# Patient Record
Sex: Female | Born: 1935 | Race: White | Hispanic: No | Smoking: Never smoker
Health system: Southern US, Community
[De-identification: ages and names within clinical notes are randomized; demographics above are authoritative.]

## PROBLEM LIST (undated history)

## (undated) DIAGNOSIS — E669 Obesity, unspecified: Secondary | ICD-10-CM

## (undated) DIAGNOSIS — N189 Chronic kidney disease, unspecified: Secondary | ICD-10-CM

## (undated) DIAGNOSIS — I2510 Atherosclerotic heart disease of native coronary artery without angina pectoris: Secondary | ICD-10-CM

## (undated) DIAGNOSIS — I509 Heart failure, unspecified: Secondary | ICD-10-CM

## (undated) DIAGNOSIS — K922 Gastrointestinal hemorrhage, unspecified: Secondary | ICD-10-CM

## (undated) DIAGNOSIS — I480 Paroxysmal atrial fibrillation: Secondary | ICD-10-CM

## (undated) DIAGNOSIS — G8929 Other chronic pain: Secondary | ICD-10-CM

## (undated) DIAGNOSIS — Z6841 Body Mass Index (BMI) 40.0 and over, adult: Secondary | ICD-10-CM

## (undated) DIAGNOSIS — I739 Peripheral vascular disease, unspecified: Secondary | ICD-10-CM

## (undated) DIAGNOSIS — F329 Major depressive disorder, single episode, unspecified: Secondary | ICD-10-CM

## (undated) DIAGNOSIS — G4733 Obstructive sleep apnea (adult) (pediatric): Secondary | ICD-10-CM

## (undated) DIAGNOSIS — M1990 Unspecified osteoarthritis, unspecified site: Secondary | ICD-10-CM

## (undated) DIAGNOSIS — I350 Nonrheumatic aortic (valve) stenosis: Secondary | ICD-10-CM

## (undated) DIAGNOSIS — I4892 Unspecified atrial flutter: Secondary | ICD-10-CM

## (undated) DIAGNOSIS — I447 Left bundle-branch block, unspecified: Secondary | ICD-10-CM

## (undated) DIAGNOSIS — G309 Alzheimer's disease, unspecified: Secondary | ICD-10-CM

## (undated) DIAGNOSIS — E1149 Type 2 diabetes mellitus with other diabetic neurological complication: Secondary | ICD-10-CM

## (undated) DIAGNOSIS — I10 Essential (primary) hypertension: Secondary | ICD-10-CM

## (undated) DIAGNOSIS — F0280 Dementia in other diseases classified elsewhere without behavioral disturbance: Secondary | ICD-10-CM

## (undated) DIAGNOSIS — L659 Nonscarring hair loss, unspecified: Secondary | ICD-10-CM

## (undated) DIAGNOSIS — I495 Sick sinus syndrome: Secondary | ICD-10-CM

## (undated) DIAGNOSIS — I5032 Chronic diastolic (congestive) heart failure: Secondary | ICD-10-CM

## (undated) DIAGNOSIS — H9201 Otalgia, right ear: Secondary | ICD-10-CM

## (undated) DIAGNOSIS — E7800 Pure hypercholesterolemia, unspecified: Secondary | ICD-10-CM

## (undated) DIAGNOSIS — D631 Anemia in chronic kidney disease: Secondary | ICD-10-CM

## (undated) DIAGNOSIS — I129 Hypertensive chronic kidney disease with stage 1 through stage 4 chronic kidney disease, or unspecified chronic kidney disease: Secondary | ICD-10-CM

## (undated) DIAGNOSIS — D649 Anemia, unspecified: Secondary | ICD-10-CM

## (undated) HISTORY — DX: Hypertensive chronic kidney disease with stage 1 through stage 4 chronic kidney disease, or unspecified chronic kidney disease: I12.9

## (undated) HISTORY — DX: Anemia in chronic kidney disease: N18.9

## (undated) HISTORY — DX: Left bundle-branch block, unspecified: I44.7

## (undated) HISTORY — DX: Alzheimer's disease, unspecified: G30.9

## (undated) HISTORY — PX: APPENDECTOMY: SHX54

## (undated) HISTORY — DX: Body Mass Index (BMI) 40.0 and over, adult: Z684

## (undated) HISTORY — DX: Obstructive sleep apnea (adult) (pediatric): G47.33

## (undated) HISTORY — DX: Chronic kidney disease, unspecified: D63.1

## (undated) HISTORY — DX: Pure hypercholesterolemia, unspecified: E78.00

## (undated) HISTORY — DX: Morbid (severe) obesity due to excess calories: E66.01

## (undated) HISTORY — DX: Essential (primary) hypertension: I10

## (undated) HISTORY — PX: REPLACEMENT TOTAL KNEE BILATERAL: SUR1225

## (undated) HISTORY — DX: Sick sinus syndrome: I49.5

## (undated) HISTORY — DX: Unspecified atrial flutter: I48.92

## (undated) HISTORY — DX: Atherosclerotic heart disease of native coronary artery without angina pectoris: I25.10

## (undated) HISTORY — DX: Heart failure, unspecified: I50.9

## (undated) HISTORY — DX: Chronic diastolic (congestive) heart failure: I50.32

## (undated) HISTORY — DX: Anemia, unspecified: D64.9

## (undated) HISTORY — DX: Obesity, unspecified: E66.9

## (undated) HISTORY — DX: Nonrheumatic aortic (valve) stenosis: I35.0

## (undated) HISTORY — DX: Paroxysmal atrial fibrillation: I48.0

## (undated) HISTORY — DX: Major depressive disorder, single episode, unspecified: F32.9

## (undated) HISTORY — DX: Gastrointestinal hemorrhage, unspecified: K92.2

## (undated) HISTORY — DX: Nonscarring hair loss, unspecified: L65.9

## (undated) HISTORY — DX: Dementia in other diseases classified elsewhere, unspecified severity, without behavioral disturbance, psychotic disturbance, mood disturbance, and anxiety: F02.80

## (undated) HISTORY — DX: Chronic kidney disease, unspecified: N18.9

## (undated) HISTORY — DX: Type 2 diabetes mellitus with other diabetic neurological complication: E11.49

---

## 1997-09-27 HISTORY — PX: CHOLECYSTECTOMY: SHX55

## 1998-02-12 ENCOUNTER — Ambulatory Visit (HOSPITAL_COMMUNITY): Admission: RE | Admit: 1998-02-12 | Discharge: 1998-02-12 | Payer: Self-pay | Admitting: Family Medicine

## 1998-03-10 ENCOUNTER — Other Ambulatory Visit
Admission: RE | Admit: 1998-03-10 | Discharge: 1998-03-10 | Payer: Self-pay | Admitting: Physical Medicine and Rehabilitation

## 1998-03-12 ENCOUNTER — Inpatient Hospital Stay (HOSPITAL_COMMUNITY): Admission: RE | Admit: 1998-03-12 | Discharge: 1998-03-15 | Payer: Self-pay | Admitting: Orthopedic Surgery

## 1998-03-15 ENCOUNTER — Inpatient Hospital Stay (HOSPITAL_COMMUNITY)
Admission: AD | Admit: 1998-03-15 | Discharge: 1998-03-21 | Payer: Self-pay | Admitting: Physical Medicine and Rehabilitation

## 1999-02-24 ENCOUNTER — Other Ambulatory Visit: Admission: RE | Admit: 1999-02-24 | Discharge: 1999-02-24 | Payer: Self-pay | Admitting: Family Medicine

## 1999-10-29 ENCOUNTER — Encounter: Payer: Self-pay | Admitting: Family Medicine

## 1999-10-29 ENCOUNTER — Encounter: Admission: RE | Admit: 1999-10-29 | Discharge: 1999-10-29 | Payer: Self-pay | Admitting: Family Medicine

## 2000-09-27 HISTORY — PX: CATARACT EXTRACTION: SUR2

## 2000-11-08 ENCOUNTER — Encounter: Admission: RE | Admit: 2000-11-08 | Discharge: 2000-11-08 | Payer: Self-pay | Admitting: Family Medicine

## 2000-11-08 ENCOUNTER — Encounter: Payer: Self-pay | Admitting: Family Medicine

## 2001-11-08 ENCOUNTER — Encounter: Admission: RE | Admit: 2001-11-08 | Discharge: 2001-11-08 | Payer: Self-pay | Admitting: Family Medicine

## 2001-11-08 ENCOUNTER — Encounter: Payer: Self-pay | Admitting: Family Medicine

## 2001-12-04 ENCOUNTER — Other Ambulatory Visit: Admission: RE | Admit: 2001-12-04 | Discharge: 2001-12-04 | Payer: Self-pay | Admitting: Family Medicine

## 2002-03-28 ENCOUNTER — Encounter: Payer: Self-pay | Admitting: Family Medicine

## 2002-03-28 ENCOUNTER — Encounter: Admission: RE | Admit: 2002-03-28 | Discharge: 2002-03-28 | Payer: Self-pay | Admitting: Family Medicine

## 2002-11-20 ENCOUNTER — Encounter: Payer: Self-pay | Admitting: Family Medicine

## 2002-11-20 ENCOUNTER — Encounter: Admission: RE | Admit: 2002-11-20 | Discharge: 2002-11-20 | Payer: Self-pay | Admitting: Family Medicine

## 2002-11-28 ENCOUNTER — Encounter: Payer: Self-pay | Admitting: Family Medicine

## 2002-11-28 ENCOUNTER — Encounter: Admission: RE | Admit: 2002-11-28 | Discharge: 2002-11-28 | Payer: Self-pay | Admitting: Family Medicine

## 2003-05-15 ENCOUNTER — Encounter: Payer: Self-pay | Admitting: Family Medicine

## 2003-05-15 ENCOUNTER — Encounter: Admission: RE | Admit: 2003-05-15 | Discharge: 2003-05-15 | Payer: Self-pay | Admitting: Family Medicine

## 2003-07-10 ENCOUNTER — Encounter: Payer: Self-pay | Admitting: Otolaryngology

## 2003-07-10 ENCOUNTER — Encounter: Admission: RE | Admit: 2003-07-10 | Discharge: 2003-07-10 | Payer: Self-pay | Admitting: Otolaryngology

## 2003-08-06 ENCOUNTER — Encounter: Admission: RE | Admit: 2003-08-06 | Discharge: 2003-08-06 | Payer: Self-pay | Admitting: Otolaryngology

## 2003-12-03 ENCOUNTER — Encounter: Admission: RE | Admit: 2003-12-03 | Discharge: 2003-12-03 | Payer: Self-pay | Admitting: Family Medicine

## 2004-01-30 ENCOUNTER — Ambulatory Visit (HOSPITAL_COMMUNITY): Admission: RE | Admit: 2004-01-30 | Discharge: 2004-01-30 | Payer: Self-pay | Admitting: Orthopedic Surgery

## 2004-02-03 ENCOUNTER — Inpatient Hospital Stay (HOSPITAL_COMMUNITY): Admission: RE | Admit: 2004-02-03 | Discharge: 2004-02-08 | Payer: Self-pay | Admitting: Orthopedic Surgery

## 2004-03-06 ENCOUNTER — Ambulatory Visit (HOSPITAL_COMMUNITY): Admission: RE | Admit: 2004-03-06 | Discharge: 2004-03-06 | Payer: Self-pay | Admitting: Orthopedic Surgery

## 2004-03-06 ENCOUNTER — Emergency Department (HOSPITAL_COMMUNITY): Admission: EM | Admit: 2004-03-06 | Discharge: 2004-03-07 | Payer: Self-pay | Admitting: Emergency Medicine

## 2004-03-06 ENCOUNTER — Ambulatory Visit (HOSPITAL_BASED_OUTPATIENT_CLINIC_OR_DEPARTMENT_OTHER): Admission: RE | Admit: 2004-03-06 | Discharge: 2004-03-06 | Payer: Self-pay | Admitting: Orthopedic Surgery

## 2004-12-08 ENCOUNTER — Encounter: Admission: RE | Admit: 2004-12-08 | Discharge: 2004-12-08 | Payer: Self-pay | Admitting: Family Medicine

## 2005-06-09 ENCOUNTER — Ambulatory Visit (HOSPITAL_COMMUNITY): Admission: RE | Admit: 2005-06-09 | Discharge: 2005-06-09 | Payer: Self-pay | Admitting: Gastroenterology

## 2005-06-09 ENCOUNTER — Encounter (INDEPENDENT_AMBULATORY_CARE_PROVIDER_SITE_OTHER): Payer: Self-pay | Admitting: *Deleted

## 2005-08-04 ENCOUNTER — Ambulatory Visit (HOSPITAL_COMMUNITY): Admission: RE | Admit: 2005-08-04 | Discharge: 2005-08-04 | Payer: Self-pay | Admitting: Gastroenterology

## 2005-09-27 HISTORY — PX: CLOSED REDUCTION FEMORAL SHAFT FRACTURE: SUR217

## 2005-11-08 ENCOUNTER — Ambulatory Visit: Payer: Self-pay | Admitting: Hematology & Oncology

## 2005-12-03 ENCOUNTER — Encounter: Admission: RE | Admit: 2005-12-03 | Discharge: 2005-12-03 | Payer: Self-pay | Admitting: Orthopedic Surgery

## 2005-12-05 ENCOUNTER — Encounter: Admission: RE | Admit: 2005-12-05 | Discharge: 2005-12-05 | Payer: Self-pay | Admitting: Orthopedic Surgery

## 2005-12-13 ENCOUNTER — Encounter: Admission: RE | Admit: 2005-12-13 | Discharge: 2005-12-13 | Payer: Self-pay | Admitting: Family Medicine

## 2005-12-24 ENCOUNTER — Ambulatory Visit: Payer: Self-pay | Admitting: Hematology & Oncology

## 2006-01-20 LAB — CBC & DIFF AND RETIC
BASO%: 0.8 % (ref 0.0–2.0)
EOS%: 5.3 % (ref 0.0–7.0)
HCT: 33.9 % — ABNORMAL LOW (ref 34.8–46.6)
LYMPH%: 29.1 % (ref 14.0–48.0)
MCH: 29.5 pg (ref 26.0–34.0)
MCHC: 33.8 g/dL (ref 32.0–36.0)
NEUT%: 52.6 % (ref 39.6–76.8)
Platelets: 182 10*3/uL (ref 145–400)
lymph#: 1.6 10*3/uL (ref 0.9–3.3)

## 2006-01-20 LAB — FERRITIN: Ferritin: 65 ng/mL (ref 10–291)

## 2006-01-26 ENCOUNTER — Encounter: Admission: RE | Admit: 2006-01-26 | Discharge: 2006-01-26 | Payer: Self-pay | Admitting: Family Medicine

## 2006-02-05 ENCOUNTER — Emergency Department (HOSPITAL_COMMUNITY): Admission: EM | Admit: 2006-02-05 | Discharge: 2006-02-05 | Payer: Self-pay | Admitting: Family Medicine

## 2006-02-15 ENCOUNTER — Ambulatory Visit: Payer: Self-pay | Admitting: Hematology & Oncology

## 2006-02-17 LAB — CBC & DIFF AND RETIC
Eosinophils Absolute: 0.2 10*3/uL (ref 0.0–0.5)
MCV: 87.7 fL (ref 81.0–101.0)
MONO%: 11.9 % (ref 0.0–13.0)
NEUT#: 2.5 10*3/uL (ref 1.5–6.5)
RBC: 3.86 10*6/uL (ref 3.70–5.32)
RDW: 15.5 % — ABNORMAL HIGH (ref 11.3–14.5)
RETIC #: 70.3 10*3/uL (ref 19.7–115.1)
Retic %: 1.8 % (ref 0.4–2.3)
WBC: 4.8 10*3/uL (ref 3.9–10.0)
lymph#: 1.5 10*3/uL (ref 0.9–3.3)

## 2006-03-17 LAB — CBC WITH DIFFERENTIAL/PLATELET
BASO%: 0.6 % (ref 0.0–2.0)
Basophils Absolute: 0 10*3/uL (ref 0.0–0.1)
EOS%: 5.1 % (ref 0.0–7.0)
MCH: 30.1 pg (ref 26.0–34.0)
MCHC: 34 g/dL (ref 32.0–36.0)
MCV: 88.4 fL (ref 81.0–101.0)
MONO%: 11.4 % (ref 0.0–13.0)
RBC: 3.95 10*6/uL (ref 3.70–5.32)
RDW: 16.3 % — ABNORMAL HIGH (ref 11.3–14.5)

## 2006-03-27 ENCOUNTER — Inpatient Hospital Stay (HOSPITAL_COMMUNITY): Admission: EM | Admit: 2006-03-27 | Discharge: 2006-04-14 | Payer: Self-pay | Admitting: Emergency Medicine

## 2006-03-28 ENCOUNTER — Encounter (INDEPENDENT_AMBULATORY_CARE_PROVIDER_SITE_OTHER): Payer: Self-pay | Admitting: Cardiology

## 2006-04-01 ENCOUNTER — Encounter (HOSPITAL_BASED_OUTPATIENT_CLINIC_OR_DEPARTMENT_OTHER): Payer: Self-pay | Admitting: General Surgery

## 2006-04-04 ENCOUNTER — Ambulatory Visit: Payer: Self-pay | Admitting: Physical Medicine & Rehabilitation

## 2006-04-11 ENCOUNTER — Ambulatory Visit: Payer: Self-pay | Admitting: Hematology & Oncology

## 2006-06-02 ENCOUNTER — Ambulatory Visit: Payer: Self-pay | Admitting: Hematology & Oncology

## 2006-06-06 LAB — CBC WITH DIFFERENTIAL/PLATELET
EOS%: 3.4 % (ref 0.0–7.0)
Eosinophils Absolute: 0.1 10*3/uL (ref 0.0–0.5)
MCV: 89.7 fL (ref 81.0–101.0)
MONO%: 13.4 % — ABNORMAL HIGH (ref 0.0–13.0)
NEUT#: 2.2 10*3/uL (ref 1.5–6.5)
RBC: 2.89 10*6/uL — ABNORMAL LOW (ref 3.70–5.32)
RDW: 17.8 % — ABNORMAL HIGH (ref 11.3–14.5)
lymph#: 1.4 10*3/uL (ref 0.9–3.3)

## 2006-06-08 LAB — TRANSFERRIN RECEPTOR, SOLUABLE: Transferrin Receptor, Soluble: 7.9 mg/L — ABNORMAL HIGH (ref 1.9–4.4)

## 2006-06-08 LAB — FERRITIN: Ferritin: 66 ng/mL (ref 10–291)

## 2006-06-13 LAB — CBC WITH DIFFERENTIAL/PLATELET
BASO%: 1.6 % (ref 0.0–2.0)
EOS%: 4.4 % (ref 0.0–7.0)
MCH: 29.5 pg (ref 26.0–34.0)
MCHC: 32.2 g/dL (ref 32.0–36.0)
RDW: 16.4 % — ABNORMAL HIGH (ref 11.3–14.5)
lymph#: 1.5 10*3/uL (ref 0.9–3.3)

## 2006-06-20 LAB — CBC WITH DIFFERENTIAL/PLATELET
BASO%: 0.7 % (ref 0.0–2.0)
Basophils Absolute: 0 10*3/uL (ref 0.0–0.1)
EOS%: 2.1 % (ref 0.0–7.0)
HCT: 29 % — ABNORMAL LOW (ref 34.8–46.6)
HGB: 9.7 g/dL — ABNORMAL LOW (ref 11.6–15.9)
LYMPH%: 27.6 % (ref 14.0–48.0)
MCH: 30.8 pg (ref 26.0–34.0)
MCHC: 33.5 g/dL (ref 32.0–36.0)
MCV: 91.9 fL (ref 81.0–101.0)
MONO%: 14.8 % — ABNORMAL HIGH (ref 0.0–13.0)
NEUT%: 54.8 % (ref 39.6–76.8)

## 2006-08-01 ENCOUNTER — Ambulatory Visit: Payer: Self-pay | Admitting: Hematology & Oncology

## 2006-08-03 LAB — CBC WITH DIFFERENTIAL/PLATELET
BASO%: 1.6 % (ref 0.0–2.0)
Basophils Absolute: 0.1 10*3/uL (ref 0.0–0.1)
EOS%: 3.4 % (ref 0.0–7.0)
HGB: 11.2 g/dL — ABNORMAL LOW (ref 11.6–15.9)
MCH: 29 pg (ref 26.0–34.0)
MCHC: 33.4 g/dL (ref 32.0–36.0)
MCV: 87 fL (ref 81.0–101.0)
MONO%: 11.9 % (ref 0.0–13.0)
RBC: 3.88 10*6/uL (ref 3.70–5.32)
RDW: 14.1 % (ref 11.3–14.5)

## 2006-08-31 LAB — CBC WITH DIFFERENTIAL/PLATELET
BASO%: 0.6 % (ref 0.0–2.0)
Basophils Absolute: 0 10*3/uL (ref 0.0–0.1)
Eosinophils Absolute: 0.1 10*3/uL (ref 0.0–0.5)
HCT: 33.1 % — ABNORMAL LOW (ref 34.8–46.6)
HGB: 11.2 g/dL — ABNORMAL LOW (ref 11.6–15.9)
LYMPH%: 29.6 % (ref 14.0–48.0)
MONO#: 0.5 10*3/uL (ref 0.1–0.9)
NEUT#: 2.8 10*3/uL (ref 1.5–6.5)
NEUT%: 57.5 % (ref 39.6–76.8)
Platelets: 189 10*3/uL (ref 145–400)
WBC: 4.9 10*3/uL (ref 3.9–10.0)
lymph#: 1.4 10*3/uL (ref 0.9–3.3)

## 2006-09-22 ENCOUNTER — Ambulatory Visit: Payer: Self-pay | Admitting: Hematology & Oncology

## 2006-09-28 LAB — CBC WITH DIFFERENTIAL/PLATELET
Basophils Absolute: 0.1 10*3/uL (ref 0.0–0.1)
Eosinophils Absolute: 0.2 10*3/uL (ref 0.0–0.5)
HGB: 10.9 g/dL — ABNORMAL LOW (ref 11.6–15.9)
MONO#: 0.5 10*3/uL (ref 0.1–0.9)
NEUT#: 2.1 10*3/uL (ref 1.5–6.5)
RDW: 16.6 % — ABNORMAL HIGH (ref 11.3–14.5)
lymph#: 1.6 10*3/uL (ref 0.9–3.3)

## 2006-09-30 LAB — TRANSFERRIN RECEPTOR, SOLUABLE: Transferrin Receptor, Soluble: 33.8 nmol/L

## 2006-10-26 LAB — CBC WITH DIFFERENTIAL/PLATELET
BASO%: 0.8 % (ref 0.0–2.0)
HCT: 34.9 % (ref 34.8–46.6)
HGB: 11.7 g/dL (ref 11.6–15.9)
MCHC: 33.6 g/dL (ref 32.0–36.0)
MONO#: 0.7 10*3/uL (ref 0.1–0.9)
NEUT#: 2.8 10*3/uL (ref 1.5–6.5)
NEUT%: 52.2 % (ref 39.6–76.8)
WBC: 5.4 10*3/uL (ref 3.9–10.0)
lymph#: 1.8 10*3/uL (ref 0.9–3.3)

## 2006-11-07 ENCOUNTER — Ambulatory Visit: Payer: Self-pay | Admitting: Hematology & Oncology

## 2006-11-09 LAB — CBC WITH DIFFERENTIAL/PLATELET
Basophils Absolute: 0.1 10*3/uL (ref 0.0–0.1)
EOS%: 2.9 % (ref 0.0–7.0)
HCT: 36.5 % (ref 34.8–46.6)
HGB: 12.2 g/dL (ref 11.6–15.9)
MCH: 30.5 pg (ref 26.0–34.0)
MONO#: 0.5 10*3/uL (ref 0.1–0.9)
NEUT%: 60.9 % (ref 39.6–76.8)
lymph#: 1.3 10*3/uL (ref 0.9–3.3)

## 2006-11-23 LAB — CBC WITH DIFFERENTIAL/PLATELET
BASO%: 0.7 % (ref 0.0–2.0)
EOS%: 2.5 % (ref 0.0–7.0)
Eosinophils Absolute: 0.2 10*3/uL (ref 0.0–0.5)
MCV: 88.2 fL (ref 81.0–101.0)
MONO%: 13.3 % — ABNORMAL HIGH (ref 0.0–13.0)
NEUT#: 3.5 10*3/uL (ref 1.5–6.5)
RBC: 3.67 10*6/uL — ABNORMAL LOW (ref 3.70–5.32)
RDW: 15.2 % — ABNORMAL HIGH (ref 11.3–14.5)

## 2006-12-19 ENCOUNTER — Ambulatory Visit: Payer: Self-pay | Admitting: Hematology & Oncology

## 2006-12-22 ENCOUNTER — Emergency Department (HOSPITAL_COMMUNITY): Admission: EM | Admit: 2006-12-22 | Discharge: 2006-12-22 | Payer: Self-pay | Admitting: Emergency Medicine

## 2006-12-28 LAB — CBC WITH DIFFERENTIAL/PLATELET
Eosinophils Absolute: 0.1 10*3/uL (ref 0.0–0.5)
MONO#: 0.6 10*3/uL (ref 0.1–0.9)
NEUT#: 2 10*3/uL (ref 1.5–6.5)
Platelets: 169 10*3/uL (ref 145–400)
RBC: 3.42 10*6/uL — ABNORMAL LOW (ref 3.70–5.32)
RDW: 15.3 % — ABNORMAL HIGH (ref 11.3–14.5)
WBC: 4.4 10*3/uL (ref 3.9–10.0)
lymph#: 1.6 10*3/uL (ref 0.9–3.3)

## 2006-12-29 ENCOUNTER — Encounter: Admission: RE | Admit: 2006-12-29 | Discharge: 2006-12-29 | Payer: Self-pay | Admitting: Family Medicine

## 2007-01-18 LAB — CBC WITH DIFFERENTIAL/PLATELET
Eosinophils Absolute: 0.2 10*3/uL (ref 0.0–0.5)
HCT: 30.8 % — ABNORMAL LOW (ref 34.8–46.6)
HGB: 10.6 g/dL — ABNORMAL LOW (ref 11.6–15.9)
LYMPH%: 26.8 % (ref 14.0–48.0)
MONO#: 0.4 10*3/uL (ref 0.1–0.9)
NEUT#: 2.2 10*3/uL (ref 1.5–6.5)
NEUT%: 56.6 % (ref 39.6–76.8)
Platelets: 153 10*3/uL (ref 145–400)
WBC: 3.9 10*3/uL (ref 3.9–10.0)

## 2007-02-13 ENCOUNTER — Ambulatory Visit: Payer: Self-pay | Admitting: Hematology & Oncology

## 2007-02-15 LAB — CBC & DIFF AND RETIC
BASO%: 0.8 % (ref 0.0–2.0)
Basophils Absolute: 0 10*3/uL (ref 0.0–0.1)
HCT: 30.1 % — ABNORMAL LOW (ref 34.8–46.6)
HGB: 10.5 g/dL — ABNORMAL LOW (ref 11.6–15.9)
LYMPH%: 27.9 % (ref 14.0–48.0)
MCH: 30.7 pg (ref 26.0–34.0)
MCHC: 34.9 g/dL (ref 32.0–36.0)
MONO#: 0.4 10*3/uL (ref 0.1–0.9)
NEUT%: 57.5 % (ref 39.6–76.8)
Platelets: 142 10*3/uL — ABNORMAL LOW (ref 145–400)

## 2007-02-15 LAB — FERRITIN: Ferritin: 340 ng/mL — ABNORMAL HIGH (ref 10–291)

## 2007-03-15 LAB — CBC WITH DIFFERENTIAL/PLATELET
Basophils Absolute: 0 10*3/uL (ref 0.0–0.1)
Eosinophils Absolute: 0.1 10*3/uL (ref 0.0–0.5)
HGB: 9.5 g/dL — ABNORMAL LOW (ref 11.6–15.9)
MCV: 88.3 fL (ref 81.0–101.0)
MONO#: 0.5 10*3/uL (ref 0.1–0.9)
MONO%: 12.9 % (ref 0.0–13.0)
NEUT#: 2.1 10*3/uL (ref 1.5–6.5)
RBC: 3.12 10*6/uL — ABNORMAL LOW (ref 3.70–5.32)
RDW: 15.6 % — ABNORMAL HIGH (ref 11.3–14.5)
WBC: 3.7 10*3/uL — ABNORMAL LOW (ref 3.9–10.0)
lymph#: 1 10*3/uL (ref 0.9–3.3)

## 2007-04-08 ENCOUNTER — Encounter: Admission: RE | Admit: 2007-04-08 | Discharge: 2007-04-08 | Payer: Self-pay | Admitting: Family Medicine

## 2007-04-10 ENCOUNTER — Ambulatory Visit: Payer: Self-pay | Admitting: Hematology & Oncology

## 2007-04-12 LAB — CBC & DIFF AND RETIC
Basophils Absolute: 0 10*3/uL (ref 0.0–0.1)
Eosinophils Absolute: 0.1 10*3/uL (ref 0.0–0.5)
HCT: 26.9 % — ABNORMAL LOW (ref 34.8–46.6)
HGB: 9.3 g/dL — ABNORMAL LOW (ref 11.6–15.9)
LYMPH%: 23.4 % (ref 14.0–48.0)
MCV: 87.5 fL (ref 81.0–101.0)
MONO#: 0.4 10*3/uL (ref 0.1–0.9)
MONO%: 9.6 % (ref 0.0–13.0)
NEUT#: 2.4 10*3/uL (ref 1.5–6.5)
Platelets: 138 10*3/uL — ABNORMAL LOW (ref 145–400)
RBC: 3.08 10*6/uL — ABNORMAL LOW (ref 3.70–5.32)
Retic %: 2.5 % — ABNORMAL HIGH (ref 0.4–2.3)
WBC: 3.8 10*3/uL — ABNORMAL LOW (ref 3.9–10.0)

## 2007-04-12 LAB — CHCC SMEAR

## 2007-04-14 LAB — TRANSFERRIN RECEPTOR, SOLUABLE: Transferrin Receptor, Soluble: 46.8 nmol/L

## 2007-04-14 LAB — FERRITIN: Ferritin: 289 ng/mL (ref 10–291)

## 2007-05-10 LAB — CBC WITH DIFFERENTIAL/PLATELET
Basophils Absolute: 0 10*3/uL (ref 0.0–0.1)
Eosinophils Absolute: 0.1 10*3/uL (ref 0.0–0.5)
HGB: 10.9 g/dL — ABNORMAL LOW (ref 11.6–15.9)
LYMPH%: 23.2 % (ref 14.0–48.0)
MCV: 86.9 fL (ref 81.0–101.0)
MONO#: 0.3 10*3/uL (ref 0.1–0.9)
MONO%: 10.2 % (ref 0.0–13.0)
NEUT#: 1.9 10*3/uL (ref 1.5–6.5)
Platelets: 135 10*3/uL — ABNORMAL LOW (ref 145–400)
RBC: 3.7 10*6/uL (ref 3.70–5.32)
WBC: 3 10*3/uL — ABNORMAL LOW (ref 3.9–10.0)

## 2007-06-05 ENCOUNTER — Ambulatory Visit: Payer: Self-pay | Admitting: Hematology & Oncology

## 2007-06-06 LAB — CBC WITH DIFFERENTIAL/PLATELET
Eosinophils Absolute: 0.1 10*3/uL (ref 0.0–0.5)
HCT: 36.4 % (ref 34.8–46.6)
LYMPH%: 25.3 % (ref 14.0–48.0)
MCHC: 34.7 g/dL (ref 32.0–36.0)
MCV: 85.5 fL (ref 81.0–101.0)
MONO#: 0.5 10*3/uL (ref 0.1–0.9)
MONO%: 10.9 % (ref 0.0–13.0)
NEUT#: 2.5 10*3/uL (ref 1.5–6.5)
NEUT%: 60.8 % (ref 39.6–76.8)
Platelets: 136 10*3/uL — ABNORMAL LOW (ref 145–400)
RBC: 4.26 10*6/uL (ref 3.70–5.32)
WBC: 4.2 10*3/uL (ref 3.9–10.0)

## 2007-07-10 LAB — CBC WITH DIFFERENTIAL/PLATELET
BASO%: 0.8 % (ref 0.0–2.0)
Basophils Absolute: 0 10*3/uL (ref 0.0–0.1)
EOS%: 2.2 % (ref 0.0–7.0)
HCT: 33 % — ABNORMAL LOW (ref 34.8–46.6)
HGB: 11.6 g/dL (ref 11.6–15.9)
LYMPH%: 24.9 % (ref 14.0–48.0)
MCH: 30 pg (ref 26.0–34.0)
MCHC: 35.1 g/dL (ref 32.0–36.0)
MCV: 85.5 fL (ref 81.0–101.0)
MONO%: 11.5 % (ref 0.0–13.0)
NEUT%: 60.6 % (ref 39.6–76.8)

## 2007-07-27 ENCOUNTER — Ambulatory Visit: Payer: Self-pay | Admitting: Hematology & Oncology

## 2007-07-31 LAB — CBC WITH DIFFERENTIAL/PLATELET
BASO%: 1.1 % (ref 0.0–2.0)
Basophils Absolute: 0 10*3/uL (ref 0.0–0.1)
EOS%: 2 % (ref 0.0–7.0)
MCH: 30.2 pg (ref 26.0–34.0)
MCHC: 35 g/dL (ref 32.0–36.0)
MCV: 86.3 fL (ref 81.0–101.0)
MONO%: 11.6 % (ref 0.0–13.0)
RBC: 3.95 10*6/uL (ref 3.70–5.32)
RDW: 15.5 % — ABNORMAL HIGH (ref 11.3–14.5)

## 2007-07-31 LAB — FERRITIN: Ferritin: 653 ng/mL — ABNORMAL HIGH (ref 10–291)

## 2007-08-30 LAB — CBC WITH DIFFERENTIAL/PLATELET
BASO%: 0.5 % (ref 0.0–2.0)
LYMPH%: 24.9 % (ref 14.0–48.0)
MCHC: 35.5 g/dL (ref 32.0–36.0)
MONO#: 0.5 10*3/uL (ref 0.1–0.9)
Platelets: 137 10*3/uL — ABNORMAL LOW (ref 145–400)
RBC: 3.69 10*6/uL — ABNORMAL LOW (ref 3.70–5.32)
RDW: 14.8 % — ABNORMAL HIGH (ref 11.3–14.5)
WBC: 4.6 10*3/uL (ref 3.9–10.0)

## 2007-09-25 ENCOUNTER — Ambulatory Visit: Payer: Self-pay | Admitting: Hematology & Oncology

## 2007-09-27 LAB — CBC WITH DIFFERENTIAL/PLATELET
BASO%: 1.5 % (ref 0.0–2.0)
HCT: 34.7 % — ABNORMAL LOW (ref 34.8–46.6)
MCHC: 34.6 g/dL (ref 32.0–36.0)
MONO#: 0.3 10*3/uL (ref 0.1–0.9)
NEUT%: 65.3 % (ref 39.6–76.8)
RBC: 3.95 10*6/uL (ref 3.70–5.32)
WBC: 4.2 10*3/uL (ref 3.9–10.0)
lymph#: 1 10*3/uL (ref 0.9–3.3)

## 2007-10-18 LAB — CBC WITH DIFFERENTIAL/PLATELET
Basophils Absolute: 0 10*3/uL (ref 0.0–0.1)
Eosinophils Absolute: 0.1 10*3/uL (ref 0.0–0.5)
HCT: 34.2 % — ABNORMAL LOW (ref 34.8–46.6)
HGB: 11.8 g/dL (ref 11.6–15.9)
MONO#: 0.4 10*3/uL (ref 0.1–0.9)
NEUT%: 66.5 % (ref 39.6–76.8)
WBC: 4.7 10*3/uL (ref 3.9–10.0)
lymph#: 1.1 10*3/uL (ref 0.9–3.3)

## 2007-11-20 ENCOUNTER — Ambulatory Visit: Payer: Self-pay | Admitting: Hematology & Oncology

## 2007-11-22 LAB — CBC WITH DIFFERENTIAL/PLATELET
Basophils Absolute: 0 10*3/uL (ref 0.0–0.1)
Eosinophils Absolute: 0.1 10*3/uL (ref 0.0–0.5)
HGB: 11.4 g/dL — ABNORMAL LOW (ref 11.6–15.9)
MCV: 87.6 fL (ref 81.0–101.0)
MONO#: 0.4 10*3/uL (ref 0.1–0.9)
MONO%: 8.1 % (ref 0.0–13.0)
NEUT#: 2.9 10*3/uL (ref 1.5–6.5)
RBC: 3.72 10*6/uL (ref 3.70–5.32)
RDW: 14.4 % (ref 11.3–14.5)
WBC: 4.4 10*3/uL (ref 3.9–10.0)
lymph#: 1 10*3/uL (ref 0.9–3.3)

## 2007-11-22 LAB — FERRITIN: Ferritin: 586 ng/mL — ABNORMAL HIGH (ref 10–291)

## 2007-12-20 LAB — CBC WITH DIFFERENTIAL/PLATELET
Basophils Absolute: 0.1 10*3/uL (ref 0.0–0.1)
EOS%: 2.7 % (ref 0.0–7.0)
HGB: 11 g/dL — ABNORMAL LOW (ref 11.6–15.9)
MCH: 30.8 pg (ref 26.0–34.0)
NEUT#: 2.6 10*3/uL (ref 1.5–6.5)
RBC: 3.58 10*6/uL — ABNORMAL LOW (ref 3.70–5.32)
RDW: 14.5 % (ref 11.3–14.5)
lymph#: 1.1 10*3/uL (ref 0.9–3.3)

## 2008-01-15 ENCOUNTER — Ambulatory Visit: Payer: Self-pay | Admitting: Hematology & Oncology

## 2008-01-17 LAB — CBC WITH DIFFERENTIAL/PLATELET
BASO%: 0 % (ref 0.0–2.0)
EOS%: 1.2 % (ref 0.0–7.0)
MCH: 30.8 pg (ref 26.0–34.0)
MCHC: 35.6 g/dL (ref 32.0–36.0)
MCV: 86.5 fL (ref 81.0–101.0)
MONO%: 10.2 % (ref 0.0–13.0)
NEUT%: 63.4 % (ref 39.6–76.8)
RDW: 14 % (ref 11.3–14.5)
lymph#: 1.3 10*3/uL (ref 0.9–3.3)

## 2008-02-21 ENCOUNTER — Encounter: Admission: RE | Admit: 2008-02-21 | Discharge: 2008-02-21 | Payer: Self-pay | Admitting: Family Medicine

## 2008-03-11 ENCOUNTER — Ambulatory Visit: Payer: Self-pay | Admitting: Hematology & Oncology

## 2008-04-10 LAB — CBC WITH DIFFERENTIAL/PLATELET
BASO%: 0.8 % (ref 0.0–2.0)
EOS%: 2.1 % (ref 0.0–7.0)
HCT: 34.1 % — ABNORMAL LOW (ref 34.8–46.6)
LYMPH%: 27.6 % (ref 14.0–48.0)
MCH: 30.3 pg (ref 26.0–34.0)
MCHC: 35.2 g/dL (ref 32.0–36.0)
MCV: 86.1 fL (ref 81.0–101.0)
MONO%: 10.9 % (ref 0.0–13.0)
NEUT%: 58.6 % (ref 39.6–76.8)
Platelets: 153 10*3/uL (ref 145–400)

## 2008-05-06 ENCOUNTER — Ambulatory Visit: Payer: Self-pay | Admitting: Hematology & Oncology

## 2008-07-09 ENCOUNTER — Ambulatory Visit: Payer: Self-pay | Admitting: Hematology & Oncology

## 2008-07-10 LAB — CBC WITH DIFFERENTIAL (CANCER CENTER ONLY)
BASO#: 0 10*3/uL (ref 0.0–0.2)
Eosinophils Absolute: 0.1 10*3/uL (ref 0.0–0.5)
HGB: 11.3 g/dL — ABNORMAL LOW (ref 11.6–15.9)
MCH: 29.5 pg (ref 26.0–34.0)
MONO%: 8.9 % (ref 0.0–13.0)
NEUT#: 2.6 10*3/uL (ref 1.5–6.5)
RBC: 3.82 10*6/uL (ref 3.70–5.32)

## 2008-07-10 LAB — FERRITIN: Ferritin: 583 ng/mL — ABNORMAL HIGH (ref 10–291)

## 2008-07-31 ENCOUNTER — Ambulatory Visit: Payer: Self-pay | Admitting: Hematology & Oncology

## 2008-07-31 LAB — CBC WITH DIFFERENTIAL/PLATELET
Basophils Absolute: 0 10*3/uL (ref 0.0–0.1)
Eosinophils Absolute: 0.1 10*3/uL (ref 0.0–0.5)
HGB: 11.3 g/dL — ABNORMAL LOW (ref 11.6–15.9)
LYMPH%: 26.7 % (ref 14.0–48.0)
MCV: 90.1 fL (ref 81.0–101.0)
MONO%: 11.1 % (ref 0.0–13.0)
NEUT#: 2.6 10*3/uL (ref 1.5–6.5)
Platelets: 126 10*3/uL — ABNORMAL LOW (ref 145–400)

## 2008-08-13 ENCOUNTER — Encounter: Admission: RE | Admit: 2008-08-13 | Discharge: 2008-08-13 | Payer: Self-pay | Admitting: Neurology

## 2008-08-26 ENCOUNTER — Encounter: Admission: RE | Admit: 2008-08-26 | Discharge: 2008-08-26 | Payer: Self-pay | Admitting: Cardiology

## 2008-08-26 ENCOUNTER — Inpatient Hospital Stay (HOSPITAL_COMMUNITY): Admission: AD | Admit: 2008-08-26 | Discharge: 2008-09-12 | Payer: Self-pay | Admitting: Cardiology

## 2008-08-27 ENCOUNTER — Encounter (INDEPENDENT_AMBULATORY_CARE_PROVIDER_SITE_OTHER): Payer: Self-pay | Admitting: Diagnostic Radiology

## 2008-08-27 HISTORY — PX: AORTIC VALVE REPLACEMENT: SHX41

## 2008-08-28 ENCOUNTER — Encounter: Payer: Self-pay | Admitting: Cardiothoracic Surgery

## 2008-08-28 ENCOUNTER — Ambulatory Visit: Payer: Self-pay | Admitting: Cardiothoracic Surgery

## 2008-08-29 ENCOUNTER — Encounter: Payer: Self-pay | Admitting: Cardiothoracic Surgery

## 2008-09-04 ENCOUNTER — Encounter: Payer: Self-pay | Admitting: Cardiothoracic Surgery

## 2008-09-04 DIAGNOSIS — I2510 Atherosclerotic heart disease of native coronary artery without angina pectoris: Secondary | ICD-10-CM

## 2008-09-04 DIAGNOSIS — I350 Nonrheumatic aortic (valve) stenosis: Secondary | ICD-10-CM

## 2008-09-04 HISTORY — PX: CORONARY ARTERY BYPASS GRAFT: SHX141

## 2008-09-04 HISTORY — DX: Atherosclerotic heart disease of native coronary artery without angina pectoris: I25.10

## 2008-09-04 HISTORY — DX: Nonrheumatic aortic (valve) stenosis: I35.0

## 2008-09-15 ENCOUNTER — Ambulatory Visit: Payer: Self-pay | Admitting: Cardiology

## 2008-09-16 ENCOUNTER — Inpatient Hospital Stay (HOSPITAL_COMMUNITY): Admission: EM | Admit: 2008-09-16 | Discharge: 2008-09-24 | Payer: Self-pay | Admitting: Emergency Medicine

## 2008-09-17 ENCOUNTER — Encounter (INDEPENDENT_AMBULATORY_CARE_PROVIDER_SITE_OTHER): Payer: Self-pay | Admitting: Neurology

## 2008-09-22 ENCOUNTER — Ambulatory Visit: Payer: Self-pay | Admitting: Infectious Diseases

## 2008-09-23 ENCOUNTER — Ambulatory Visit: Payer: Self-pay | Admitting: Hematology & Oncology

## 2008-09-24 ENCOUNTER — Encounter: Payer: Self-pay | Admitting: Family Medicine

## 2008-09-24 ENCOUNTER — Ambulatory Visit: Payer: Self-pay | Admitting: Family Medicine

## 2008-09-24 DIAGNOSIS — M6281 Muscle weakness (generalized): Secondary | ICD-10-CM | POA: Insufficient documentation

## 2008-09-24 DIAGNOSIS — Z952 Presence of prosthetic heart valve: Secondary | ICD-10-CM | POA: Insufficient documentation

## 2008-09-24 DIAGNOSIS — E1149 Type 2 diabetes mellitus with other diabetic neurological complication: Secondary | ICD-10-CM | POA: Insufficient documentation

## 2008-09-24 DIAGNOSIS — I4891 Unspecified atrial fibrillation: Secondary | ICD-10-CM | POA: Insufficient documentation

## 2008-09-24 DIAGNOSIS — N039 Chronic nephritic syndrome with unspecified morphologic changes: Secondary | ICD-10-CM

## 2008-09-24 DIAGNOSIS — I10 Essential (primary) hypertension: Secondary | ICD-10-CM | POA: Insufficient documentation

## 2008-09-24 DIAGNOSIS — G4733 Obstructive sleep apnea (adult) (pediatric): Secondary | ICD-10-CM | POA: Insufficient documentation

## 2008-09-24 DIAGNOSIS — F330 Major depressive disorder, recurrent, mild: Secondary | ICD-10-CM | POA: Insufficient documentation

## 2008-09-24 DIAGNOSIS — Z951 Presence of aortocoronary bypass graft: Secondary | ICD-10-CM | POA: Insufficient documentation

## 2008-09-24 DIAGNOSIS — E785 Hyperlipidemia, unspecified: Secondary | ICD-10-CM | POA: Insufficient documentation

## 2008-09-24 DIAGNOSIS — F0280 Dementia in other diseases classified elsewhere without behavioral disturbance: Secondary | ICD-10-CM | POA: Insufficient documentation

## 2008-09-24 DIAGNOSIS — E119 Type 2 diabetes mellitus without complications: Secondary | ICD-10-CM | POA: Insufficient documentation

## 2008-09-24 DIAGNOSIS — D631 Anemia in chronic kidney disease: Secondary | ICD-10-CM | POA: Insufficient documentation

## 2008-09-24 DIAGNOSIS — G309 Alzheimer's disease, unspecified: Secondary | ICD-10-CM

## 2008-10-02 ENCOUNTER — Encounter: Payer: Self-pay | Admitting: Family Medicine

## 2008-10-07 ENCOUNTER — Telehealth: Payer: Self-pay | Admitting: Family Medicine

## 2008-10-10 ENCOUNTER — Ambulatory Visit: Payer: Self-pay | Admitting: Cardiothoracic Surgery

## 2008-10-10 ENCOUNTER — Encounter: Admission: RE | Admit: 2008-10-10 | Discharge: 2008-10-10 | Payer: Self-pay | Admitting: Cardiothoracic Surgery

## 2008-10-22 ENCOUNTER — Ambulatory Visit: Payer: Self-pay | Admitting: Hematology & Oncology

## 2008-10-23 LAB — CHCC SATELLITE - SMEAR

## 2008-10-23 LAB — MANUAL DIFFERENTIAL (CHCC SATELLITE)
ANC (CHCC HP manual diff): 3.3 10*3/uL (ref 1.5–6.7)
LYMPH: 27 % (ref 14–48)

## 2008-10-23 LAB — CBC WITH DIFFERENTIAL (CANCER CENTER ONLY)
HCT: 33.1 % — ABNORMAL LOW (ref 34.8–46.6)
HGB: 11 g/dL — ABNORMAL LOW (ref 11.6–15.9)
RDW: 14.7 % — ABNORMAL HIGH (ref 10.5–14.6)
WBC: 8.9 10*3/uL (ref 3.9–10.0)

## 2008-10-25 LAB — FERRITIN: Ferritin: 764 ng/mL — ABNORMAL HIGH (ref 10–291)

## 2008-11-11 ENCOUNTER — Ambulatory Visit: Payer: Self-pay | Admitting: Hematology & Oncology

## 2008-11-13 LAB — CBC WITH DIFFERENTIAL/PLATELET
BASO%: 0.6 % (ref 0.0–2.0)
EOS%: 13.6 % — ABNORMAL HIGH (ref 0.0–7.0)
Eosinophils Absolute: 0.9 10*3/uL — ABNORMAL HIGH (ref 0.0–0.5)
LYMPH%: 33.3 % (ref 14.0–48.0)
MCH: 28.3 pg (ref 26.0–34.0)
MCHC: 33.1 g/dL (ref 32.0–36.0)
MCV: 85.4 fL (ref 81.0–101.0)
MONO%: 10.3 % (ref 0.0–13.0)
NEUT#: 2.7 10*3/uL (ref 1.5–6.5)
Platelets: 144 10*3/uL — ABNORMAL LOW (ref 145–400)
RBC: 4.53 10*6/uL (ref 3.70–5.32)
RDW: 17 % — ABNORMAL HIGH (ref 11.3–14.5)

## 2008-11-26 ENCOUNTER — Ambulatory Visit: Payer: Self-pay | Admitting: Vascular Surgery

## 2008-11-26 ENCOUNTER — Ambulatory Visit (HOSPITAL_COMMUNITY): Admission: RE | Admit: 2008-11-26 | Discharge: 2008-11-26 | Payer: Self-pay | Admitting: Orthopedic Surgery

## 2008-11-26 ENCOUNTER — Encounter (INDEPENDENT_AMBULATORY_CARE_PROVIDER_SITE_OTHER): Payer: Self-pay | Admitting: Orthopedic Surgery

## 2008-12-04 ENCOUNTER — Ambulatory Visit: Payer: Self-pay | Admitting: Hematology & Oncology

## 2008-12-04 LAB — CBC WITH DIFFERENTIAL (CANCER CENTER ONLY)
BASO%: 0.5 % (ref 0.0–2.0)
LYMPH#: 2.5 10*3/uL (ref 0.9–3.3)
MONO#: 0.4 10*3/uL (ref 0.1–0.9)
NEUT#: 2.9 10*3/uL (ref 1.5–6.5)
Platelets: 140 10*3/uL — ABNORMAL LOW (ref 145–400)
RDW: 14.3 % (ref 10.5–14.6)
WBC: 6.7 10*3/uL (ref 3.9–10.0)

## 2008-12-04 LAB — CHCC SATELLITE - SMEAR

## 2008-12-06 LAB — FERRITIN: Ferritin: 750 ng/mL — ABNORMAL HIGH (ref 10–291)

## 2008-12-31 ENCOUNTER — Ambulatory Visit: Payer: Self-pay | Admitting: Hematology

## 2008-12-31 LAB — CBC WITH DIFFERENTIAL/PLATELET
Basophils Absolute: 0.1 10*3/uL (ref 0.0–0.1)
EOS%: 9 % — ABNORMAL HIGH (ref 0.0–7.0)
Eosinophils Absolute: 0.6 10*3/uL — ABNORMAL HIGH (ref 0.0–0.5)
HGB: 11.9 g/dL (ref 11.6–15.9)
LYMPH%: 30.1 % (ref 14.0–49.7)
MCH: 29 pg (ref 25.1–34.0)
MCV: 86.2 fL (ref 79.5–101.0)
MONO%: 10 % (ref 0.0–14.0)
NEUT#: 3.4 10*3/uL (ref 1.5–6.5)
NEUT%: 49.8 % (ref 38.4–76.8)
Platelets: 140 10*3/uL — ABNORMAL LOW (ref 145–400)
RDW: 17.4 % — ABNORMAL HIGH (ref 11.2–14.5)

## 2009-01-21 LAB — CBC WITH DIFFERENTIAL/PLATELET
EOS%: 8.5 % — ABNORMAL HIGH (ref 0.0–7.0)
Eosinophils Absolute: 0.6 10*3/uL — ABNORMAL HIGH (ref 0.0–0.5)
LYMPH%: 32.4 % (ref 14.0–49.7)
MCH: 29.9 pg (ref 25.1–34.0)
MCHC: 34.4 g/dL (ref 31.5–36.0)
MCV: 87 fL (ref 79.5–101.0)
MONO%: 11.8 % (ref 0.0–14.0)
NEUT#: 3.1 10*3/uL (ref 1.5–6.5)
Platelets: 134 10*3/uL — ABNORMAL LOW (ref 145–400)
RBC: 4 10*6/uL (ref 3.70–5.45)
RDW: 16.7 % — ABNORMAL HIGH (ref 11.2–14.5)

## 2009-02-07 ENCOUNTER — Ambulatory Visit: Payer: Self-pay | Admitting: Hematology

## 2009-02-11 LAB — CBC WITH DIFFERENTIAL/PLATELET
Basophils Absolute: 0 10*3/uL (ref 0.0–0.1)
EOS%: 6.4 % (ref 0.0–7.0)
Eosinophils Absolute: 0.4 10*3/uL (ref 0.0–0.5)
HCT: 35.1 % (ref 34.8–46.6)
HGB: 12.1 g/dL (ref 11.6–15.9)
MCH: 30.3 pg (ref 25.1–34.0)
MCV: 88.2 fL (ref 79.5–101.0)
NEUT#: 3.3 10*3/uL (ref 1.5–6.5)
NEUT%: 51.6 % (ref 38.4–76.8)
lymph#: 1.9 10*3/uL (ref 0.9–3.3)

## 2009-03-04 ENCOUNTER — Ambulatory Visit: Payer: Self-pay | Admitting: Hematology & Oncology

## 2009-03-05 LAB — CHCC SATELLITE - SMEAR

## 2009-03-05 LAB — CBC WITH DIFFERENTIAL (CANCER CENTER ONLY)
BASO%: 0.5 % (ref 0.0–2.0)
LYMPH%: 33 % (ref 14.0–48.0)
MCH: 30.6 pg (ref 26.0–34.0)
MCV: 91 fL (ref 81–101)
MONO%: 9.1 % (ref 0.0–13.0)
Platelets: 143 10*3/uL — ABNORMAL LOW (ref 145–400)
RDW: 12.4 % (ref 10.5–14.6)
WBC: 6.8 10*3/uL (ref 3.9–10.0)

## 2009-03-05 LAB — FERRITIN: Ferritin: 692 ng/mL — ABNORMAL HIGH (ref 10–291)

## 2009-06-09 ENCOUNTER — Ambulatory Visit: Payer: Self-pay | Admitting: Hematology & Oncology

## 2009-06-10 LAB — CBC WITH DIFFERENTIAL (CANCER CENTER ONLY)
BASO#: 0 10*3/uL (ref 0.0–0.2)
EOS%: 10.4 % — ABNORMAL HIGH (ref 0.0–7.0)
Eosinophils Absolute: 0.6 10*3/uL — ABNORMAL HIGH (ref 0.0–0.5)
LYMPH%: 30.3 % (ref 14.0–48.0)
MCH: 31.1 pg (ref 26.0–34.0)
MCHC: 34.3 g/dL (ref 32.0–36.0)
MCV: 91 fL (ref 81–101)
MONO%: 7.9 % (ref 0.0–13.0)
Platelets: 127 10*3/uL — ABNORMAL LOW (ref 145–400)
RBC: 3.83 10*6/uL (ref 3.70–5.32)

## 2009-06-10 LAB — CHCC SATELLITE - SMEAR

## 2009-09-02 ENCOUNTER — Ambulatory Visit: Payer: Self-pay | Admitting: Hematology & Oncology

## 2009-10-03 ENCOUNTER — Ambulatory Visit: Payer: Self-pay | Admitting: Hematology & Oncology

## 2009-10-14 LAB — CBC WITH DIFFERENTIAL (CANCER CENTER ONLY)
BASO#: 0 10*3/uL (ref 0.0–0.2)
BASO%: 0.4 % (ref 0.0–2.0)
EOS%: 3.8 % (ref 0.0–7.0)
HGB: 12.5 g/dL (ref 11.6–15.9)
LYMPH#: 1.5 10*3/uL (ref 0.9–3.3)
MCH: 30 pg (ref 26.0–34.0)
MCHC: 33.3 g/dL (ref 32.0–36.0)
MONO%: 6.3 % (ref 0.0–13.0)
NEUT#: 3.9 10*3/uL (ref 1.5–6.5)
Platelets: 213 10*3/uL (ref 145–400)
RDW: 11.3 % (ref 10.5–14.6)

## 2009-10-14 LAB — RETICULOCYTES (CHCC): RBC.: 4.16 MIL/uL (ref 3.87–5.11)

## 2010-02-17 ENCOUNTER — Ambulatory Visit: Payer: Self-pay | Admitting: Hematology & Oncology

## 2010-02-19 LAB — CBC WITH DIFFERENTIAL (CANCER CENTER ONLY)
BASO%: 0.3 % (ref 0.0–2.0)
EOS%: 3.4 % (ref 0.0–7.0)
MCH: 29.8 pg (ref 26.0–34.0)
MCHC: 33.1 g/dL (ref 32.0–36.0)
MONO%: 8.7 % (ref 0.0–13.0)
NEUT#: 3.6 10*3/uL (ref 1.5–6.5)
Platelets: 176 10*3/uL (ref 145–400)
RDW: 12.8 % (ref 10.5–14.6)

## 2010-04-01 ENCOUNTER — Ambulatory Visit: Payer: Self-pay | Admitting: Hematology & Oncology

## 2010-04-30 LAB — CBC WITH DIFFERENTIAL (CANCER CENTER ONLY)
BASO#: 0 10*3/uL (ref 0.0–0.2)
EOS%: 1.9 % (ref 0.0–7.0)
Eosinophils Absolute: 0.2 10*3/uL (ref 0.0–0.5)
LYMPH%: 20.5 % (ref 14.0–48.0)
MCH: 29.9 pg (ref 26.0–34.0)
MCHC: 33.6 g/dL (ref 32.0–36.0)
MCV: 89 fL (ref 81–101)
MONO%: 8 % (ref 0.0–13.0)
Platelets: 211 10*3/uL (ref 145–400)
RBC: 4.49 10*6/uL (ref 3.70–5.32)

## 2010-05-01 LAB — FERRITIN: Ferritin: 727 ng/mL — ABNORMAL HIGH (ref 10–291)

## 2010-05-01 LAB — IRON AND TIBC
%SAT: 24 % (ref 20–55)
Iron: 88 ug/dL (ref 42–145)
TIBC: 365 ug/dL (ref 250–470)

## 2010-07-30 ENCOUNTER — Ambulatory Visit: Payer: Self-pay | Admitting: Hematology & Oncology

## 2010-10-08 IMAGING — CR DG CHEST 2V
2 series · 2 of 2 positions shown · non-contrast
Comparison: [HOSPITAL] chest x-ray 12/22/2006.

CLINICAL DATA: Cough, shortness of breath, nonsmoker.

CHEST - 2 VIEW

[w chest pa]
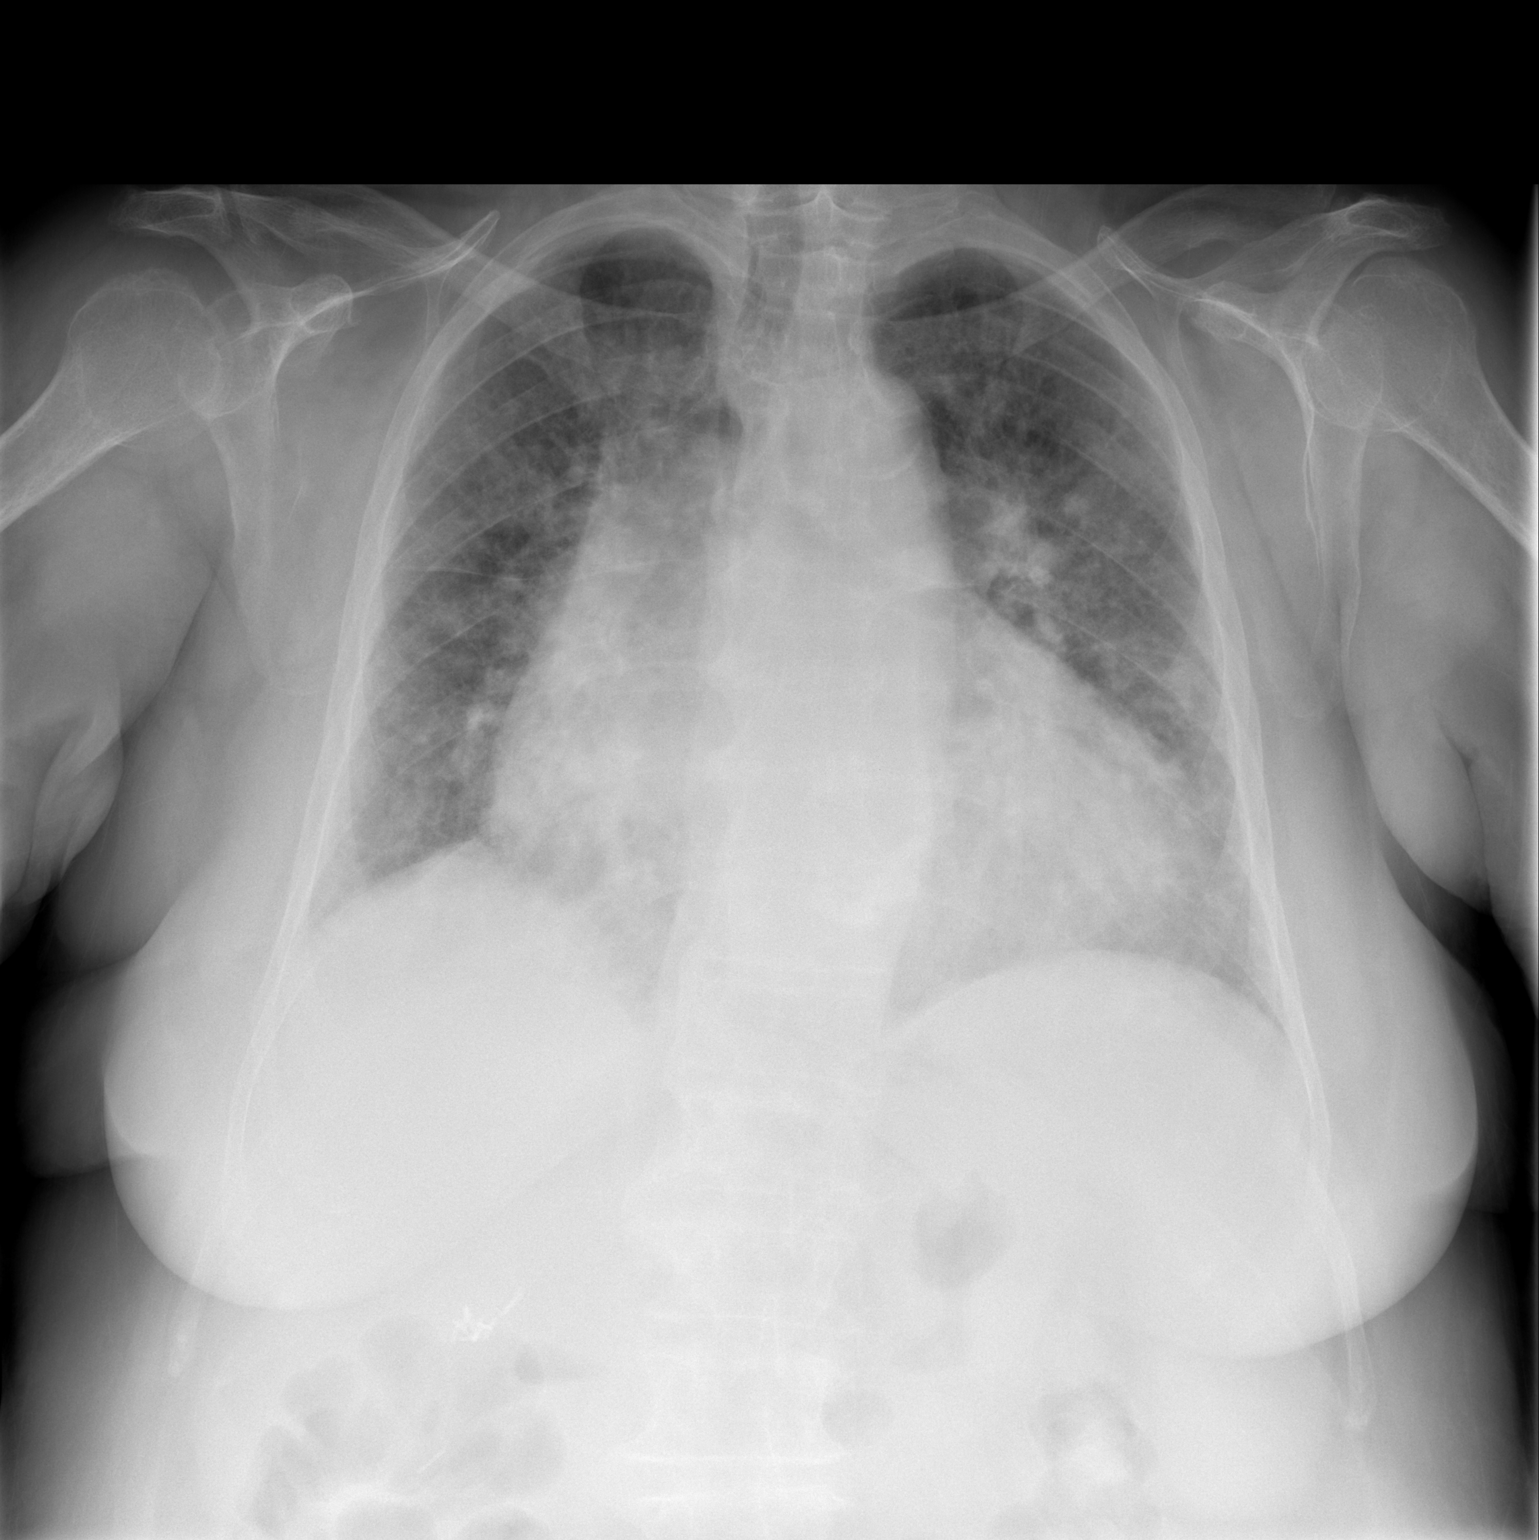

[w chest lat]
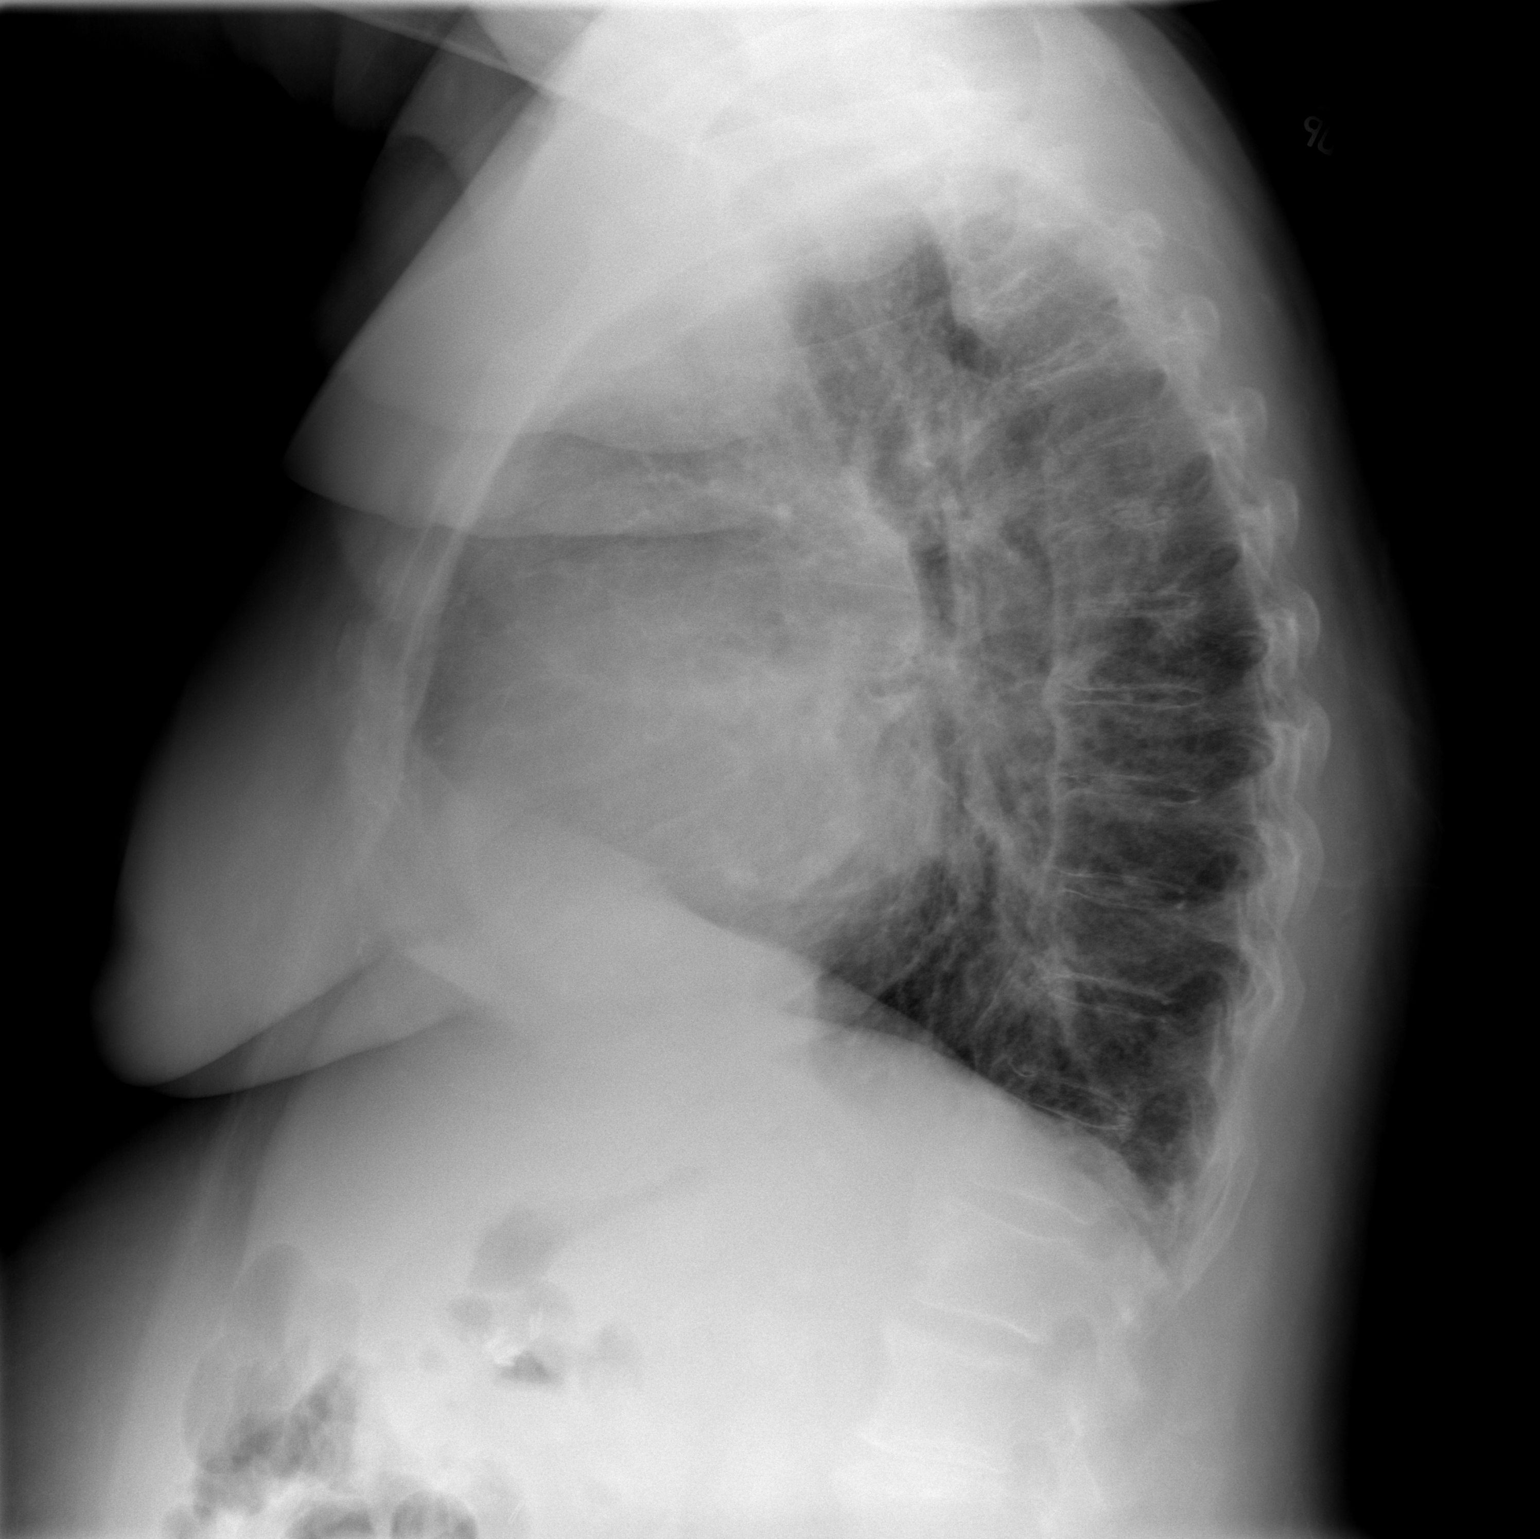

[2 of 2 positions shown; findings below may reference images not displayed]

FINDINGS: Stable cardiomegaly is seen with lesser inspiration.
Progressive diffuse airspace opacities consistent with slight
pulmonary edema noted.  No pleural effusions identified.  Stable
dorsal osteopenia and degenerative changes seen.
IMPRESSION: 1.  Stable cardiomegaly with current slight diffuse pulmonary
edema.
2.  No other new significant abnormality.

## 2010-10-13 ENCOUNTER — Ambulatory Visit: Payer: Self-pay | Admitting: Hematology & Oncology

## 2010-10-14 LAB — CBC WITH DIFFERENTIAL (CANCER CENTER ONLY)
BASO#: 0 10*3/uL (ref 0.0–0.2)
BASO%: 0.4 % (ref 0.0–2.0)
EOS%: 5.2 % (ref 0.0–7.0)
Eosinophils Absolute: 0.3 10*3/uL (ref 0.0–0.5)
HCT: 36.8 % (ref 34.8–46.6)
HGB: 12.5 g/dL (ref 11.6–15.9)
LYMPH#: 1.6 10*3/uL (ref 0.9–3.3)
LYMPH%: 25.6 % (ref 14.0–48.0)
MCH: 31.3 pg (ref 26.0–34.0)
MCHC: 34.1 g/dL (ref 32.0–36.0)
MCV: 92 fL (ref 81–101)
MONO#: 0.6 10*3/uL (ref 0.1–0.9)
MONO%: 10 % (ref 0.0–13.0)
NEUT#: 3.7 10*3/uL (ref 1.5–6.5)
NEUT%: 58.8 % (ref 39.6–80.0)
Platelets: 180 10*3/uL (ref 145–400)
RBC: 4.01 10*6/uL (ref 3.70–5.32)
RDW: 12.5 % (ref 10.5–14.6)
WBC: 6.3 10*3/uL (ref 3.9–10.0)

## 2010-11-11 IMAGING — CT CT HEAD W/O CM
1 series · 16 of 30 positions shown, 20 images · non-contrast
Comparison: MRI 08/29/2008

CLINICAL DATA: Involve self observed as well shortness of breath
atrial fibrillation.  Confusion and weakness.

CT HEAD WITHOUT CONTRAST
TECHNIQUE: Contiguous axial images were obtained from the base of
the skull through the vertex without contrast.

[Series 2: head routine 4.8 h37s · axial · 0.46mm/px · z∈[-143,-8]mm · 16 of 30 slices shown, 20 images]
[im 2/30  brain]
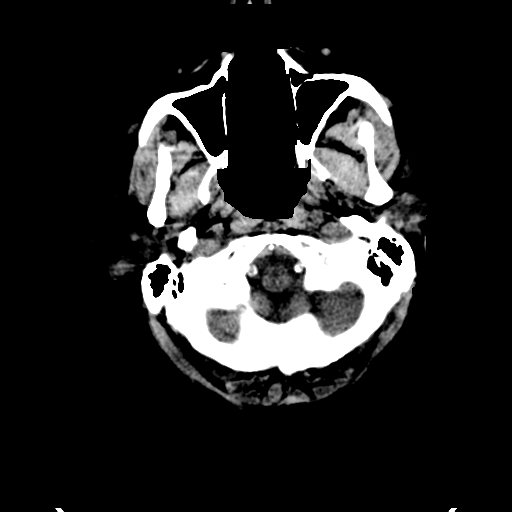
[im 2/30  bone]
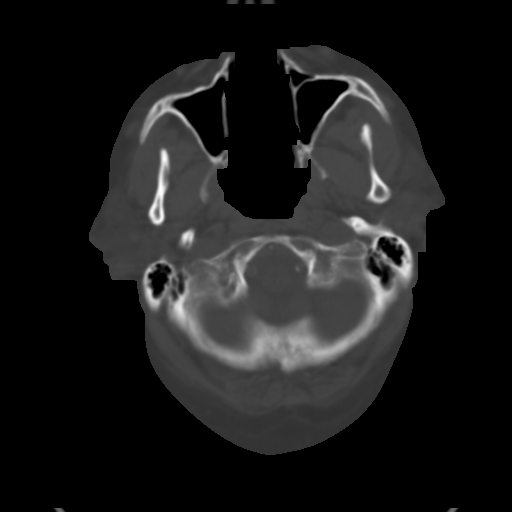
[im 4/30  brain]
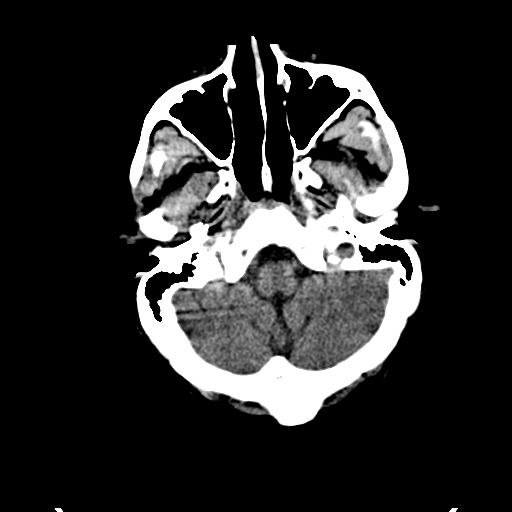
[im 6/30  brain]
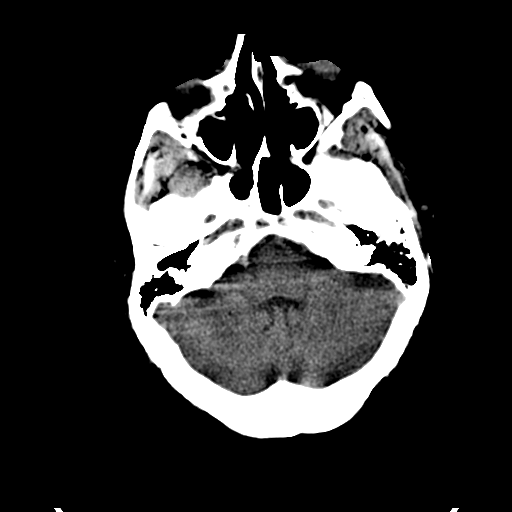
[im 8/30  brain]
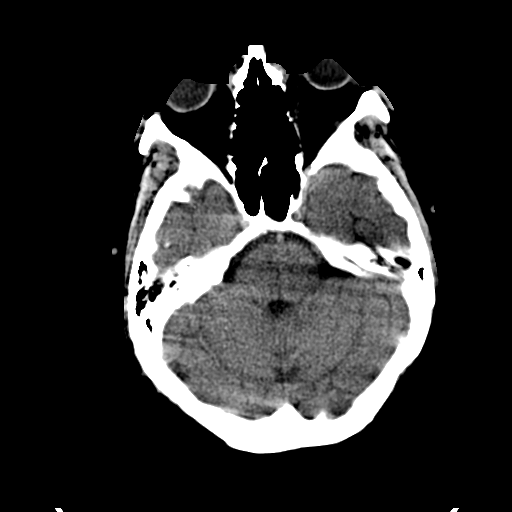
[im 9/30  brain]
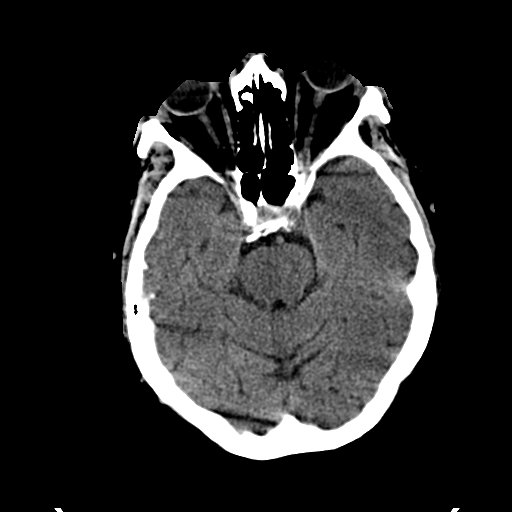
[im 9/30  bone]
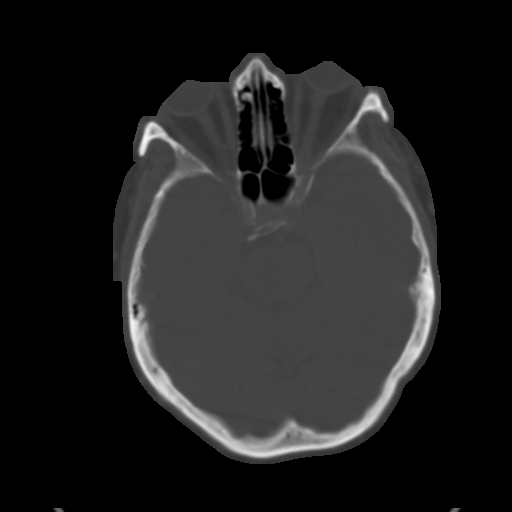
[im 11/30  brain]
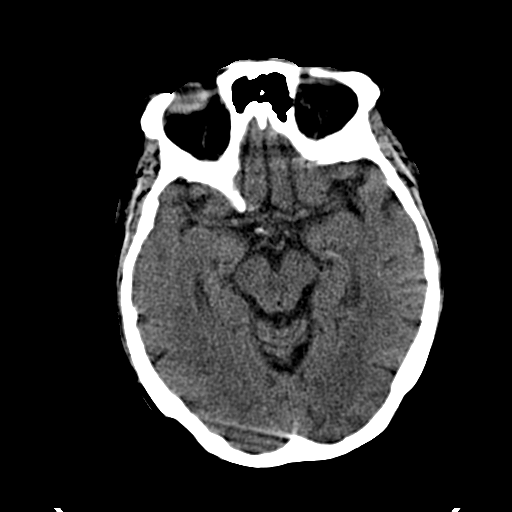
[im 13/30  brain]
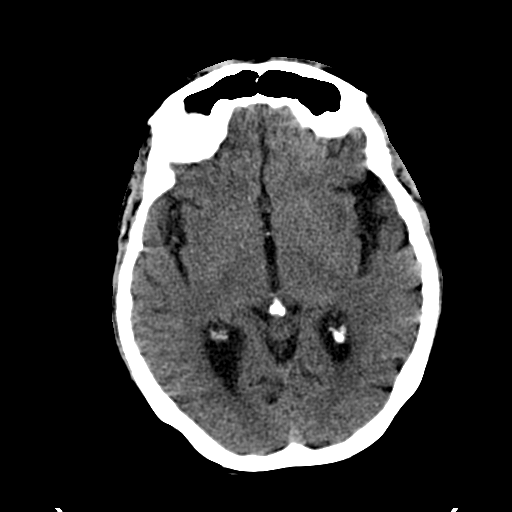
[im 15/30  brain]
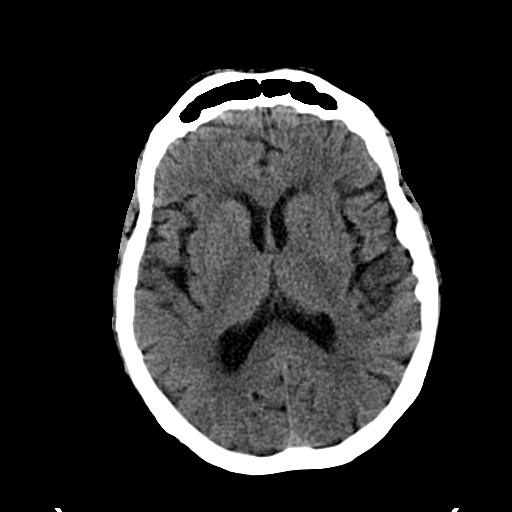
[im 16/30  brain]
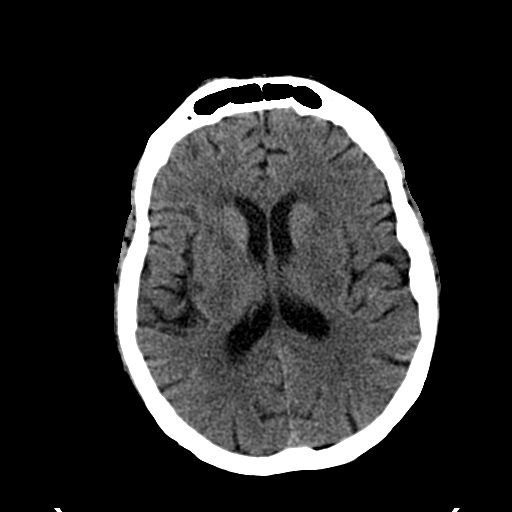
[im 16/30  bone]
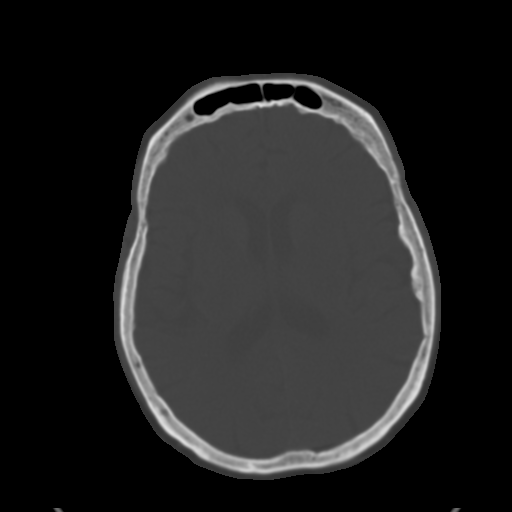
[im 18/30  brain]
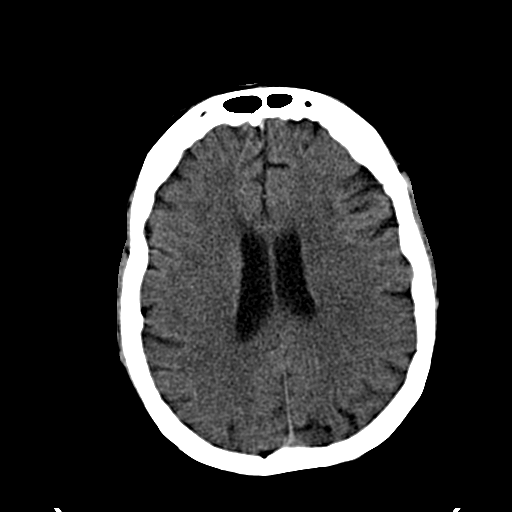
[im 20/30  brain]
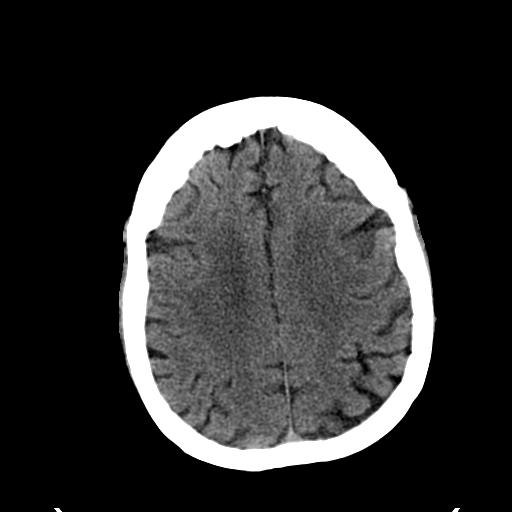
[im 22/30  brain]
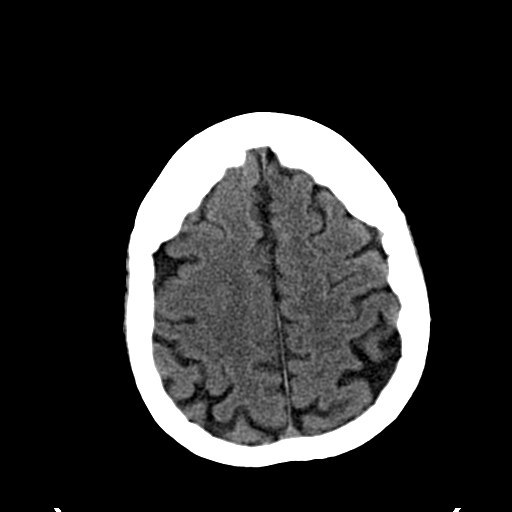
[im 23/30  brain]
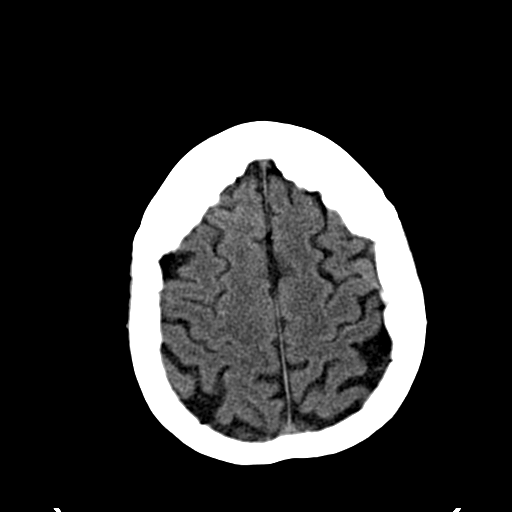
[im 23/30  bone]
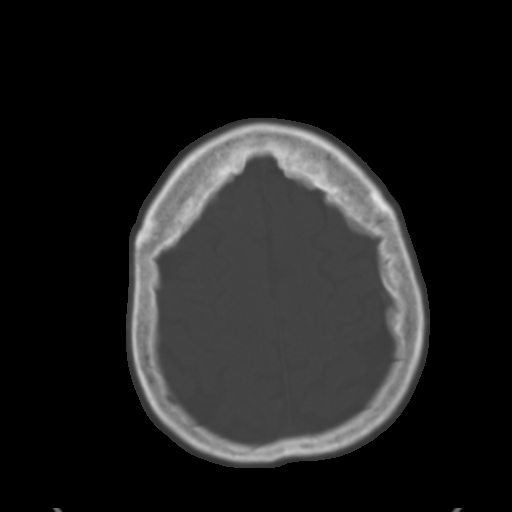
[im 25/30  brain]
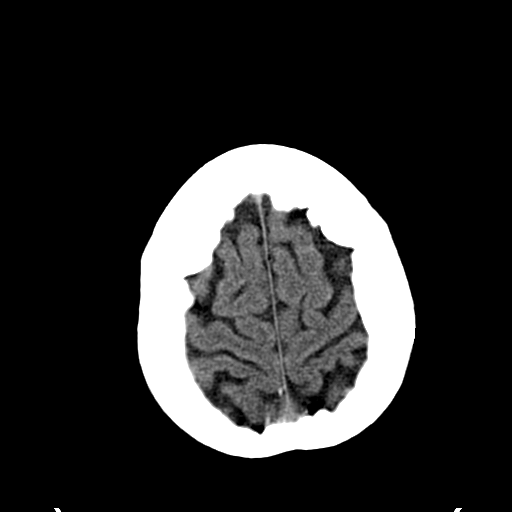
[im 27/30  brain]
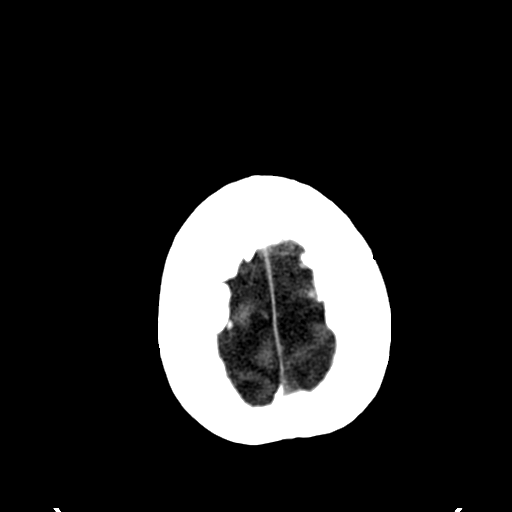
[im 29/30  brain]
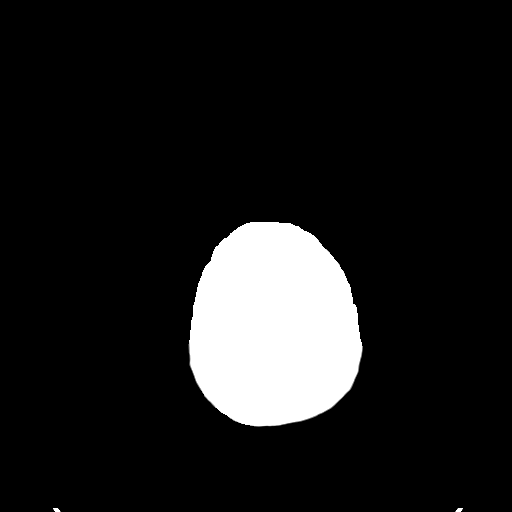

[16 of 30 positions shown; findings below may reference images not displayed]

FINDINGS: No acute intracranial abnormalities present.
Specifically, there is no evidence for acute infarct, hemorrhage,
mass, hydrocephalus, or extra-axial fluid collection.  Patchy white
matter disease is stable compared the prior MRI scan.  The
paranasal sinuses and mastoid air cells are clear.  The osseous
skull is intact.
IMPRESSION: 1.  No acute intracranial abnormality.
2.  Stable atrophy and white matter disease.

## 2010-12-23 ENCOUNTER — Encounter: Payer: Self-pay | Admitting: Internal Medicine

## 2011-01-13 ENCOUNTER — Encounter: Payer: Self-pay | Admitting: Internal Medicine

## 2011-01-21 ENCOUNTER — Encounter: Payer: Self-pay | Admitting: Internal Medicine

## 2011-01-22 ENCOUNTER — Encounter: Payer: Self-pay | Admitting: Internal Medicine

## 2011-01-25 ENCOUNTER — Ambulatory Visit (INDEPENDENT_AMBULATORY_CARE_PROVIDER_SITE_OTHER): Payer: Medicare Other | Admitting: Internal Medicine

## 2011-01-25 ENCOUNTER — Encounter: Payer: Self-pay | Admitting: Internal Medicine

## 2011-01-25 DIAGNOSIS — I495 Sick sinus syndrome: Secondary | ICD-10-CM

## 2011-01-25 DIAGNOSIS — G4733 Obstructive sleep apnea (adult) (pediatric): Secondary | ICD-10-CM

## 2011-01-25 DIAGNOSIS — I10 Essential (primary) hypertension: Secondary | ICD-10-CM

## 2011-01-25 DIAGNOSIS — N189 Chronic kidney disease, unspecified: Secondary | ICD-10-CM

## 2011-01-25 DIAGNOSIS — N039 Chronic nephritic syndrome with unspecified morphologic changes: Secondary | ICD-10-CM

## 2011-01-25 DIAGNOSIS — D631 Anemia in chronic kidney disease: Secondary | ICD-10-CM

## 2011-01-25 DIAGNOSIS — R001 Bradycardia, unspecified: Secondary | ICD-10-CM

## 2011-01-25 NOTE — Assessment & Plan Note (Signed)
The patient has documented sinus bradycardia, though I am not clear as to what degree she is symptomatic.  She also has a LBBB but no clear evidence of advanced AV block or symptoms of presyncope/ syncope etc.  Her EF is preserved.  Much of her bradycardia is at night and likely explained by OSA for which she is noncompliant with CPAP. She has symptoms of fatigue and decreased exercise tolerance which are multifactoral.  She has multiple comorbidities including renal failure, age, sleep apnea, obesity, and diastolic dysfunction which are also at play here. Today I had a long discussion with the patient and her daughter regarding options.  We discussed risks, benefits, and alternatives to pacemaker implantation.  She is very clear in her decision to avoid PPM at this time.  If she develops symptoms of dizziness, presyncope, or syncope, then she will reconsider. I think that it would be reasonable to decrease amiodarone to 100mg  daily to try to improve her bradycardia but will defer this decision to Dr Anne Fu.  She will continue to follow closely with Dr Anne Fu and I will see her as needed should worsening bradycardia arise.

## 2011-01-25 NOTE — Assessment & Plan Note (Signed)
Likely contributing significantly to symptoms. Weight loss advised.

## 2011-01-25 NOTE — Patient Instructions (Signed)
Your physician recommends that you schedule a follow-up appointment as needed  

## 2011-01-25 NOTE — Assessment & Plan Note (Signed)
Not compliant with CPAP Much of her nocturnal bradycardia may be exacerbated by sleep apnea.  I have recommended complaince with CPAP but she is clear in her intent to decline.

## 2011-01-25 NOTE — Assessment & Plan Note (Signed)
stable °

## 2011-01-25 NOTE — Progress Notes (Signed)
Ms Bensen is a pleasant 77 yo WF with a h/o prior AVR 09/04/08 by Dr Tyrone Sage with paroxysmal atrial fibrillation and diastolic dysfunction who presents today for EP consultation regarding bradycardia.  She has recently been observed to have bradycardia.  She states that she is able to do her own house work but finds that she gets "tired" if she pushes herself too hard.  She reports fatigue and knee pain which limit her ability to walk more than 50-100 feet or do grocery shopping. She denies dizziness, lightheadedness, presyncope, or syncope. Today, she denies symptoms of palpitations, chest pain, shortness of breath, orthopnea, PND, lower extremity edema,or neurologic sequela. The patient is tolerating medications without difficulties and is otherwise without complaint today.   Past Medical History  Diagnosis Date  . Coronary artery disease 09/04/08    single vessel CABG at time of AVR   . Aortic stenosis 09/04/08    s/p AVR by Dr Tyrone Sage (tissue valve)  . Paroxysmal atrial fibrillation   . LBBB (left bundle branch block)   . CHF (congestive heart failure)     diastolic  . Sleep apnea   . Alzheimer's dementia   . Diabetes mellitus     type 2  . Hypertension   . Chronic anemia   . Osteoporosis   . Chronic renal failure    Past Surgical History  Procedure Date  . Coronary artery bypass graft 09/04/08    single vessel at time of AVR  . Aortic valve replacement 12/09    tissue valve by Dr Tyrone Sage  . Cholecystectomy 1995  . Cataract extraction   . Replacement total knee bilateral     L 2000, R 1997    Current Outpatient Prescriptions  Medication Sig Dispense Refill  . acetaminophen (TYLENOL) 325 MG tablet Take 650 mg by mouth every 6 (six) hours as needed.        Marland Kitchen aspirin 325 MG tablet Take 325 mg by mouth daily.        . clonazePAM (KLONOPIN) 0.5 MG tablet Take 0.5 mg by mouth 2 (two) times daily as needed.        . docusate sodium (COLACE) 100 MG capsule Take 100 mg by mouth  daily.        Marland Kitchen donepezil (ARICEPT) 10 MG tablet Take 10 mg by mouth at bedtime as needed.        . ferrous sulfate (GNP IRON) 325 (65 FE) MG tablet Take 325 mg by mouth daily with breakfast.        . fish oil-omega-3 fatty acids 1000 MG capsule Take 2 g by mouth daily.        . furosemide (LASIX) 40 MG tablet Take 80 mg by mouth 2 (two) times daily.       Marland Kitchen gabapentin (NEURONTIN) 600 MG tablet Take 600 mg by mouth. Take 1/2 tablet AM and 1 tablet at bedtime       . glipiZIDE (GLUCOTROL) 10 MG 24 hr tablet Take 10 mg by mouth daily.        Marland Kitchen HYDROcodone-acetaminophen (NORCO) 10-325 MG per tablet Take 1 tablet by mouth as needed.        . methocarbamol (ROBAXIN) 500 MG tablet Take 500 mg by mouth 2 (two) times daily.        . Multiple Vitamin (MULTIVITAMIN) tablet Take 1 tablet by mouth daily.        Marland Kitchen PARoxetine (PAXIL) 40 MG tablet Take 40 mg by mouth every morning.        Marland Kitchen  potassium chloride SA (K-DUR,KLOR-CON) 20 MEQ tablet Take 20 mEq by mouth 2 (two) times daily.       . simvastatin (ZOCOR) 10 MG tablet Take 10 mg by mouth at bedtime.        Marland Kitchen warfarin (COUMADIN) 5 MG tablet Take 5 mg by mouth daily.        Marland Kitchen zolpidem (AMBIEN) 5 MG tablet Take 5 mg by mouth at bedtime as needed.        Marland Kitchen DISCONTD: aspirin (ANACIN) 81 MG EC tablet Take 81 mg by mouth daily.        Marland Kitchen DISCONTD: metFORMIN (GLUCOPHAGE) 500 MG tablet Take 500 mg by mouth daily.        Marland Kitchen DISCONTD: metoprolol tartrate (LOPRESSOR) 25 MG tablet Take by mouth. Take 1/2 tablet twice a day       . DISCONTD: rosiglitazone (AVANDIA) 2 MG tablet Take 2 mg by mouth 2 (two) times daily.        Marland Kitchen DISCONTD: simvastatin (ZOCOR) 40 MG tablet Take 40 mg by mouth at bedtime.        Marland Kitchen DISCONTD: traMADol (ULTRAM) 50 MG tablet Take 50 mg by mouth every 6 (six) hours as needed.        Marland Kitchen DISCONTD: warfarin (COUMADIN) 2.5 MG tablet Take 2.5 mg by mouth. Take as directed per Coumadin clinic         Allergies  Allergen Reactions  . Celecoxib      History   Social History  . Marital Status: Widowed    Spouse Name: N/A    Number of Children: N/A  . Years of Education: N/A   Occupational History  . retired     lives with her daughter   Social History Main Topics  . Smoking status: Never Smoker   . Smokeless tobacco: Not on file  . Alcohol Use: No  . Drug Use: No  . Sexually Active: Not on file   Other Topics Concern  . Not on file   Social History Narrative   Lives in [place] with daughter.  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    Family History  Problem Relation Age of Onset  . Other Father 54    deceased. multiple MI   . Lung cancer Mother 37    died    ROS- All systems are reviewed and negative except as per the HPI above  Physical Exam: Filed Vitals:   01/25/11 1509  BP: 138/72  Pulse: 69  Height: 5\' 4"  (1.626 m)  Weight: 255 lb (115.667 kg)    GEN- The patient is chronically ill appearing and overweight, alert and oriented x 3 today.   Head- normocephalic, atraumatic Eyes-  Sclera clear, conjunctiva pink Ears- hearing intact Oropharynx- clear Neck- supple, no JVP Lymph- no cervical lymphadenopathy Lungs- Clear to ausculation bilaterally, normal work of breathing Heart- Regular rate and rhythm, no murmurs, rubs or gallops, PMI not laterally displaced GI- soft, NT, ND, + BS Extremities- no clubbing, cyanosis, trace edema MS- diffuse muscle atrophy Skin- no rash or lesion Psych- euthymic mood, full affect Neuro- strength and sensation are intact  EKG-  Sinus rhythm with PACs, LBBB, V rate 69 bpm  24 hour holter 01/13/11- sinus rhythm with PACs, Average HR 56 bpm, HR range 35-82 bpm, significant but not all of bradycardia was nocturnal.  Hr as low as 35 bpm during wakeful periods.  No AV block.  Assessment and Plan:

## 2011-01-25 NOTE — Assessment & Plan Note (Signed)
Stable

## 2011-02-09 NOTE — Op Note (Signed)
[name], [name]                ACCOUNT NO.:  [id number]   MEDICAL RECORD NO.:  [id number]          PATIENT TYPE:  INP   LOCATION:  2017                         FACILITY:  [facility]   PHYSICIAN:  Sheliah Plane, MD    DATE OF BIRTH:  08/09/1933   DATE OF PROCEDURE:  09/04/2008  DATE OF DISCHARGE:                               OPERATIVE REPORT   PREOPERATIVE DIAGNOSES:  Critical aortic stenosis with congestive heart  failure and coronary occlusive disease.   POSTOPERATIVE DIAGNOSES:  Critical aortic stenosis with congestive heart  failure and coronary occlusive disease.   SURGICAL PROCEDURE:  Aortic valve replacement with [facility],  pericardial tissue valve model 3300 TFX, 23 mm, serial number [id number]  and coronary artery bypass grafting x1 with left internal mammary to the  circumflex coronary artery.   SURGEON:  Sheliah Plane, MD   FIRST ASSISTANT:  Dr. Dorris Fetch.   BRIEF HISTORY:  The patient is a 77 year old female with known  longstanding critical aortic stenosis who presents with acute  exacerbation of congestive heart failure with onset of pulmonary edema.  Echocardiogram showed progression of aortic stenosis from an estimated  valve area of 0.7 in the summer of 2009 to 0.4 now and decreased  ejection fraction from normal to 30%.  The patient's medical condition  was stabilized.  She presented with a significant bronchitis and sputum  production and distant in addition to pulmonary edema.  She underwent  cardiac catheterization which demonstrated an ostial circumflex lesion,  but otherwise clear coronaries.  She was known to be a high surgical  risk because of her significant obesity with a base body surface area of  2.2, 5 feet and 4 inches tall, and 271 pounds with significant  degenerative disease in her hips and knees.  However, with her severe  symptoms and rapidly progressing and increasing aortic stenosis,  coronary artery bypass grafting was  recommended to the patient after  discussing the risks and options with her and her family.   DESCRIPTION OF PROCEDURE:  With Swan-Ganz and arterial line monitors in  place, the patient underwent general endotracheal anesthesia.  Transesophageal echo probe was placed which confirmed the diagnosis of a  tricuspid aortic valve with severe aortic stenosis and decreased  ejection fraction 30%.  Because of the patient's obesity and especially  significant adipose tissue in both lower extremities, was elected to use  the left internal mammary artery to the circumflex coronary artery.  The  median stick after prepping and draping the patient in usual sterile  manner, median sternotomy was performed and left internal mammary artery  was dissected down as pedicle graft.  The distal artery was divided and  had good free flow.  Pericardium was opened, the patient had evidence of  biventricular hypertrophy.  She was systemically heparinized, ascending  aorta was cannulated, right atrium was cannulated, and aortic root bent  cardioplegia needle was introduced into the ascending aorta.  The  patient was placed on cardiopulmonary bypass 2.4 liters per minute per  meter squared.  A right superior pulmonary vein was placed, heart was  elevated,  and site anastomosis and the distal circumflex was identified.  The patient's body temperature was cooled to 30 degrees.  Aortic  crossclamp was applied, 500 mL of cold blood potassium cardioplegia was  administered antegrade into the aortic root.  Attention was turned first  to the coronary artery disease, the heart was elevated, the distal  circumflex vessel was opened and admitted 1-1/2-mm probe.  Using running  8-0 Prolene, the left internal mammary artery was anastomosed to the  circumflex coronary artery as a pedicle graft.  With release of the  bulldog on the mammary artery, the anastomosis was free of bleeding.  The bulldog was placed back on the mammary  artery and the fascia was  tacked to the epicardium.  Attention was then turned to the aortic  valve.  A transverse aortotomy was performed.  This gave a good view of  a tricuspid highly calcified aortic valve.  The valve was excised and  the annulus was debrided with loose calcific debris, a magnet ease  pericardial tissue valve, [employer], model 3300 TFX 23 mm,  serial number [id number] was selected and #2 Ticron-pledgeted sutures with  a pledgets on the ventricular surface were placed circumferentially  around the annulus and used to seat the valve which seated well with the  valve seated.  There was no obstruction of the left or right coronary  ostium.  The aortotomy was closed with a horizontal mattress 3-0 Prolene  suture over Felt strips.  Prior to complete closure, heart was allowed  to passively fill and deaired.  The aortotomy was completed and a second  layer of over-and-over 3-0 Prolene suture was performed.  Intermittently  during the procedure, cold blood cardioplegia administered directly down  the coronary ostium.  Aortic crossclamp was removed with the total  crossclamp time of 107 minutes.  The root vent was used to further deair  the heart.  The patient spontaneously converted to a sinus rhythm.  The  site of anastomosis was inspected and free of bleeding with the  patient's body temperature rewarmed to 37 degrees and on low-dose  milrinone she was then ventilated and weaned cardiopulmonary bypass  without difficulty.  She remained hemodynamically stable.  She  decannulated in usual fashion.  Atrial and ventricular pacing wires were  applied.  She was decannulated in usual fashion with the patient  successfully separated from cardiopulmonary bypass.  She was  decannulated in usual fashion.  Protamine sulfate was administered with  the operative field hemostatic.  The pericardium over the ascending  aorta was loosely reapproximated.  The left chest tube and  Blake  mediastinal drain were left in place.  Sternum was closed with #6  stainless steel wire.  Fascia closed with interrupted 0 Vicryl, running  3-0 Vicryl subcutaneous tissue, 4-0 subcuticular stitch in the skin  edges.  Dry dressings were applied.  Sponge and needle count was  reported as correct at completion of procedure.  The patient tolerated  the procedure without obvious complication and was transferred to  Surgical Intensive Care Unit for further postoperative care.  Total pump  time was 142 minutes.      Sheliah Plane, MD  Electronically Signed     EG/MEDQ  D:  09/08/2008  T:  09/09/2008  Job:  [id number]   cc:   Francisca December, M.D.  Michael L. Thad Ranger, M.D.

## 2011-02-09 NOTE — Cardiovascular Report (Signed)
[name], [name]                ACCOUNT NO.:  [id number]   MEDICAL RECORD NO.:  [id number]          PATIENT TYPE:  INP   LOCATION:  2036                         FACILITY:  [facility]   PHYSICIAN:  Francisca December, M.D.  DATE OF BIRTH:  11-06-1932   DATE OF PROCEDURE:  08/30/2008  DATE OF DISCHARGE:                            CARDIAC CATHETERIZATION   PROCEDURES PERFORMED:  Coronary angiography.   INDICATIONS:  This is a preop coronary assessment anticipating aortic  valve replacement surgery.  The patient has critical aortic stenosis and  has developed heart failure with decreasing LV function.   PROCEDURE NOTE:  The patient was brought to the Cardiac Catheterization  Laboratory in a fasting state.  The left groin was prepped and draped in  the usual sterile fashion.  Local anesthesia was obtained with  infiltration of 1% lidocaine.  A 6-French catheter sheath was inserted  percutaneously into the left femoral artery utilizing an anterior  approach over guiding J-wire.  Coronary angiography then proceeded in  standard fashion using 6-French #4 left and right Judkins catheters.  Cineangiography of each coronary artery was conducted in multiple LAO  and RAO projections.  At the completion of the procedure, the catheter  and catheter sheaths were removed.  Hemostasis was achieved by direct  pressure.  The patient was transported to the recovery area in stable  condition with an intact distal pulse.   HEMODYNAMICS:  Systemic arterial pressure was 135/75 with a mean of 98  mmHg.   ANGIOGRAPHY:  There was a right-dominant coronary system present.  The  left main coronary artery was normal.   The left main coronary artery was widely patent.   The left anterior descending artery and its branches are widely patent  as well.  Two moderate-sized diagonal branches arise, which are without  obstruction.  The ongoing anterior descending artery reaches and  traverses the apex.  There is a 20%  midvessel focal stenosis.   The left circumflex coronary artery and its branches are moderately  diseased; the left circumflex demonstrates a 50-60% ostial narrowing.  The ongoing vessel gives rise to a single marginal branch, which is  large.   There is a ramus intermedius as well and this demonstrates luminal  irregularities in the proximal segment, but no significant obstruction.  This is a large vessel.   The right coronary artery is widely patent and there is a tubular 25%  stenosis in the midportion.  Otherwise, no significant obstructions are  seen in the proximal or distal portion.  The distal portion bifurcates  into a small posterior descending artery and a large posterolateral  segment with 1 small left ventricular branch and 1 large left  ventricular branch.  There is also a small third left ventricular  branch.  No significant obstructions are seen in these more distal  vessels.   Collateral vessels are not seen.   FINAL IMPRESSION:  Atherosclerotic cardiovascular disease, single  vessel, ostial left circumflex.   PLAN:  Per Cardiac Surgery, suspect will need single-vessel bypass at  the time of her aortic valve replacement.  Francisca December, M.D.  Electronically Signed     JHE/MEDQ  D:  08/30/2008  T:  08/31/2008  Job:  [id number]   cc:   Sheliah Plane, MD

## 2011-02-09 NOTE — H&P (Signed)
NAME:  [name], [name]                ACCOUNT NO.:  [id number]   MEDICAL RECORD NO.:  [id number]          PATIENT TYPE:  INP   LOCATION:                               FACILITY:  [facility]   PHYSICIAN:  Francisca December, M.D.  DATE OF BIRTH:  March 28, 1933   DATE OF ADMISSION:  08/26/2008  DATE OF DISCHARGE:                              HISTORY & PHYSICAL   CHIEF COMPLAINT:  Shortness of breath, mental status change.   [name] is a 77 year old female patient of Dr. Corliss Marcus who has  had over the last few weeks some increasing shortness of breath.  Daughter has noticed that she has been less active around the house.  She has also noticed some increased somnolence as well as increased  peripheral edema.  The patient denies having any chest pain or  palpitations.  She has a history of severe aortic stenosis AVA at 0.7  cm2.  She has low normal EF at time 50% to 55%.  The patient has morbid  obesity, diabetes and obstructive sleep apnea.  She has had a 35-pound  weight gain since she was last seen in the office back in August 2009.  Due to her decrease in functional status as well as her increased  shortness of breath and increased weight, decision was made to proceed  with admission.   REVIEW OF SYSTEMS:  GENERAL:  As above.  Increased fatigue.  No recent  fever or chills.  HEENT: No problems with vision, epistaxis, nasal  congestion, sore throat.  NECK:  No stiffness.  CHEST:  Denies chest  pain or pressure with or without rest, no palpitations or  tachyarrhythmias.  RESPIRATORY:  As above.  Denies PND or orthopnea.  ABDOMEN:  Obese.  Denies nausea or vomiting, abdominal pain, problems  with stools.  GU:  Recent urinary tract infection treated a few weeks  ago, symptoms resolved.  EXTREMITIES:  Increased peripheral edema as  above.  History of femoral fracture with surgical repair.   PAST MEDICAL HISTORY:  1. Severe aortic stenosis.  2. Obesity.  3. Chronic anemia.  4. History of  paroxysmal atrial fibrillation.  5. Hypertension.  6. Obstructive sleep apnea.  7. Type 2 diabetes mellitus.   PAST SURGICAL HISTORY:  1. Cholecystectomy 1999.  2. Knee replacement 2000.  3. Cataract excision in 2002.  4. Periprosthetic femoral fracture with surgical repair July 2007.   CURRENT MEDICATIONS:  1. Lovastatin 40 mg a day.  2. Paroxetine 40 mg at bedtime daily.  3. Wellbutrin XL 300 mg daily.  4. Neurontin 300 mg the morning, 600 mg in the evening.  5. Ferrous sulfate 325 mg b.i.d.  6. Aspirin 81 mg daily.  7. Lisinopril/hydrochlorothiazide 20/12.5 mg 1 a day.  8. Clonazepam 0.5 mg at bedtime daily.  9. Aricept 10 mg daily.  10.Avandia 2 mg daily.  11.Namenda 10 mg b.i.d.  12.Aranesp injections every month.  13.Norco 5/325 mg p.r.n.   DRUG ALLERGIES:  None known.   FAMILY HISTORY:  Father died at age 47 and had 4 MIs prior to his death.  He  also had diabetes.  Mother died at age 75 with a history of lung  cancer and hypertension.  The patient has 2 living sisters, 1 with  hypertension, the other with diabetes.  One brother died at age 83 with  multiple sclerosis.   SOCIAL HISTORY:  She lives with her daughters.  Married, worked  previously in Audiological [employer].  No tobacco or alcohol use.   OBJECTIVE DATA:  VITAL SIGNS:  Weight 299 pounds, pulse 104, blood  pressure 140/94.  GENERAL:  Very pleasant, in no acute distress.  Answers questions  appropriately.  HEENT:  Unremarkable.  NECK:  No JVD.  Carotids are barely palpable.  No adenopathy.  CARDIOVASCULAR:  Reveals a normal S1 and S2.  There is a loud systolic  ejection murmur, 3/6.  LUNGS:  Decreased bilaterally, but no rales, wheezing or rhonchi  auscultated.  ABDOMEN:  Obese.  Nontender.  Positive bowel sounds.  No abdominal  bruits heard.  EXTREMITIES:  There is 3+ nonpitting edema in the feet, ankles and up to  the knees.  No clubbing or cyanosis.  NEURO:  Nonfocal.   Chest x-ray reveals cardiomegaly and  pulmonary edema.   IMPRESSION:  1. Congestive heart failure.  2. Severe aortic stenosis.  3. History of paroxysmal atrial fibrillation.  4. Hypertension.  5. Obstructive sleep apnea.   PLAN:  Admit to monitored bed.  IV Lasix 80 mg b.i.d.  Check labs BMET  in the morning.  Dr. Amil Amen to follow.      Tamera C. Lianne Cure      Francisca December, M.D.  Electronically Signed    TCL/MEDQ  D:  08/26/2008  T:  08/26/2008  Job:  [id number]

## 2011-02-09 NOTE — Discharge Summary (Signed)
NAMEJESENIA, [name]                ACCOUNT NO.:  [id number]   MEDICAL RECORD NO.:  [id number]          PATIENT TYPE:  INP   LOCATION:  2013                         FACILITY:  [facility]   PHYSICIAN:  Doree Fudge, PA DATE OF BIRTH:  12/14/1932   DATE OF ADMISSION:  08/26/2008  DATE OF DISCHARGE:  09/12/2008                               DISCHARGE SUMMARY   ADDENDUM   Admitting and discharge diagnoses are the same.  The only change  additional discharge is elevated creatinine (1.62).   BRIEF HOSPITAL COURSE STAY:  Since last dictation, the patient remained  afebrile and hemodynamically stable.  She did have decreased O2  saturations (85%) while ambulating.  Therefore, the patient was going to  be discharged home on oxygen.  In addition, she had increased heart rate  into the 150s very briefly the morning of December 17 (appeared to be  some ventricular bigeminy as well as some V tach currently).  On  postoperative day #8, she remains afebrile.  Vital signs are stable.  Preoperative weight is 123 kg, today's weight is down to 120.4 kg.  CBGs  were 116, 116, and 133 respectively.   PHYSICAL EXAMINATION:  CARDIOVASCULAR:  Regular rate and rhythm.  PULMONARY:  Clear to auscultation bilaterally.  ABDOMEN:  Benign.  Incisions are clean, dry, and continuing to heal.  EXTREMITIES:  Positive lower extremity edema.   The patient is going to be discharged home with home health and as  previously stated with oxygen.  Home health will draw her PT/INR on  September 13, 2008.  Results to be faxed to Dr. Amil [name]' office.  He will  then continue to follow her INR and adjust her Coumadin accordingly.   Remaining followup appointment, no change since prior dictation.   Changes to medications include the following:  The patient will not be  discharged home on Norco.  She is being discharged on:  1. Ultram 50 mg 1-2 tablets q.12 h p.r.n. pain.  2. Lasix 20 mg p.o. daily.  3. KCl 10 mEq p.o.  daily.  4. Coumadin dose as previous stated is 2.5 mg p.o. daily or as      directed.  5. Avandia 2 mg p.o. daily (1 in the morning and 1 at night).      According to the patient, preoperatively she took 2 mg p.o. in the      morning and 2 mg p.o. at night.  The patient was instructed as her      glucose increases, she may resume her home dose.  In addition, as      previously stated in the followup appointment, she is to contact      Dr.      Bryan [name] office within 1 week for further management of her      diabetes.  In addition, latest PT and INR today are 20.9 and 1.7.      Last BMET done today as well showed the potassium to be 4.6.  BUN      and creatinine were 35 and 1.62 respectively.      [name]  [name], [place]     DZ/MEDQ  D:  09/12/2008  T:  09/13/2008  Job:  [id number]   cc:   [name]

## 2011-02-09 NOTE — Discharge Summary (Signed)
[name], FEILD                ACCOUNT NO.:  [id number]   MEDICAL RECORD NO.:  [id number]          PATIENT TYPE:  INP   LOCATION:  2013                         FACILITY:  [facility]   PHYSICIAN:  Sheliah Plane, MD    DATE OF BIRTH:  [birth year]   DATE OF ADMISSION:  08/26/2008  DATE OF DISCHARGE:  09/11/2008                               DISCHARGE SUMMARY   ADMITTING DIAGNOSES:  1. Severe aortic stenosis.  2. History of hypertension.  3. History of diabetes mellitus.  4. History of obstructive sleep apnea.  5. History of chronic anemia.  6. History of obesity.  7. History of paroxysmal atrial fibrillation.  8. History of early Alzheimer's.  9. History of gastrointestinal bleeding (secondary to hemorrhoids      according to the patient).  10.Congestive heart failure (secondary to severe aortic stenosis).   DISCHARGE DIAGNOSES:  1. Severe aortic stenosis.  2. History of hypertension.  3. History of diabetes mellitus.  4. History of obstructive sleep apnea.  5. History of chronic anemia.  6. History of obesity.  7. History of paroxysmal atrial fibrillation.  8. History of early Alzheimer's.  9. History of gastrointestinal bleeding (secondary to hemorrhoids      according to the patient).  10.Congestive heart failure (secondary to severe aortic stenosis).  11.Postoperative acute blood loss anemia (the patient with a history      of chronic anemia, last H and H on September 10, 2008 was 9.2 and      27.4 respectively.  12.Thrombocytopenia (platelet count on September 10, 2008, 148,000).   PROCEDURES:  1. Echocardiogram done on August 27, 2008, which revealed the left      ventricular ejection fraction estimated to be 25-30%.  Findings      consistent with severe aortic stenosis (estimated aortic valve area      by VTI 0.43 cm2 and by Vmax 0.51 cm2), left atrium mildly dilated      as well as the right ventricle.  2. Cardiac catheterization performed on August 30, 2008 by  Dr.      Amil Amen revealed single-vessel coronary artery disease (50-60%      ostial narrowing of the left circumflex).  3. Aortic valve replacement (with an [employer] pericardial      tissue valve 23 mm), coronary artery bypass graft x1 (left internal      mammary artery to circumflex by Dr. Tyrone Sage on September 04, 2008).   HISTORY OF PRESENT ILLNESS:  This is a 77 year old Caucasian female with  known severe aortic stenosis as well as a multitude of medical  problems.  Please see the aforementioned admitting diagnoses.  Over the  last several weeks, she had complaints of increasing shortness of breath  with minimal exertion, dizziness, and unsteadiness on her feet.  The  patient lives with her daughter who also apparently noted she has become  less active around the house as well as increased somnolence and  increased peripheral edema.   In addition, on August 13, 2008, the daughter stated the patient had  developed unsteady gait, increasing confusion, complaint of eye  problems and had an episode of slurred speech.  She was evaluated by  Dr. Thad Ranger who was going to order laboratory tests as well as a chest  x-ray secondary to cough.  He did not rule out that she possibly had a  TIA, but the followup tests were negative.  He would have a low  threshold for looking at imaging studies.  In the interim, however, the  patient developed severe shortness of breath, peripheral edema, and  changes in mental status.  The patient was admitted for further  evaluation and treatment.   The patient's BMP at the time of admission was elevated at 924.  Because  of her mental status changes, a neurological consult was obtained.  CT  of the head done on August 28, 2008 showed no acute intracranial  abnormalities.  MRA of the head done on August 29, 2008 showed negative  intracranial MR angiography of large to medium-sized vessels.  A  Cardiothoracic consultation was obtained with Dr.  Tyrone Sage.  The patient  then underwent a cardiac catheterization on August 30, 2008 and was  found to have single-vessel coronary artery disease.  The patient was  treated for acute bronchitis with Avelox.  Sputum cultures were  negative.  She was also treated for cutaneous yeast infection.  She then  underwent the aforementioned AVR, CABG x1 with Dr. Tyrone Sage on September 04, 2008.   HOSPITAL COURSE STAY:  The patient was extubated the evening of surgery.  She was afebrile and hemodynamically stable.  She was on a 100% non-  rebreather, postop day #1.  She was also on several drips.  These were  weaned as tolerated.  She was volume overloaded and once her drips were  stopped, diuresis was initiated.  Chest tubes were removed by September 07, 2008.  Followup chest x-ray revealed no pneumothorax, improvement in  pulmonary edema, and left lower lobe atelectasis.  The patient had been  gradually weaned off the non-rebreather and was on several liters of  oxygen via nasal cannula.  The patient had postoperative  thrombocytopenia, platelets gradually increased (last platelet count  148,000).   The patient was started on low-dose Coumadin secondary to high risk of  DVT (heparin not able to be used secondary to thrombocytopenia).  Glucose was well controlled with Lantus, glargine, and sliding scale.  The patient was also found to have postoperative blood loss anemia.  H  and H was monitored closely.  She did not require any postoperative  transfusions.  The patient continued to progress with cardiac rehab and  she continued to improve such that by currently on postop day #6, she is  tolerating diet and she is ambulating.  Preoperative weight 123 kg,  today's weight down to 121.8 kg.  CBGs 101/78/63 respectively.  She is  afebrile.   PHYSICAL EXAMINATION:  VITAL SIGNS:  Stable.  She is 99% O2 sat on room  air, occasionally does use a couple of liters of oxygen via nasal  cannula.   CARDIOVASCULAR:  Regular rate and rhythm, a probable soft systolic  murmur.  PULMONARY:  Decreased bases bilaterally, but clear.  EXTREMITIES:  Mild edema of the lower extremities.  Sternal wound clean,  dry, and continuing to heal.   Provided the patient remains afebrile and hemodynamically stable, she  will be discharged on September 11, 2008.   LABORATORY STUDIES:  BMET done on September 10, 2008:  Potassium 3.8, BUN  and creatinine were 29 and 1.34 respectively.  PT/INR were 16.2 and 1.3  respectively.  CBC:  H and H 9.2 and 27.4 respectively, white count  8300, and platelet count 148,000.  Last chest x-ray done on September 10, 2008 showed stable moderate enlargement of cardiac silhouette, mild  vascular congestion with minimal atelectasis, improvement in aeration of  the left base, and slightly increased haziness of left upper lobe.   DISCHARGE INSTRUCTIONS:  Include the following:  The patient is not to  drive or lift more than 10 pounds.  She is to continue with breathing  exercise daily.  She is to walk every day and increase her frequency and  duration as tolerated.  She is to remain on a low-fat, low-salt,  carbohydrate-modified medium caloric diet.  She is instructed she may  shower.  She may cleanse her wounds with mild soap and water.  She is to  call the office if any wound problems arise.   FOLLOWUP APPOINTMENTS:  1. Obtaining a PT/ INR 48 hours after discharge.  The patient needs to      call [facility] to arrange an appointment.  2. The patient needs to follow up with Dr. Stephannie Peters office      regarding further diabetes management.  3. The patient has appointment to see Tillman Sers, nurse practitioner      for Dr. Amil Amen on October 01, 2008 at 2 p.m.  4. The patient has a followup appointment with Dr. Tyrone Sage on October 03, 2008 at 10:30 a.m.  Prior to this appointment, a chest x-ray      will be obtained.   DISCHARGE MEDICATIONS:  1. Lovastatin 40 mg  p.o. at bedtime.  2. Paroxetine 40 mg p.o. daily.  3. Wellbutrin 300 mg p.o. 2 times daily.  4. Neurontin 300 mg p.o. in the a.m. and 600 mg p.o. in the p.m.  5. Ferrous sulfate 325 mg p.o. 2 times daily.  6. EC ASA 81 mg p.o. daily.  7. Clonazepam 0.5 mg p.o. at bedtime p.r.n.  8. Aricept 10 mg p.o. daily.  9. Namenda 10 mg p.o. 2 times daily.  10.Aranesp injections monthly.  11.Norco 5/325 mg 1-2 tablets q.8 h. p.r.n. pain.  12.Lopressor 12.5 mg p.o. 2 times daily.  13.Coumadin p.o. daily.  Currently dosage is 2.5 mg p.o. daily.      Dosage will be determined at the time of discharge.  14.Also the patient will resume Avandia 2 mg p.o. daily, provided      tolerating p.o. better.      Doree Fudge, [place]      Sheliah Plane, MD  Electronically Signed    DZ/MEDQ  D:  09/10/2008  T:  09/11/2008  Job:  [id number]   cc:   Francisca December, M.D.  Michael L. Thad Ranger, M.D.

## 2011-02-09 NOTE — Discharge Summary (Signed)
[name], [name]                ACCOUNT NO.:  [id number]   MEDICAL RECORD NO.:  [id number]          PATIENT TYPE:  INP   LOCATION:  3712                         FACILITY:  [facility]   PHYSICIAN:  Francisca December, M.D.  DATE OF BIRTH:  [birth year]   DATE OF ADMISSION:  09/16/2008  DATE OF DISCHARGE:  09/24/2008                               DISCHARGE SUMMARY   ADDENDUM:  As I had mentioned earlier, the patient had confusion upon  admission.  Blood cultures were obtained, and neurologic consult was  obtained.  I discovered by further examination of the chart that  neurology had asked that the patient's Namenda, Wellbutrin and Neurontin  be stopped.  The patient was also on Aricept prior to admission.  This  has been stopped, as well.  Therefore, we will not need to continue  these medications as previously outlined in the previous discharge  summary.  In addition, the patient had been using Xanax here at the  hospital, so, because she was on clonazepam at home, I feel that it is  okay for her to take this as needed for anxiety.  She will need to  follow up with her primary care physician to see if the other  medications need to be restarted, but, again, she did come in with  confusion, the medications were stopped, and she went back to her  baseline.      Guy Franco, P.A.      Francisca December, M.D.  Electronically Signed    LB/MEDQ  D:  09/24/2008  T:  09/24/2008  Job:  [id number]

## 2011-02-09 NOTE — Discharge Summary (Signed)
NAMEJUDI, [name]                ACCOUNT NO.:  [id number]   MEDICAL RECORD NO.:  [id number]          PATIENT TYPE:  INP   LOCATION:  3712                         FACILITY:  [facility]   PHYSICIAN:  Francisca December, M.D.  DATE OF BIRTH:  09/29/1932   DATE OF ADMISSION:  09/16/2008  DATE OF DISCHARGE:  09/24/2008                               DISCHARGE SUMMARY   DISCHARGE DIAGNOSES:  1. Acute systolic heart failure, resolved.  2. Coronary artery disease, status post coronary artery bypass      grafting/aortic valve last month.  3. Debilitation.  4. Long-term anticoagulation therapy with Coumadin.  5. History of atrial fibrillation.  6. Sleep apnea, intolerance to continuous positive airway pressure.  7. Paroxysmal atrial fibrillation.  8. Early Alzheimer's dementia.  9. Diabetes mellitus.  10.Hypertension.  11.Chronic anemia.   HOSPITAL COURSE:  [name] is a 77 year old female who was discharged  on September 12, 2008, after an aortic valve replacement and bypass  surgery.  She went home and was ambulating with a walker.  She was sent  home with oxygen as well.  However, after going home, she stopped  participating as much in rehabilitation and slowly declined with  increasing weakness and confusion.  She was brought to the emergency  room..  Her predominant complaint in the emergency room was weakness and  this has been on a gradual decline.  There was no acute obvious etiology  in the emergency room.  Chest x-ray on arrival showed mild interstitial  edema and marked cardiomegaly.   Because of some of the confusion, although she has early Alzheimer's  dementia, neurology saw the patient and a CT of the head shows no acute  intracranial abnormality.  An EEG was also performed and there was no  epileptiform features noted.   The patient was felt to be volume overloaded with acute systolic heart  failure.  She was gradually diuresed with IV diuretics and then switched  over to  p.o.  She tolerated this well.   She was debilitated because of her long hospitalization prior to this  hospitalization and she became more debilitated.  She was working with  home health physical therapy and did well enough that she was not a  candidate for inpatient rehab but it was felt that a SNF was more  appropriate for her on a temporary basis and the plans were for her to  go home after some rehab at that facility.   She did have blood cultures taken during this hospitalization and there  were two sets.  The first set was drawn on September 16, 2008, in efforts  to figure out the etiology of her weakness and confusion.  One out of  two were positive for propionibacterium species.  This was felt to be a  contaminant.  Blood cultures were repeated on September 22, 2008, and one  out of two were positive for gram-positive cocci in clusters.  This was  felt to be contaminant by infectious disease, Dr. Maurice March.  The patient is  clinically stable and ready for discharge to home.  I did speak with Dr.  Maurice March, who felt that it was okay for the patient to be discharged to the  facility.   DISCHARGE LABS:  PT 29.4, INR 2.6, BNP 852 (declined).  Sodium 139,  potassium 3.0, repleted.  BUN 7, creatinine 0.78.  Hemoglobin 10.1,  hematocrit 30.9, white count 6.4, platelets 152.  EKG showed a left  bundle branch block.   DISCHARGE MEDICATIONS:  1. Lovastatin 40 mg a day.  2. Paxil 40 mg a day.  3. Iron 325 mg a day.  4. Baby aspirin 81 mg a day.  5. Lopressor 12.5 mg b.i.d.  6. Coumadin 5 mg a day.  7. Avandia 1 mg twice a day.  8. Lasix 40 mg a day.  9. Potassium 20 mEq a day.  10.Colace 100 mg a day.  11.Namenda 10 mg twice a day.  12.Wellbutrin 300 mg one a day.  13.Neurontin 300 mg in the morning, 600 mg in the evening was      recommended.   1. Ultram 50 mg 1 p.o. q.8 h., p.r.n.  2. Clonazepam 0.5 mg t.i.d. p.r.n.  3. The patient is on Aranesp, I do not know the dose.   The  patient is to remain on a low-sodium heart-healthy diabetic diet.  Increase activity slowly as per physical therapy.  She is to follow up  with Dr. Amil Amen on October 15, 2007 at 9:15 a.m.  She needs to have a  Coumadin/INR check in 1 week.  The can either be done at the skilled  nursing facility or arrangements can be made for the patient to follow  up with Dr. Amil Amen, [phone].      Guy Franco, P.A.      Francisca December, M.D.  Electronically Signed    LB/MEDQ  D:  09/24/2008  T:  09/24/2008  Job:  [id number]   cc:   Francisca December, M.D.

## 2011-02-09 NOTE — H&P (Signed)
[name], [name]                ACCOUNT NO.:  [id number]   MEDICAL RECORD NO.:  [id number]          PATIENT TYPE:  INP   LOCATION:  4731                         FACILITY:  [facility]   PHYSICIAN:  Rollene Rotunda, MD, FACCDATE OF BIRTH:  Jan 06, 1933   DATE OF ADMISSION:  09/15/2008  DATE OF DISCHARGE:                              HISTORY & PHYSICAL   REASON FOR PRESENTATION:  Evaluate patient with confusion and weakness.  She is status post AVR CABG.   HISTORY OF PRESENT ILLNESS:  The patient is 77 years old.  She was  discharged on the 17th after aortic valve replacement and bypass.  Despite her multiple comorbidities, she did relatively well with this.  Her family states she was ambulating with a walker with physical  therapy.  She was sent home on oxygen.  However, after going home, she  stopped participating as much in the rehab.  She has slowly declined  with increasing weakness and confusion.  She has had some increased  dyspnea, though nothing acute.  They have not been describing PND or  orthopnea.  They are not describing fevers, chills or cough.  They said  her appetite is decreased.  She has been sleeping mostly during the day.  She is awake at night.  She is only taking Ultram for pain.  They have  given her clonazepam for sleep.  They report that she has also had some  right-sided chest discomfort.  This has apparently been severe, bringing  the patient to the years, though she is denying pain currently.  The  family reports that she will minimize her symptoms trying to stay out of  the hospital.  She does have some discomfort with movement.  She is not  describing any shortness of breath with breathing.  She has had no  nausea, vomiting or excessive diaphoresis.  There has not been any  description of palpitations, presyncope or syncope.   In the emergency room, there has been no evidence of obvious infection.  Her chest x-ray does show mild edema with small bilateral  effusions.  She is afebrile.  Her white blood cell count is not elevated.  She does  have an elevated BNP of 719.  Her creatinine is up to  1.93 from 1.62.  Her AST and ALT are mildly elevated.  Urinalysis was negative.  A head  CT demonstrated no acute abnormalities.  Cardiac enzymes are negative.   PAST MEDICAL HISTORY:  1. Cardiomyopathy (EF 25-30% recently),  2. Single vessel coronary artery disease.  3. Aortic stenosis.  4. Sleep apnea (the patient has been intolerant of CPAP).  5. Paroxysmal atrial fibrillation.  6. Early Alzheimer's dementia.  7. Diabetes mellitus.  8. Hypertension.  9. Chronic anemia.   PAST SURGICAL HISTORY:  1. Bilateral knee replacements.  2. Femur fracture with repair in 2007.  3. Aortic valve replacement with an [employer]      bioprosthesis.  4. Single vessel CABG with a LIMA to the circumflex.  5. Cholecystectomy.  6. Cataract surgery.   ALLERGIES/INTOLERANCES:  CELEBREX.   MEDICATIONS (AT DISCHARGE):  1. Lovastatin 40 mg daily.  2. Paroxetine 40 mg daily.  3. Wellbutrin 300 mg b.i.d.  4. Neurontin 300 mg q.a.m. and 600 mg q.p.m.  5. Iron 325 mg b.i.d.  6. Aspirin 81 mg daily.  7. Clonazepam.  8. Aricept 10 mg daily.  9. Namenda 10 mg b.i.d.  10.Aranesp injections.  11.Lopressor 12.5 mg b.i.d.  12.Coumadin.  13.Avandia 1 mg b.i.d.  14.Ultram.   SOCIAL HISTORY:  The patient lives with her daughters.  She does not  smoke cigarettes or drink alcohol.  She did work in day care.   FAMILY HISTORY:  Contributory for her father dying at 30.  He had  multiple heart attacks before this.  Her mother died in her 84s of lung  cancer.   REVIEW OF SYSTEMS:  As stated in the HPI and otherwise negative for all  other systems.   PHYSICAL EXAMINATION:  GENERAL:  The patient is somnolent but opens her  eyes and answers questions and seems appropriate.  She is in no  distress.  VITAL SIGNS:  Blood pressure 128/96, heart rate 73 and  regular,  afebrile, respiratory rate 18.  HEENT:  Eyes unremarkable.  Pupils equal, round and reactive to light.  Fundi not visualized.  Oral mucosa unremarkable.  NECK:  No jugular distention at 45 degrees.  Carotid upstroke brisk and  symmetric.  No bruits, no thyromegaly.  LYMPHATICS:  No cervical, axillary or inguinal adenopathy.  LUNGS:  Clear to auscultation bilaterally.  BACK:  No costovertebral angle tenderness.  CHEST:  Well-healed sternotomy scar without erythema, exudate, sternal  mobility.  HEART:  PMI not displaced or sustained.  S1-S2 within normal limits.  No  S3, no S4, no clicks, no rubs, no murmurs.  Distant heart sounds.  ABDOMEN:  Morbidly obese, positive bowel sounds, normal in frequency and  pitch.  No bruits, no rebound, no guarding, no midline pulsatile mass,  no hepatomegaly, no splenomegaly.  SKIN:  No rashes, no nodules.  EXTREMITIES:  2+ pulses throughout.  No edema, no cyanosis, no clubbing.  NEUROLOGIC:  Oriented to person, place and time.  Cranial nerves grossly  intact.  Motor grossly intact.   ELECTROCARDIOGRAM:  Sinus rhythm, first-degree AV block, low voltage in  the limb leads, left bundle bundle-branch block.   CHEST X-RAY:  As above.   LABORATORY DATA:  WBC 10, hemoglobin 9.9, platelets 202.  Sodium 135,  potassium 4.3, BUN 34, creatinine 1.93, INR 2.2, lipase 38.   ASSESSMENT/PLAN:  1. Weaknesses.  This is the patient's predominant complaint.  It is      been a gradual decline.  At this point, there is no acute obvious      etiology.  There is no sign of infection.  She was only taking      apparently Ultram for pain, though she did have some clonazepam.      She does have some baseline altered mental status and multiple      comorbidities.  This may combine to produce her current state.  At      this point, I will admit her for telemetry observation.  Will      follow all vitals, particularly looking for fever.  Will replete      repeat  blood work in the morning including WBCs.  I will check an      ABG to look for her CO2.  I will have to repeat her liver enzymes      in the  morning.  We will encourage physical therapy, occupational      therapy to work with her.  Will encourage incentive spirometry.  We      may need to consider an outpatient rehab facility since she failed      at home.  2. Renal insufficiency.  The patient has some acute-on-chronic renal      insufficiency..  At this point, I will however diurese her gently      with double her dose of p.o. Lasix.  Will have to watch her      creatinine very carefully.  This is acute-on-chronic renal      insufficiency.  Will watch her I&O's carefully.  3. Cardiomyopathy as above.  Long-term, she would benefit from being      on an ACE inhibitor if her renal function will tolerate this.  4. Sleep apnea.  She refuses CPAP.  Will use nasal cannula.  5. Diabetes.  Will cover her with sliding scale and continue her low-      dose Avandia, provided her diet is adequate.  6. Paroxysmal atrial fibrillation.  The patient will remain on      Coumadin.  This is also for her post aortic valve replacement.  7. Alzheimer's dementia.  This is early onset apparently.  She has had      an extensive neurologic workup and had a negative CT of her head in      the ER.  This certainly may contribute to her decline.  8. Anemia.  This is stable and actually slightly better than at      discharge.      Rollene Rotunda, MD, [facility]  Electronically Signed     JH/MEDQ  D:  09/16/2008  T:  09/16/2008  Job:  [id number]

## 2011-02-09 NOTE — Procedures (Signed)
EEG NUMBER:  [id number].   REFERRING PHYSICIAN:  Levert Feinstein, MD   CLINICAL HISTORY:  A 77 year old patient being evaluated for confusion.   MEDICATION LISTED:  Zocor, Paxil, iron, Lopressor, Coumadin, Avandia,  potassium, NovoLog, and Lasix.   This is a portable EEG recorder with the patient awake and asleep using  17-channel machine and standard 10/20 electrode  placement.   Background awake rhythm consists of 9-10 Hz alpha, which is of moderate  amplitude synchronous reactive to eye opening and closure.  Intermittent  high-frequency low-amplitude beta activity seen which is a medication  effect.  Technical study component of study suboptimal with excessive  motion artifacts.  Length of the recording is 22.4 minutes.  Definite  sleep stages are not noted except for mild drowsiness, which is  uneventful.  Hyperventilation not performed.  Photic stimulation  unremarkable.  No definite epileptiform features are noted.   IMPRESSION:  This EEG performed during awake and light sleep is within  normal limits.  No definite epileptiform features were noted.           ______________________________  Sunny Schlein. Pearlean Brownie, MD     ZOX:WRUE  D:  09/17/2008 18:51:15  T:  09/18/2008 04:53:34  Job #:  [id number]   cc:   Levert Feinstein, MD

## 2011-02-09 NOTE — Assessment & Plan Note (Signed)
OFFICE VISIT   KISA, FUJII  DOB:  1932-11-11                                        October 10, 2008  CHART #:  [id number]   The patient returns to the office today in followup after her recent  aortic valve replacement with a pericardial tissue valve and coronary  artery bypass grafting x1 done on 09/04/2008.  She was ultimately  discharged home on 09/11/2008, unknown to me.  She was then 4-5 days  postop, was readmitted because of lethargy and confusion.  CT scan of  the head was performed, without evidence of stroke, and ultimately she  was discharged to [facility] for several weeks and is now back  at home.  She notes that overall her respiratory status is much improved  compared to her status prior to surgery.  Her morbid obesity is severely  limiting her ability to get around in addition to significant  osteoarthritis of her knees, but overall seems to be making reasonable  progress now postoperatively.  Her mental status is much improved.   On physical exam, her blood pressure is 140/72, pulse is 72, respiratory  rate is 18, and O2 sat is 90% on room air.  Her sternum is stable and  well healed.  Bowel sounds are crisp.  I do not appreciate any murmur of  aortic insufficiency.  She has moderate bilateral lower extremity pedal  edema.   She notes that her glucoses have been running high, usually 160 in the  mornings.  She continues on a very extensive medication list, but  particularly she remains on Coumadin.  She notes that she was told in  the [facility] that because of her valve, she would have to stay on  Coumadin the rest of her life.  Unfortunately, this is not correct.  I  am not sure if she has misunderstood, but if she remains in sinus rhythm  could have the Coumadin stopped in 6-8 weeks postop.   Her medical doctor suddenly retired last month, and so far, she has not  been back to see a regular medical doctor; however, I  have encouraged  her to do this very soon to assist her in her very long chronic  medications and management of her diabetes.  Overall, considering her  moribund  preoperative state, she is making very good progress postoperatively.  I  will be glad to see her at Dr. Amil Amen' or her new medical doctor's  request at any time.   Sheliah Plane, MD  Electronically Signed   EG/MEDQ  D:  10/10/2008  T:  10/10/2008  Job:  [id number]   cc:   Francisca December, M.D.

## 2011-02-09 NOTE — Consult Note (Signed)
NAMEREBACCA, VOTAW                ACCOUNT NO.:  [id number]   MEDICAL RECORD NO.:  [id number]          PATIENT TYPE:  INP   LOCATION:  2036                         FACILITY:  [facility]   PHYSICIAN:  Levert Feinstein, MD          DATE OF BIRTH:  [birth year]   DATE OF CONSULTATION:  DATE OF DISCHARGE:                                 CONSULTATION   REASON FOR CONSULTATION:  Preoperative observation.   HISTORY OF PRESENT ILLNESS:  A 77 year old white female known to Dr.  Thad Ranger for Alzheimer dementia.  During last visit on August 13, 2008, patient's daughter had noticed problems after the death of the  patient's husband in December 2005.  She continued to have memory  problems despite the treatment of antidepressants.  Workup included CT  and TSH which were unremarkable and B12 level which was borderline at  that time.  Patient had a sleep apnea study and was diagnosed with sleep  apnea, however, had difficulty tolerating the CPAP.  On August 13, 2008, patient scored a 23/30 points missing one on orientation question  and 5 points on calculation animal fluency test.  Patient had been  started on Aricept and Namenda, both of which were started by her  primary physician.   In addition, on August 13, 2008, patient's daughter had stated that  over the few weeks prior she had developed unsteady gait with increasing  confusion.  This, however, was around the same time that she had  complained of eye problems.  She apparently went to see her eye doctor  and was given prescription for eye drops.  These eye drops helped but  there was a concern on the patient's part that these might have made her  vision a little bit blurry.  Daughter had also noticed some difficulty  in word finding at that time.  At that time, Dr. Thad Ranger had stated  that he was going to obtain a CBC, CMET, TSH, B12, and urinalysis.  He  would also check a chest x-ray, PA and lateral, due to the cough.  He  did not rule out  that she possibly had a TIA but if all tests were  negative he stated there was a low threshold for looking for  neuroimaging studies.   Today, patient is consulted secondary to coming in to the hospital  having shortness of breath and dyspnea on exertion over the past 2 to 3  weeks.  Dr. Amil Amen had requested both past medical history notes and  for neurology to consult prior to patient having open heart surgery and  aortic valve replacement.  At time of interview, patient was alert and  oriented and very fluent with her speech, was not complaining of any  dizziness, shortness of breath, double vision, blurred vision, decreased  sensation, diplopia, paresthesia, or weakness.  Patient at this time is  still considering having open heart surgery.   PAST MEDICAL HISTORY:  1. Hypertension.  2. Diabetes.  3. Chronic anemia.  4. Obstructive sleep apnea.  5. Severe aortic stenosis.  6. Obesity.  7. Paroxysmal atrial fib.   PAST SURGICAL HISTORY:  1. She had a bilateral total knee replacement, 2000.  2. Right femoral rod placement, 2007.  3. In 1999, she had cholecystectomy.   MEDICATIONS:  At home, she is on:  1. Lovastatin 40 mg daily.  2. Paroxetine 40 mg q.h.s.  3. Wellbutrin 300 mg daily.  4. Neurontin 300 mg in the a.m., 600 mg q.h.s.  5. Ferrous sulfate 325 b.i.d.  6. Aspirin 81 mg daily.  7. Lisinopril/hydrochlorothiazide.  8. Klonopin 0.5 mg q.h.s.  9. Aricept 10 mg daily.  10.Namenda 10 mg b.i.d.  11.Avandia 2 mg b.i.d.  12.Norco p.r.n.   ALLERGIES:  NONE.   FAMILY HISTORY:  Patient's mother had history of lung cancer and  hypertension.  Father died at 55 due to an MI and has a brother who has  multiple sclerosis.   REVIEW OF SYSTEMS:  Were negative with the exception of above.   PHYSICAL EXAM:  This is a pleasant, Caucasian, 77 year old female.  Blood pressure 125/70.  Pulse 86.  Respiratory rate 20.  Temperature 98.  MENTAL STATUS:  She is alert, oriented, can  carry out 2 and 3-step  commands.  She has fluent speech, fluent thought process, and acts  appropriately at this time.  CRANIAL NERVES:  Pupils are equal, round, reactive, and accommodating to  light bilaterally, 3 mm to 2 mm.  Conjugate gaze.  Extraocular muscles  are intact.  She has positive left pseudoptosis.  Visual fields are  intact.  Face is symmetrical.  Tongue midline.  Uvula midline.  No  dysarthria.  No dysphagia.  No slurred speech.  Sensation, V1 through  V3, of the facial nerves is normal.  Shoulder shrug, head turn are  within normal limits.  Coordination, finger to nose within normal limits  and smooth pursuit.  Heel to shin is within normal limits.  Fine motor  movements are slow, however, within normal limits.  Rapid alternating  movements are normal, however, slow.  Gait, patient is slightly unsteady  on her feet.  She has a slowed, wide-based gait.  No significant  disequilibrium was noted.  Deep tendon reflexes, she has 2+ globally on  the upper extremities, 1+ at the patella, 0 at the Achilles, downgoing  toes bilaterally.  Motor is 5/5 strength globally.  She has good tone,  however, decreased bulk.  Positive for essential tremor, bilateral upper  extremities.  Negative for clonus or asterixis.  Drift is negative,  upper and lower extremities.  Sensation is full to pinprick, light  touch, and vibration throughout.  PULMONARY:  Has positive wheezing, bilateral lower lobes, clear upper  lobes.  She does have some rhonchus along the tracheal region.  CARDIOVASCULAR:  S1 and S2, regular rate and rhythm at this time.  NECK:  Negative for bruits and supple.   LABS:  PTT is 31, INR 1.3, PT is 16.4, sodium 138, potassium 2.9,  chloride 100, CO2 is 32, BUN 15, creatinine 1.19, glucose is 130, white  blood cells 6.3, hemoglobin and hematocrit 11.0 and 32.5, platelets are  100.3.   IMAGING AND TESTS:  Patient recently had a 2D echo which showed left  ventricular ejection  fraction 25% to 30% with positive dyskinesis of the  basal mid anteroseptal portion with positive aortic valve moderate to  markedly calcified and stenotic.  CT shows negative for acute bleed or  mass.  Patient's prediction of stroke on the ABCD squared score is 4%  within  the next 90 days.   ASSESSMENT:  At this time, pleasant 77 year old white female with  Alzheimer dementia, hypertension, diabetes, obesity, obstructive sleep  apnea, and severe aortic stenosis.  Patient scored a 28/30 on the Mini-  Mental exam.  Patient only has mild cognitive dysfunction.  However, if  patient is to continue and decide to go further with the open heart  surgery, would recommend MRI, MRA, and carotid Doppler.  Patient  certainly has multiple vascular risk factors.     ______________________________  Felicie Morn, PA-C      Levert Feinstein, MD  Electronically Signed    DS/MEDQ  D:  08/29/2008  T:  08/29/2008  Job:  [id number]

## 2011-02-09 NOTE — Consult Note (Signed)
NAMEEMARY, [name]                ACCOUNT NO.:  [id number]   MEDICAL RECORD NO.:  [id number]          PATIENT TYPE:  INP   LOCATION:  2036                         FACILITY:  [facility]   PHYSICIAN:  Sheliah Plane, MD    DATE OF BIRTH:  December 14, 1932   DATE OF CONSULTATION:  DATE OF DISCHARGE:                                 CONSULTATION   PRIMARY CARE PHYSICIAN:  Was Dr. Boneta Lucks, currently none.   NEUROLOGIST:  Marolyn Hammock. Thad Ranger, M.D.   REASON FOR CONSULTATION:  Aortic stenosis.   HISTORY OF PRESENT ILLNESS:  The patient is a 77 year old female who has  had a general decline in health over the past several years.  Currently  lives with her daughter and son-in-law with a very limited lifestyle  primarily of standard home.  Her daughter notes that she sleeps a lot  and has been followed with a heart murmur for the past 30 years, but at  least since July that she has been known to have severe aortic stenosis  with velocity across the aortic valve of greater than 5 m/sec.  Over the  past 2 to 3 weeks, her daughter notes that the patient has had  increasing shortness of breath with minimal exertion, dizziness,  unsteadiness on her feet.  Over several years she has had mental status  changes and has been diagnosed with early Alzheimer's and the daughter  notes she has processing problems.  Two weeks ago she had episode of  slurred speech, slumped in the chair, which she responded and was  evaluated by Dr. Thad Ranger and was told that she did not have a TIA, but  from the family's history it is unclear if any specific diagnosis was  made.   The patient was admitted earlier this week because of similar symptoms,  but also because of 30-pound weight gain and severe dyspnea on exertion,  admitted with heart failure, BNP of greater than 800.   PAST CARDIAC HISTORY:  The patient has had no prior myocardial  infarction, no prior cardiac surgery in the past.  Cardiac risk factors  include history  of hypertension, history of hyperlipidemia, 10- to 12-  year history of diabetes.  The family thinks the last hemoglobin A1c was  4.9.  She is a nonsmoker, has no family history of aortic valve disease.  It is unclear if the patient has had a previous stroke.  She did have  some mental status changes 2 weeks ago.  She denies claudication.   PAST MEDICAL HISTORY:  Significant for obesity, longstanding chronic  anemia treated with monthly Aranesp shots, unclear etiology, evaluated  by Dr. Myna Hidalgo.  History of paroxysmal atrial fibrillation though this  is not well documented to the extent.  She had been diagnosed with  obstructive sleep apnea, was started on CPAP mask, but does not use it  any more.  History of recurrent GI bleeds, presumably from hemorrhoids  according to the patient.  This has been evaluated by Dr. Anselmo Rod.   PAST SURGICAL HISTORY:  Bilateral knee replacements, right femoral  fracture repair, and  history of cholecystectomy.   SOCIAL HISTORY:  The patient is widowed, lives with her daughter and son-  in-law.  Does not drive.  Does not use alcohol.   MEDICATIONS AT THE TIME OF ADMISSION:  1. Lovastatin 40 mg a day.  2. Paroxetine 40 mg at bedtime.  3. Wellbutrin XL 300 a day.  4. Neurontin 300 in the morning, 600 in the evening.  5. Iron sulfate 325 mg b.i.d.  6. Aspirin 81 mg a day.  7. Lisinopril/hydrochlorothiazide 20/12.5 mg once a day.  8. Clonazepam 0.5 mg at bedtime.  9. Aricept 10 mg a day.  10.Avandia 2 mg a day.  11.Namenda 10 mg twice a day.  12.Aranesp injections once a month.  13.Norco 5/325 mg p.r.n.   ALLERGIES:  No known allergies.   FAMILY HISTORY:  The patient's father died at age 38, and had myocardial  infarctions prior to this, but no known valve disease, he had diabetes.  The patient's mother died at age 72 with a history of lung cancer and  hypertension.  The patient has two living sisters, one with hypertension  and another  with diabetes.  One brother died at age 44 of multiple  sclerosis.   PHYSICAL EXAMINATION:  GENERAL:  At this time, the patient's usual  weight is 260, 5 feet 4 inches tall.  At the time of admission, her  weight was recorded at 299.  The patient is very chronic appearing,  morbidly obese female who comes easily confused in discussion, but is  oriented to person, place, and time.  During the exam, she on numerous  occasions coughed up significant thick brown colored sputum.  LUNGS:  She has diffuse wheezing throughout both lung fields.  HEART:  Heart murmur consistent with aortic stenosis heard along the  right sternal border.  ABDOMEN:  Abdominal exam is really impossible to examine because of  obesity.  No abdominal tenderness or masses are noted, but this could  easily be missed.  EXTREMITIES:  Femoral pulses were not easily  palpable.  She has  significant obesity of both lower extremities.  Distal pulses are 1+.  She has chronic venous stasis changes in both lower legs.   LABORATORY FINDINGS:  Creatinine of 1.2, potassium of 3.1, on 3 liters  O2 sats of 95%.  Echocardiogram was done in the hospital.  Aortic valve  area is estimated 0.43 with a peak gradient estimated at 67 mm.  Left  ventricle is mildly dilated.  Ejection fraction estimated in the 25% to  30% range with mild-to-moderate mitral regurgitation.   IMPRESSION:  The patient is admitted with congestive heart failure with  known severe aortic stenosis with sleep apnea, morbid obesity, markedly  decrease in her overall functional status, mental status changes,  question severity of duration, question early Alzheimer's.  Now with  active wheezing with significant brown stain sputum production, all  consistent with purulent bronchitis.   SUGGESTION:  The patient obviously has sever aortic stenosis, and aortic  valve replacement was entertained and discussed with the family.  Her  status of the coronary arteries was not  known yet and would need to be  known prior to surgery.  In addition, the patient's  pulmonary/respiratory status needs to be markedly improved before we  consider an aortic valve replacement.  Neurology consult should be  obtained from Dr. Thad Ranger to update this on his thoughts on the  patient's overall neurologic diagnosis which may enter into the overall  discussion of  their appropriateness of major surgery in this debilitated  patient.      Sheliah Plane, MD  Electronically Signed     EG/MEDQ  D:  08/28/2008  T:  08/29/2008  Job:  [id number]   cc:   Casimiro Needle L. Thad Ranger, M.D.  Francisca December, M.D.

## 2011-02-09 NOTE — Consult Note (Signed)
[name], [name]                ACCOUNT NO.:  [id number]   MEDICAL RECORD NO.:  [id number]          PATIENT TYPE:  INP   LOCATION:  2904                         FACILITY:  [facility]   PHYSICIAN:  Levert Feinstein, MD          DATE OF BIRTH:  18-Nov-1932   DATE OF CONSULTATION:  09/16/2008  DATE OF DISCHARGE:                                 CONSULTATION   CHIEF COMPLAINT:  Confusion.   HISTORY OF PRESENT ILLNESS:  This is a 77-year white female known to Dr.  Thad Ranger for Alzheimer dementia recently seen by Dr. Terrace Arabia for  perioperative observation prior to open heart surgery.  Approximately 1-  1/2 weeks status post surgery, surgery being critical, aortic stenosis,  congestive heart failure, surgical procedure done was aortic valve  replacement, [employer] pericardial tissue valve, model 3000  TFX, 23 mm, serial #[id number] and coronary artery bypass graft x1 with  left internal mammary artery to the circumflex coronary artery.  Surgeon  was Dr. Sheliah Plane.  Daughter states that the patient went home  with no difficulties.  At the time of discharge on September 12, 2008,  the patient was walking 13 feet with PT, ambulating well with a walker,  able to take part in ADLs by herself.  Approximately on September 14, 2008, the patient started to show some decline in mental status having  difficulty with doing activities of daily living and having basic  questions which she knew answers to.  On September 15, 2008, daughter  noted that the patient was acting significantly different and starting  to have visual hallucinations.  Over the night of September 15, 2008 to  September 16, 2008, daughter noted that the patient was increasing in  tremor, very agitated, unable to recognize individuals that she knew,  having visual and auditory hallucinations.  At that time, daughter  brought the patient to the emergency room and she was admitted.  At time  of consultation, the patient was agitated.  She  had difficulty  maintaining current straight thought process, was easily distracted.  The patient was not able to finish a sentence, had a very saccadic  voice, was unable to follow commands, however, at times was able to  answer who her daughter was and where she was.  It should be noted that  approximately 3 hours later, the patient was again observed by Dr. Terrace Arabia  and I myself, Felicie Morn, and the patient was significantly more  agitated, unable to follow multiple commands, cool, clammy, and  diaphoretic.   PAST MEDICAL HISTORY:  1. Hypertension.  2. Diabetes.  3. Anemia.  4. Cardiomyopathy.  5. Aortic stenosis.  6. Obstructive sleep apnea.  7. Paroxysmal atrial fibrillation.  8. Alzheimer disease.   MEDICATIONS:  1. Aspirin 81.  2. Wellbutrin 300 mg daily.  3. Aricept 10 mg daily.  4. Lasix 40 mg daily.  5. Neurontin 600 mg at bedtime and 300 mg daily.  6. Insulin 1-11 units subcu q.4 h.  7. Namenda 10 mg b.i.d.  8. Lopressor 12.5 mg b.i.d.  9. Paxil  40 mg p.o. daily.  10.Avandia 1 mg b.i.d.  11.Zocor 20 mg p.o. daily.  12.Warfarin protocol while in hospital.   ALLERGIES:  CELEBREX.   SOCIAL HISTORY:  The patient does not smoke, does not drink, does not do  illicit drugs.  She lives with her daughter.   REVIEW OF SYSTEMS:  All 12 review of systems were negative with the  exception of above.   PHYSICAL EXAMINATION:  VITAL SIGNS:  Blood pressure was 132/73, pulse  73, respirations 20, temperature 97.3.  MENTAL STATUS:  On initial exam, she was alert, she was oriented to  place and person, although it was very difficult for the patient to  remain concentrated on tasks and she was easily distracted.  On second  visit, the patient was significantly more agitated, unable to follow  commands, did not acknowledge individuals in the room, and unable to  hold the conversation.  Cranial nerves:  Pupils were equal, round,  reactive to light.  Left fundus was sharp.  She did  have a conjugate  gaze.  Extraocular muscles were saccadic in movement.  At one point, her  left eye became significantly exophthalmic.  The patient did not blink  to visual threat.  She was able to state that two or three fingers were  being held in the air.  Face has a slight right facial droop.  Tongue  was midline.  On first exam, the patient's V1 through V3 was within  normal limits, however, second exam was unable to detect it secondary to  patient being unable to answer.  Coordination:  Finger-to-nose, heel-to-  shin were unable to be appreciated secondary to patient being apraxic.  Motor movements:  The patient does have a significant resting tremor in  upper and lower extremities with 4/5 strength.  When asking the patient  to hold arms out in front of her body on first exam, the patient had no  difficulty.  On second exam, there was a slight left arm drift noted.  The patient had decreased bulk, but she had good tone throughout.  She  did move all extremities purposefully and spontaneously.  The patient's  deep tendon reflexes were 2+ throughout the upper extremity, zero at the  Achilles and patella bilaterally, and downgoing toes.  Sensation:  The  patient withhold all extremities from pain.  She stated she felt the  pinprick and light touch, however, she did have significant poor  extinction and she was unable to distinguish left from right.  PULMONARY:  Clear to auscultation bilaterally.  CARDIOVASCULAR:  S1 and S2 were audible.  No bruits were noted at the  carotid level.  NECK:  Supple.   LABORATORY DATA:  The patient had sodium 136, potassium 4.1, chloride  100, CO2 was 23, BUN 34, creatinine was 1.89, and glucose 127.  White  blood cell 9.9, platelets 182, hemoglobin/hematocrit 9.4 and 29.5.  AST  was 86, ALT was 67.   IMAGING AND TESTS:  CT was negative.   ASSESSMENT:  This is a 77 year old female approximately 1-1/2 weeks  status post open heart surgery now with  sudden onset decompensation in  memory and altered mental status.  We were unable to obtain an MRI  secondary to sternotomy and closure with a wire at this time. Likely  causes can be cerebrovascular accident, toxic metabolic encephalopathy  due to polypharmacy, which increases delirium with baseline dementia.   At this time, we will order a stat CT, stat blood cultures x3,  TSH, B12,  EEG, folate, and 2-D echo.  We will discontinue Wellbutrin, Neurontin,  Aricept, and Namenda.  The patient is being transferred to Acute Care  for closer monitoring and this was discussed with Dr. Donnie Aho.  We will  continue to follow this patient closely while in hospital.     ______________________________  Felicie Morn, PA-C      Levert Feinstein, MD  Electronically Signed    DS/MEDQ  D:  09/16/2008  T:  09/17/2008  Job:  [id number]   cc:   Francisca December, M.D.

## 2011-02-12 NOTE — Discharge Summary (Signed)
NAME:  [name], GRAVER                          ACCOUNT NO.:  [id number]   MEDICAL RECORD NO.:  [id number]                   PATIENT TYPE:  INP   LOCATION:  0471                                 [facility]ACILITY:  [facility]   PHYSICIAN:  John L. Rendall, M.D.               DATE OF BIRTH:  Sep 19, 1933   DATE OF ADMISSION:  02/03/2004  DATE OF DISCHARGE:  02/08/2004                                 DISCHARGE SUMMARY   ADMISSION DIAGNOSES:  1. End-stage osteoarthritis right knee.  2. Recent bronchitis/sinusitis on Avelox.  3. Diabetes mellitus type 2.  4. Hypertension.  5. Aortic stenosis with left bundle branch block, normal stress test,     ejection fraction at 62%.  6. Anxiety.  7. Obesity.   DISCHARGE DIAGNOSES:  1. Status post right total knee arthroplasty.  2. Acute blood loss anemia requiring 2 units of packed red blood cells.  3. Acute respiratory failure and hypotension most likely due to narcotics,     resolved.  4. Postop hyponatremia resolved.  5. Acute renal failure improved after holding ACE and hydrochlorothiazide     and undergoing packed red blood cell transfusion.  6. Recent bronchitis/sinusitis on Avelox.  7. Diabetes mellitus type 2.  8. Hypertension.  9. Aortic stenosis with left bundle branch block, normal stress test,     ejection fraction 62%.  10.      Anxiety.  11.      Obesity.   HISTORY OF PRESENT ILLNESS:  [name] is a 77 year old white female with a  several year history of progressively worsening right knee pain.  The pain  is described by the patient as a hurting, aching sensation diffusely about  the knee.  The pain is worse with any weightbearing activities and bending.  The patient denies any night pain.  She does have occasional popping and  grinding; however, the knee does not give-way.  The pain through the knee  radiates up into the thigh.  She denies any previous injuries or surgeries  to the right knee.  X-rays of the right knee reveal bone-on-bone  end-stage  osteoarthritis of the medial compartment with significant subluxing  medially.   ALLERGIES:  No known drug allergies.   CURRENT MEDICATIONS:  1. Lisinopril HCTZ 20/25 mg p.o. daily.  2. Glipizide 10 mg p.o. daily.  3. Atenolol 12.5 mg p.o. daily.  4. Metformin 1000 mg p.o. b.i.d.  5. Tylenol p.r.n.  6. Paxil 20 mg p.o. daily.  7. Mucinex 600 mg p.o. b.i.d.  8. Norvasc 10 mg p.o. daily.  9. Avelox 400 mg p.o. daily started May 4th for a total of 7 days.   SURGICAL PROCEDURE:  The patient was taken to the operating room by Dr. Erasmo Leventhal assisted by Rexene Edison, P.A.-C and under spinal anesthesia a right  LCS total knee replacement was performed.  The following exponents were  used:  a size  3 tibia, 10 mm bearing, standard femur and a 3 patella.  The  patient tolerated the procedure well and returned to the recovery room in  good stable condition.   CONSULTATIONS:  The following consults were obtained while the patient was  in the hospital:  PT/OT, [facility] hospitalists and Case  Management.   HOSPITAL COURSE:  The patient developed a respiratory failure secondary to  narcotics, also became hypotensive.  The patient was given Narcan and her  level of consciousness returned back to baseline. The patient also has acute  renal failure.  Baseline blood pressure medicines held and renal failure  improved.  The patient's renal failure also improved after receiving 2 units  of packed red blood cells.  The patient was given 2 units of packed red  blood cells due to postop anemia.   The patient also developed hyponatremia.  This was treated and resolved.  The patient's diabetes mellitus was also treated by [facility]  during hospital stay.   LABORATORY DATA:  Routine labs on admission:  CBC:  White blood cells 5.2,  hemoglobin 11.6, hematocrit 34.3.  His discharge hemoglobin was 10,  hematocrit at 29.5.  Coags on admission were within normal limits.   Routine  chemistries on admission all values were within normal limits except for  glucose which was 207.  Routine chemistries at the time of discharge:  Potassium was at 3.4, glucose 172, otherwise all values were within normal  limits.  Hepatic enzymes on admission all within normal limits.  Urinalysis  on admission was negative.  Urinalysis performed on Feb 05, 2004:  Bilirubin  small, ketones trace, protein 30, leukocyte esterase trace, epithelial cells  many and bacteria few.  Blood gas performed on Feb 05, 2004 at 2:01:  O2 3,  pH 7.196, PCO2 53, PO2 137, bicarb 19.8, total CO2 was 19.4, base deficit  7.7.  The second blood gas O2 was at 3, pH 7.269, PCO2 38.4, PO2 of 83.7,  bicarb 17, total CO2 was 16.4, base deficit at 8.7, oxygen saturation 96.5  and the patient's temp was at 98.6.   EKG on admission performed on Feb 05, 2004 showed normal sinus rhythm with  left bundle branch block.  Two-view chest x-ray performed on Jan 29, 2004  showed ill-defined focal opacity in the right mid lung superimposed on  chronic interstitial lung disease. It was felt that this finding may just be  related to scarring but comparison to previous chest x-ray if available was  recommended.  Otherwise, a noncontrast CT scan of the chest was not  indicated.  Chest two-view performed on Feb 05, 2004 showed resolution of  patchy right upper lobe infiltrates.  No active disease presently.  Doppler  performed on Feb 05, 2004 due to pain and swelling status post right total  knee replacement was read as technically difficult due to the patient's body  habitus but no obvious DVT, SVT, or Baker cyst bilaterally.   DISCHARGE INSTRUCTIONS:  1. Medications:  The patient is to resume home meds and add the following:     a. Percocet 5 mg one to two tabs every 4-6 hours as needed for pain.     b. Lovenox one injection daily for 10 days. 2. Activity:  Out of bed, weightbearing as tolerated right leg with walker.      Home health PT with [facility], CPM 0-90 degrees 6 to 8 hours a day.  3. Wound care:  The patient is  to keep the wound clean and dry.  May shower     after 2 days with no drainage.  Notify Dr. Priscille Kluver of any temperature     greater than 101.5, chills, pain not relieved by meds or foul and smelly     drainage from the wound.  4. Diet:  No restrictions.   DISCHARGE:  The patient was discharged  home in good and stable condition.  The day of discharge the patient was afebrile and vital signs were stable.   FOLLOW UP:  1. The patient needs a follow up with Dr. Priscille Kluver in his office     approximately 10 days from discharge.  The patient is to call the office     at [phone] for an appointment.  2. The patient is to contact medical physician to evaluate the patient's     diabetes medicines.  CBGs during hospital stay 170s to 360s.      Richardean Canal, P.A.                       John L. Priscille Kluver, M.D.    Leanora [name]  D:  03/23/2004  T:  03/23/2004  Job:  [id number]   cc:   Talmadge Coventry, M.D.  [address]  [place]  [place] [address]  Fax: [phone]

## 2011-02-12 NOTE — Op Note (Signed)
NAME:  [name], [name]                          ACCOUNT NO.:  [id number]   MEDICAL RECORD NO.:  [id number]                   PATIENT TYPE:  INP   LOCATION:  X006                                 FACILITY:  [facility]   PHYSICIAN:  John L. Rendall III, M.D.           DATE OF BIRTH:  11-Sep-1933   DATE OF PROCEDURE:  02/03/2004  DATE OF DISCHARGE:                                 OPERATIVE REPORT   PREOPERATIVE DIAGNOSIS:  Osteoarthritis, right knee.   POSTOPERATIVE DIAGNOSIS:  Osteoarthritis, right knee.   OPERATION/PROCEDURE:  Right LCS total knee replacement.   SURGEON:  John L. Rendall, M.D.   ASSISTANT:  Richardean Canal, P.A.-C.   ANESTHESIA:  Spinal.   PATHOLOGY:  The patient has bone against bone medially, right knee, with  femur subluxing over the tibia 1.5 cm.  This has been chronic.  Pain has  been unremitting despite all conservative measures.  In addition, she is  quite heavy with a two-inch panniculus up and down the entire length of the  leg.   DESCRIPTION OF PROCEDURE:  Under spinal anesthesia, the right leg was  prepared with DuraPrep and draped as a sterile field.  Sterile tourniquet  was placed on the proximal thigh.  Leg is wrapped out with the Esmarch and  the tourniquet is used at 400 mmHg.  Midline incision is made.  Dissection  is carried through two inches of panniculus to reach the patella.  Medial  parapatellar incision is made.  The patella is everted.  The femur is sized.  It is standard.  Using the first tibial guide, a proximal tibial resection  is carried out.  The posterior cruciate is not saved.  Using the first  femoral guide, an intercondylar drill hole is placed.  Using the second  guide, after balancing the flexion gap at 10 mm, interior posterior flare of  the distal femur are resected.  The intramedullary guide is then used and a  distal femoral cut is made and remade twice, finally getting rid of her  flexion contracture and 10 mm extension gap is  obtained.  Recessing guide is  then used.  The lamina spreader is then inserted.  Remnants of the menisci  and cruciates are resected.  Spurs were taken off the back of the femoral  condyles and quite extensive ones medially.  Once this is completed, the  tibia is sized to a #3.  A center peg hole is placed with fins.  Trial  reduction of #3 tibia, 10 bearing, standard femur reveals excellent fit and  alignment.  Three peg patella is then placed after osteotomizing the  patella.  With this in place, excellent fit, alignment and stability are  encountered.  Permanent components are obtained while the bony surfaces are  prepared with pulse irrigation.  There is some eburnated bone medially which  is drilled first the accept cement prior to application of cement.  All  components are cemented in place.  Once this hardened, the tourniquet is let  down at one hour.  Then medium Hemovac drain is inserted.  Blood loss is  less than 100 mL.  The knee is then closed in layers with #1 Ethibond, 0 and  2-0 Vicryl and skin clips.  The patient was returned to recovery in good  condition.                                               John L. Dorothyann Gibbs, M.D.   Renato Gails  D:  02/03/2004  T:  02/03/2004  Job:  [id number]

## 2011-02-12 NOTE — Op Note (Signed)
NAME:  [name], [name]                ACCOUNT NO.:  [id number]   MEDICAL RECORD NO.:  [id number]          PATIENT TYPE:  AMB   LOCATION:  ENDO                         FACILITY:  [facility]   PHYSICIAN:  Anselmo Rod, M.D.  DATE OF BIRTH:  03-Apr-1933   DATE OF PROCEDURE:  06/09/2005  DATE OF DISCHARGE:                                 OPERATIVE REPORT   PROCEDURE PERFORMED:  Esophagogastroduodenoscopy with multiple biopsies.   ENDOSCOPIST:  Anselmo Rod, M.D.   INSTRUMENT USED:  Olympus video panendoscope.   INDICATIONS FOR PROCEDURE:  A 77 year old white female with a history of  occasional bright red blood per rectum and anemia, rule out peptic ulcer  disease, esophagitis, gastritis, etc.   PREPROCEDURE PREPARATION:  Informed consent was procured from the patient.  The patient fasted for eight hours prior to the procedure. She received a  gram of Ancef for endocarditis prophylaxis prior to the procedure.   PREPROCEDURE PHYSICAL EXAMINATION:  VITAL SIGNS:  Stable.  NECK:  Supple.  CHEST:  Clear to auscultation.  CARDIOVASCULAR:  S1 and S2 regular.  ABDOMEN:  Soft with normal bowel sounds.   DESCRIPTION OF PROCEDURE:  The patient was placed in left lateral decubitus  position, sedated with 70 mg of Demerol and 7.5  mg of Versed in slow  incremental doses.  Once the patient was adequately sedated and maintained  on low flow oxygen and continuous cardiac monitoring, the Olympus video  panendoscope was advanced through the mouthpiece over the tongue and into  the esophagus under direct vision.  A small papilloma was biopsied from 20  cm in the proximal esophagus.  The rest of the esophagus appeared normal and  so does the Z line.  The scope was then advanced into the stomach.  No  abnormalities were noted.  On retroflexion in the high cardia, a few antral  erosions were noted and biopsies were done to rule out H. pylori by  pathology.  Small-bowel appeared normal up to 60 cm.   Small-bowel biopsies  were done to rule out sprue.  The patient tolerated the procedure well  without immediate complications.   IMPRESSION:  1.  Small papilloma biopsied from 20 cm in the esophagus.  2.  Antral erosions, biopsies done for Helicobacter pylori.  3.  Small-bowel biopsy done to rule out sprue.   RECOMMENDATIONS:  1.  Await pathology results.  2.  Avoid nonsteroidals including aspirin for now.  3.  Proceed with colonoscopy at this time.  Further recommendations will be      made thereafter.      Anselmo Rod, M.D.  Electronically Signed     JNM/MEDQ  D:  06/09/2005  T:  06/09/2005  Job:  [id number]   cc:   Talmadge Coventry, M.D.  [address]  [place]  [place] [address]  Fax: [phone]

## 2011-02-12 NOTE — Consult Note (Signed)
[name], [name]                ACCOUNT NO.:  [id number]   MEDICAL RECORD NO.:  [id number]          PATIENT TYPE:  INP   LOCATION:  0154                         FACILITY:  [facility]   PHYSICIAN:  Myrtie Neither, MD      DATE OF BIRTH:  22-Aug-1933   DATE OF CONSULTATION:  03/27/2006  DATE OF DISCHARGE:                                   CONSULTATION   ORTHOPEDIC CONSULTATION NOTE   REFERRING PHYSICIAN:  [facility] team [facility], Dr. Brien Few.   REASON FOR CONSULTATION:  Right distal femoral fracture.   PERTINENT HISTORY:  This is a 77 year old female who had a fall today.  The  patient has a history of diabetes, high blood pressure and morbid obesity.  The patient states her right foot got caught, and tripped her and she landed  onto her face, and right lower extremity.  The patient complained of right  knee and right hip pain.  The patient was admitted to the [facility]  Emergency Room and admitted to the hospital for hip, and leg pain.  The  patient had x-ray done of the right knee and was found to have a  supracondylar fracture with total knee implant in place.   PAST MEDICAL HISTORY:  1.  Bilateral knee replacements.  2.  Diabetes mellitus.  3.  High blood pressure.  4.  Colonoscopy.  5.  EGD.  6.  Cholecystectomy.  7.  Cataract surgery, bilateral.  8.  The patient also has a cardiac murmur.  9.  Anxiety.  10. Dementia.   ALLERGIES:  CELEBREX.   MEDICATIONS:  Avandia, Darvocet-N, Lantus, lisinopril, lovastatin, Neurontin  and Paxil.   REVIEW OF SYSTEMS:  Left shoulder problem with rotator cuff.  No urinary or  bowel symptoms.   PHYSICAL EXAMINATION:  VITAL SIGNS:  Temperature 97.6, pulse 96,  respirations 22 and blood pressure 138/56.  Weight 319.  HEAD:  Normocephalic.  Abrasion over the nasal bridge.  Minimal tenderness.  GENERAL:  An extremely obese patient, lying in supine position.  EXTREMITIES:  Right knee:  Tender, swollen and old anterior surgical scar.  Calf is soft and  nontender.  BACK:  Tender in the lower lumbar and right SI joint.   X-ray revealed right total knee in place with supracondylar fracture.  That  is of the right femur.   IMPRESSION:  1.  Fractured right distal femur with total knee implant in place.  2.  Morbid obesity.  3.  High blood pressure.  4.  Diabetes.  5.  Cardiac murmur.  6.  Osteoporosis.  7.  History of aortic stenosis.   RECOMMENDATIONS:  ORIF for right distal femoral fracture after cardiac and  medical clearance.      Myrtie Neither, MD  Electronically Signed     AC/MEDQ  D:  03/27/2006  T:  03/28/2006  Job:  [id number]

## 2011-02-12 NOTE — Op Note (Signed)
[name], [name]                ACCOUNT NO.:  [id number]   MEDICAL RECORD NO.:  [id number]          PATIENT TYPE:  INP   LOCATION:  2307                         FACILITY:  [facility]   PHYSICIAN:  John L. Rendall, M.D.  DATE OF BIRTH:  08/31/1933   DATE OF PROCEDURE:  04/01/2006  DATE OF DISCHARGE:                                 OPERATIVE REPORT   PREOPERATIVE DIAGNOSIS:  Displaced supracondylar fracture right femur.   SURGICAL PROCEDURE:  Retrograde nail fixation supracondylar femur fracture  through total knee replacement.   POSTOPERATIVE DIAGNOSIS:  Displaced supracondylar fracture right femur.   SURGEON:  Dr. Priscille Kluver.   ASSISTANT:  Rexene Edison, [facility]   ANESTHESIA:  General.   PATHOLOGY:  The patient is a 300 pound female in poor health with the aortic  stenosis that required several days of tune up prior to surgical fixation.  The fracture is significantly displaced and yet the bone quality is  relatively good.   PROCEDURE:  Under general anesthesia, the right leg is prepared with  DuraPrep and draped in a sterile field.  Attempt was made for a nonsterile  tourniquet but it would not go high enough to allow proximal screw fixation  in the intramedullary nail.  Consequently no tourniquet was used.  The  previous total knee incision two-thirds of it was reopened.  Dissection was  carried medial parapatellar.  Significant bleeding was encountered from many  of the vessels in the fat.  The patella was just subluxed laterally and the  intercondylar notch was found, a guidewire placed, drilling done.  A  measuring rod was passed through the total knee towards the proximal femur.  C-arm pictures were used and reduction of the fracture occurred over the  aluminum triangle and once it was reduced, the determination was made that a  12 mm x 20 cm DePuy Ace rod would fit nicely.  This was then passed and with  the fracture reduced nicely 2 distal screws were placed from the punctures  in  the lateral cortex and 1 proximal in the sliding hole was placed.  This  had excellent purchase and it was not felt appropriate to use a second screw  that was not dynamic proximally.  At this point, with pictures showing  excellent position and alignment on AP and lateral views, the knee was  tested for stability and was quite stable.  The fascia was then sutured with  #1 Tycron, subcu with 2-0 Vicryl and skin with clips.  Operative time  approximately 1 hour total.  The patient tolerated the procedure well and  the set up time with anesthesia probably took just as long as the surgical  time due to the need for a Swan catheter and so forth.  The patient returned  to recovery in satisfactory condition.      John L. Rendall, M.D.  Electronically Signed    JLR/MEDQ  D:  04/01/2006  T:  04/01/2006  Job:  [id number]

## 2011-02-12 NOTE — Consult Note (Signed)
[name], [name]                ACCOUNT NO.:  [id number]   MEDICAL RECORD NO.:  [id number]          PATIENT TYPE:  INP   LOCATION:  0154                         [facility]ACILITY[facility]   PHYSICIAN:  Francisca December, M.D.  DATE OF BIRTH:  03/07/1933   DATE OF CONSULTATION:  03/28/2006  DATE OF DISCHARGE:                                   CONSULTATION   CARDIAC CONSULTATION:   REASON FOR CONSULTATION:  Preoperative clearance setting of known aortic  valvular disease.   HISTORY OF PRESENT ILLNESS:  [name] is a 77 year old woman with  multiple medical problems including known severe aortic stenosis who fell  on March 26, 2006 by tripping on a rugged area at [employer].  She sustained  a distal femur fracture and needs surgery pending medical clearance.  Recently, she was felt not to be a surgical candidate for left shoulder  (elective) surgery given her age, weight, diabetes and aortic valvular  disease.  Notably she has not had cardiac catheterization to confirm the  aortic valve findings primarily as she would not be at this point a  candidate for aortic valvular replacement.   She currently denies any worsening of her chronic dyspnea and has not had  anginal chest discomfort.  No syncope.   PAST MEDICAL HISTORY:  1.  Insulin-dependent diabetes mellitus.  2.  Osteoarthritis, S/P bilateral TKR.  3.  Aortic stenosis felt to be severe by echocardiogram May 2007 in my      office, EF 60%, moderate LVH.  4.  S/P cholecystectomy.  5.  Chronic anemia.  6.  Hypertension.  7.  Morbid obesity.  8.  Glaucoma.  9.  Hyperlipidemia.  10. Chronic left bundle branch block.  11. History of lacunar infarcts.  12. Dementia.  13. Bilateral cataract surgery.   SOCIAL HISTORY:  She lives with her daughters.  She is married, worked  previously in day care.  No tobacco or ethanol use.   FAMILY HISTORY:  Father died of coronary disease but later in life.  Mother  died of lung disease.   ALLERGIES:  CELEBREX UNKNOWN REACTION.   CURRENT MEDICATIONS:  1.  Aspirin 81 mg p.o. daily.  2.  Wellbutrin 300 mg p.o. daily.  3.  Lovenox 70 mg subcu daily.  4.  Iron 325 t.i.d.  5.  Neurontin 300 mg q.a.m. and 600 mg q.p.m.  6.  Hydrochlorothiazide 12.5 mg p.o. q.d.  7.  NovoLog/Lantus insulin.  8.  Prinivil 20 mg p.o. b.i.d.  9.  Protonix 40 mg p.o. q.d.  10. Paxil 40 mg p.o. daily.  11. Avandia 4 mg p.o. b.i.d.  12. Zocor 40 mg p.o. daily.   REVIEW OF SYSTEMS:  Otherwise negative.   PHYSICAL EXAMINATION:  VITAL SIGNS:  Blood pressure 110/40, pulse 80,  respiratory rate 18, temperature afebrile.  O2 saturation 100% on 3 liters.  Currently input/output is +1075 mL of intravenous fluids at 100 mL/hour.  GENERAL:  This is an obese, pleasant, oriented 77 year old woman in no  distress.  HEENT:  Unremarkable.  NECK:  The neck is short and thick.  Difficult to palpate the carotid  upstroke.  There is no bruit.  CHEST:  Her chest is clear anterior and posteriorly with adequate air  movement bilaterally.  HEART:  The heart has regular rate and rhythm.  There is a high-pitched  ejection systolic murmur mid-peaking along the left sternal border.  No  diastolic murmur.  ABDOMEN:  The abdomen is soft, obese and nontender, positive bowel sounds  throughout.  EXTERNAL GENITALIA:  Foley catheter in place.  EXTREMITIES:  The lower extremities have no edema.  Distal pulses are  diminished.  The right leg is splinted.  NEUROLOGIC:  Exam is nonfocal.  SKIN:  Warm and dry.   ACCESSORY CLINICAL DATA:  Her BUN is 24, creatinine is 1.9, hemoglobin 9.4,  hematocrit 27.9, white blood cell count of 7200.  TSH 2.3.  Serum  electrolytes normal.  Chest x-ray:  Cardiomegaly, pulmonary vascular  congestion.  EKG sinus rhythm left bundle branch block.   Echocardiogram today performed showed an aortic valve area by the Doppler  velocity of 1.26 sq cm, the transaortic velocity was 3.7 meters per  second.  The LVOT diameter was 2 cm.  There was moderate thickening of the leaflets  but some leaflet movement was seen.   IMPRESSION:  1.  Moderate aortic stenosis by most recent echocardiogram study.  This      seemed to be a high quality study and would coincide with the physical      findings.  2.  Distal femur fracture.  3.  Status post bilateral total knee replacement.  4.  Morbid obesity.  5.  Hypertension.  6.  Diabetes mellitus, insulin-requiring.  7.  Hyperlipidemia.  8.  Left bundle branch block.  9.  Renal insufficiency on high dose ACE inhibitor.  10. Chronic iron deficiency anemia.  11. Glaucoma.   PLAN/RECOMMENDATION:  At this point I think the patient is an adequate  candidate for general anesthesia and operative repair of the distal femur  fracture.  Certainly this is not an elective procedure.  Her risk is low to  moderate in nature with careful anesthetic technique.  I would consider  placement of a pulmonary catheter prior to surgery and  to maintain this for 24 hours following surgery.  Also would require 24  hours of ICU monitoring following surgery as well.  Finally, would consider  decreasing her ACE inhibitor to 10-20 mg daily given the elevated creatinine  which is relatively new.      Francisca December, M.D.  Electronically Signed     JHE/MEDQ  D:  03/28/2006  T:  03/28/2006  Job:  [id number]   cc:   Talmadge Coventry, M.D.  Fax: [phone]   John L. Rendall, M.D.  Fax: [phone]

## 2011-02-12 NOTE — Discharge Summary (Signed)
[name], [name]                ACCOUNT NO.:  [id number]   MEDICAL RECORD NO.:  [id number]          PATIENT TYPE:  INP   LOCATION:  0154                         FACILITY:  [facility]   PHYSICIAN:  Lonia Blood, M.D.      DATE OF BIRTH:  1932/10/03   DATE OF ADMISSION:  03/26/2006  DATE OF DISCHARGE:  03/30/2006                                 DISCHARGE SUMMARY   ADDENDUM TO PREVIOUS DISCHARGE SUMMARY:  Please refer to discharge summary  by Dr. Lonia Blood, job # [id number].   [name] hospital course over the past week has been that of  predominantly physical therapy, occupational therapy, rehabilitation.  She  was denied acceptance into a rehabilitation unit and these subsequent days  have been predominantly that of her daughter's appeal of this denial.  The  patient and daughter declined a SNF.  In any even, they have approached the  end of their appeals process and now currently are planning on discharge to  home with Home Health.  They have requested a mechanical hospital bed and a  large wheelchair and she is currently medically stable for discharge at this  time.  Follow up with Dr. Sonia Baller within 1 week of discharge in addition to  followup with Rendall in 2-3 weeks following discharge.   DISCHARGE INSTRUCTIONS:  She was given medication instructions on discharge  as well as follows:  She was instructed to avoid non-steroidal anti-inflammatory drugs while  taking lisinopril.  She was given instructions to continue aspirin 81 mg  daily, iron sulfate 325 mg 3 times daily, Neurontin 300 mg twice daily,  Lovastatin 40 mg daily, Lantus as she was on prior to admission, dose in the  hospital was 50 units subcu at bedtime.  She is instructed to adjust this  based on her morning CBG and to increase it each night until her morning  blood glucose is less than 100, Avandamet which she is to resume as she was  on prior to admission at 12/998 twice a day, Lasix 40 mg daily.  She is  instructed to follow daily weighs and routine electrolytes.  Monitoring  through her primary care physician and Wellbutrin XL daily as before, 300  mg, Paxil 40 mg daily as before.   Most recent laboratory data prior to discharge as follows:  She had a sodium  of 139, potassium 35.  This was replaced.  BUN 9, creatinine 0.9, glucose  119, hemoglobin was 9.8.      [name], D.O.   Electronically Signed     ______________________________  Lonia Blood, M.D.   ESS/MEDQ  D:  04/14/2006  T:  04/14/2006  Job:  [id number]   cc:   Lonia Blood, M.D.  Verline Lema  John L. Rendall, M.D.

## 2011-02-12 NOTE — Discharge Summary (Signed)
[name], [name]                ACCOUNT NO.:  [id number]   MEDICAL RECORD NO.:  [id number]          PATIENT TYPE:  INP   LOCATION:  2032                         FACILITY:  [facility]   PHYSICIAN:  Lonia Blood, M.D.      DATE OF BIRTH:  10-19-1932   DATE OF ADMISSION:  03/30/2006  DATE OF DISCHARGE:                                 DISCHARGE SUMMARY   PRIMARY CARE PHYSICIAN:  Dr. Talmadge Coventry.   CURRENT DIAGNOSES:  1.  Right periprosthetic femoral fracture status post surgical repair.  2.  Severe aortic stenosis.  3.  Acute renal failure.  4.  Morbid obesity.  5.  Obstructive sleep apnea.  6.  Type 2 diabetes.  7.  Chronic anemia exacerbated by acute blood loss.  8.  Urinary tract infection.  9.  Transient thrombocytopenia.  10. Transient paroxysmal atrial fibrillation.   DISCHARGE MEDICATIONS:  To be dictated at the time of discharge.   DISPOSITION:  Patient is currently very stable.  She participating with  physical therapy and occupational therapy.  There is a recommendation for  transfer to rehab and she will be evaluated by rehab and possibly go to  rehab before being discharge home.   PROCEDURE PERFORMED THIS ADMISSION:  1.  X-rays of the right knee performed on March 27, 2006 showed distal femoral      fracture just about the prosthesis was 1 shaft with posterior      displacement and minimal medial displacement of the distal fragment.  2.  Chest x-ray on March 28, 2006 showed cardiomegaly and interstitial edema.  3.  A 2D echocardiogram on March 28, 2006 showed an EF of 55-65%.  No left      ventricular regional wall motion abnormalities.  Left ventricular wall      thickness was mildly increased.  Aortic valve thickness moderate to      markedly increased.  Moderate to markedly increased mitral annular      calcification.  4.  Chest x-ray on April 01, 2006 showed new left vascular sheath with the tip      in the left innominate subclavian junction, stable edema  pattern.  5.  X-ray of the right femur on April 01, 2006 showed intramedullary rod      fixation at the previous demonstrated distal right femoral fracture.      Plus fluoroscopy was utilized for the procedure.  6.  Surgical repair of her femoral fracture performed on April 01, 2006 by Dr.      Marciano Sequin, orthopedic surgery, with retrograde nail in the right distal      femoral.   CONSULTATIONS:  1.  Dr. Marciano Sequin, orthopedic surgery.  2.  Dr. Amil Amen, cardiology.   BRIEF HISTORY AND PHYSICAL:  Please refer to dictated history and physical  on admission by Dr. Hillery Aldo.  It showed however, patient was  originally admitted on March 26, 2006 at [facility] secondary to  fall with right hip pain.  She has history of osteoarthritis, morbid obesity  and has previous prosthesis in her right knee.  On arrival  in the ED, she  was having severe pain in the right hip.  Initial evaluation then did not  show any fracture so patient was admitted for pain control.  Subsequently,  she was discovered to have the right distal femoral fracture.   HOSPITAL COURSE:  Problem 1.  Right distal femoral fracture.  Patient's  fracture was fairly prosthetic.  Plan was therefore made for patient to have  surgical reduction.  Due to the patient's loud murmur and later findings of  moderate to severe aortic stenosis, patient was transferred to [facility] with the intent of having a Swan-Ganz catheter inserted to monitor  her hemodynamically through surgery.  Cardiology was consulted.  Patient  however did not recover ultimately and she had the surgical reduction  performed.  She is now undergoing physical therapy, occupational therapy and  has been recommended for rehab.   Problem 2.  Aortic stenosis.  This was 1 of the major comorbidities that the  patient had.  She however for the most part stable except for 1 episode of  paroxysmal atrial fibrillation.  She was monitored with a CVP only  without  Swan-Ganz inserted secondary to having left bundle branch block on her EKG  and the fear that any attempt to put a Swan-Ganz catheter would lead to  right bundle branch block and therefore complete her block.  Patient did  well through surgery and currently she is stable hemodynamically.   Problem 3.  Postop anemia.  Patient's hemoglobin dropped after surgery and  has continued to drop.  It is currently 8.3.  She has been typed and cross  match for blood.  If her hemoglobin drop to 8 or lower, we will go ahead and  transfuse her.   Problem 4.  Acute renal failure.  Her creatinine rose as high as almost 2.  This has however been progressively declining, normalized to 0.9, they  arouse to 1.5, but currently coming down and it is at 1.2.   Problem 5.  Hypertension.  Her blood pressure has been well controlled  without any major problems this admission.   Problem 6.  Obstructive sleep apnea.  Patient has been receiving CPAP in the  hospital.  She apparently takes it sparingly at home and does not want to  continue to take it in here.   Problem 7.  Diabetes type 2.  Patient's sugar are for the most part  controlled, although she has had some ups and down.  Currently, her CBGs are  well controlled and we will continue to monitor closely.   Other medical problems include morbid obesity, etc, assume to be stable and  an addendum will be dictated at the time of discharge.      Lonia Blood, M.D.  Electronically Signed     LG/MEDQ  D:  04/05/2006  T:  04/06/2006  Job:  [id number]

## 2011-02-12 NOTE — H&P (Signed)
Stacy, [name]                ACCOUNT NO.:  [id number]   MEDICAL RECORD NO.:  [id number]          PATIENT TYPE:  INP   LOCATION:  0154                         FACILITY:  [facility]   PHYSICIAN:  Hillery Aldo, M.D.   DATE OF BIRTH:  [birth year]   DATE OF ADMISSION:  03/26/2006  DATE OF DISCHARGE:                                HISTORY & PHYSICAL   PRIMARY CARE PHYSICIAN:  Dr. Talmadge Coventry.   CHIEF COMPLAINT:  Fall with right hip pain.   HISTORY OF PRESENT ILLNESS:  The patient is a 77 year old female, with past  medical history of osteoarthritis and morbid obesity, who tripped and fell  this evening landing on her right hip.  There was no loss of consciousness,  dizziness, or presyncope.  The patient had a fall back in May 2007 and  admits to a fairly unstable gait.  She has a walker for use in the home but  does not routinely use it according to family.  The patient now has  considerable right hip pain and is unable to bear weight or ambulate.  She  reports pain is 8/10.  Initial plain films and CT scans are negative for  fracture; however, she is being admitted for pain control and further  evaluation given her inability to bear weight.   PAST MEDICAL HISTORY:  1.  Hypertension.  2.  Dementia.  3.  Type 2 diabetes.  4.  Osteoporosis.  5.  Aortic stenosis.  6.  Left bundle branch block.  7.  Morbid obesity.  8.  History of anxiety.  9.  Glaucoma.  10. Status post cholecystectomy 1997.  11. Status post left total knee replacement 2000.  12. Status post right total knee replacement 2005.  13. Osteoarthritis.  14. Cerebrovascular disease with lacunar infarcts by CT scan in May 2007.  15. Peripheral neuropathy.  16. History of iron deficiency anemia, status post colonoscopy and upper      endoscopy in September 2006.  Colonoscopy showed internal hemorrhoids      and sigmoid diverticula.  Upper endoscopy showed chronic gastritis with      antral erosions and biopsies  were positive for H.  pylori.  17. Bilateral cataract surgery.   FAMILY HISTORY:  The patient's mother is deceased from lung cancer.  Father  died of heart disease.  She has one brother who died of complications of MS.  She has a sister who has hypertension and diabetes.   SOCIAL HISTORY:  The patient is married.  Currently lives with her daughter.  No tobacco, alcohol or drugs.  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   ALLERGIES:  CELEBREX.   CURRENT MEDICATIONS:  1.  Wellbutrin 300 mg daily.  2.  Temazepam 30 mg q.h.s. p.r.n.  3.  Aranesp every month by Dr. Myna Hidalgo, her hematologist.  4.  Avandamet 12/998 1 tablet b.i.d.  5.  Darvocet-N 100 p.r.n.  6.  Lantus 45 units daily at h.s.  7.  Iron sulfate 325 mg t.i.d.  8.  Cerefolin 1 tablet daily.  9.  Lisinopril/HCTZ 20/12.5  1 tablets b.i.d.  10. Lovastatin 40 mg b.i.d.  11. Neurontin 300 mg q.a.m. and 600 mg q.p.m.  12. Paxil 40 mg daily.   REVIEW OF SYSTEMS:  The patient denies any fever, chills, changes in  appetite, weight changes, shortness of breath, cough, chest pain,  arrhythmia.  There have been no changes in bowel habits.  No hematochezia.  No dysuria, polyuria, polydipsia, headache or focal neurologic deficits.  She does have right hip pain as per HPI and recently has been suffering with  low back pain.   PHYSICAL EXAM:  VITAL SIGNS:  Temperature 98.5, pulse 114, respirations 20,  blood pressure 127/54.  GENERAL:  Morbidly obese female who is in no acute distress.  HEENT:  Normocephalic, atraumatic.  PERRL.  EOMI.  Oropharynx is clear.  Tongue is midline.  Palate rises symmetrically.  NECK:  Supple.  No thyromegaly, no lymphadenopathy, no jugular venous  distension or carotid bruits.  CHEST:  Lungs clear to auscultation bilaterally with good air movement.  HEART:  Tachycardiac rate with a grade 3/6 systolic ejection murmur  consistent with aortic stenosis.  ABDOMEN:  Soft, nontender, nondistended with  normoactive bowel sounds.  GENITOURINARY:  The patient has a Foley catheter in place draining yellow  urine.  EXTREMITIES:  No clubbing, edema, cyanosis.  Pedal pulses 2+ bilaterally.  SKIN:  Warm and dry.  No rashes.  NEUROLOGIC:  The patient is alert and oriented x3.  Cranial nerves II-XII  grossly intact.  She has weakness in the right lower extremity to all muscle  groups tested likely reflecting injury.  Neuro exam is otherwise nonfocal.   DATA REVIEW:  Chest x-ray shows mildly progressive cardiomegaly with  interval mild pulmonary vascular congestion.  There is mildly progressive  interstitial lung disease.  Progressive bronchitic changes.  No fracture or  pneumothorax noted.   Plain films of the right hip show no fracture or dislocation.   CT scan of the hip shows osteopenia but is negative for fracture.   LABORATORY DATA:  CBC shows a white blood cell count 5.7, hemoglobin 11.5,  hematocrit 33.9, platelet count 159.  MCV is 88.9.  Sodium is 139, potassium  4.0, chloride 102, bicarb 25, BUN 18, creatinine 1.1, glucose 133, calcium  9.3.  Urinalysis is negative for nitrites and leukocyte esterase.   ASSESSMENT/PLAN:  1.  Right hip injury rule out occult fracture:  The patient is morbidly      obese and this likely alters the sensitivity of the CT scan and plain      films with findings of occult fracture.  Given her inability to bear      weight, we will admit the patient and obtain a PT and OT evaluation.      Will obtain an MRI of the right hip to further assess the possibility of      hip fracture.  We will use pain medications p.r.n. for pain control.  2.  Tachycardia:  The patient's tachycardia is likely reflective of      underlying pain.  Nevertheless, check a TSH to rule out hyperthyroidism.  3.  Chronic anemia:  The patient has known chronic anemia and has had a full      gastrointestinal workup back in September 2006.  Will continue her iron      supplementation. 4.   Diabetes:  Check the patient's hemoglobin A1c and continue her Lantus      along with sliding scale insulin and her routine oral hypoglycemic  medications.  5.  Prophylaxis:  Will initiate gastrointestinal and deep vein thrombosis      prophylaxis with Protonix and Lovenox.      Hillery Aldo, M.D.  Electronically Signed     CR/MEDQ  D:  03/27/2006  T:  03/28/2006  Job:  [id number]   cc:   Talmadge Coventry, M.D.  Fax: [phone]

## 2011-02-12 NOTE — Op Note (Signed)
NAME:  [name], [name]                          ACCOUNT NO.:  [id number]   MEDICAL RECORD NO.:  [id number]                   PATIENT TYPE:  AMB   LOCATION:  DSC                                  FACILITY:  [facility]   PHYSICIAN:  John L. Rendall III, M.D.           DATE OF BIRTH:  Apr 13, 1933   DATE OF PROCEDURE:  03/06/2004  DATE OF DISCHARGE:                                 OPERATIVE REPORT   PREOPERATIVE DIAGNOSIS:  Two stitch abscesses proximal right thigh over  total knee.   POSTOPERATIVE DIAGNOSIS:  Two stitch abscesses proximal right thigh over  total knee.   OPERATIVE PROCEDURE:  Excision and debridement stitch abscess with closure  over a drain.   SURGEON:  John L. Rendall, M.D.   ANESTHESIA:  General.   PROCEDURE:  Under brief general anesthesia, two stitch abscesses were  elliptically excised opening up a 3 inch portion of the upper 30% of the  incision.  There was a 2 inch subcutaneous layer and buried stitches were  involved with these abscesses down to a level of 1 inch in the fatty tissue.  This tissue was elliptically excised and the wound was debrided, irrigated  with several liters of saline, closed over a 7 mm drain with #1 Vicryl and  skin clips.  The procedure took approximately 30 minutes.                                               John L. Dorothyann Gibbs, M.D.    Renato Gails  D:  03/06/2004  T:  03/06/2004  Job:  [id number]

## 2011-02-12 NOTE — Op Note (Signed)
NAME:  [name], [name]                ACCOUNT NO.:  [id number]   MEDICAL RECORD NO.:  [id number]          PATIENT TYPE:  AMB   LOCATION:  ENDO                         FACILITY:  [facility]   PHYSICIAN:  Anselmo Rod, M.D.  DATE OF BIRTH:  December 13, 1932   DATE OF PROCEDURE:  06/09/2005  DATE OF DISCHARGE:                                 OPERATIVE REPORT   PROCEDURE:  Screening colonoscopy.   ENDOSCOPIST:  Charna Elizabeth, M.D.   INSTRUMENT USED:  Olympus video colonoscope.   INDICATIONS FOR PROCEDURE:  A 77 year old white female with history of iron-  deficiency anemia and occasional rectal bleeding, undergoing a screening  colonoscopy to rule out colonic polyps, masses, etc.   PREPROCEDURE PREPARATION:  Informed consent was procured from the patient.  The patient was fasted for eight hours prior to the procedure and prepped  with a bottle of magnesium citrate and a gallon of GoLYTELY the night prior  to the procedure.  Risks and benefits of the procedure including a 10%  misread of cancer and polyps were discussed with the patient as well.   PREPROCEDURE PHYSICAL:  VITAL SIGNS:  The patient had stable vital signs.  NECK:  Supple.  CHEST:  Clear to auscultation.  CARDIAC:  S1 and S2 regular.  ABDOMEN:  Soft with normal bowel sounds.   DESCRIPTION OF PROCEDURE:  The patient was placed in the left lateral  decubitus position and sedated with Demerol and Versed for the EGD.  No  additional sedation was used for the colonoscopy.  Once the patient was  adequately sedated, maintained on low-flow oxygen and continuous cardiac  monitoring, the Olympus video colonoscope was advanced from the rectum to  the cecum and the appendiceal orifice and ileocecal valve were clearly  visualized.  Poor prep. The patient had some residual stool in the colon.  Multiple washings were done.  Visualization was adequate.  A few early  sigmoid diverticula were appreciated.  Prominent internal hemorrhoids were  seen  on retroflexion.  The rest of the exam was unremarkable.   IMPRESSION:  1.  Prominent bleeding internal hemorrhoids.  2.  Few early sigmoid diverticula.  3.  Normal-appearing transverse colon.  4.  Right colon and cecum.   RECOMMENDATIONS:  1.  Continue a high fiber diet with liberal fluid intake.  2.  Repeat colonoscopy in the next 10 years unless the patient develops any      abnormal symptoms in the interim.  3.  Outpatient followup in the next two weeks for further recommendations.      Anselmo Rod, M.D.  Electronically Signed     JNM/MEDQ  D:  06/09/2005  T:  06/09/2005  Job:  [id number]   cc:   Talmadge Coventry, M.D.  [address]  [place]  [place] [address]  Fax: [phone]

## 2011-02-12 NOTE — H&P (Signed)
NAME:  [name], SHROFF                          ACCOUNT NO.:  [id number]   MEDICAL RECORD NO.:  [id number]                   PATIENT TYPE:  OUT   LOCATION:  XRAY                                 FACILITY:  [facility]   PHYSICIAN:  John L. Rendall, M.D.               DATE OF BIRTH:  Mar 23, 1933   DATE OF ADMISSION:  01/30/2004  DATE OF DISCHARGE:                                HISTORY & PHYSICAL   CHIEF COMPLAINT:  Right knee hurts.   HISTORY OF PRESENT ILLNESS:  The patient is a 77 year old white female with  several years history of progressively worsening right knee pain.  The  patient describes it as a hurting, aching sensation diffusely about the  knee, worsens with any weightbearing activity and bending.  She does not  have night pain.  She occasionally has popping and grinding.  It does not  give out.  The pain does radiate up into the thigh.  She denies any previous  injury or surgeries.  X-rays reveal bone-on-bone end-stage osteoarthritis  medial compartment with significant subluxing medially.   ALLERGIES:  No known drug allergies   CURRENT MEDICATIONS:  1. Lisinopril, HCTZ 20/25 mg p.o. daily.  2. Glipizide 10 mg p.o. daily.  3. Atenolol 12.5 mg p.o. daily.  4. Metformin 1000 mg p.o. b.i.d.  5. Tylenol p.r.n.  6. Paxil 20 mg p.o. daily.  7. Mucinex 600 mg p.o. b.i.d.  8. Norvasc 10 mg p.o. daily.  9. Avelox 400 mg p.o. daily started May 4th for a total of 7 days.   PAST MEDICAL HISTORY:  1. Diabetes mellitus, type 2.  2. Hypertension.  3. Cardiac murmur with aortic stenosis, mild, ejection fraction 62%.  4. Obesity.  5. Anxiety.  6. Acute bronchitis/sinusitis.  7. Recent laser surgery for glaucoma.   PAST SURGICAL HISTORY:  1. Cholecystectomy in 1997.  2. Left total knee replacement in 2000.  3. The patient denies any complications of the above-mentioned surgical     procedures.   SOCIAL HISTORY:  The patient is a 77 year old, white, obese female.  Denies  any  history of smoking or alcohol use.  She is married.  She has three grown  children.  She lives in a [place] house, 3 steps to the main entrance.  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   FAMILY PHYSICIAN:  Dr. Talmadge Coventry.   CARDIOLOGY:  [facility], Dr. Amil Amen.   FAMILY MEDICAL HISTORY:  Mother is deceased from lung cancer.  Father is  deceased from heart disease.  One brother deceased from complications of  multiple sclerosis.  Two sisters alive, one with hypertension and diabetes.   REVIEW OF SYSTEMS:  Positive for upper respiratory and sinus infection,  dealing with this all winter long.  She does have a recent cold and earache,  been evaluated by Dr. Smith Mince, placed on Avelox and Mucinex with  improvement over the last  2 days.  She recently had surgery for glaucoma, no  complications.  She does have a cardiac murmur evaluated by [facility].  No cardiac-related complaints of angina.   PHYSICAL EXAMINATION:  VITAL SIGNS:  Height 5 feet 4 inches.  Weight is 282  pounds.  Blood pressure 180/70 rechecked 5 minutes later, 160/80, heart rate  100 and regular, respirations 14 nonlabored.  The patient is afebrile.  GENERAL:  This is an older than stated age-appearing, heavy-set, obese white  female, walks with a significant limp.  She does have difficulty getting in  and out of the chair and on and off the exam table.  She does appear to be  slightly short of breath with transition from the sitting to the exam table  but denies any SOB.  HEENT:  Head is normocephalic.  Pupils equal, round, and reactive  accommodating to light.  External ear without deformities.  Gross hearing is  intact.  Right canal is patent.  She does have a nonerythemic, pearly-gray  TM with a fluid line, nonbulging.  Left canal is slightly cerumen-impacted  without any sign of erythema.  TM is pearly gray, unable to visualize a  fluid level.  Nasal septum is midline.  Oral buccal mucosa is  pink and moist  without lesions.  No significant posterior oropharynx erythema or pustules  noted.  NECK:  Supple, no palpable lymphadenopathy.  Thyroid region is nontender.  She has good range of motion of her cervical spine.  CHEST:  Lung sounds are symmetrical, right to left.  She has some diffuse  lower basilar end-expiratory wheezes bilaterally.  No rhonchi or rales,  nonproductive cough.  HEART:  A 3/6 systolic harsh murmur is heard in the left precordium.  Otherwise, it is regular rate.  ABDOMEN:  Round, obese.  She is nontender with palpation throughout.  Normoactive bowel sounds.  CVA region is nontender.  EXTREMITIES:  Upper extremities are symmetric size and shape.  She has full  range of motion of her shoulders, elbows, and wrists.  Motor strength is  5/5.  LOWER EXTREMITIES:  Extremely obese.  Dr. Priscille Kluver measured a 28.5 inch mid  thigh.  Range of motion of bilateral hips is full extension, flexion to 180  with about 15 degrees internal-external rotation without any discomfort.  Right knee is obese, unable to palpate any direct bony structures due to the  soft tissue.  She has full extension, flexion to 90 degrees limited by soft  tissue.  No significant medial or lateral laxity, but she is tender with  palpation throughout the joint.  Left knee has a well-healed midline  surgical incision.  She is very tender with palpation around the joint.  She  has full extension, flexion to 80 degrees, no instability.  Calves are  nontender.  Ankles are symmetrical with good dorsi and plantar flexion.  PERIPHERAL VASCULAR:  Carotid pulses are 2+, no bruits.  Radial pulses 2+.  Dorsalis pedis pulses are 1+.  She has dry skin, very rough, but no  pigmentation changes.  With her legs dependent for a few minutes, they  become cool and cyanotic-appearing.  It resolves once she elevates them. NEUROLOGIC:  The patient is conscious, alert, and appropriate, able to  easily converse with  examiner.  Cranial nerves are 2-12 grossly intact.  No  gross neurologic defects noted.  BREASTS/RECTAL/GU:  Deferred at this time.   IMPRESSION:  1. End-stage osteoarthritis right knee.  2. Recent bronchitis/sinusitis on Avelox.  3. Diabetes mellitus, type 2.  4. Hypertension.  5. Aortic stenosis with left bundle-branch block, normal stress test,     ejection fraction at 62%.  6. Anxiety.  7. Obesity.   PLAN:  The patient, after being evaluated and finding out the extent of  current medical issues, discussed the patient with Dr. Priscille Kluver.  Both  patient and Dr. Priscille Kluver would like to go ahead with the current surgery  upcoming on May 9 at [facility] if the patient's condition  continues to improve.  She was advised that if her respiratory issues did  not continue to improve or worsen prior to the surgical date, she was to  advise Dr. Smith Mince and Dr. Priscille Kluver.  The patient has been evaluated from  the cardiac standpoint and appears to be a stable risk.  The patient will  undergo all of the routine labs and tests prior to having her right total  knee arthroplasty by Dr. Priscille Kluver.  The patient's preadmission chest x-ray  did find an opacity in her right mid lung.  This was reevaluated by a CT  which the results are not back yet but will be reviewed prior to surgery.      Jamelle Rushing, P.A.                      John L. Priscille Kluver, M.D.    RWK/MEDQ  D:  01/30/2004  T:  01/30/2004  Job:  [id number]

## 2011-04-01 ENCOUNTER — Encounter (HOSPITAL_BASED_OUTPATIENT_CLINIC_OR_DEPARTMENT_OTHER): Payer: Medicare Other | Admitting: Hematology & Oncology

## 2011-04-01 ENCOUNTER — Other Ambulatory Visit: Payer: Self-pay | Admitting: Hematology & Oncology

## 2011-04-01 DIAGNOSIS — N289 Disorder of kidney and ureter, unspecified: Secondary | ICD-10-CM

## 2011-04-01 DIAGNOSIS — D649 Anemia, unspecified: Secondary | ICD-10-CM

## 2011-04-01 LAB — FERRITIN: Ferritin: 392 ng/mL — ABNORMAL HIGH (ref 10–291)

## 2011-04-01 LAB — CBC WITH DIFFERENTIAL (CANCER CENTER ONLY)
BASO#: 0.1 10*3/uL (ref 0.0–0.2)
EOS%: 2.4 % (ref 0.0–7.0)
HCT: 33.6 % — ABNORMAL LOW (ref 34.8–46.6)
HGB: 11.1 g/dL — ABNORMAL LOW (ref 11.6–15.9)
LYMPH#: 1.2 10*3/uL (ref 0.9–3.3)
LYMPH%: 21.6 % (ref 14.0–48.0)
MCHC: 33 g/dL (ref 32.0–36.0)
MCV: 89 fL (ref 81–101)
MONO#: 0.7 10*3/uL (ref 0.1–0.9)
NEUT%: 63.3 % (ref 39.6–80.0)

## 2011-04-01 LAB — CHCC SATELLITE - SMEAR

## 2011-04-01 LAB — IRON AND TIBC: TIBC: 320 ug/dL (ref 250–470)

## 2011-04-01 LAB — RETICULOCYTES (CHCC): ABS Retic: 88.3 10*3/uL (ref 19.0–186.0)

## 2011-04-05 ENCOUNTER — Emergency Department (HOSPITAL_COMMUNITY)
Admission: EM | Admit: 2011-04-05 | Discharge: 2011-04-06 | Disposition: A | Discharge status: Home / Self Care | Payer: Medicare Other | Attending: Orthopaedic Surgery | Admitting: Orthopaedic Surgery

## 2011-04-05 DIAGNOSIS — Z79899 Other long term (current) drug therapy: Secondary | ICD-10-CM | POA: Insufficient documentation

## 2011-04-05 DIAGNOSIS — Z7901 Long term (current) use of anticoagulants: Secondary | ICD-10-CM | POA: Insufficient documentation

## 2011-04-05 DIAGNOSIS — M25539 Pain in unspecified wrist: Secondary | ICD-10-CM | POA: Insufficient documentation

## 2011-04-05 DIAGNOSIS — S6390XA Sprain of unspecified part of unspecified wrist and hand, initial encounter: Secondary | ICD-10-CM | POA: Insufficient documentation

## 2011-04-05 DIAGNOSIS — R220 Localized swelling, mass and lump, head: Secondary | ICD-10-CM | POA: Insufficient documentation

## 2011-04-05 DIAGNOSIS — S63509A Unspecified sprain of unspecified wrist, initial encounter: Secondary | ICD-10-CM | POA: Insufficient documentation

## 2011-04-05 DIAGNOSIS — E119 Type 2 diabetes mellitus without complications: Secondary | ICD-10-CM | POA: Insufficient documentation

## 2011-04-05 DIAGNOSIS — I10 Essential (primary) hypertension: Secondary | ICD-10-CM | POA: Insufficient documentation

## 2011-04-05 DIAGNOSIS — M810 Age-related osteoporosis without current pathological fracture: Secondary | ICD-10-CM | POA: Insufficient documentation

## 2011-04-05 DIAGNOSIS — S0003XA Contusion of scalp, initial encounter: Secondary | ICD-10-CM | POA: Insufficient documentation

## 2011-04-05 DIAGNOSIS — M79609 Pain in unspecified limb: Secondary | ICD-10-CM | POA: Insufficient documentation

## 2011-04-05 DIAGNOSIS — W010XXA Fall on same level from slipping, tripping and stumbling without subsequent striking against object, initial encounter: Secondary | ICD-10-CM | POA: Insufficient documentation

## 2011-04-05 DIAGNOSIS — F068 Other specified mental disorders due to known physiological condition: Secondary | ICD-10-CM | POA: Insufficient documentation

## 2011-04-05 DIAGNOSIS — Y92009 Unspecified place in unspecified non-institutional (private) residence as the place of occurrence of the external cause: Secondary | ICD-10-CM | POA: Insufficient documentation

## 2011-04-05 DIAGNOSIS — S1093XA Contusion of unspecified part of neck, initial encounter: Secondary | ICD-10-CM | POA: Insufficient documentation

## 2011-04-05 DIAGNOSIS — Z7982 Long term (current) use of aspirin: Secondary | ICD-10-CM | POA: Insufficient documentation

## 2011-04-06 ENCOUNTER — Emergency Department (HOSPITAL_COMMUNITY): Payer: Medicare Other

## 2011-04-06 LAB — PROTIME-INR: Prothrombin Time: 24.2 seconds — ABNORMAL HIGH (ref 11.6–15.2)

## 2011-04-06 LAB — CBC
Hemoglobin: 11.3 g/dL — ABNORMAL LOW (ref 12.0–15.0)
MCH: 28.7 pg (ref 26.0–34.0)
MCHC: 32.6 g/dL (ref 30.0–36.0)

## 2011-04-06 LAB — BASIC METABOLIC PANEL
Chloride: 101 mEq/L (ref 96–112)
Creatinine, Ser: 1.29 mg/dL — ABNORMAL HIGH (ref 0.50–1.10)
GFR calc Af Amer: 49 mL/min — ABNORMAL LOW (ref >60)
Potassium: 3.5 mEq/L (ref 3.5–5.1)

## 2011-04-20 ENCOUNTER — Encounter (HOSPITAL_BASED_OUTPATIENT_CLINIC_OR_DEPARTMENT_OTHER): Payer: Medicare Other | Admitting: Oncology

## 2011-04-20 DIAGNOSIS — D649 Anemia, unspecified: Secondary | ICD-10-CM

## 2011-04-20 DIAGNOSIS — N289 Disorder of kidney and ureter, unspecified: Secondary | ICD-10-CM

## 2011-04-25 ENCOUNTER — Ambulatory Visit (INDEPENDENT_AMBULATORY_CARE_PROVIDER_SITE_OTHER): Payer: Medicare Other

## 2011-04-25 ENCOUNTER — Inpatient Hospital Stay (INDEPENDENT_AMBULATORY_CARE_PROVIDER_SITE_OTHER)
Admission: RE | Admit: 2011-04-25 | Discharge: 2011-04-25 | Disposition: A | Discharge status: Home / Self Care | Payer: Medicare Other | Source: Ambulatory Visit | Attending: Family Medicine | Admitting: Family Medicine

## 2011-04-25 DIAGNOSIS — L03211 Cellulitis of face: Secondary | ICD-10-CM

## 2011-04-25 DIAGNOSIS — S8010XA Contusion of unspecified lower leg, initial encounter: Secondary | ICD-10-CM

## 2011-05-02 ENCOUNTER — Emergency Department (HOSPITAL_COMMUNITY)
Admission: EM | Admit: 2011-05-02 | Discharge: 2011-05-02 | Disposition: A | Discharge status: Home / Self Care | Payer: Medicare Other | Attending: Emergency Medicine | Admitting: Emergency Medicine

## 2011-05-02 ENCOUNTER — Emergency Department (HOSPITAL_COMMUNITY): Payer: Medicare Other

## 2011-05-02 DIAGNOSIS — R209 Unspecified disturbances of skin sensation: Secondary | ICD-10-CM | POA: Insufficient documentation

## 2011-05-02 DIAGNOSIS — D649 Anemia, unspecified: Secondary | ICD-10-CM | POA: Insufficient documentation

## 2011-05-02 DIAGNOSIS — Z951 Presence of aortocoronary bypass graft: Secondary | ICD-10-CM | POA: Insufficient documentation

## 2011-05-02 DIAGNOSIS — I10 Essential (primary) hypertension: Secondary | ICD-10-CM | POA: Insufficient documentation

## 2011-05-02 DIAGNOSIS — W19XXXA Unspecified fall, initial encounter: Secondary | ICD-10-CM | POA: Insufficient documentation

## 2011-05-02 DIAGNOSIS — Z79899 Other long term (current) drug therapy: Secondary | ICD-10-CM | POA: Insufficient documentation

## 2011-05-02 DIAGNOSIS — F0390 Unspecified dementia without behavioral disturbance: Secondary | ICD-10-CM | POA: Insufficient documentation

## 2011-05-02 DIAGNOSIS — M79609 Pain in unspecified limb: Secondary | ICD-10-CM | POA: Insufficient documentation

## 2011-05-02 DIAGNOSIS — S8010XA Contusion of unspecified lower leg, initial encounter: Secondary | ICD-10-CM | POA: Insufficient documentation

## 2011-05-02 DIAGNOSIS — E119 Type 2 diabetes mellitus without complications: Secondary | ICD-10-CM | POA: Insufficient documentation

## 2011-05-02 DIAGNOSIS — M810 Age-related osteoporosis without current pathological fracture: Secondary | ICD-10-CM | POA: Insufficient documentation

## 2011-05-02 LAB — BASIC METABOLIC PANEL
BUN: 24 mg/dL — ABNORMAL HIGH (ref 6–23)
Calcium: 9.1 mg/dL (ref 8.4–10.5)
Creatinine, Ser: 1.05 mg/dL (ref 0.50–1.10)
GFR calc Af Amer: >60 mL/min (ref >60)
GFR calc non Af Amer: 51 mL/min — ABNORMAL LOW (ref >60)
Potassium: 3.4 mEq/L — ABNORMAL LOW (ref 3.5–5.1)

## 2011-05-02 LAB — TYPE AND SCREEN
ABO/RH(D): O NEG
Antibody Screen: NEGATIVE

## 2011-05-02 LAB — PROTIME-INR
INR: 2.79 — ABNORMAL HIGH (ref 0.00–1.49)
Prothrombin Time: 29.9 seconds — ABNORMAL HIGH (ref 11.6–15.2)

## 2011-05-02 LAB — DIFFERENTIAL
Eosinophils Absolute: 0.2 10*3/uL (ref 0.0–0.7)
Lymphs Abs: 1.5 10*3/uL (ref 0.7–4.0)
Neutrophils Relative %: 61 % (ref 43–77)

## 2011-05-02 LAB — CBC
MCV: 90.4 fL (ref 78.0–100.0)
Platelets: 201 10*3/uL (ref 150–400)
RBC: 3.11 MIL/uL — ABNORMAL LOW (ref 3.87–5.11)
WBC: 6.6 10*3/uL (ref 4.0–10.5)

## 2011-05-03 ENCOUNTER — Other Ambulatory Visit: Payer: Self-pay | Admitting: Hematology & Oncology

## 2011-05-03 ENCOUNTER — Encounter (HOSPITAL_BASED_OUTPATIENT_CLINIC_OR_DEPARTMENT_OTHER): Payer: Medicare Other | Admitting: Oncology

## 2011-05-03 DIAGNOSIS — D638 Anemia in other chronic diseases classified elsewhere: Secondary | ICD-10-CM

## 2011-05-03 DIAGNOSIS — N289 Disorder of kidney and ureter, unspecified: Secondary | ICD-10-CM

## 2011-05-03 DIAGNOSIS — D649 Anemia, unspecified: Secondary | ICD-10-CM

## 2011-05-03 LAB — CBC WITH DIFFERENTIAL/PLATELET
EOS%: 3.1 % (ref 0.0–7.0)
LYMPH%: 18.8 % (ref 14.0–49.7)
MCH: 29.9 pg (ref 25.1–34.0)
MCV: 89.1 fL (ref 79.5–101.0)
MONO%: 11.6 % (ref 0.0–14.0)
RBC: 3.12 10*6/uL — ABNORMAL LOW (ref 3.70–5.45)
RDW: 17.6 % — ABNORMAL HIGH (ref 11.2–14.5)

## 2011-05-06 NOTE — Consult Note (Signed)
[name], [name] NO.:  [id number]  MEDICAL RECORD NO.:  [id number]  LOCATION:  [facility]                         FACILITY:  [facility]  PHYSICIAN:  Toni Arthurs, MD        DATE OF BIRTH:  [birth year]  DATE OF CONSULTATION:  05/02/2011 DATE OF DISCHARGE:  05/02/2011                                CONSULTATION   REASON FOR CONSULTATION:  Right leg swelling, fear of compartment syndrome.  HISTORY OF PRESENT ILLNESS:  The patient is a 77 year old woman with past medical history significant for congestive heart failure, diabetes, and chronic Coumadin use who fell 3 different times in the last couple of weeks.  She woke up this morning and was complaining of some numbness subjectively in the right leg.  Her daughter brought her to the emergency room for fear of blood clot or some other complication.  The patient complains of some aching pain in the leg on the right.  She says that it felt "numb" on the lateral aspect of the right leg.  She denies any recent numbness, tingling, or weakness.  Otherwise she denies fever, chills, nausea, vomiting, or changes in her appetite.  Her mild pain is better when she elevates her foot, worse when she tries to walk  PAST MEDICAL HISTORY:  Congestive heart failure, type 2 diabetes complicated by peripheral neuropathy, hypertension.  PAST SURGICAL HISTORY:  Bilateral total knee replacements, heart valve replacement, IM nailing of right femur fracture.  FAMILY HISTORY:  Positive for diabetes and hypertension in her siblings, cancer in her mother, and diabetes in her father.  SOCIAL HISTORY:  The patient does not use any tobacco products.  She lives alone.  She does not drink any alcohol.  PHYSICAL EXAMINATION:  The patient is an obese woman in no apparent stress.  She is alert and oriented x4.  Mood and affect normal. Extraocular motions are intact.  Respirations are unlabored.  Her right leg has considerable swelling and  ecchymosis distally.  This is worse at the anterolateral aspect of the leg and she has superficial thinning of the skin in this area.  Her quad and hamstring has 5/5 strength. Tibialis anterior and gastrocsoleus also have 5/5 strength.  She has 5/5 strength in plantarflexion and dorsiflexion of her toes.  She has palpable 2+ dorsalis pedis and posterior tibial pulses.  She feels light touch normally at the superficial and deep peroneal nerves as well as the sural and saphenous nerve and medial and lateral plantar nerves. She has no lymphadenopathy.  The skin has well-healed surgical scars about the knees.  X-RAYS:  Plain films AP and lateral of the knee and the tibia show no obvious fracture.  She does have a knee replacement in place as well as a retrograde femoral nail.  ASSESSMENT:  Right leg hematoma after fall.  PLAN:  Based on my exam, the patient does not have any signs of compartment syndrome.  I think her signs and symptoms are consistent with a large hematoma in the leg due to an elevated INR and a recent fall.  Thankfully she has no sign of fracture.  I think at this point a compression wrap  and soft tissue care are the appropriate course of action.  She can follow up with her primary care provider or myself in 2- 3 weeks.     Toni Arthurs, MD     JH/MEDQ  D:  05/02/2011  T:  05/03/2011  Job:  [id number]  Electronically Signed by Toni Arthurs  on 05/06/2011 12:41:38 PM

## 2011-06-16 ENCOUNTER — Inpatient Hospital Stay (INDEPENDENT_AMBULATORY_CARE_PROVIDER_SITE_OTHER)
Admission: RE | Admit: 2011-06-16 | Discharge: 2011-06-16 | Disposition: A | Discharge status: Home / Self Care | Payer: Medicare Other | Source: Ambulatory Visit | Attending: Family Medicine | Admitting: Family Medicine

## 2011-06-16 DIAGNOSIS — J019 Acute sinusitis, unspecified: Secondary | ICD-10-CM

## 2011-06-29 LAB — CARDIAC PANEL(CRET KIN+CKTOT+MB+TROPI)
Total CK: 144 U/L (ref 7–177)
Total CK: 148 U/L (ref 7–177)
Troponin I: 0.03 ng/mL (ref 0.00–0.06)
Troponin I: 0.03 ng/mL (ref 0.00–0.06)

## 2011-06-29 LAB — PROTIME-INR
INR: 1.3 (ref 0.00–1.49)
Prothrombin Time: 16.4 seconds — ABNORMAL HIGH (ref 11.6–15.2)

## 2011-06-29 LAB — COMPREHENSIVE METABOLIC PANEL
AST: 33 U/L (ref 0–37)
BUN: 16 mg/dL (ref 6–23)
CO2: 24 mEq/L (ref 19–32)
Calcium: 9.1 mg/dL (ref 8.4–10.5)
Chloride: 108 mEq/L (ref 96–112)
Creatinine, Ser: 1.02 mg/dL (ref 0.4–1.2)
GFR calc Af Amer: >60 mL/min (ref >60)
GFR calc non Af Amer: 53 mL/min — ABNORMAL LOW (ref >60)
Total Bilirubin: 1.1 mg/dL (ref 0.3–1.2)

## 2011-06-29 LAB — B-NATRIURETIC PEPTIDE (CONVERTED LAB): Pro B Natriuretic peptide (BNP): 924 pg/mL — ABNORMAL HIGH (ref 0.0–100.0)

## 2011-06-29 LAB — APTT: aPTT: 31 seconds (ref 24–37)

## 2011-06-29 LAB — CBC
Hemoglobin: 11.4 g/dL — ABNORMAL LOW (ref 12.0–15.0)
MCHC: 33.2 g/dL (ref 30.0–36.0)
Platelets: 109 10*3/uL — ABNORMAL LOW (ref 150–400)
RDW: 15.9 % — ABNORMAL HIGH (ref 11.5–15.5)

## 2011-06-29 LAB — DIFFERENTIAL
Basophils Absolute: 0 10*3/uL (ref 0.0–0.1)
Basophils Relative: 1 % (ref 0–1)
Lymphocytes Relative: 22 % (ref 12–46)
Neutro Abs: 3.2 10*3/uL (ref 1.7–7.7)
Neutrophils Relative %: 63 % (ref 43–77)

## 2011-06-29 LAB — GLUCOSE, CAPILLARY
Glucose-Capillary: 115 mg/dL — ABNORMAL HIGH (ref 70–99)
Glucose-Capillary: 134 mg/dL — ABNORMAL HIGH (ref 70–99)

## 2011-07-02 LAB — CBC
HCT: 25.5 % — ABNORMAL LOW (ref 36.0–46.0)
HCT: 26.2 % — ABNORMAL LOW (ref 36.0–46.0)
HCT: 26.7 % — ABNORMAL LOW (ref 36.0–46.0)
HCT: 27.1 % — ABNORMAL LOW (ref 36.0–46.0)
HCT: 27.4 % — ABNORMAL LOW (ref 36.0–46.0)
HCT: 27.8 % — ABNORMAL LOW (ref 36.0–46.0)
HCT: 28.5 % — ABNORMAL LOW (ref 36.0–46.0)
HCT: 28.6 % — ABNORMAL LOW (ref 36.0–46.0)
HCT: 30.9 % — ABNORMAL LOW (ref 36.0–46.0)
HCT: 32.2 % — ABNORMAL LOW (ref 36.0–46.0)
HCT: 32.5 % — ABNORMAL LOW (ref 36.0–46.0)
Hemoglobin: 10.1 g/dL — ABNORMAL LOW (ref 12.0–15.0)
Hemoglobin: 10.6 g/dL — ABNORMAL LOW (ref 12.0–15.0)
Hemoglobin: 8.5 g/dL — ABNORMAL LOW (ref 12.0–15.0)
Hemoglobin: 8.7 g/dL — ABNORMAL LOW (ref 12.0–15.0)
Hemoglobin: 9.1 g/dL — ABNORMAL LOW (ref 12.0–15.0)
Hemoglobin: 9.1 g/dL — ABNORMAL LOW (ref 12.0–15.0)
Hemoglobin: 9.2 g/dL — ABNORMAL LOW (ref 12.0–15.0)
Hemoglobin: 9.3 g/dL — ABNORMAL LOW (ref 12.0–15.0)
Hemoglobin: 9.4 g/dL — ABNORMAL LOW (ref 12.0–15.0)
Hemoglobin: 9.4 g/dL — ABNORMAL LOW (ref 12.0–15.0)
Hemoglobin: 9.4 g/dL — ABNORMAL LOW (ref 12.0–15.0)
Hemoglobin: 9.9 g/dL — ABNORMAL LOW (ref 12.0–15.0)
MCHC: 31.9 g/dL (ref 30.0–36.0)
MCHC: 32.4 g/dL (ref 30.0–36.0)
MCHC: 32.8 g/dL (ref 30.0–36.0)
MCHC: 33 g/dL (ref 30.0–36.0)
MCHC: 33.2 g/dL (ref 30.0–36.0)
MCHC: 33.2 g/dL (ref 30.0–36.0)
MCHC: 33.4 g/dL (ref 30.0–36.0)
MCHC: 33.6 g/dL (ref 30.0–36.0)
MCHC: 33.6 g/dL (ref 30.0–36.0)
MCHC: 34.2 g/dL (ref 30.0–36.0)
MCV: 89.2 fL (ref 78.0–100.0)
MCV: 89.5 fL (ref 78.0–100.0)
MCV: 89.5 fL (ref 78.0–100.0)
MCV: 90 fL (ref 78.0–100.0)
MCV: 90.1 fL (ref 78.0–100.0)
MCV: 90.2 fL (ref 78.0–100.0)
MCV: 90.2 fL (ref 78.0–100.0)
MCV: 90.9 fL (ref 78.0–100.0)
MCV: 90.9 fL (ref 78.0–100.0)
MCV: 91.5 fL (ref 78.0–100.0)
Platelets: 103 10*3/uL — ABNORMAL LOW (ref 150–400)
Platelets: 105 10*3/uL — ABNORMAL LOW (ref 150–400)
Platelets: 109 10*3/uL — ABNORMAL LOW (ref 150–400)
Platelets: 125 10*3/uL — ABNORMAL LOW (ref 150–400)
Platelets: 133 10*3/uL — ABNORMAL LOW (ref 150–400)
Platelets: 136 10*3/uL — ABNORMAL LOW (ref 150–400)
Platelets: 148 10*3/uL — ABNORMAL LOW (ref 150–400)
Platelets: 151 10*3/uL (ref 150–400)
Platelets: 202 10*3/uL (ref 150–400)
Platelets: 91 10*3/uL — ABNORMAL LOW (ref 150–400)
Platelets: 92 10*3/uL — ABNORMAL LOW (ref 150–400)
RBC: 2.81 MIL/uL — ABNORMAL LOW (ref 3.87–5.11)
RBC: 2.91 MIL/uL — ABNORMAL LOW (ref 3.87–5.11)
RBC: 2.96 MIL/uL — ABNORMAL LOW (ref 3.87–5.11)
RBC: 3.01 MIL/uL — ABNORMAL LOW (ref 3.87–5.11)
RBC: 3.05 MIL/uL — ABNORMAL LOW (ref 3.87–5.11)
RBC: 3.06 MIL/uL — ABNORMAL LOW (ref 3.87–5.11)
RBC: 3.17 MIL/uL — ABNORMAL LOW (ref 3.87–5.11)
RBC: 3.19 MIL/uL — ABNORMAL LOW (ref 3.87–5.11)
RBC: 3.19 MIL/uL — ABNORMAL LOW (ref 3.87–5.11)
RBC: 3.37 MIL/uL — ABNORMAL LOW (ref 3.87–5.11)
RBC: 3.61 MIL/uL — ABNORMAL LOW (ref 3.87–5.11)
RDW: 15.3 % (ref 11.5–15.5)
RDW: 15.3 % (ref 11.5–15.5)
RDW: 15.6 % — ABNORMAL HIGH (ref 11.5–15.5)
RDW: 16 % — ABNORMAL HIGH (ref 11.5–15.5)
RDW: 16.1 % — ABNORMAL HIGH (ref 11.5–15.5)
RDW: 16.1 % — ABNORMAL HIGH (ref 11.5–15.5)
RDW: 16.2 % — ABNORMAL HIGH (ref 11.5–15.5)
RDW: 16.4 % — ABNORMAL HIGH (ref 11.5–15.5)
RDW: 22.1 % — ABNORMAL HIGH (ref 11.5–15.5)
WBC: 10.7 10*3/uL — ABNORMAL HIGH (ref 4.0–10.5)
WBC: 12 10*3/uL — ABNORMAL HIGH (ref 4.0–10.5)
WBC: 12.9 10*3/uL — ABNORMAL HIGH (ref 4.0–10.5)
WBC: 13 10*3/uL — ABNORMAL HIGH (ref 4.0–10.5)
WBC: 13.1 10*3/uL — ABNORMAL HIGH (ref 4.0–10.5)
WBC: 13.3 10*3/uL — ABNORMAL HIGH (ref 4.0–10.5)
WBC: 6.3 10*3/uL (ref 4.0–10.5)
WBC: 6.4 10*3/uL (ref 4.0–10.5)
WBC: 7 10*3/uL (ref 4.0–10.5)
WBC: 8.3 10*3/uL (ref 4.0–10.5)
WBC: 9.3 10*3/uL (ref 4.0–10.5)

## 2011-07-02 LAB — PROTIME-INR
INR: 1.3 (ref 0.00–1.49)
INR: 1.3 (ref 0.00–1.49)
INR: 1.4 (ref 0.00–1.49)
INR: 1.5 (ref 0.00–1.49)
INR: 1.7 — ABNORMAL HIGH (ref 0.00–1.49)
INR: 1.7 — ABNORMAL HIGH (ref 0.00–1.49)
INR: 1.8 — ABNORMAL HIGH (ref 0.00–1.49)
INR: 1.8 — ABNORMAL HIGH (ref 0.00–1.49)
INR: 2.2 — ABNORMAL HIGH (ref 0.00–1.49)
Prothrombin Time: 15.7 seconds — ABNORMAL HIGH (ref 11.6–15.2)
Prothrombin Time: 16.2 seconds — ABNORMAL HIGH (ref 11.6–15.2)
Prothrombin Time: 16.8 seconds — ABNORMAL HIGH (ref 11.6–15.2)
Prothrombin Time: 17.3 seconds — ABNORMAL HIGH (ref 11.6–15.2)
Prothrombin Time: 19 seconds — ABNORMAL HIGH (ref 11.6–15.2)
Prothrombin Time: 20.9 seconds — ABNORMAL HIGH (ref 11.6–15.2)
Prothrombin Time: 21 seconds — ABNORMAL HIGH (ref 11.6–15.2)
Prothrombin Time: 22.1 seconds — ABNORMAL HIGH (ref 11.6–15.2)
Prothrombin Time: 26 seconds — ABNORMAL HIGH (ref 11.6–15.2)
Prothrombin Time: 26.1 seconds — ABNORMAL HIGH (ref 11.6–15.2)
Prothrombin Time: 29.4 seconds — ABNORMAL HIGH (ref 11.6–15.2)
Prothrombin Time: 29.8 seconds — ABNORMAL HIGH (ref 11.6–15.2)

## 2011-07-02 LAB — COMPREHENSIVE METABOLIC PANEL
ALT: 23 U/L (ref 0–35)
ALT: 66 U/L — ABNORMAL HIGH (ref 0–35)
ALT: 67 U/L — ABNORMAL HIGH (ref 0–35)
AST: 28 U/L (ref 0–37)
Albumin: 3.6 g/dL (ref 3.5–5.2)
Albumin: 3.8 g/dL (ref 3.5–5.2)
Alkaline Phosphatase: 114 U/L (ref 39–117)
Alkaline Phosphatase: 77 U/L (ref 39–117)
BUN: 21 mg/dL (ref 6–23)
CO2: 23 mEq/L (ref 19–32)
CO2: 26 mEq/L (ref 19–32)
Calcium: 9 mg/dL (ref 8.4–10.5)
Calcium: 9.1 mg/dL (ref 8.4–10.5)
Calcium: 9.3 mg/dL (ref 8.4–10.5)
Chloride: 100 mEq/L (ref 96–112)
Creatinine, Ser: 1.49 mg/dL — ABNORMAL HIGH (ref 0.4–1.2)
GFR calc Af Amer: 42 mL/min — ABNORMAL LOW (ref >60)
GFR calc non Af Amer: 26 mL/min — ABNORMAL LOW (ref >60)
GFR calc non Af Amer: 34 mL/min — ABNORMAL LOW (ref >60)
Glucose, Bld: 123 mg/dL — ABNORMAL HIGH (ref 70–99)
Glucose, Bld: 126 mg/dL — ABNORMAL HIGH (ref 70–99)
Glucose, Bld: 168 mg/dL — ABNORMAL HIGH (ref 70–99)
Potassium: 4.1 mEq/L (ref 3.5–5.1)
Potassium: 5 mEq/L (ref 3.5–5.1)
Sodium: 135 mEq/L (ref 135–145)
Sodium: 136 mEq/L (ref 135–145)
Sodium: 138 mEq/L (ref 135–145)
Total Bilirubin: 1.4 mg/dL — ABNORMAL HIGH (ref 0.3–1.2)
Total Bilirubin: 1.4 mg/dL — ABNORMAL HIGH (ref 0.3–1.2)
Total Protein: 6.3 g/dL (ref 6.0–8.3)
Total Protein: 6.8 g/dL (ref 6.0–8.3)

## 2011-07-02 LAB — NASAL CULTURE (N/P): Culture: NORMAL

## 2011-07-02 LAB — BASIC METABOLIC PANEL
BUN: 15 mg/dL (ref 6–23)
BUN: 17 mg/dL (ref 6–23)
BUN: 18 mg/dL (ref 6–23)
BUN: 21 mg/dL (ref 6–23)
BUN: 21 mg/dL (ref 6–23)
BUN: 23 mg/dL (ref 6–23)
BUN: 24 mg/dL — ABNORMAL HIGH (ref 6–23)
BUN: 29 mg/dL — ABNORMAL HIGH (ref 6–23)
BUN: 29 mg/dL — ABNORMAL HIGH (ref 6–23)
BUN: 34 mg/dL — ABNORMAL HIGH (ref 6–23)
BUN: 34 mg/dL — ABNORMAL HIGH (ref 6–23)
BUN: 35 mg/dL — ABNORMAL HIGH (ref 6–23)
BUN: 5 mg/dL — ABNORMAL LOW (ref 6–23)
BUN: 8 mg/dL (ref 6–23)
CO2: 23 mEq/L (ref 19–32)
CO2: 24 mEq/L (ref 19–32)
CO2: 25 mEq/L (ref 19–32)
CO2: 25 mEq/L (ref 19–32)
CO2: 25 mEq/L (ref 19–32)
CO2: 26 mEq/L (ref 19–32)
CO2: 27 mEq/L (ref 19–32)
CO2: 31 mEq/L (ref 19–32)
Calcium: 8.1 mg/dL — ABNORMAL LOW (ref 8.4–10.5)
Calcium: 8.3 mg/dL — ABNORMAL LOW (ref 8.4–10.5)
Calcium: 8.3 mg/dL — ABNORMAL LOW (ref 8.4–10.5)
Calcium: 8.3 mg/dL — ABNORMAL LOW (ref 8.4–10.5)
Calcium: 8.4 mg/dL (ref 8.4–10.5)
Calcium: 8.4 mg/dL (ref 8.4–10.5)
Calcium: 8.7 mg/dL (ref 8.4–10.5)
Calcium: 8.7 mg/dL (ref 8.4–10.5)
Calcium: 9 mg/dL (ref 8.4–10.5)
Calcium: 9 mg/dL (ref 8.4–10.5)
Chloride: 100 mEq/L (ref 96–112)
Chloride: 100 mEq/L (ref 96–112)
Chloride: 101 mEq/L (ref 96–112)
Chloride: 101 mEq/L (ref 96–112)
Chloride: 101 mEq/L (ref 96–112)
Chloride: 102 mEq/L (ref 96–112)
Chloride: 104 mEq/L (ref 96–112)
Chloride: 104 mEq/L (ref 96–112)
Chloride: 105 mEq/L (ref 96–112)
Chloride: 106 mEq/L (ref 96–112)
Chloride: 106 mEq/L (ref 96–112)
Chloride: 107 mEq/L (ref 96–112)
Chloride: 110 mEq/L (ref 96–112)
Chloride: 97 mEq/L (ref 96–112)
Creatinine, Ser: 0.78 mg/dL (ref 0.4–1.2)
Creatinine, Ser: 1.21 mg/dL — ABNORMAL HIGH (ref 0.4–1.2)
Creatinine, Ser: 1.32 mg/dL — ABNORMAL HIGH (ref 0.4–1.2)
Creatinine, Ser: 1.34 mg/dL — ABNORMAL HIGH (ref 0.4–1.2)
Creatinine, Ser: 1.34 mg/dL — ABNORMAL HIGH (ref 0.4–1.2)
Creatinine, Ser: 1.42 mg/dL — ABNORMAL HIGH (ref 0.4–1.2)
Creatinine, Ser: 1.55 mg/dL — ABNORMAL HIGH (ref 0.4–1.2)
Creatinine, Ser: 1.57 mg/dL — ABNORMAL HIGH (ref 0.4–1.2)
Creatinine, Ser: 1.57 mg/dL — ABNORMAL HIGH (ref 0.4–1.2)
Creatinine, Ser: 1.62 mg/dL — ABNORMAL HIGH (ref 0.4–1.2)
Creatinine, Ser: 1.69 mg/dL — ABNORMAL HIGH (ref 0.4–1.2)
Creatinine, Ser: 1.73 mg/dL — ABNORMAL HIGH (ref 0.4–1.2)
GFR calc Af Amer: 35 mL/min — ABNORMAL LOW (ref >60)
GFR calc Af Amer: 38 mL/min — ABNORMAL LOW (ref >60)
GFR calc Af Amer: 39 mL/min — ABNORMAL LOW (ref >60)
GFR calc Af Amer: 39 mL/min — ABNORMAL LOW (ref >60)
GFR calc Af Amer: 40 mL/min — ABNORMAL LOW (ref >60)
GFR calc Af Amer: 44 mL/min — ABNORMAL LOW (ref >60)
GFR calc Af Amer: 45 mL/min — ABNORMAL LOW (ref >60)
GFR calc Af Amer: 47 mL/min — ABNORMAL LOW (ref >60)
GFR calc Af Amer: 48 mL/min — ABNORMAL LOW (ref >60)
GFR calc Af Amer: >60 mL/min (ref >60)
GFR calc Af Amer: >60 mL/min (ref >60)
GFR calc Af Amer: >60 mL/min (ref >60)
GFR calc non Af Amer: 29 mL/min — ABNORMAL LOW (ref >60)
GFR calc non Af Amer: 30 mL/min — ABNORMAL LOW (ref >60)
GFR calc non Af Amer: 31 mL/min — ABNORMAL LOW (ref >60)
GFR calc non Af Amer: 32 mL/min — ABNORMAL LOW (ref >60)
GFR calc non Af Amer: 32 mL/min — ABNORMAL LOW (ref >60)
GFR calc non Af Amer: 33 mL/min — ABNORMAL LOW (ref >60)
GFR calc non Af Amer: 36 mL/min — ABNORMAL LOW (ref >60)
GFR calc non Af Amer: 37 mL/min — ABNORMAL LOW (ref >60)
GFR calc non Af Amer: 39 mL/min — ABNORMAL LOW (ref >60)
GFR calc non Af Amer: 39 mL/min — ABNORMAL LOW (ref >60)
GFR calc non Af Amer: 43 mL/min — ABNORMAL LOW (ref >60)
GFR calc non Af Amer: 44 mL/min — ABNORMAL LOW (ref >60)
GFR calc non Af Amer: 45 mL/min — ABNORMAL LOW (ref >60)
GFR calc non Af Amer: 59 mL/min — ABNORMAL LOW (ref >60)
GFR calc non Af Amer: >60 mL/min (ref >60)
Glucose, Bld: 114 mg/dL — ABNORMAL HIGH (ref 70–99)
Glucose, Bld: 118 mg/dL — ABNORMAL HIGH (ref 70–99)
Glucose, Bld: 124 mg/dL — ABNORMAL HIGH (ref 70–99)
Glucose, Bld: 125 mg/dL — ABNORMAL HIGH (ref 70–99)
Glucose, Bld: 132 mg/dL — ABNORMAL HIGH (ref 70–99)
Glucose, Bld: 139 mg/dL — ABNORMAL HIGH (ref 70–99)
Glucose, Bld: 147 mg/dL — ABNORMAL HIGH (ref 70–99)
Glucose, Bld: 167 mg/dL — ABNORMAL HIGH (ref 70–99)
Glucose, Bld: 178 mg/dL — ABNORMAL HIGH (ref 70–99)
Glucose, Bld: 73 mg/dL (ref 70–99)
Glucose, Bld: 93 mg/dL (ref 70–99)
Glucose, Bld: 97 mg/dL (ref 70–99)
Potassium: 2.9 mEq/L — ABNORMAL LOW (ref 3.5–5.1)
Potassium: 3 mEq/L — ABNORMAL LOW (ref 3.5–5.1)
Potassium: 3.1 mEq/L — ABNORMAL LOW (ref 3.5–5.1)
Potassium: 3.4 mEq/L — ABNORMAL LOW (ref 3.5–5.1)
Potassium: 3.4 mEq/L — ABNORMAL LOW (ref 3.5–5.1)
Potassium: 3.7 mEq/L (ref 3.5–5.1)
Potassium: 3.8 mEq/L (ref 3.5–5.1)
Potassium: 4 mEq/L (ref 3.5–5.1)
Potassium: 4.2 mEq/L (ref 3.5–5.1)
Potassium: 4.3 mEq/L (ref 3.5–5.1)
Potassium: 4.4 mEq/L (ref 3.5–5.1)
Potassium: 4.6 mEq/L (ref 3.5–5.1)
Potassium: 4.8 mEq/L (ref 3.5–5.1)
Potassium: 4.8 mEq/L (ref 3.5–5.1)
Sodium: 135 mEq/L (ref 135–145)
Sodium: 135 mEq/L (ref 135–145)
Sodium: 136 mEq/L (ref 135–145)
Sodium: 138 mEq/L (ref 135–145)
Sodium: 138 mEq/L (ref 135–145)
Sodium: 138 mEq/L (ref 135–145)
Sodium: 139 mEq/L (ref 135–145)
Sodium: 139 mEq/L (ref 135–145)
Sodium: 139 mEq/L (ref 135–145)
Sodium: 140 mEq/L (ref 135–145)
Sodium: 141 mEq/L (ref 135–145)
Sodium: 141 mEq/L (ref 135–145)
Sodium: 142 mEq/L (ref 135–145)
Sodium: 142 mEq/L (ref 135–145)

## 2011-07-02 LAB — URINALYSIS, ROUTINE W REFLEX MICROSCOPIC
Glucose, UA: NEGATIVE mg/dL
Hgb urine dipstick: NEGATIVE
Ketones, ur: NEGATIVE mg/dL
Ketones, ur: NEGATIVE mg/dL
Leukocytes, UA: NEGATIVE
Leukocytes, UA: NEGATIVE
Nitrite: NEGATIVE
Nitrite: NEGATIVE
Protein, ur: 30 mg/dL — AB
Protein, ur: NEGATIVE mg/dL
Specific Gravity, Urine: 1.017 (ref 1.005–1.030)
Urobilinogen, UA: 1 mg/dL (ref 0.0–1.0)
Urobilinogen, UA: 1 mg/dL (ref 0.0–1.0)
Urobilinogen, UA: 4 mg/dL — ABNORMAL HIGH (ref 0.0–1.0)
pH: 6.5 (ref 5.0–8.0)

## 2011-07-02 LAB — GLUCOSE, CAPILLARY
Glucose-Capillary: 100 mg/dL — ABNORMAL HIGH (ref 70–99)
Glucose-Capillary: 100 mg/dL — ABNORMAL HIGH (ref 70–99)
Glucose-Capillary: 101 mg/dL — ABNORMAL HIGH (ref 70–99)
Glucose-Capillary: 101 mg/dL — ABNORMAL HIGH (ref 70–99)
Glucose-Capillary: 105 mg/dL — ABNORMAL HIGH (ref 70–99)
Glucose-Capillary: 106 mg/dL — ABNORMAL HIGH (ref 70–99)
Glucose-Capillary: 106 mg/dL — ABNORMAL HIGH (ref 70–99)
Glucose-Capillary: 107 mg/dL — ABNORMAL HIGH (ref 70–99)
Glucose-Capillary: 107 mg/dL — ABNORMAL HIGH (ref 70–99)
Glucose-Capillary: 107 mg/dL — ABNORMAL HIGH (ref 70–99)
Glucose-Capillary: 107 mg/dL — ABNORMAL HIGH (ref 70–99)
Glucose-Capillary: 108 mg/dL — ABNORMAL HIGH (ref 70–99)
Glucose-Capillary: 108 mg/dL — ABNORMAL HIGH (ref 70–99)
Glucose-Capillary: 110 mg/dL — ABNORMAL HIGH (ref 70–99)
Glucose-Capillary: 112 mg/dL — ABNORMAL HIGH (ref 70–99)
Glucose-Capillary: 113 mg/dL — ABNORMAL HIGH (ref 70–99)
Glucose-Capillary: 113 mg/dL — ABNORMAL HIGH (ref 70–99)
Glucose-Capillary: 114 mg/dL — ABNORMAL HIGH (ref 70–99)
Glucose-Capillary: 117 mg/dL — ABNORMAL HIGH (ref 70–99)
Glucose-Capillary: 119 mg/dL — ABNORMAL HIGH (ref 70–99)
Glucose-Capillary: 123 mg/dL — ABNORMAL HIGH (ref 70–99)
Glucose-Capillary: 125 mg/dL — ABNORMAL HIGH (ref 70–99)
Glucose-Capillary: 125 mg/dL — ABNORMAL HIGH (ref 70–99)
Glucose-Capillary: 126 mg/dL — ABNORMAL HIGH (ref 70–99)
Glucose-Capillary: 127 mg/dL — ABNORMAL HIGH (ref 70–99)
Glucose-Capillary: 127 mg/dL — ABNORMAL HIGH (ref 70–99)
Glucose-Capillary: 128 mg/dL — ABNORMAL HIGH (ref 70–99)
Glucose-Capillary: 128 mg/dL — ABNORMAL HIGH (ref 70–99)
Glucose-Capillary: 129 mg/dL — ABNORMAL HIGH (ref 70–99)
Glucose-Capillary: 129 mg/dL — ABNORMAL HIGH (ref 70–99)
Glucose-Capillary: 129 mg/dL — ABNORMAL HIGH (ref 70–99)
Glucose-Capillary: 130 mg/dL — ABNORMAL HIGH (ref 70–99)
Glucose-Capillary: 131 mg/dL — ABNORMAL HIGH (ref 70–99)
Glucose-Capillary: 135 mg/dL — ABNORMAL HIGH (ref 70–99)
Glucose-Capillary: 137 mg/dL — ABNORMAL HIGH (ref 70–99)
Glucose-Capillary: 137 mg/dL — ABNORMAL HIGH (ref 70–99)
Glucose-Capillary: 138 mg/dL — ABNORMAL HIGH (ref 70–99)
Glucose-Capillary: 143 mg/dL — ABNORMAL HIGH (ref 70–99)
Glucose-Capillary: 143 mg/dL — ABNORMAL HIGH (ref 70–99)
Glucose-Capillary: 144 mg/dL — ABNORMAL HIGH (ref 70–99)
Glucose-Capillary: 145 mg/dL — ABNORMAL HIGH (ref 70–99)
Glucose-Capillary: 155 mg/dL — ABNORMAL HIGH (ref 70–99)
Glucose-Capillary: 159 mg/dL — ABNORMAL HIGH (ref 70–99)
Glucose-Capillary: 159 mg/dL — ABNORMAL HIGH (ref 70–99)
Glucose-Capillary: 162 mg/dL — ABNORMAL HIGH (ref 70–99)
Glucose-Capillary: 166 mg/dL — ABNORMAL HIGH (ref 70–99)
Glucose-Capillary: 169 mg/dL — ABNORMAL HIGH (ref 70–99)
Glucose-Capillary: 173 mg/dL — ABNORMAL HIGH (ref 70–99)
Glucose-Capillary: 177 mg/dL — ABNORMAL HIGH (ref 70–99)
Glucose-Capillary: 200 mg/dL — ABNORMAL HIGH (ref 70–99)
Glucose-Capillary: 201 mg/dL — ABNORMAL HIGH (ref 70–99)
Glucose-Capillary: 205 mg/dL — ABNORMAL HIGH (ref 70–99)
Glucose-Capillary: 226 mg/dL — ABNORMAL HIGH (ref 70–99)
Glucose-Capillary: 63 mg/dL — ABNORMAL LOW (ref 70–99)
Glucose-Capillary: 76 mg/dL (ref 70–99)
Glucose-Capillary: 76 mg/dL (ref 70–99)
Glucose-Capillary: 77 mg/dL (ref 70–99)
Glucose-Capillary: 78 mg/dL (ref 70–99)
Glucose-Capillary: 94 mg/dL (ref 70–99)
Glucose-Capillary: 95 mg/dL (ref 70–99)
Glucose-Capillary: 98 mg/dL (ref 70–99)

## 2011-07-02 LAB — BLOOD GAS, ARTERIAL
Acid-Base Excess: 1.9 mmol/L (ref 0.0–2.0)
Bicarbonate: 25.6 mEq/L — ABNORMAL HIGH (ref 20.0–24.0)
O2 Saturation: 92.8 %
Patient temperature: 98.6
TCO2: 26.7 mmol/L (ref 0–100)
pCO2 arterial: 37.2 mmHg (ref 35.0–45.0)
pH, Arterial: 7.452 — ABNORMAL HIGH (ref 7.350–7.400)
pO2, Arterial: 62.2 mmHg — ABNORMAL LOW (ref 80.0–100.0)

## 2011-07-02 LAB — LIPID PANEL
Cholesterol: 111 mg/dL (ref 0–200)
HDL: 42 mg/dL (ref >39)
LDL Cholesterol: 54 mg/dL (ref 0–99)
Total CHOL/HDL Ratio: 2.6 RATIO

## 2011-07-02 LAB — AMMONIA: Ammonia: 31 umol/L (ref 11–35)

## 2011-07-02 LAB — POCT I-STAT 3, ART BLOOD GAS (G3+)
Acid-base deficit: 1 mmol/L (ref 0.0–2.0)
Acid-base deficit: 2 mmol/L (ref 0.0–2.0)
Bicarbonate: 23.7 mEq/L (ref 20.0–24.0)
Bicarbonate: 24.8 mEq/L — ABNORMAL HIGH (ref 20.0–24.0)
O2 Saturation: 100 %
O2 Saturation: 94 %
O2 Saturation: 99 %
TCO2: 25 mmol/L (ref 0–100)
TCO2: 25 mmol/L (ref 0–100)
pCO2 arterial: 39.6 mmHg (ref 35.0–45.0)
pCO2 arterial: 41 mmHg (ref 35.0–45.0)
pCO2 arterial: 41.3 mmHg (ref 35.0–45.0)
pCO2 arterial: 41.5 mmHg (ref 35.0–45.0)
pCO2 arterial: 47.3 mmHg — ABNORMAL HIGH (ref 35.0–45.0)
pCO2 arterial: 48 mmHg — ABNORMAL HIGH (ref 35.0–45.0)
pCO2 arterial: 52.7 mmHg — ABNORMAL HIGH (ref 35.0–45.0)
pH, Arterial: 7.277 — ABNORMAL LOW (ref 7.350–7.400)
pH, Arterial: 7.302 — ABNORMAL LOW (ref 7.350–7.400)
pH, Arterial: 7.327 — ABNORMAL LOW (ref 7.350–7.400)
pH, Arterial: 7.352 (ref 7.350–7.400)
pH, Arterial: 7.401 — ABNORMAL HIGH (ref 7.350–7.400)
pO2, Arterial: 152 mmHg — ABNORMAL HIGH (ref 80.0–100.0)
pO2, Arterial: 299 mmHg — ABNORMAL HIGH (ref 80.0–100.0)
pO2, Arterial: 55 mmHg — ABNORMAL LOW (ref 80.0–100.0)
pO2, Arterial: 69 mmHg — ABNORMAL LOW (ref 80.0–100.0)
pO2, Arterial: 79 mmHg — ABNORMAL LOW (ref 80.0–100.0)
pO2, Arterial: 84 mmHg (ref 80.0–100.0)

## 2011-07-02 LAB — APTT
aPTT: 32 seconds (ref 24–37)
aPTT: 34 seconds (ref 24–37)
aPTT: 36 seconds (ref 24–37)

## 2011-07-02 LAB — CULTURE, BLOOD (ROUTINE X 2): Culture: NO GROWTH

## 2011-07-02 LAB — URINE MICROSCOPIC-ADD ON

## 2011-07-02 LAB — B-NATRIURETIC PEPTIDE (CONVERTED LAB)
Pro B Natriuretic peptide (BNP): 719 pg/mL — ABNORMAL HIGH (ref 0.0–100.0)
Pro B Natriuretic peptide (BNP): 852 pg/mL — ABNORMAL HIGH (ref 0.0–100.0)
Pro B Natriuretic peptide (BNP): 968 pg/mL — ABNORMAL HIGH (ref 0.0–100.0)

## 2011-07-02 LAB — PREPARE FRESH FROZEN PLASMA

## 2011-07-02 LAB — POCT I-STAT 4, (NA,K, GLUC, HGB,HCT)
Glucose, Bld: 111 mg/dL — ABNORMAL HIGH (ref 70–99)
Glucose, Bld: 125 mg/dL — ABNORMAL HIGH (ref 70–99)
Glucose, Bld: 172 mg/dL — ABNORMAL HIGH (ref 70–99)
Glucose, Bld: 216 mg/dL — ABNORMAL HIGH (ref 70–99)
HCT: 21 % — ABNORMAL LOW (ref 36.0–46.0)
HCT: 22 % — ABNORMAL LOW (ref 36.0–46.0)
HCT: 27 % — ABNORMAL LOW (ref 36.0–46.0)
Hemoglobin: 10.9 g/dL — ABNORMAL LOW (ref 12.0–15.0)
Hemoglobin: 7.1 g/dL — CL (ref 12.0–15.0)
Hemoglobin: 7.5 g/dL — CL (ref 12.0–15.0)
Hemoglobin: 9.2 g/dL — ABNORMAL LOW (ref 12.0–15.0)
Hemoglobin: 9.5 g/dL — ABNORMAL LOW (ref 12.0–15.0)
Potassium: 4.2 mEq/L (ref 3.5–5.1)
Potassium: 4.6 mEq/L (ref 3.5–5.1)
Potassium: 5.3 mEq/L — ABNORMAL HIGH (ref 3.5–5.1)
Potassium: 6 mEq/L — ABNORMAL HIGH (ref 3.5–5.1)
Sodium: 135 mEq/L (ref 135–145)
Sodium: 135 mEq/L (ref 135–145)
Sodium: 136 mEq/L (ref 135–145)
Sodium: 138 mEq/L (ref 135–145)
Sodium: 141 mEq/L (ref 135–145)

## 2011-07-02 LAB — TYPE AND SCREEN
ABO/RH(D): O NEG
Antibody Screen: NEGATIVE

## 2011-07-02 LAB — CULTURE, RESPIRATORY W GRAM STAIN

## 2011-07-02 LAB — POCT I-STAT, CHEM 8
BUN: 22 mg/dL (ref 6–23)
Calcium, Ion: 1.19 mmol/L (ref 1.12–1.32)
Chloride: 106 mEq/L (ref 96–112)
Creatinine, Ser: 2.1 mg/dL — ABNORMAL HIGH (ref 0.4–1.2)
Glucose, Bld: 157 mg/dL — ABNORMAL HIGH (ref 70–99)
HCT: 28 % — ABNORMAL LOW (ref 36.0–46.0)
Hemoglobin: 11.6 g/dL — ABNORMAL LOW (ref 12.0–15.0)
Potassium: 4.4 mEq/L (ref 3.5–5.1)
Potassium: 5.2 mEq/L — ABNORMAL HIGH (ref 3.5–5.1)
TCO2: 24 mmol/L (ref 0–100)

## 2011-07-02 LAB — HEMATOCRIT: HCT: 26.4 % — ABNORMAL LOW (ref 36.0–46.0)

## 2011-07-02 LAB — URINE CULTURE
Colony Count: >=100000
Colony Count: NO GROWTH
Culture: NO GROWTH

## 2011-07-02 LAB — DIFFERENTIAL
Basophils Relative: 1 % (ref 0–1)
Eosinophils Relative: 1 % (ref 0–5)
Monocytes Relative: 13 % — ABNORMAL HIGH (ref 3–12)
Neutrophils Relative %: 71 % (ref 43–77)

## 2011-07-02 LAB — CULTURE, BLOOD (SINGLE): Culture: NO GROWTH

## 2011-07-02 LAB — CREATININE, SERUM
Creatinine, Ser: 1.25 mg/dL — ABNORMAL HIGH (ref 0.4–1.2)
GFR calc Af Amer: 51 mL/min — ABNORMAL LOW (ref >60)
GFR calc non Af Amer: 42 mL/min — ABNORMAL LOW (ref >60)

## 2011-07-02 LAB — PREPARE PLATELET PHERESIS

## 2011-07-02 LAB — CK TOTAL AND CKMB (NOT AT ARMC)
CK, MB: 1.2 ng/mL (ref 0.3–4.0)
Total CK: 101 U/L (ref 7–177)

## 2011-07-02 LAB — POCT CARDIAC MARKERS: CKMB, poc: 2.5 ng/mL (ref 1.0–8.0)

## 2011-07-02 LAB — EXPECTORATED SPUTUM ASSESSMENT W GRAM STAIN, RFLX TO RESP C

## 2011-07-02 LAB — LIPASE, BLOOD: Lipase: 38 U/L (ref 11–59)

## 2011-07-02 LAB — MAGNESIUM
Magnesium: 2.6 mg/dL — ABNORMAL HIGH (ref 1.5–2.5)
Magnesium: 2.6 mg/dL — ABNORMAL HIGH (ref 1.5–2.5)
Magnesium: 2.6 mg/dL — ABNORMAL HIGH (ref 1.5–2.5)

## 2011-07-02 LAB — PREPARE RBC (CROSSMATCH)

## 2011-10-11 ENCOUNTER — Telehealth: Payer: Self-pay | Admitting: Hematology & Oncology

## 2011-10-11 ENCOUNTER — Other Ambulatory Visit: Payer: Self-pay | Admitting: *Deleted

## 2011-10-11 NOTE — Telephone Encounter (Signed)
Pt aware of 1-15 lab at [facility]

## 2011-10-11 NOTE — Progress Notes (Signed)
Pt called stating that she thinks she needs to come in before her appt in the beginning of Feb for a possible injection. She has been drinking a lot of ice cold water and eating ice. Also feels more tired & cold. Explained that she could have her labs checked first as she could need iron (with or without an injection). She verbalized understanding and knows that she should receive a call back with the results of her labs within a few days after her appt. Should keep her MD appt as scheduled with Dr Myna Hidalgo.

## 2011-10-12 ENCOUNTER — Other Ambulatory Visit: Payer: Self-pay | Admitting: *Deleted

## 2011-10-12 ENCOUNTER — Other Ambulatory Visit: Payer: Self-pay | Admitting: Hematology & Oncology

## 2011-10-12 ENCOUNTER — Other Ambulatory Visit (HOSPITAL_BASED_OUTPATIENT_CLINIC_OR_DEPARTMENT_OTHER): Payer: Medicare Other | Admitting: Lab

## 2011-10-12 DIAGNOSIS — D509 Iron deficiency anemia, unspecified: Secondary | ICD-10-CM

## 2011-10-12 LAB — CBC WITH DIFFERENTIAL/PLATELET
Basophils Absolute: 0 10*3/uL (ref 0.0–0.1)
EOS%: 1.4 % (ref 0.0–7.0)
Eosinophils Absolute: 0.1 10*3/uL (ref 0.0–0.5)
LYMPH%: 17.5 % (ref 14.0–49.7)
MCH: 30.8 pg (ref 25.1–34.0)
MCV: 90 fL (ref 79.5–101.0)
MONO%: 12 % (ref 0.0–14.0)
NEUT#: 3.8 10*3/uL (ref 1.5–6.5)
Platelets: 122 10*3/uL — ABNORMAL LOW (ref 145–400)
RBC: 3.99 10*6/uL (ref 3.70–5.45)

## 2011-10-12 LAB — IRON AND TIBC
Iron: 60 ug/dL (ref 42–145)
UIBC: 285 ug/dL (ref 125–400)

## 2011-10-12 LAB — FERRITIN: Ferritin: 522 ng/mL — ABNORMAL HIGH (ref 10–291)

## 2011-11-02 ENCOUNTER — Telehealth: Payer: Self-pay | Admitting: Hematology & Oncology

## 2011-11-02 ENCOUNTER — Other Ambulatory Visit: Payer: Medicare Other | Admitting: Lab

## 2011-11-02 ENCOUNTER — Ambulatory Visit: Payer: Medicare Other | Admitting: Hematology & Oncology

## 2011-11-02 NOTE — Telephone Encounter (Signed)
Pt moved 2-6 to 3-1

## 2011-11-03 ENCOUNTER — Ambulatory Visit: Payer: Medicare Other | Admitting: Hematology & Oncology

## 2011-11-03 ENCOUNTER — Other Ambulatory Visit: Payer: Medicare Other | Admitting: Lab

## 2011-11-22 ENCOUNTER — Other Ambulatory Visit: Payer: Self-pay

## 2011-11-22 ENCOUNTER — Encounter (HOSPITAL_COMMUNITY): Payer: Self-pay | Admitting: Emergency Medicine

## 2011-11-22 ENCOUNTER — Inpatient Hospital Stay (HOSPITAL_COMMUNITY)
Admission: EM | Admit: 2011-11-22 | Discharge: 2011-11-25 | DRG: 880 | Disposition: A | Discharge status: Home Health | Payer: Medicare Other | Attending: Internal Medicine | Admitting: Internal Medicine

## 2011-11-22 ENCOUNTER — Emergency Department (HOSPITAL_COMMUNITY): Payer: Medicare Other

## 2011-11-22 DIAGNOSIS — F339 Major depressive disorder, recurrent, unspecified: Secondary | ICD-10-CM | POA: Diagnosis present

## 2011-11-22 DIAGNOSIS — I509 Heart failure, unspecified: Secondary | ICD-10-CM | POA: Diagnosis present

## 2011-11-22 DIAGNOSIS — Z951 Presence of aortocoronary bypass graft: Secondary | ICD-10-CM | POA: Diagnosis present

## 2011-11-22 DIAGNOSIS — I129 Hypertensive chronic kidney disease with stage 1 through stage 4 chronic kidney disease, or unspecified chronic kidney disease: Secondary | ICD-10-CM | POA: Diagnosis present

## 2011-11-22 DIAGNOSIS — D631 Anemia in chronic kidney disease: Secondary | ICD-10-CM | POA: Diagnosis present

## 2011-11-22 DIAGNOSIS — I447 Left bundle-branch block, unspecified: Secondary | ICD-10-CM | POA: Diagnosis present

## 2011-11-22 DIAGNOSIS — E1149 Type 2 diabetes mellitus with other diabetic neurological complication: Secondary | ICD-10-CM | POA: Diagnosis present

## 2011-11-22 DIAGNOSIS — E785 Hyperlipidemia, unspecified: Secondary | ICD-10-CM | POA: Insufficient documentation

## 2011-11-22 DIAGNOSIS — E860 Dehydration: Secondary | ICD-10-CM | POA: Diagnosis present

## 2011-11-22 DIAGNOSIS — Z96659 Presence of unspecified artificial knee joint: Secondary | ICD-10-CM

## 2011-11-22 DIAGNOSIS — F0280 Dementia in other diseases classified elsewhere without behavioral disturbance: Secondary | ICD-10-CM | POA: Diagnosis present

## 2011-11-22 DIAGNOSIS — F330 Major depressive disorder, recurrent, mild: Secondary | ICD-10-CM | POA: Insufficient documentation

## 2011-11-22 DIAGNOSIS — M810 Age-related osteoporosis without current pathological fracture: Secondary | ICD-10-CM | POA: Diagnosis present

## 2011-11-22 DIAGNOSIS — E1142 Type 2 diabetes mellitus with diabetic polyneuropathy: Secondary | ICD-10-CM | POA: Diagnosis present

## 2011-11-22 DIAGNOSIS — I4891 Unspecified atrial fibrillation: Secondary | ICD-10-CM | POA: Diagnosis present

## 2011-11-22 DIAGNOSIS — N189 Chronic kidney disease, unspecified: Secondary | ICD-10-CM | POA: Diagnosis present

## 2011-11-22 DIAGNOSIS — I5032 Chronic diastolic (congestive) heart failure: Secondary | ICD-10-CM | POA: Diagnosis present

## 2011-11-22 DIAGNOSIS — Z794 Long term (current) use of insulin: Secondary | ICD-10-CM

## 2011-11-22 DIAGNOSIS — I2510 Atherosclerotic heart disease of native coronary artery without angina pectoris: Secondary | ICD-10-CM | POA: Diagnosis present

## 2011-11-22 DIAGNOSIS — Z7982 Long term (current) use of aspirin: Secondary | ICD-10-CM

## 2011-11-22 DIAGNOSIS — E669 Obesity, unspecified: Secondary | ICD-10-CM | POA: Diagnosis present

## 2011-11-22 DIAGNOSIS — N039 Chronic nephritic syndrome with unspecified morphologic changes: Secondary | ICD-10-CM | POA: Diagnosis present

## 2011-11-22 DIAGNOSIS — R001 Bradycardia, unspecified: Secondary | ICD-10-CM | POA: Insufficient documentation

## 2011-11-22 DIAGNOSIS — I10 Essential (primary) hypertension: Secondary | ICD-10-CM | POA: Diagnosis present

## 2011-11-22 DIAGNOSIS — Z23 Encounter for immunization: Secondary | ICD-10-CM

## 2011-11-22 DIAGNOSIS — E876 Hypokalemia: Secondary | ICD-10-CM | POA: Diagnosis present

## 2011-11-22 DIAGNOSIS — Z9849 Cataract extraction status, unspecified eye: Secondary | ICD-10-CM

## 2011-11-22 DIAGNOSIS — Z79899 Other long term (current) drug therapy: Secondary | ICD-10-CM

## 2011-11-22 DIAGNOSIS — G309 Alzheimer's disease, unspecified: Secondary | ICD-10-CM | POA: Diagnosis present

## 2011-11-22 DIAGNOSIS — Z952 Presence of prosthetic heart valve: Secondary | ICD-10-CM | POA: Diagnosis present

## 2011-11-22 DIAGNOSIS — R410 Disorientation, unspecified: Secondary | ICD-10-CM

## 2011-11-22 DIAGNOSIS — F05 Delirium due to known physiological condition: Secondary | ICD-10-CM | POA: Diagnosis present

## 2011-11-22 DIAGNOSIS — G4733 Obstructive sleep apnea (adult) (pediatric): Secondary | ICD-10-CM | POA: Insufficient documentation

## 2011-11-22 LAB — URINALYSIS, ROUTINE W REFLEX MICROSCOPIC
Glucose, UA: NEGATIVE mg/dL
Ketones, ur: NEGATIVE mg/dL
Leukocytes, UA: NEGATIVE
Nitrite: NEGATIVE
pH: 7.5 (ref 5.0–8.0)

## 2011-11-22 LAB — COMPREHENSIVE METABOLIC PANEL
AST: 25 U/L (ref 0–37)
Albumin: 3.4 g/dL — ABNORMAL LOW (ref 3.5–5.2)
Calcium: 9.6 mg/dL (ref 8.4–10.5)
Creatinine, Ser: 1.17 mg/dL — ABNORMAL HIGH (ref 0.50–1.10)

## 2011-11-22 LAB — CBC
MCH: 30.6 pg (ref 26.0–34.0)
MCV: 94.1 fL (ref 78.0–100.0)
Platelets: 111 10*3/uL — ABNORMAL LOW (ref 150–400)
RDW: 16.1 % — ABNORMAL HIGH (ref 11.5–15.5)
WBC: 5.1 10*3/uL (ref 4.0–10.5)

## 2011-11-22 LAB — GLUCOSE, CAPILLARY: Glucose-Capillary: 226 mg/dL — ABNORMAL HIGH (ref 70–99)

## 2011-11-22 LAB — POCT I-STAT TROPONIN I: Troponin i, poc: 0.03 ng/mL (ref 0.00–0.08)

## 2011-11-22 LAB — PROTIME-INR
INR: 1.35 (ref 0.00–1.49)
Prothrombin Time: 16.9 seconds — ABNORMAL HIGH (ref 11.6–15.2)

## 2011-11-22 MED ORDER — WARFARIN SODIUM 5 MG PO TABS
5.0000 mg | ORAL_TABLET | ORAL | Status: AC
  Administered 2011-11-23: 5 mg via ORAL
  Filled 2011-11-22: qty 1

## 2011-11-22 MED ORDER — PRESERVISION AREDS 2 PO CAPS: 1.0000 | ORAL_CAPSULE | Freq: Every day | ORAL | Status: DC

## 2011-11-22 MED ORDER — OMEGA-3-ACID ETHYL ESTERS 1 G PO CAPS
1.0000 g | ORAL_CAPSULE | Freq: Every day | ORAL | Status: DC
  Administered 2011-11-23 – 2011-11-25 (×3): 1 g via ORAL
  Filled 2011-11-22 (×3): qty 1

## 2011-11-22 MED ORDER — DONEPEZIL HCL 10 MG PO TABS
10.0000 mg | ORAL_TABLET | Freq: Every evening | ORAL | Status: DC | PRN
  Filled 2011-11-22 (×2): qty 1

## 2011-11-22 MED ORDER — ASPIRIN 325 MG PO TABS
325.0000 mg | ORAL_TABLET | Freq: Every day | ORAL | Status: DC
  Filled 2011-11-22 (×2): qty 1

## 2011-11-22 MED ORDER — ONE-DAILY MULTI VITAMINS PO TABS: 1.0000 | ORAL_TABLET | Freq: Every day | ORAL | Status: DC

## 2011-11-22 MED ORDER — AMIODARONE HCL 100 MG PO TABS
100.0000 mg | ORAL_TABLET | Freq: Every day | ORAL | Status: DC
  Administered 2011-11-23 – 2011-11-25 (×3): 100 mg via ORAL
  Filled 2011-11-22 (×3): qty 1

## 2011-11-22 MED ORDER — OCUVITE-LUTEIN PO CAPS
2.0000 | ORAL_CAPSULE | Freq: Every day | ORAL | Status: DC
  Administered 2011-11-23 – 2011-11-25 (×2): 2 via ORAL
  Filled 2011-11-22 (×5): qty 2

## 2011-11-22 MED ORDER — ZOLPIDEM TARTRATE 5 MG PO TABS
5.0000 mg | ORAL_TABLET | Freq: Every evening | ORAL | Status: DC | PRN
  Administered 2011-11-23: 5 mg via ORAL
  Filled 2011-11-22: qty 1

## 2011-11-22 MED ORDER — METHOCARBAMOL 500 MG PO TABS
500.0000 mg | ORAL_TABLET | Freq: Two times a day (BID) | ORAL | Status: DC | PRN
  Filled 2011-11-22: qty 1

## 2011-11-22 MED ORDER — ADULT MULTIVITAMIN W/MINERALS CH
1.0000 | ORAL_TABLET | Freq: Every day | ORAL | Status: DC
  Administered 2011-11-23 – 2011-11-25 (×3): 1 via ORAL
  Filled 2011-11-22 (×3): qty 1

## 2011-11-22 MED ORDER — SODIUM CHLORIDE 0.9 % IV SOLN
INTRAVENOUS | Status: DC
  Administered 2011-11-22: 11:00:00 via INTRAVENOUS
  Administered 2011-11-23: 100 mL/h via INTRAVENOUS
  Administered 2011-11-23 – 2011-11-25 (×3): via INTRAVENOUS

## 2011-11-22 MED ORDER — GABAPENTIN 600 MG PO TABS
300.0000 mg | ORAL_TABLET | Freq: Two times a day (BID) | ORAL | Status: DC
  Filled 2011-11-22: qty 1

## 2011-11-22 MED ORDER — INSULIN GLARGINE 100 UNIT/ML ~~LOC~~ SOLN
14.0000 [IU] | Freq: Every day | SUBCUTANEOUS | Status: DC
  Administered 2011-11-23 – 2011-11-24 (×2): 14 [IU] via SUBCUTANEOUS
  Filled 2011-11-22 (×2): qty 3

## 2011-11-22 MED ORDER — ACETAMINOPHEN 325 MG PO TABS: 650.0000 mg | ORAL_TABLET | Freq: Four times a day (QID) | ORAL | Status: DC | PRN

## 2011-11-22 MED ORDER — SIMVASTATIN 10 MG PO TABS
10.0000 mg | ORAL_TABLET | Freq: Every day | ORAL | Status: DC
  Administered 2011-11-23 – 2011-11-24 (×2): 10 mg via ORAL
  Filled 2011-11-22 (×3): qty 1

## 2011-11-22 MED ORDER — GLIPIZIDE ER 10 MG PO TB24
10.0000 mg | ORAL_TABLET | Freq: Every day | ORAL | Status: DC
  Administered 2011-11-23 – 2011-11-25 (×3): 10 mg via ORAL
  Filled 2011-11-22 (×3): qty 1

## 2011-11-22 MED ORDER — SODIUM CHLORIDE 0.9 % IV SOLN
INTRAVENOUS | Status: DC
  Administered 2011-11-22: 22:00:00 via INTRAVENOUS

## 2011-11-22 MED ORDER — GABAPENTIN 600 MG PO TABS
600.0000 mg | ORAL_TABLET | Freq: Every day | ORAL | Status: DC
  Administered 2011-11-23 – 2011-11-24 (×2): 600 mg via ORAL
  Filled 2011-11-22 (×5): qty 1

## 2011-11-22 MED ORDER — PAROXETINE HCL 20 MG PO TABS
40.0000 mg | ORAL_TABLET | Freq: Every day | ORAL | Status: DC
  Administered 2011-11-23 – 2011-11-25 (×3): 40 mg via ORAL
  Filled 2011-11-22 (×3): qty 2

## 2011-11-22 MED ORDER — DOCUSATE SODIUM 100 MG PO CAPS
100.0000 mg | ORAL_CAPSULE | Freq: Two times a day (BID) | ORAL | Status: DC
  Administered 2011-11-23 – 2011-11-25 (×6): 100 mg via ORAL
  Filled 2011-11-22 (×7): qty 1

## 2011-11-22 MED ORDER — INSULIN ASPART 100 UNIT/ML ~~LOC~~ SOLN
0.0000 [IU] | Freq: Three times a day (TID) | SUBCUTANEOUS | Status: DC
  Administered 2011-11-23: 5 [IU] via SUBCUTANEOUS
  Administered 2011-11-23 – 2011-11-24 (×3): 3 [IU] via SUBCUTANEOUS
  Administered 2011-11-25: 2 [IU] via SUBCUTANEOUS
  Administered 2011-11-25: 3 [IU] via SUBCUTANEOUS
  Filled 2011-11-22 (×2): qty 3

## 2011-11-22 MED ORDER — HYDROCODONE-ACETAMINOPHEN 10-325 MG PO TABS: 1.0000 | ORAL_TABLET | ORAL | Status: DC | PRN

## 2011-11-22 MED ORDER — FERROUS SULFATE 325 (65 FE) MG PO TABS
325.0000 mg | ORAL_TABLET | Freq: Every day | ORAL | Status: DC
  Administered 2011-11-23 – 2011-11-25 (×3): 325 mg via ORAL
  Filled 2011-11-22 (×5): qty 1

## 2011-11-22 MED ORDER — ASPIRIN EC 81 MG PO TBEC
81.0000 mg | DELAYED_RELEASE_TABLET | Freq: Every day | ORAL | Status: DC
  Administered 2011-11-23 – 2011-11-25 (×3): 81 mg via ORAL
  Filled 2011-11-22 (×3): qty 1

## 2011-11-22 MED ORDER — CLONAZEPAM 0.5 MG PO TABS: 0.5000 mg | ORAL_TABLET | Freq: Two times a day (BID) | ORAL | Status: DC | PRN

## 2011-11-22 MED ORDER — POLYETHYLENE GLYCOL 3350 17 G PO PACK
17.0000 g | PACK | Freq: Every day | ORAL | Status: DC | PRN
  Filled 2011-11-22: qty 1

## 2011-11-22 MED ORDER — METOPROLOL TARTRATE 50 MG PO TABS
50.0000 mg | ORAL_TABLET | Freq: Two times a day (BID) | ORAL | Status: DC
  Administered 2011-11-23 – 2011-11-25 (×6): 50 mg via ORAL
  Filled 2011-11-22 (×7): qty 1

## 2011-11-22 MED ORDER — POTASSIUM CHLORIDE CRYS ER 20 MEQ PO TBCR
20.0000 meq | EXTENDED_RELEASE_TABLET | Freq: Two times a day (BID) | ORAL | Status: DC
  Administered 2011-11-23 – 2011-11-25 (×6): 20 meq via ORAL
  Filled 2011-11-22 (×7): qty 1

## 2011-11-22 MED ORDER — OMEGA-3 FATTY ACIDS 1000 MG PO CAPS: 1.0000 g | ORAL_CAPSULE | Freq: Every day | ORAL | Status: DC

## 2011-11-22 MED ORDER — GABAPENTIN 300 MG PO CAPS
300.0000 mg | ORAL_CAPSULE | Freq: Every day | ORAL | Status: DC
  Administered 2011-11-23 – 2011-11-24 (×2): 300 mg via ORAL
  Filled 2011-11-22 (×2): qty 1

## 2011-11-22 NOTE — H&P (Signed)
PCP:  [name], MD, MD Chief Complaint:  "I'm hearing voices and seeing things" of unspecified duration History obtainable from patient and patient's daughter.  HPI:  Patient is a 78 year old Caucasian female with history of diabetes mellitus, coronary artery disease status CABG and congestive heart failure presenting to the emergency room with history of hearing voices and seeing things. She denies any history of homicidal or suicidal ideations. She denies any chest pain or shortness of breath. No palpitations. She also denies any history of fever, chills or Rigors. She denies any abdominal discomfort. No diarrhea or hematochezia. No dysuria or hematuria. According to patient's daughter, patient was said not to be doing "too well" and subsequently brought to the emergency room to be evaluated.  Review of Systems:  The patient denies anorexia, fever, weight loss,, vision loss, decreased hearing, hoarseness, chest pain, syncope, dyspnea on exertion, peripheral edema, balance deficits, hemoptysis, abdominal pain, melena, hematochezia, severe indigestion/heartburn, hematuria, incontinence, genital sores, muscle weakness, suspicious skin lesions, transient blindness, difficulty walking, depression, unusual weight change, abnormal bleeding, enlarged lymph nodes, angioedema, and breast masses.  Past Medical History:  Past Medical History  Diagnosis Date  . Coronary artery disease 09/04/08    single vessel CABG at time of AVR   . Aortic stenosis 09/04/08    s/p AVR by Dr Tyrone Sage (tissue valve)  . Paroxysmal atrial fibrillation   . LBBB (left bundle branch block)   . CHF (congestive heart failure)     diastolic  . Sleep apnea   . Alzheimer's dementia   . Diabetes mellitus     type 2  . Hypertension   . Chronic anemia   . Osteoporosis   . Chronic renal failure     Past Surgical History  Procedure Date  . Coronary artery bypass graft 09/04/08    single vessel at time of AVR  .  Aortic valve replacement 12/09    tissue valve by Dr Tyrone Sage  . Cholecystectomy 1995  . Cataract extraction   . Replacement total knee bilateral     L 2000, R 1997    Medications:  Prior to Admission medications   Medication Sig Start Date End Date Taking? Authorizing Provider  acetaminophen (TYLENOL) 325 MG tablet Take 650 mg by mouth every 6 (six) hours as needed. For pain   Yes Historical Provider, MD  amiodarone (PACERONE) 200 MG tablet Take 100 mg by mouth daily.   Yes Historical Provider, MD  aspirin EC 81 MG tablet Take 81 mg by mouth daily.   Yes Historical Provider, MD  docusate sodium (COLACE) 100 MG capsule Take 100 mg by mouth 2 (two) times daily.    Yes Historical Provider, MD  donepezil (ARICEPT) 10 MG tablet Take 10 mg by mouth at bedtime as needed.     Yes Historical Provider, MD  ferrous sulfate (GNP IRON) 325 (65 FE) MG tablet Take 325 mg by mouth daily with breakfast.     Yes Historical Provider, MD  fish oil-omega-3 fatty acids 1000 MG capsule Take 1 g by mouth daily.    Yes Historical Provider, MD  furosemide (LASIX) 40 MG tablet Take 80 mg by mouth 2 (two) times daily.    Yes Historical Provider, MD  gabapentin (NEURONTIN) 600 MG tablet Take 300-600 mg by mouth 2 (two) times daily. Take 1/2 tablet AM and 1 tablet at bedtime   Yes Historical Provider, MD  glucose blood test strip 1 each by Other route as needed. One touch test strips.  Use  as instructed   Yes Historical Provider, MD  insulin glargine (LANTUS) 100 UNIT/ML injection Inject 14 Units into the skin at bedtime. Increasing by 2 units every 3 days until cbg gets to 120.  Increased to 14 units on 11/20/2011.   Yes Historical Provider, MD  Lancet Device MISC 1 Device by Does not apply route daily as needed. Lancet thin finger sticks   Yes Historical Provider, MD  methocarbamol (ROBAXIN) 500 MG tablet Take 500 mg by mouth 2 (two) times daily as needed. For muscle pain   Yes Historical Provider, MD  metolazone  (ZAROXOLYN) 2.5 MG tablet Take 2.5 mg by mouth once a week. On monday   Yes Historical Provider, MD  metoprolol tartrate (LOPRESSOR) 25 MG tablet Take 12.5 mg by mouth 2 (two) times daily.   Yes Historical Provider, MD  Multiple Vitamin (MULTIVITAMIN) tablet Take 1 tablet by mouth daily.     Yes Historical Provider, MD  Multiple Vitamins-Minerals (PRESERVISION AREDS 2) CAPS Take 1 capsule by mouth daily.   Yes Historical Provider, MD  PARoxetine (PAXIL) 40 MG tablet Take 40 mg by mouth every morning.     Yes Historical Provider, MD  polyethylene glycol (MIRALAX / GLYCOLAX) packet Take 17 g by mouth daily as needed. For constipation   Yes Historical Provider, MD  potassium chloride SA (K-DUR,KLOR-CON) 20 MEQ tablet Take 20 mEq by mouth 2 (two) times daily.    Yes Historical Provider, MD  simvastatin (ZOCOR) 10 MG tablet Take 10 mg by mouth at bedtime.     Yes Historical Provider, MD  zolpidem (AMBIEN) 5 MG tablet Take 5 mg by mouth at bedtime as needed. For sleep    Yes Historical Provider, MD    Allergies:  Allergies  Allergen Reactions  . Celecoxib     Social History:   reports that she has never smoked. She does not have any smokeless tobacco history on file. She reports that she does not drink alcohol or use illicit drugs.  Family History:  Family History  Problem Relation Age of Onset  . Other Father 28    deceased. multiple MI   . Lung cancer Mother 73    died    Physical Exam:  Filed Vitals:   11/22/11 1214 11/22/11 1412 11/22/11 1500 11/22/11 1849  BP: 126/64  129/66 115/59  Pulse: 90  84 103  Temp:  98.1 F (36.7 C)  98.8 F (37.1 C)  TempSrc:    Oral  Resp: 20  20 18   SpO2: 99%  96% 95%      General: Alert and oriented times three, well developed and nourished, not acute distress, dehydrated.  Eyes: PERRLA, pale conjunctiva, scleral anicterus  ENT: Dry oral mucosa, neck supple, no thyromegaly  Lungs: clear to ascultation, no wheeze, no crackles, no use  of accessory muscles  Cardiovascular: regular rate and rhythm, no regurgitation, no gallops, no murmurs. No carotid bruits, no JVD  Abdomen: soft, nontender, no organomegaly, bowel sounds are positive.  GU: not examined  Neuro: No lateralizing signs  Musculoskeletal: Arthritic changes in the knees and the feet, no pedal edema  Skin: Decreased turgor  Psych: appropriate affect  ?  Labs on Admission:   [facility] 11/22/11 1116  NA 138  K 3.1*  CL 97  CO2 30  GLUCOSE 171*  BUN 25*  CREATININE 1.17*  CALCIUM 9.6  MG --  PHOS --     Basename 11/22/11 1116  AST 25  ALT 12  ALKPHOS 106  BILITOT 1.3*  PROT 7.5  ALBUMIN 3.4*    No results found for this basename: LIPASE:2,AMYLASE:2 in the last 72 hours   Basename 11/22/11 1116  WBC 5.1  NEUTROABS --  HGB 11.5*  HCT 35.4*  MCV 94.1  PLT 111*    No results found for this basename: CKTOTAL:3,CKMB:3,CKMBINDEX:3,TROPONINI:3 in the last 72 hours  No results found for this basename: TSH,T4TOTAL,FREET3,T3FREE,THYROIDAB in the last 72 hours  No results found for this basename: VITAMINB12:2,FOLATE:2,FERRITIN:2,TIBC:2,IRON:2,RETICCTPCT:2 in the last 72 hours  Radiological Exams on Admission:  Dg Chest 2 View  11/22/2011  *RADIOLOGY REPORT*  Clinical Data: Shortness of breath  CHEST - 2 VIEW  Comparison: 10/10/2008  Findings: Chronic interstitial markings.  No frank interstitial edema.  No pleural effusion or pneumothorax.  Stable cardiomegaly.  Valve replacement.  Degenerative changes of the visualized thoracolumbar spine.  Cholecystectomy clips.  IMPRESSION: Stable cardiomegaly with chronic interstitial markings.  No frank interstitial edema.  Original Report Authenticated By: Charline Bills, M.D.   Ct Head Wo Contrast  11/22/2011  *RADIOLOGY REPORT*  Clinical Data: Altered mental status, auditory hallucinations  CT HEAD WITHOUT CONTRAST  Technique:  Contiguous axial images were obtained from the base of the skull  through the vertex without contrast.  Comparison: 04/06/2011  Findings: No evidence of parenchymal hemorrhage or extra-axial fluid collection. No mass lesion, mass effect, or midline shift.  No CT evidence of acute infarction.  Old left basal ganglia lacunar infarct. Subcortical white matter and periventricular small vessel ischemic changes.  Intracranial atherosclerosis.  Mild global cortical atrophy.  No ventriculomegaly.  The visualized paranasal sinuses are essentially clear. The mastoid air cells are unopacified.  No evidence of calvarial fracture.  IMPRESSION: No evidence of acute intracranial abnormality.  Atrophy with small vessel ischemic changes and intracranial atherosclerosis.  Original Report Authenticated By: Charline Bills, M.D.    Assessment/Plan  Present on Admission:   Problems: #1 auditory and visual hallucinations-no suicidal or homicidal ideations. #2 dehydration #3 hypokalemia  Impression: #1 auditory and visual hallucinations #2 dehydration #3 acute on chronic kidney disease #4 hypokalemia #5 coronary artery disease status post CABG #6 history of congestive heart failure questionable systole dysfunction #7 diabetes mellitus #8 history of dementia #9 depression #10 atrial fibrillation #11 peripheral neuropathy #12 hypertension.  Plan: #1 admit patient to general medical floor #2 gentle IV hydration with normal saline #3 potassium repletion #4 restart home meds except diuretics #5 labs; CBC, CMP and magnesium repeated in a.m. #6 consult physical therapy in a.m. #7 consult psychiatry in a.m. Patient be evaluated daily       Talmage Nap             [phone]

## 2011-11-22 NOTE — ED Provider Notes (Signed)
History     CSN: [id number]  Arrival date & time 11/22/11  1003   First MD Initiated Contact with Patient 11/22/11 1016      Chief Complaint  Patient presents with  . Shortness of Breath  . Altered Mental Status    (Consider location/radiation/quality/duration/timing/severity/associated sxs/prior treatment) Patient is a 78 y.o. female presenting with shortness of breath and altered mental status. The history is provided by the patient and a relative.  Shortness of Breath  The current episode started today. The problem has been unchanged. The problem is moderate. Associated symptoms include shortness of breath. Pertinent negatives include no chest pain, no fever, no cough and no wheezing.  Altered Mental Status Associated symptoms include shortness of breath. Pertinent negatives include no chest pain.   Patient with history of congestive heart failure also family with complaint of confusion particularly at night associated with hallucinations no true history of dementia to this point. Patient arrived in the emergency department alert awake confused and no shortness of breath satting upper 90% on 2 L of oxygen.   Level V caveat applies this I will obtain a precise history from the patient.  Past Medical History  Diagnosis Date  . Coronary artery disease 09/04/08    single vessel CABG at time of AVR   . Aortic stenosis 09/04/08    s/p AVR by Dr Tyrone Sage (tissue valve)  . Paroxysmal atrial fibrillation   . LBBB (left bundle branch block)   . CHF (congestive heart failure)     diastolic  . Sleep apnea   . Alzheimer's dementia   . Diabetes mellitus     type 2  . Hypertension   . Chronic anemia   . Osteoporosis   . Chronic renal failure     Past Surgical History  Procedure Date  . Coronary artery bypass graft 09/04/08    single vessel at time of AVR  . Aortic valve replacement 12/09    tissue valve by Dr Tyrone Sage  . Cholecystectomy 1995  . Cataract extraction   .  Replacement total knee bilateral     L 2000, R 1997    Family History  Problem Relation Age of Onset  . Other Father 37    deceased. multiple MI   . Lung cancer Mother 35    died    History  Substance Use Topics  . Smoking status: Never Smoker   . Smokeless tobacco: Not on file  . Alcohol Use: No    OB History    Grav Para Term Preterm Abortions TAB SAB Ect Mult Living                  Review of Systems  Unable to perform ROS Constitutional: Negative for fever.  Respiratory: Positive for shortness of breath. Negative for cough and wheezing.   Cardiovascular: Negative for chest pain.  Psychiatric/Behavioral: Positive for altered mental status.   level V caveat applies able to get a precise review of system history.  Allergies  Celecoxib  Home Medications   Current Outpatient Rx  Name Route Sig Dispense Refill  . ACETAMINOPHEN 325 MG PO TABS Oral Take 650 mg by mouth every 6 (six) hours as needed. For pain    . AMIODARONE HCL 200 MG PO TABS Oral Take 100 mg by mouth daily.    . ASPIRIN EC 81 MG PO TBEC Oral Take 81 mg by mouth daily.    Marland Kitchen DOCUSATE SODIUM 100 MG PO CAPS Oral Take 100 mg  by mouth 2 (two) times daily.     . DONEPEZIL HCL 10 MG PO TABS Oral Take 10 mg by mouth at bedtime as needed.      Marland Kitchen FERROUS SULFATE 325 (65 FE) MG PO TABS Oral Take 325 mg by mouth daily with breakfast.      . OMEGA-3 FATTY ACIDS 1000 MG PO CAPS Oral Take 1 g by mouth daily.     . FUROSEMIDE 40 MG PO TABS Oral Take 80 mg by mouth 2 (two) times daily.     Marland Kitchen GABAPENTIN 600 MG PO TABS Oral Take 300-600 mg by mouth 2 (two) times daily. Take 1/2 tablet AM and 1 tablet at bedtime    . GLUCOSE BLOOD VI STRP Other 1 each by Other route as needed. One touch test strips.  Use as instructed    . INSULIN GLARGINE 100 UNIT/ML [place] SOLN Subcutaneous Inject 14 Units into the skin at bedtime. Increasing by 2 units every 3 days until cbg gets to 120.  Increased to 14 units on 11/20/2011.    Marland Kitchen LANCET  DEVICE MISC Does not apply 1 Device by Does not apply route daily as needed. Lancet thin finger sticks    . METHOCARBAMOL 500 MG PO TABS Oral Take 500 mg by mouth 2 (two) times daily as needed. For muscle pain    . METOLAZONE 2.5 MG PO TABS Oral Take 2.5 mg by mouth once a week. On monday    . METOPROLOL TARTRATE 25 MG PO TABS Oral Take 12.5 mg by mouth 2 (two) times daily.    Marland Kitchen ONE-DAILY MULTI VITAMINS PO TABS Oral Take 1 tablet by mouth daily.      Marland Kitchen PRESERVISION AREDS 2 PO CAPS Oral Take 1 capsule by mouth daily.    Marland Kitchen PAROXETINE HCL 40 MG PO TABS Oral Take 40 mg by mouth every morning.      Marland Kitchen POLYETHYLENE GLYCOL 3350 PO PACK Oral Take 17 g by mouth daily as needed. For constipation    . POTASSIUM CHLORIDE CRYS ER 20 MEQ PO TBCR Oral Take 20 mEq by mouth 2 (two) times daily.     Marland Kitchen SIMVASTATIN 10 MG PO TABS Oral Take 10 mg by mouth at bedtime.      Marland Kitchen ZOLPIDEM TARTRATE 5 MG PO TABS Oral Take 5 mg by mouth at bedtime as needed. For sleep       BP 129/66  Pulse 84  Temp(Src) 98.1 F (36.7 C) (Oral)  Resp 20  SpO2 96%  Physical Exam  Nursing note and vitals reviewed. Constitutional: She appears well-developed and well-nourished.  HENT:  Head: Normocephalic and atraumatic.  Mouth/Throat: Oropharynx is clear and moist.  Eyes: Conjunctivae and EOM are normal. Pupils are equal, round, and reactive to light.  Neck: Normal range of motion. Neck supple.  Cardiovascular: Regular rhythm, normal heart sounds and intact distal pulses.   No murmur heard.      Slight tachycardia  Pulmonary/Chest: Effort normal and breath sounds normal. No respiratory distress. She has no wheezes. She has no rales.  Abdominal: Soft. Bowel sounds are normal. There is no tenderness.  Musculoskeletal: Normal range of motion.  Neurological: She is alert. No cranial nerve deficit. She exhibits normal muscle tone. Coordination normal.  Skin: Skin is warm. No rash noted.    ED Course  Procedures (including critical  care time)  Labs Reviewed  CBC - Abnormal; Notable for the following:    RBC 3.76 (*)    Hemoglobin  11.5 (*)    HCT 35.4 (*)    RDW 16.1 (*)    Platelets 111 (*) PLATELET COUNT CONFIRMED BY SMEAR   All other components within normal limits  COMPREHENSIVE METABOLIC PANEL - Abnormal; Notable for the following:    Potassium 3.1 (*)    Glucose, Bld 171 (*)    BUN 25 (*)    Creatinine, Ser 1.17 (*)    Albumin 3.4 (*)    Total Bilirubin 1.3 (*)    GFR calc non Af Amer 44 (*)    GFR calc Af Amer 51 (*)    All other components within normal limits  PRO B NATRIURETIC PEPTIDE - Abnormal; Notable for the following:    Pro B Natriuretic peptide (BNP) 7579.0 (*)    All other components within normal limits  URINALYSIS, ROUTINE W REFLEX MICROSCOPIC  POCT I-STAT TROPONIN I   Dg Chest 2 View  11/22/2011  *RADIOLOGY REPORT*  Clinical Data: Shortness of breath  CHEST - 2 VIEW  Comparison: 10/10/2008  Findings: Chronic interstitial markings.  No frank interstitial edema.  No pleural effusion or pneumothorax.  Stable cardiomegaly.  Valve replacement.  Degenerative changes of the visualized thoracolumbar spine.  Cholecystectomy clips.  IMPRESSION: Stable cardiomegaly with chronic interstitial markings.  No frank interstitial edema.  Original Report Authenticated By: Charline Bills, M.D.   Ct Head Wo Contrast  11/22/2011  *RADIOLOGY REPORT*  Clinical Data: Altered mental status, auditory hallucinations  CT HEAD WITHOUT CONTRAST  Technique:  Contiguous axial images were obtained from the base of the skull through the vertex without contrast.  Comparison: 04/06/2011  Findings: No evidence of parenchymal hemorrhage or extra-axial fluid collection. No mass lesion, mass effect, or midline shift.  No CT evidence of acute infarction.  Old left basal ganglia lacunar infarct. Subcortical white matter and periventricular small vessel ischemic changes.  Intracranial atherosclerosis.  Mild global cortical atrophy.  No  ventriculomegaly.  The visualized paranasal sinuses are essentially clear. The mastoid air cells are unopacified.  No evidence of calvarial fracture.  IMPRESSION: No evidence of acute intracranial abnormality.  Atrophy with small vessel ischemic changes and intracranial atherosclerosis.  Original Report Authenticated By: Charline Bills, M.D.   Results for orders placed during the hospital encounter of 11/22/11  CBC      Component Value Range   WBC 5.1  4.0 - 10.5 (K/uL)   RBC 3.76 (*) 3.87 - 5.11 (MIL/uL)   Hemoglobin 11.5 (*) 12.0 - 15.0 (g/dL)   HCT 96.0 (*) 45.4 - 46.0 (%)   MCV 94.1  78.0 - 100.0 (fL)   MCH 30.6  26.0 - 34.0 (pg)   MCHC 32.5  30.0 - 36.0 (g/dL)   RDW 09.8 (*) 11.9 - 15.5 (%)   Platelets 111 (*) 150 - 400 (K/uL)  COMPREHENSIVE METABOLIC PANEL      Component Value Range   Sodium 138  135 - 145 (mEq/L)   Potassium 3.1 (*) 3.5 - 5.1 (mEq/L)   Chloride 97  96 - 112 (mEq/L)   CO2 30  19 - 32 (mEq/L)   Glucose, Bld 171 (*) 70 - 99 (mg/dL)   BUN 25 (*) 6 - 23 (mg/dL)   Creatinine, Ser 1.47 (*) 0.50 - 1.10 (mg/dL)   Calcium 9.6  8.4 - 82.9 (mg/dL)   Total Protein 7.5  6.0 - 8.3 (g/dL)   Albumin 3.4 (*) 3.5 - 5.2 (g/dL)   AST 25  0 - 37 (U/L)   ALT 12  0 -  35 (U/L)   Alkaline Phosphatase 106  39 - 117 (U/L)   Total Bilirubin 1.3 (*) 0.3 - 1.2 (mg/dL)   GFR calc non Af Amer 44 (*) >90 (mL/min)   GFR calc Af Amer 51 (*) >90 (mL/min)  PRO B NATRIURETIC PEPTIDE      Component Value Range   Pro B Natriuretic peptide (BNP) 7579.0 (*) 0 - 450 (pg/mL)  URINALYSIS, ROUTINE W REFLEX MICROSCOPIC      Component Value Range   Color, Urine YELLOW  YELLOW    APPearance CLEAR  CLEAR    Specific Gravity, Urine 1.010  1.005 - 1.030    pH 7.5  5.0 - 8.0    Glucose, UA NEGATIVE  NEGATIVE (mg/dL)   Hgb urine dipstick NEGATIVE  NEGATIVE    Bilirubin Urine NEGATIVE  NEGATIVE    Ketones, ur NEGATIVE  NEGATIVE (mg/dL)   Protein, ur NEGATIVE  NEGATIVE (mg/dL)   Urobilinogen, UA 0.2   0.0 - 1.0 (mg/dL)   Nitrite NEGATIVE  NEGATIVE    Leukocytes, UA NEGATIVE  NEGATIVE   POCT I-STAT TROPONIN I      Component Value Range   Troponin i, poc 0.03  0.00 - 0.08 (ng/mL)   Comment 3             Date: 11/22/2011  Rate: 100  Rhythm: sinus tachycardia  QRS Axis: normal  Intervals: normal  ST/T Wave abnormalities: nonspecific ST/T changes  Conduction Disutrbances:none  Narrative Interpretation:   Old EKG Reviewed: unchanged Changes from 09/05/2008 EKG today shows a left bundle branch block pattern with occasional premature ventricular contractions actually very similar to the previous EKG   1. Confusion       MDM   Workup in the emergency department without significant findings family claims that patient's had significant bouts of confusion hallucinations but alert and oxygen satting at home has been low night. she has a history of sleep apnea but refuses to wear the mask that she threw away family states he can't take care of at home sometimes being overlooked that she is committed contacted [facility] since she is followed by your physicians and they will evaluate the patient for consideration for admission.  Chest x-ray negative for pneumonia, UA negative for urinary tract. Head CT without acute findings. Also chest x-ray negative for congestive heart failure.         Shelda Jakes, MD 11/22/11 [phone]

## 2011-11-22 NOTE — ED Notes (Signed)
Pt here for increased SOB and altered mental status starting yesterday; per family pt weight is up with fluid; per family pt hallucinating last night

## 2011-11-22 NOTE — Progress Notes (Addendum)
ANTICOAGULATION CONSULT NOTE - Initial Consult  Pharmacy Consult for Warfarin Indication: atrial fibrillation  Allergies  Allergen Reactions  . Celecoxib     Patient Measurements:   Vital Signs: Temp: 98.6 F (37 C) (02/25 1920) Temp src: Oral (02/25 1920) BP: 122/67 mmHg (02/25 1920) Pulse Rate: 103  (02/25 1900)  Labs:  Basename 11/22/11 2005 11/22/11 1116  HGB -- 11.5*  HCT -- 35.4*  PLT -- 111*  APTT -- --  LABPROT 16.9* --  INR 1.35 --  HEPARINUNFRC -- --  CREATININE -- 1.17*  CKTOTAL -- --  CKMB -- --  TROPONINI -- --   The CrCl is unknown because both a height and weight (above a minimum accepted value) are required for this calculation.  Medical History: Past Medical History  Diagnosis Date  . Coronary artery disease 09/04/08    single vessel CABG at time of AVR   . Aortic stenosis 09/04/08    s/p AVR by Dr Tyrone Sage (tissue valve)  . Paroxysmal atrial fibrillation   . LBBB (left bundle branch block)   . CHF (congestive heart failure)     diastolic  . Sleep apnea   . Alzheimer's dementia   . Diabetes mellitus     type 2  . Hypertension   . Chronic anemia   . Osteoporosis   . Chronic renal failure     Medications:   (Not in a hospital admission)  Assessment: 78 yo F admitted on 11/22/11 complaining of hearing voices.  Pharmacy consulted to continue warfarin for atrial fibrillation  Home warfarin dose 5 mg daily  Goal of Therapy:  INR 2-3   Plan:  1. INR now 2. Daily INR and CBC 3. Order warfarin dose based on INR.  9:20 PM Baseline INR reported as 1.35 will give 5 mg warfarin tonight.  [name] 11/22/2011,7:56 PM

## 2011-11-22 NOTE — ED Notes (Signed)
Family states that for the past couple of days the patient has been having transient confusion. She has been progressively getting weaker. Upon arrival to dept pt is alert and oriented. She does seem to be generally weak. Stroke swallow screen completed and pt has passed. Iv started and labs obtained. Protocols initiated upon arrival to dept. Pt has no complaints of pain besides her normal chronic right shoulder pain which is due to a torn rotator cuff. Pt has baseline incontinence. Family at the bedside. Nursing to continue to monitor.

## 2011-11-23 LAB — DIFFERENTIAL
Basophils Relative: 0 % (ref 0–1)
Eosinophils Absolute: 0.2 10*3/uL (ref 0.0–0.7)
Eosinophils Relative: 3 % (ref 0–5)
Lymphs Abs: 1.4 10*3/uL (ref 0.7–4.0)
Monocytes Relative: 15 % — ABNORMAL HIGH (ref 3–12)

## 2011-11-23 LAB — COMPREHENSIVE METABOLIC PANEL
AST: 22 U/L (ref 0–37)
Albumin: 3.2 g/dL — ABNORMAL LOW (ref 3.5–5.2)
Alkaline Phosphatase: 93 U/L (ref 39–117)
BUN: 23 mg/dL (ref 6–23)
CO2: 31 mEq/L (ref 19–32)
Chloride: 98 mEq/L (ref 96–112)
GFR calc non Af Amer: 46 mL/min — ABNORMAL LOW (ref >90)
Potassium: 2.8 mEq/L — ABNORMAL LOW (ref 3.5–5.1)
Total Bilirubin: 1.2 mg/dL (ref 0.3–1.2)

## 2011-11-23 LAB — CBC
HCT: 34 % — ABNORMAL LOW (ref 36.0–46.0)
Hemoglobin: 10.8 g/dL — ABNORMAL LOW (ref 12.0–15.0)
MCV: 94.7 fL (ref 78.0–100.0)
RBC: 3.59 MIL/uL — ABNORMAL LOW (ref 3.87–5.11)
RDW: 16.1 % — ABNORMAL HIGH (ref 11.5–15.5)
WBC: 5.5 10*3/uL (ref 4.0–10.5)

## 2011-11-23 LAB — GLUCOSE, CAPILLARY
Glucose-Capillary: 178 mg/dL — ABNORMAL HIGH (ref 70–99)
Glucose-Capillary: 217 mg/dL — ABNORMAL HIGH (ref 70–99)
Glucose-Capillary: 159 mg/dL — ABNORMAL HIGH (ref 70–99)

## 2011-11-23 LAB — MAGNESIUM: Magnesium: 2 mg/dL (ref 1.5–2.5)

## 2011-11-23 MED ORDER — POTASSIUM CHLORIDE CRYS ER 20 MEQ PO TBCR
40.0000 meq | EXTENDED_RELEASE_TABLET | Freq: Once | ORAL | Status: AC
  Administered 2011-11-23: 40 meq via ORAL
  Filled 2011-11-23: qty 2

## 2011-11-23 MED ORDER — PNEUMOCOCCAL VAC POLYVALENT 25 MCG/0.5ML IJ INJ
0.5000 mL | INJECTION | INTRAMUSCULAR | Status: AC
  Administered 2011-11-24: 0.5 mL via INTRAMUSCULAR
  Filled 2011-11-23: qty 0.5

## 2011-11-23 MED ORDER — WARFARIN SODIUM 7.5 MG PO TABS
7.5000 mg | ORAL_TABLET | Freq: Once | ORAL | Status: AC
  Administered 2011-11-23: 7.5 mg via ORAL
  Filled 2011-11-23: qty 1

## 2011-11-23 NOTE — Evaluation (Signed)
Physical Therapy Evaluation Patient Details Name: [name] MRN: [id number] DOB: 1933-07-09 Today's Date: 11/23/2011  Problem List:  Patient Active Problem List  Diagnoses  . AODM  . DIABETIC PERIPHERAL NEUROPATHY  . HYPERLIPIDEMIA  . OBESITY, MORBID  . ANEMIA OF RENAL FAILURE  . MAJOR DEPRESSIVE DISORDER RECURRENT EPISODE MILD  . SLEEP APNEA, OBSTRUCTIVE, MILD  . ALZHEIMERS DISEASE  . HYPERTENSION, CONTROLLED  . PAROXYSMAL ATRIAL FIBRILLATION  . ATHEROSCLEROTIC CARDIOVASCULAR DISEASE  . MUSCLE WEAKNESS (GENERALIZED)  . AORTIC VALVE REPLACEMENT, HX OF  . Bradycardia    Past Medical History:  Past Medical History  Diagnosis Date  . Coronary artery disease 09/04/08    single vessel CABG at time of AVR   . Aortic stenosis 09/04/08    s/p AVR by Dr Tyrone Sage (tissue valve)  . Paroxysmal atrial fibrillation   . LBBB (left bundle branch block)   . CHF (congestive heart failure)     diastolic  . Sleep apnea   . Alzheimer's dementia   . Diabetes mellitus     type 2  . Hypertension   . Chronic anemia   . Osteoporosis   . Chronic renal failure    Past Surgical History:  Past Surgical History  Procedure Date  . Coronary artery bypass graft 09/04/08    single vessel at time of AVR  . Aortic valve replacement 12/09    tissue valve by Dr Tyrone Sage  . Cholecystectomy 1995  . Cataract extraction   . Replacement total knee bilateral     L 2000, R 1997    PT Assessment/Plan/Recommendation PT Assessment Clinical Impression Statement: Pt adm with hallcuinations.  Pt close to baseline with mobility.  Should be able to return home with family and HHPT PT Recommendation/Assessment: Patient will need skilled PT in the acute care venue PT Problem List: Decreased strength;Decreased activity tolerance;Decreased balance;Decreased mobility;Obesity PT Therapy Diagnosis : Difficulty walking;Generalized weakness PT Plan PT Frequency: Min 3X/week PT Treatment/Interventions: DME  instruction;Gait training;Functional mobility training;Therapeutic activities;Therapeutic exercise;Balance training;Patient/family education PT Recommendation Follow Up Recommendations: Home health PT Equipment Recommended: None recommended by PT PT Goals  Acute Rehab PT Goals PT Goal Formulation: With patient Time For Goal Achievement: 7 days Pt will go Sit to Supine/Side: with modified independence PT Goal: Sit to Supine/Side - Progress: Goal set today Pt will go Sit to Stand: with modified independence PT Goal: Sit to Stand - Progress: Goal set today Pt will go Stand to Sit: with modified independence PT Goal: Stand to Sit - Progress: Goal set today Pt will Ambulate: 51 - 150 feet;with supervision;with least restrictive assistive device PT Goal: Ambulate - Progress: Goal set today  PT Evaluation Precautions/Restrictions  Precautions Precautions: Fall Restrictions Weight Bearing Restrictions: Yes Prior Functioning  Home Living Lives With: Daughter (daughter works) Actor Help From: Personal care attendant (aide 2 days/week 4 hours/day) Type of Home: House Home Layout: One level Home Access: Ramped entrance Bathroom Shower/Tub: Engineer, manufacturing systems: Standard (with riser) Home Adaptive Equipment: Shower chair without back;Walker - rolling;Straight cane (toilet riser without handles) Prior Function Level of Independence: Independent with transfers;Independent with gait;Other (comment);Needs assistance with ADLs (bath tub only when daughter there to assist) Bath: Supervision/set-up Driving: No Vocation: Retired Financial risk analyst Arousal/Alertness: Awake/alert Overall Cognitive Status: Appears within functional limits for tasks assessed Orientation Level: Oriented X4 Sensation/Coordination   Extremity Assessment RLE Strength RLE Overall Strength Comments: grossly 4/5 LLE Strength LLE Overall Strength Comments: grossly 4/5 Mobility (including Balance) Bed  Mobility Bed Mobility: Yes  Supine to Sit: 6: Modified independent (Device/Increase time);With rails;HOB elevated (Comment degrees) (HOB 20 degrees) Sitting - Scoot to Edge of Bed: 6: Modified independent (Device/Increase time) Sit to Supine: 4: Min assist Sit to Supine - Details (indicate cue type and reason): assist for legs Transfers Transfers: Yes Sit to Stand: 5: Supervision;From toilet;From bed;With upper extremity assist Stand to Sit: 5: Supervision;4: Min assist;With upper extremity assist;To toilet;To bed Stand to Sit Details: assist to control descent to toilet. Ambulation/Gait Ambulation/Gait: Yes Ambulation/Gait Assistance: 5: Supervision Ambulation Distance (Feet): 25 Feet Assistive device: Rolling walker Gait Pattern: Trunk flexed;Decreased step length - right;Decreased step length - left (wide base) Gait velocity: slow cadence  Static Standing Balance Static Standing - Balance Support: Bilateral upper extremity supported (on walker) Static Standing - Level of Assistance: 5: Stand by assistance Exercise    End of Session PT - End of Session Equipment Utilized During Treatment: Gait belt Activity Tolerance: Patient tolerated treatment well Patient left: in bed;with call bell in reach (back to bed due to IV team coming to start IV) Nurse Communication: Mobility status for ambulation;Mobility status for transfers  [facility] 11/23/2011, 11:14 AM  [facility] PT [phone]

## 2011-11-23 NOTE — Progress Notes (Signed)
ANTICOAGULATION CONSULT NOTE - Follow Up Consult  Pharmacy Consult for Warfarin Indication: atrial fibrillation  Allergies  Allergen Reactions  . Celecoxib     Patient Measurements: Height: 5\' 2"  (157.5 cm) Weight: 259 lb 14.8 oz (117.9 kg) IBW/kg (Calculated) : 50.1    Vital Signs: Temp: 98 F (36.7 C) (02/26 1400) Temp src: Oral (02/26 1400) BP: 108/50 mmHg (02/26 1400) Pulse Rate: 86  (02/26 1400)  Labs:  Basename 11/23/11 1428 11/23/11 0550 11/22/11 2005 11/22/11 1116  HGB -- 10.8* -- 11.5*  HCT -- 34.0* -- 35.4*  PLT -- 104* -- 111*  APTT -- -- -- --  LABPROT 16.1* -- 16.9* --  INR 1.26 -- 1.35 --  HEPARINUNFRC -- -- -- --  CREATININE -- 1.13* -- 1.17*  CKTOTAL -- -- -- --  CKMB -- -- -- --  TROPONINI -- -- -- --   Estimated Creatinine Clearance: 51.6 ml/min (by C-G formula based on Cr of 1.13).   Medications:  Prescriptions prior to admission  Medication Sig Dispense Refill  . acetaminophen (TYLENOL) 325 MG tablet Take 650 mg by mouth every 6 (six) hours as needed. For pain      . amiodarone (PACERONE) 200 MG tablet Take 100 mg by mouth daily.      Marland Kitchen aspirin EC 81 MG tablet Take 81 mg by mouth daily.      Marland Kitchen docusate sodium (COLACE) 100 MG capsule Take 100 mg by mouth 2 (two) times daily.       Marland Kitchen donepezil (ARICEPT) 10 MG tablet Take 10 mg by mouth at bedtime as needed.        . ferrous sulfate (GNP IRON) 325 (65 FE) MG tablet Take 325 mg by mouth daily with breakfast.        . fish oil-omega-3 fatty acids 1000 MG capsule Take 1 g by mouth daily.       . furosemide (LASIX) 40 MG tablet Take 80 mg by mouth 2 (two) times daily.       Marland Kitchen gabapentin (NEURONTIN) 600 MG tablet Take 300-600 mg by mouth 2 (two) times daily. Take 1/2 tablet AM and 1 tablet at bedtime      . glucose blood test strip 1 each by Other route as needed. One touch test strips.  Use as instructed      . insulin glargine (LANTUS) 100 UNIT/ML injection Inject 14 Units into the skin at bedtime.  Increasing by 2 units every 3 days until cbg gets to 120.  Increased to 14 units on 11/20/2011.      Marland Kitchen Lancet Device MISC 1 Device by Does not apply route daily as needed. Lancet thin finger sticks      . methocarbamol (ROBAXIN) 500 MG tablet Take 500 mg by mouth 2 (two) times daily as needed. For muscle pain      . metolazone (ZAROXOLYN) 2.5 MG tablet Take 2.5 mg by mouth once a week. On monday      . metoprolol tartrate (LOPRESSOR) 25 MG tablet Take 12.5 mg by mouth 2 (two) times daily.      . Multiple Vitamin (MULTIVITAMIN) tablet Take 1 tablet by mouth daily.        . Multiple Vitamins-Minerals (PRESERVISION AREDS 2) CAPS Take 1 capsule by mouth daily.      Marland Kitchen PARoxetine (PAXIL) 40 MG tablet Take 40 mg by mouth every morning.        . polyethylene glycol (MIRALAX / GLYCOLAX) packet Take 17 g by mouth  daily as needed. For constipation      . potassium chloride SA (K-DUR,KLOR-CON) 20 MEQ tablet Take 20 mEq by mouth 2 (two) times daily.       . simvastatin (ZOCOR) 10 MG tablet Take 10 mg by mouth at bedtime.        Marland Kitchen zolpidem (AMBIEN) 5 MG tablet Take 5 mg by mouth at bedtime as needed. For sleep        Scheduled:    . amiodarone  100 mg Oral Daily  . aspirin EC  81 mg Oral Daily  . docusate sodium  100 mg Oral BID  . ferrous sulfate  325 mg Oral Q breakfast  . gabapentin  300 mg Oral Daily  . gabapentin  600 mg Oral QHS  . glipiZIDE  10 mg Oral Daily  . insulin aspart  0-15 Units Subcutaneous TID WC  . insulin glargine  14 Units Subcutaneous QHS  . metoprolol tartrate  50 mg Oral BID  . mulitivitamin with minerals  1 tablet Oral Daily  . multivitamin-lutein  2 capsule Oral Daily  . omega-3 acid ethyl esters  1 g Oral Daily  . PARoxetine  40 mg Oral Daily  . pneumococcal 23 valent vaccine  0.5 mL Intramuscular Tomorrow-1000  . potassium chloride SA  20 mEq Oral BID  . potassium chloride  40 mEq Oral Once  . simvastatin  10 mg Oral QHS  . warfarin  5 mg Oral NOW  . DISCONTD: aspirin   325 mg Oral Daily  . DISCONTD: fish oil-omega-3 fatty acids  1 g Oral Daily  . DISCONTD: gabapentin  300-600 mg Oral BID  . DISCONTD: multivitamin  1 tablet Oral Daily  . DISCONTD: PRESERVISION AREDS 2  1 capsule Oral Daily    Assessment: 78 y.o. F on warfarin resumed from PTA for hx Afib. INR still SUBtherapeutic and trending down despite dose last night (INR 1.26 << 1.35, goal of 2-3). Hgb/Hct/Plt stable, no s/sx of bleeding noted.   Goal of Therapy:  INR 2-3   Plan:  1. Warfarin 7.5 mg x 1 dose at 1800 today 2. Consider bridging while INR <2. 3.  Will continue to monitor for any signs/symptoms of bleeding and will follow up with PT/INR in the a.m.   Georgina Pillion, PharmD, BCPS Clinical Pharmacist Pager: [phone] 11/23/2011 3:19 PM

## 2011-11-23 NOTE — Progress Notes (Signed)
   CARE MANAGEMENT NOTE 11/23/2011  Patient:  [name], [name]   Account Number:  [id number]  Date Initiated:  11/23/2011  Documentation initiated by:  Donn Pierini  Subjective/Objective Assessment:   Pt admitted with confusion     Action/Plan:   PTA pt lived at home with daughter/son-law-  PT eval ordered   Anticipated DC Date:  11/25/2011   Anticipated DC Plan:  HOME W HOME HEALTH SERVICES      DC Planning Services  CM consult      [facility] Choice  Resumption Of Svcs/PTA Provider   Choice offered to / List presented to:          [facility] arranged  HH-2 PT  HH-3 OT      [facility] agency  [facility]   Status of service:  In process, will continue to follow Medicare Important Message given?   (If response is "NO", the following Medicare IM given date fields will be blank) Date Medicare IM given:   Date Additional Medicare IM given:    Discharge Disposition:    Per UR Regulation:    Comments:  PCP- Clent Ridges  11/23/11- 1245- Donn Pierini RN, BSN [phone] Spoke with pt and son at bedside, per conversation pt states she lives at home with her daughter- Blaine Hamper. Pt has active HH-PT/OT with [facility]. Called [facility] to confirm. DME at home includes RW, Cane, Tub chair, and BSC. Pt reports that her daughter picks up her medications and assist her in management of medications. PT eval recommending continued HH therapy. Will need orders to resume HH-PT/OT at discharge. CM to follow

## 2011-11-23 NOTE — Progress Notes (Signed)
Utilization review complete 

## 2011-11-23 NOTE — Progress Notes (Signed)
Pt admitted to floor on telemetry. Pt comes from home with daughter. She is alert oriented x3. Currently ordered bedrest, Pt able to assist with turning in bed but showing signs of weakness. States she amb. With walker or cane at home. Pt Has redness to sacrum, but area is blanchable. Pt has been oriented to unit, staff, and safety. Pt placed on bed alarm.

## 2011-11-23 NOTE — Progress Notes (Addendum)
Patient ID: [name], female   DOB: Dec 14, 1932, 78 y.o.   MRN: [id number] Subjective: No events overnight. Patient denies chest pain, shortness of breath, abdominal pain.   Objective:  Vital signs in last 24 hours:  Filed Vitals:   11/23/11 0858 11/23/11 1400 11/23/11 1700 11/23/11 2136  BP: 129/77 108/50 125/76 134/86  Pulse: 81 86 84 101  Temp: 98.5 F (36.9 C) 98 F (36.7 C) 98.5 F (36.9 C) 97.7 F (36.5 C)  TempSrc: Oral Oral Oral Oral  Resp: 18 18 18 21   Height:    5\' 2"  (1.575 m)  Weight:    118.1 kg (260 lb 5.8 oz)  SpO2: 95% 96% 93% 98%    Intake/Output from previous day:   Intake/Output Summary (Last 24 hours) at 11/23/11 2214 Last data filed at 11/23/11 1822  Gross per 24 hour  Intake    840 ml  Output      0 ml  Net    840 ml    Physical Exam: General: Alert, awake, oriented x3, in no acute distress. HEENT: No bruits, no goiter. Moist mucous membranes, no scleral icterus, no conjunctival pallor. Heart: Regular rate and rhythm, S1/S2 +, no murmurs, rubs, gallops. Lungs: Clear to auscultation bilaterally. No wheezing, no rhonchi, no rales.  Abdomen: Soft, nontender, nondistended, positive bowel sounds. Extremities: No clubbing or cyanosis, no pitting edema,  positive pedal pulses. Neuro: Grossly nonfocal.  Lab Results:  Basic Metabolic Panel:    Component Value Date/Time   NA 138 11/23/2011 0550   K 2.8* 11/23/2011 0550   CL 98 11/23/2011 0550   CO2 31 11/23/2011 0550   BUN 23 11/23/2011 0550   CREATININE 1.13* 11/23/2011 0550   GLUCOSE 195* 11/23/2011 0550   CALCIUM 9.3 11/23/2011 0550   CBC:    Component Value Date/Time   WBC 5.5 11/23/2011 0550   WBC 5.6 10/12/2011 1330   WBC 5.8 04/01/2011 1422   HGB 10.8* 11/23/2011 0550   HGB 12.3 10/12/2011 1330   HGB 11.1* 04/01/2011 1422   HCT 34.0* 11/23/2011 0550   HCT 35.9 10/12/2011 1330   HCT 33.6* 04/01/2011 1422   PLT 104* 11/23/2011 0550   PLT 122* 10/12/2011 1330   PLT 142* 04/01/2011 1422   MCV 94.7  11/23/2011 0550   MCV 90.0 10/12/2011 1330   MCV 89 04/01/2011 1422   NEUTROABS 3.1 11/23/2011 0550   NEUTROABS 3.8 10/12/2011 1330   NEUTROABS 3.6 04/01/2011 1422   LYMPHSABS 1.4 11/23/2011 0550   LYMPHSABS 1.0 10/12/2011 1330   LYMPHSABS 1.2 04/01/2011 1422   MONOABS 0.8 11/23/2011 0550   MONOABS 0.7 10/12/2011 1330   EOSABS 0.2 11/23/2011 0550   EOSABS 0.1 10/12/2011 1330   EOSABS 0.1 04/01/2011 1422   BASOSABS 0.0 11/23/2011 0550   BASOSABS 0.0 10/12/2011 1330   BASOSABS 0.1 04/01/2011 1422      Lab 11/23/11 0550 11/22/11 1116  WBC 5.5 5.1  HGB 10.8* 11.5*  HCT 34.0* 35.4*  PLT 104* 111*  MCV 94.7 94.1  MCH 30.1 30.6  MCHC 31.8 32.5  RDW 16.1* 16.1*  LYMPHSABS 1.4 --  MONOABS 0.8 --  EOSABS 0.2 --  BASOSABS 0.0 --  BANDABS -- --    Lab 11/23/11 0550 11/22/11 1116  NA 138 138  K 2.8* 3.1*  CL 98 97  CO2 31 30  GLUCOSE 195* 171*  BUN 23 25*  CREATININE 1.13* 1.17*  CALCIUM 9.3 9.6  MG 2.0 --    Lab 11/23/11  1428 11/22/11 2005  INR 1.26 1.35  PROTIME -- --   Cardiac markers: No results found for this basename: CK:3,CKMB:3,TROPONINI:3,MYOGLOBIN:3 in the last 168 hours No components found with this basename: POCBNP:3 No results found for this or any previous visit (from the past 240 hour(s)).  Studies/Results: Dg Chest 2 View  11/22/2011  *RADIOLOGY REPORT*  Clinical Data: Shortness of breath  CHEST - 2 VIEW  Comparison: 10/10/2008  Findings: Chronic interstitial markings.  No frank interstitial edema.  No pleural effusion or pneumothorax.  Stable cardiomegaly.  Valve replacement.  Degenerative changes of the visualized thoracolumbar spine.  Cholecystectomy clips.  IMPRESSION: Stable cardiomegaly with chronic interstitial markings.  No frank interstitial edema.  Original Report Authenticated By: Charline Bills, M.D.   Ct Head Wo Contrast  11/22/2011  *RADIOLOGY REPORT*  Clinical Data: Altered mental status, auditory hallucinations  CT HEAD WITHOUT CONTRAST  Technique:   Contiguous axial images were obtained from the base of the skull through the vertex without contrast.  Comparison: 04/06/2011  Findings: No evidence of parenchymal hemorrhage or extra-axial fluid collection. No mass lesion, mass effect, or midline shift.  No CT evidence of acute infarction.  Old left basal ganglia lacunar infarct. Subcortical white matter and periventricular small vessel ischemic changes.  Intracranial atherosclerosis.  Mild global cortical atrophy.  No ventriculomegaly.  The visualized paranasal sinuses are essentially clear. The mastoid air cells are unopacified.  No evidence of calvarial fracture.  IMPRESSION: No evidence of acute intracranial abnormality.  Atrophy with small vessel ischemic changes and intracranial atherosclerosis.  Original Report Authenticated By: Charline Bills, M.D.    Medications: Scheduled Meds:   . amiodarone  100 mg Oral Daily  . aspirin EC  81 mg Oral Daily  . docusate sodium  100 mg Oral BID  . ferrous sulfate  325 mg Oral Q breakfast  . gabapentin  300 mg Oral Daily  . gabapentin  600 mg Oral QHS  . glipiZIDE  10 mg Oral Daily  . insulin aspart  0-15 Units Subcutaneous TID WC  . insulin glargine  14 Units Subcutaneous QHS  . metoprolol tartrate  50 mg Oral BID  . mulitivitamin with minerals  1 tablet Oral Daily  . multivitamin-lutein  2 capsule Oral Daily  . omega-3 acid ethyl esters  1 g Oral Daily  . PARoxetine  40 mg Oral Daily  . pneumococcal 23 valent vaccine  0.5 mL Intramuscular Tomorrow-1000  . potassium chloride SA  20 mEq Oral BID  . potassium chloride  40 mEq Oral Once  . simvastatin  10 mg Oral QHS  . warfarin  5 mg Oral NOW  . warfarin  7.5 mg Oral ONCE-1800  . DISCONTD: aspirin  325 mg Oral Daily  . DISCONTD: fish oil-omega-3 fatty acids  1 g Oral Daily  . DISCONTD: gabapentin  300-600 mg Oral BID  . DISCONTD: multivitamin  1 tablet Oral Daily  . DISCONTD: PRESERVISION AREDS 2  1 capsule Oral Daily     Assessment/Plan:  Active Problems:  * Confusion - unclear etiology, perhaps due to underlying progressively worsening dementia; no signs of an active infection, patient afebrile with normal WBC - as per PT evaluation patient may be able to return to home with home health PT  Atrial fibrillation - continue coumadin as per pharmacy protocol    EDUCATION - test results and diagnostic studies were discussed with patient and pt's family who was present at the bedside - patient and family have verbalized the understanding -  questions were answered at the bedside and contact information was provided for additional questions or concerns   LOS: 1 day   Barret Esquivel 11/23/2011, 10:14 PM  [facility] Pager: [phone]

## 2011-11-24 LAB — BASIC METABOLIC PANEL
CO2: 28 mEq/L (ref 19–32)
Calcium: 9.4 mg/dL (ref 8.4–10.5)
Chloride: 102 mEq/L (ref 96–112)
Creatinine, Ser: 1 mg/dL (ref 0.50–1.10)
Glucose, Bld: 180 mg/dL — ABNORMAL HIGH (ref 70–99)

## 2011-11-24 LAB — PROTIME-INR: Prothrombin Time: 16.9 seconds — ABNORMAL HIGH (ref 11.6–15.2)

## 2011-11-24 LAB — CBC
HCT: 37.5 % (ref 36.0–46.0)
Hemoglobin: 11.7 g/dL — ABNORMAL LOW (ref 12.0–15.0)
MCH: 29.8 pg (ref 26.0–34.0)
MCV: 95.4 fL (ref 78.0–100.0)
RBC: 3.93 MIL/uL (ref 3.87–5.11)
WBC: 6.9 10*3/uL (ref 4.0–10.5)

## 2011-11-24 LAB — GLUCOSE, CAPILLARY
Glucose-Capillary: 163 mg/dL — ABNORMAL HIGH (ref 70–99)
Glucose-Capillary: 103 mg/dL — ABNORMAL HIGH (ref 70–99)

## 2011-11-24 MED ORDER — DONEPEZIL HCL 10 MG PO TABS
10.0000 mg | ORAL_TABLET | Freq: Every day | ORAL | Status: DC
  Administered 2011-11-24: 10 mg via ORAL
  Filled 2011-11-24 (×2): qty 1

## 2011-11-24 MED ORDER — WARFARIN - PHARMACIST DOSING INPATIENT
Freq: Every day | Status: DC
  Filled 2011-11-24 (×2): qty 1

## 2011-11-24 MED ORDER — WARFARIN SODIUM 7.5 MG PO TABS
7.5000 mg | ORAL_TABLET | Freq: Once | ORAL | Status: AC
  Administered 2011-11-24: 7.5 mg via ORAL
  Filled 2011-11-24: qty 1

## 2011-11-24 MED ORDER — ONDANSETRON HCL 4 MG/2ML IJ SOLN: 4.0000 mg | Freq: Four times a day (QID) | INTRAMUSCULAR | Status: DC | PRN

## 2011-11-24 MED ORDER — DIPHENHYDRAMINE HCL 25 MG PO CAPS: 25.0000 mg | ORAL_CAPSULE | Freq: Once | ORAL | Status: DC

## 2011-11-24 NOTE — Progress Notes (Signed)
Dr. Gonzella Lex visited patient, no new orders placed at this time. Pt states she feels much better. Resting in chair. Will cont to monitor.

## 2011-11-24 NOTE — Clinical Documentation Improvement (Signed)
BMI DOCUMENTATION CLARIFICATION QUERY  THIS DOCUMENT IS NOT A PERMANENT PART OF THE MEDICAL RECORD  TO RESPOND TO THE THIS QUERY, FOLLOW THE INSTRUCTIONS BELOW:  1. If needed, update documentation for the patient's encounter via the notes activity.  2. Access this query again and click edit on the In [employer].  3. After updating, or not, click F2 to complete all highlighted (required) fields concerning your review. Select "additional documentation in the medical record" OR "no additional documentation provided".  4. Click Sign note button.  5. The deficiency will fall out of your In Basket *Please let us know if you are not able to complete this workflow by phone or e-mail (listed below).         11/24/11  Dear Dr. Gonzella Lex Marton Redwood  In an effort to better capture your patient's severity of illness, reflect appropriate length of stay and utilization of resources, a review of the patient medical record has revealed the following indicators.    Based on your clinical judgment, please clarify and document in a progress note and/or discharge summary the clinical condition associated with the following supporting information:  In responding to this query please exercise your independent judgment.  The fact that a query is asked, does not imply that any particular answer is desired or expected.  Possible Clinical conditions  Morbid Obesity W/ BMI=  47.7  Underweight w/BMI=  Other condition___________________  Cannot Clinically determine _____________  Risk Factors: Sign & Symptoms:(As per notes)" Sleep Apnea", "SOB" Weight: 260 LBS Height 5'2" BMI= 47.7   Reviewed:  no additional documentation provided  Thank You,  Joanette Gula Delk RN,BSN Clinical Documentation Specialist: [phone] Pager  Health Information Management 

## 2011-11-24 NOTE — Clinical Documentation Improvement (Signed)
RENAL FAILURE DOCUMENTATION CLARIFICATION QUERY  THIS DOCUMENT IS NOT A PERMANENT PART OF THE MEDICAL RECORD  TO RESPOND TO THE THIS QUERY, FOLLOW THE INSTRUCTIONS BELOW:  1. If needed, update documentation for the patient's encounter via the notes activity.  2. Access this query again and click edit on the In [employer].  3. After updating, or not, click F2 to complete all highlighted (required) fields concerning your review. Select "additional documentation in the medical record" OR "no additional documentation provided".  4. Click Sign note button.  5. The deficiency will fall out of your In Basket *Please let us know if you are not able to complete this workflow by phone or e-mail (listed below).  Please update your documentation within the medical record to reflect your response to this query.                                                                                    11/24/11  Dear Dr. Gonzella Lex Marton Redwood,  In a better effort to capture your patient's severity of illness, reflect appropriate length of stay and utilization of resources, a review of the patient medical record has revealed the following indicators.    Please clarify the below (if your clinical findings/judgment agree) as you document the patient's condition(s) in the progress note and discharge summary. Thank you!  In responding to this query please exercise your independent judgment.  The fact that a query is asked, does not imply that any particular answer is desired or expected.  Possible Clinical Conditions?   _______Acute Kidney Injury _______Acute on Chronic Renal Failure _______Acute Renal Failure _______Acute Tubular Necrosis _______Acute Renal Cortical Necrosis _______Acute Renal Medullary Necrosis _______Chronic Renal Failure _______Other Condition_____________ _______Cannot Clinically Determine    Supporting Information:  Risk Factors: (As per notes) "Chronic renal failure", "#3 acute  on chronic kidney disease"  Diagnostics: Lab: BUN =25*  CREATININE =1.17*    Treatments: (As per notes) "#2 gentle IV hydration with normal saline"                   You may use possible, probable, or suspect with inpatient documentation. possible, probable, suspected diagnoses MUST be documented at the time of discharge  Reviewed:  no additional documentation provided  Thank You,  Joanette Gula Delk RN,BSN  Clinical Documentation Specialist: [phone] Pager Health Information Management [place]

## 2011-11-24 NOTE — Progress Notes (Signed)
Assigned to patient for the night. Pt alert, Oriented, no hallucinations but does have disorganized thinking at times.   Speaking Spoke with daughter and patient and they informed me of patient not being able to sleep for a few days and usually takes Ambien at night. She was given Ambien last night. I observed patient talking in her sleep and at one time feet half out the bed. When speaking with patient at that time she still seemed to be sleeping. I also noticed patient being restless in bed at times and showing some trouble breathing, she has sleep apnea but does not want to wear CPAP. Spoke with Daughter this am to see if this was normal behavior for patient and night and she explained that the talking in sleep and restlessness does happen at times. Pt oxygen level was spot checked at night when pt was moving around in bed and she took off her oxygen via [place], sats were in low 80s, but increased when [place] placed back on and aroused. Pt  Continued to be monitored. Pt easily aroused when sleeping. Spoke with daughter in regards to not giving Ambien at night and using alternative to assist with sleep, for pt's safety.

## 2011-11-24 NOTE — Progress Notes (Signed)
Physical Therapy Treatment Patient Details Name: [name] MRN: [id number] DOB: [birth year] Today's Date: 11/24/2011  PT Assessment/Plan  PT - Assessment/Plan Comments on Treatment Session: Pt very sleepy apon arrival. Once aroused patient able to participate in therapy however if patient not actively participating in therapy she fell asleep quickly.  PT Plan: Discharge plan remains appropriate PT Frequency: Min 3X/week Follow Up Recommendations: Home health PT Equipment Recommended: None recommended by PT PT Goals  Acute Rehab PT Goals PT Goal: Sit to Stand - Progress: Progressing toward goal PT Goal: Stand to Sit - Progress: Progressing toward goal PT Goal: Ambulate - Progress: Not progressing  PT Treatment Precautions/Restrictions  Precautions Precautions: Fall Restrictions Weight Bearing Restrictions: Yes Mobility (including Balance) Bed Mobility Supine to Sit: 6: Modified independent (Device/Increase time) Sitting - Scoot to Edge of Bed: 6: Modified independent (Device/Increase time) Transfers Sit to Stand: 4: Min assist;From bed;With upper extremity assist Sit to Stand Details (indicate cue type and reason): A to ensure balance. Cues for safety Stand to Sit: 4: Min assist;With upper extremity assist;With armrests;To chair/3-in-1 Stand to Sit Details: A to control descent into chair. Cues for safety and hand placement Stand Pivot Transfers: 4: Min assist Stand Pivot Transfer Details (indicate cue type and reason): +1 assist for safety as patient very sleepy. Cues for safety and safe positioning. Pt stated while transferring, "I think I had one too many drinks last night" Ambulation/Gait Ambulation/Gait: No    Exercise    End of Session PT - End of Session Equipment Utilized During Treatment: Gait belt Activity Tolerance: Patient limited by fatigue;Other (comment) (Pt aroused when moving but one still patient sleeping. ) Patient left: in chair;with call bell in  reach Nurse Communication: Mobility status for transfers General Behavior During Session: Lethargic (if not actively moving. ) Cognition: [facility] for tasks performed  Fredrich Birks 11/24/2011, 11:48 AM 11/24/2011 Fredrich Birks PTA [phone] pager [phone] office

## 2011-11-24 NOTE — Clinical Documentation Improvement (Signed)
CHANGE MENTAL STATUS DOCUMENTATION CLARIFICATION   THIS DOCUMENT IS NOT A PERMANENT PART OF THE MEDICAL RECORD  TO RESPOND TO THE THIS QUERY, FOLLOW THE INSTRUCTIONS BELOW:  1. If needed, update documentation for the patient's encounter via the notes activity.  2. Access this query again and click edit on the In [employer].  3. After updating, or not, click F2 to complete all highlighted (required) fields concerning your review. Select "additional documentation in the medical record" OR "no additional documentation provided".  4. Click Sign note button.  5. The deficiency will fall out of your In Basket *Please let us know if you are not able to complete this workflow by phone or e-mail (listed below).         11/24/11  Dear Dr. Dhungel,/Associates,  In an effort to better capture your patient's severity of illness, reflect appropriate length of stay and utilization of resources, a review of the patient medical record has revealed the following indicators.    Please CLARIFY  the below (if your clinical findings/judgment agree) as you document the patient's condition(s) in the progress note and discharge summary. Thank you!  In responding to this query please exercise your independent judgment.  The fact that a query is asked, does not imply that any particular answer is desired or expected.  Possible Clinical Conditions?  _______Encephalopathy (describe type if known)                       Anoxic                       Septic                       Alcoholic                        Hepatic                       Hypertensive                       Metabolic                       _______ Toxic  _______Drug induced confusion/delirium _______Acute Psychosis _______Acute delirium _______Acute exacerbation of known dementia (indicate type) _______New diagnosis of Dementia, Alzheimer's, cerebral atherosclerosis _______Other Condition__________________ _______Cannot Clinically  Determine   Supporting Information:  Signs & Symptoms: (As per notes) "#1 auditory and visual hallucinations-no suicidal or homicidal ideations."  "Confusion - unclear etiology, perhaps due to underlying progressively worsening dementia; no signs of an active infection, patient afebrile with normal WBC"  Diagnostics: Lab:Lab  11/23/11 0550                             11/22/11 1116  NA 138                                          138  K=2.8*                                           3.1*  CL=98  97  CO2 =31                                       30  GLUCOSE= 195*                         171*  BUN =23                                        25*  CREATININE =1.13*                     1.17*  CALCIUM= 9.3                               9.6  MG =2.0    Radiology: 11-22-11 CT HEAD IMPRESSION: No evidence of acute intracranial abnormality. Atrophy with small vessel ischemic changes and intracranial atherosclerosis. Original Report Authenticated By: Charline Bills, M.D.  Treatment: ARICEPT 10 mg po daily, NEURONTIN 600 mg po daily, PAXIL 40 MG po daily, KLONOPIN 0.5 mg BID,  AMBIEN 5 mg hs  prn   Reviewed:  no additional documentation provided  Thank Bonita Quin  Joanette Gula Delk RN,BSN Clinical Documentation Specialist: [phone] Pager Health Information Management [place]

## 2011-11-24 NOTE — Progress Notes (Signed)
Pt found dry heaving and vomiting scant amount of yellowish emesis. HR slightly elevated. MD, Dr. Gonzella Lex, text paged for antiemetic. Awaiting call back. Dondra Spry

## 2011-11-24 NOTE — Progress Notes (Signed)
ANTICOAGULATION CONSULT NOTE - Follow Up Consult  Pharmacy Consult for Warfarin Indication: atrial fibrillation  Allergies  Allergen Reactions  . Celecoxib     Patient Measurements: Height: 5\' 2"  (157.5 cm) Weight: 260 lb 5.8 oz (118.1 kg) IBW/kg (Calculated) : 50.1    Vital Signs: Temp: 97.6 F (36.4 C) (02/27 0411) Temp src: Oral (02/27 0411) BP: 131/57 mmHg (02/27 0411) Pulse Rate: 73  (02/27 0411)  Labs:  Basename 11/24/11 0605 11/23/11 1428 11/23/11 0550 11/22/11 2005 11/22/11 1116  HGB 11.7* -- 10.8* -- --  HCT 37.5 -- 34.0* -- 35.4*  PLT 119* -- 104* -- 111*  APTT -- -- -- -- --  LABPROT 16.9* 16.1* -- 16.9* --  INR 1.35 1.26 -- 1.35 --  HEPARINUNFRC -- -- -- -- --  CREATININE 1.00 -- 1.13* -- 1.17*  CKTOTAL -- -- -- -- --  CKMB -- -- -- -- --  TROPONINI -- -- -- -- --   Estimated Creatinine Clearance: 58.4 ml/min (by C-G formula based on Cr of 1).   Medications:  Prescriptions prior to admission  Medication Sig Dispense Refill  . acetaminophen (TYLENOL) 325 MG tablet Take 650 mg by mouth every 6 (six) hours as needed. For pain      . amiodarone (PACERONE) 200 MG tablet Take 100 mg by mouth daily.      Marland Kitchen aspirin EC 81 MG tablet Take 81 mg by mouth daily.      Marland Kitchen docusate sodium (COLACE) 100 MG capsule Take 100 mg by mouth 2 (two) times daily.       Marland Kitchen donepezil (ARICEPT) 10 MG tablet Take 10 mg by mouth at bedtime as needed.        . ferrous sulfate (GNP IRON) 325 (65 FE) MG tablet Take 325 mg by mouth daily with breakfast.        . fish oil-omega-3 fatty acids 1000 MG capsule Take 1 g by mouth daily.       . furosemide (LASIX) 40 MG tablet Take 80 mg by mouth 2 (two) times daily.       Marland Kitchen gabapentin (NEURONTIN) 600 MG tablet Take 300-600 mg by mouth 2 (two) times daily. Take 1/2 tablet AM and 1 tablet at bedtime      . glucose blood test strip 1 each by Other route as needed. One touch test strips.  Use as instructed      . insulin glargine (LANTUS) 100  UNIT/ML injection Inject 14 Units into the skin at bedtime. Increasing by 2 units every 3 days until cbg gets to 120.  Increased to 14 units on 11/20/2011.      Marland Kitchen Lancet Device MISC 1 Device by Does not apply route daily as needed. Lancet thin finger sticks      . methocarbamol (ROBAXIN) 500 MG tablet Take 500 mg by mouth 2 (two) times daily as needed. For muscle pain      . metolazone (ZAROXOLYN) 2.5 MG tablet Take 2.5 mg by mouth once a week. On monday      . metoprolol tartrate (LOPRESSOR) 25 MG tablet Take 12.5 mg by mouth 2 (two) times daily.      . Multiple Vitamin (MULTIVITAMIN) tablet Take 1 tablet by mouth daily.        . Multiple Vitamins-Minerals (PRESERVISION AREDS 2) CAPS Take 1 capsule by mouth daily.      Marland Kitchen PARoxetine (PAXIL) 40 MG tablet Take 40 mg by mouth every morning.        Marland Kitchen  polyethylene glycol (MIRALAX / GLYCOLAX) packet Take 17 g by mouth daily as needed. For constipation      . potassium chloride SA (K-DUR,KLOR-CON) 20 MEQ tablet Take 20 mEq by mouth 2 (two) times daily.       . simvastatin (ZOCOR) 10 MG tablet Take 10 mg by mouth at bedtime.        Marland Kitchen zolpidem (AMBIEN) 5 MG tablet Take 5 mg by mouth at bedtime as needed. For sleep        Scheduled:     . amiodarone  100 mg Oral Daily  . aspirin EC  81 mg Oral Daily  . docusate sodium  100 mg Oral BID  . ferrous sulfate  325 mg Oral Q breakfast  . gabapentin  300 mg Oral Daily  . gabapentin  600 mg Oral QHS  . glipiZIDE  10 mg Oral Daily  . insulin aspart  0-15 Units Subcutaneous TID WC  . insulin glargine  14 Units Subcutaneous QHS  . metoprolol tartrate  50 mg Oral BID  . mulitivitamin with minerals  1 tablet Oral Daily  . multivitamin-lutein  2 capsule Oral Daily  . omega-3 acid ethyl esters  1 g Oral Daily  . PARoxetine  40 mg Oral Daily  . pneumococcal 23 valent vaccine  0.5 mL Intramuscular Tomorrow-1000  . potassium chloride SA  20 mEq Oral BID  . potassium chloride  40 mEq Oral Once  . simvastatin  10  mg Oral QHS  . warfarin  7.5 mg Oral ONCE-1800  . DISCONTD: aspirin  325 mg Oral Daily    Assessment: 78 y.o. F on warfarin resumed from PTA for hx Afib. INR still SUBtherapeutic however now trending up (INR 1.35 << 1.26, goal of 2-3). Hgb/Hct/Plt stable, no s/sx of bleeding noted.   Goal of Therapy:  INR 2-3   Plan:  1. Repeat warfarin 7.5 mg x 1 dose at 1800 today 2. Consider bridging while INR <2. 3.  Will continue to monitor for any signs/symptoms of bleeding and will follow up with PT/INR in the a.m.   Georgina Pillion, PharmD, BCPS Clinical Pharmacist Pager: [phone] 11/24/2011 9:30 AM

## 2011-11-24 NOTE — Progress Notes (Signed)
Pt is currently active with [facility] program for long term disease management services as a benefit of [employer]. Pt has an aide that comes in the home twice a week, 4 hours each day. Pt's d/c plan discussed with her daughter Blaine Hamper. It was explained that pt may need to have the hours of the aide increased to more days of the week for her safety. Per the community case manager, assisted living is not an option for the pt, as the family wants to keep her at home. RN case manager will do a post d/c transition of care call and continue home visits for assessments of safety issues, IDDM, CHF for HTN, CAD and other needs. Brooke Bonito C. Shakendra Griffeth, RN, MS, CCM [facility] [phone]

## 2011-11-24 NOTE — Progress Notes (Signed)
Just reviewed pt orders and meds with pts daugther, noted that Aricept was placed as prn order, which should be daily. Will Clarify order with MD tonight. Last dose taken was Sunday night

## 2011-11-24 NOTE — Progress Notes (Signed)
Subjective: Patient seen and examined this am. Feels nauseous with dry heaves. Appears quite alert but dose dose off during conversations.   Objective:  Vital signs in last 24 hours:  Filed Vitals:   11/24/11 1006 11/24/11 1245 11/24/11 1257 11/24/11 1745  BP: 133/72 123/84 151/91 123/75  Pulse: 95 88 113 83  Temp:  97.8 F (36.6 C) 97.5 F (36.4 C) 97.5 F (36.4 C)  TempSrc:  Oral Oral Oral  Resp:  20 15 20   Height:      Weight:      SpO2:  95% 98% 99%    Intake/Output from previous day:   Intake/Output Summary (Last 24 hours) at 11/24/11 1821 Last data filed at 11/24/11 1616  Gross per 24 hour  Intake    600 ml  Output    150 ml  Net    450 ml    Physical Exam:  General: elderly female in no acute distress. HEENT: no pallor, no icterus, moist oral mucosa, no JVD, no lymphadenopathy Heart: Normal  s1 &s2  Regular rate and rhythm, without murmurs, rubs, gallops. Lungs: Clear to auscultation bilaterally. Abdomen: Soft, nontender, nondistended, positive bowel sounds. Extremities: No clubbing cyanosis or edema with positive pedal pulses. Neuro: Alert, awake, oriented x3, doses off during conversations, , does show some flapping tremors, no focal    Lab Results:  Basic Metabolic Panel:    Component Value Date/Time   NA 140 11/24/2011 0605   K 3.3* 11/24/2011 0605   CL 102 11/24/2011 0605   CO2 28 11/24/2011 0605   BUN 20 11/24/2011 0605   CREATININE 1.00 11/24/2011 0605   GLUCOSE 180* 11/24/2011 0605   CALCIUM 9.4 11/24/2011 0605   CBC:    Component Value Date/Time   WBC 6.9 11/24/2011 0605   WBC 5.6 10/12/2011 1330   WBC 5.8 04/01/2011 1422   HGB 11.7* 11/24/2011 0605   HGB 12.3 10/12/2011 1330   HGB 11.1* 04/01/2011 1422   HCT 37.5 11/24/2011 0605   HCT 35.9 10/12/2011 1330   HCT 33.6* 04/01/2011 1422   PLT 119* 11/24/2011 0605   PLT 122* 10/12/2011 1330   PLT 142* 04/01/2011 1422   MCV 95.4 11/24/2011 0605   MCV 90.0 10/12/2011 1330   MCV 89 04/01/2011 1422   NEUTROABS  3.1 11/23/2011 0550   NEUTROABS 3.8 10/12/2011 1330   NEUTROABS 3.6 04/01/2011 1422   LYMPHSABS 1.4 11/23/2011 0550   LYMPHSABS 1.0 10/12/2011 1330   LYMPHSABS 1.2 04/01/2011 1422   MONOABS 0.8 11/23/2011 0550   MONOABS 0.7 10/12/2011 1330   EOSABS 0.2 11/23/2011 0550   EOSABS 0.1 10/12/2011 1330   EOSABS 0.1 04/01/2011 1422   BASOSABS 0.0 11/23/2011 0550   BASOSABS 0.0 10/12/2011 1330   BASOSABS 0.1 04/01/2011 1422    No results found for this or any previous visit (from the past 240 hour(s)).  Studies/Results: No results found.  Medications: Scheduled Meds:   . amiodarone  100 mg Oral Daily  . aspirin EC  81 mg Oral Daily  . docusate sodium  100 mg Oral BID  . ferrous sulfate  325 mg Oral Q breakfast  . gabapentin  300 mg Oral Daily  . gabapentin  600 mg Oral QHS  . glipiZIDE  10 mg Oral Daily  . insulin aspart  0-15 Units Subcutaneous TID WC  . insulin glargine  14 Units Subcutaneous QHS  . metoprolol tartrate  50 mg Oral BID  . mulitivitamin with minerals  1 tablet Oral Daily  .  multivitamin-lutein  2 capsule Oral Daily  . omega-3 acid ethyl esters  1 g Oral Daily  . PARoxetine  40 mg Oral Daily  . pneumococcal 23 valent vaccine  0.5 mL Intramuscular Tomorrow-1000  . potassium chloride SA  20 mEq Oral BID  . simvastatin  10 mg Oral QHS  . warfarin  7.5 mg Oral ONCE-1800  . Warfarin - Pharmacist Dosing Inpatient   Does not apply q1800   Continuous Infusions:   . sodium chloride 100 mL/hr at 11/24/11 0432  . sodium chloride 50 mL/hr at 11/22/11 2228   PRN Meds:.acetaminophen, clonazePAM, donepezil, HYDROcodone-acetaminophen, methocarbamol, polyethylene glycol, zolpidem  Assessment/    78 y/o obese female with hx of CAD s/p CABG, hx of CHF, DM , sleep apnea,dementia , CKD presented with delirium and ? Hearing voices.   Plan:  * Confusion  - unclear etiology, Possible  for delirium with underlying dementia that could be worsening Head CT unremarkable except chr ischemic  changes Is more oriented to conversation today ( as compared to documentation earlier) but does have mild flapping tremors  will check ammonia levels She doses off during conversation and possibly could be due to severe sleep apnea, does not use CPAP at home.  -PT following No signs of infection Cont aricept Will reduce dose of neurontin   Atrial fibrillation  Rate controlled - continue coumadin as per pharmacy protocol  - cont amiodarone   DM  cont lantus and SSI   CAD  stable Cont ASA, statin and BB  Hypokalemia  replenishing   OSA Refuses to cont CPAP  DVT prophylaxis     LOS: 2 days   Solan Vosler 11/24/2011, 6:21 PM

## 2011-11-25 ENCOUNTER — Telehealth: Payer: Self-pay | Admitting: Hematology & Oncology

## 2011-11-25 DIAGNOSIS — F05 Delirium due to known physiological condition: Secondary | ICD-10-CM | POA: Diagnosis present

## 2011-11-25 LAB — GLUCOSE, CAPILLARY
Glucose-Capillary: 82 mg/dL (ref 70–99)
Glucose-Capillary: 141 mg/dL — ABNORMAL HIGH (ref 70–99)
Glucose-Capillary: 169 mg/dL — ABNORMAL HIGH (ref 70–99)
Glucose-Capillary: 135 mg/dL — ABNORMAL HIGH (ref 70–99)

## 2011-11-25 LAB — CBC
HCT: 35.9 % — ABNORMAL LOW (ref 36.0–46.0)
MCHC: 31.5 g/dL (ref 30.0–36.0)
MCV: 95.5 fL (ref 78.0–100.0)
Platelets: 117 10*3/uL — ABNORMAL LOW (ref 150–400)
RDW: 16.2 % — ABNORMAL HIGH (ref 11.5–15.5)
WBC: 5.9 10*3/uL (ref 4.0–10.5)

## 2011-11-25 LAB — BASIC METABOLIC PANEL
BUN: 20 mg/dL (ref 6–23)
Creatinine, Ser: 1.12 mg/dL — ABNORMAL HIGH (ref 0.50–1.10)
GFR calc Af Amer: 54 mL/min — ABNORMAL LOW (ref >90)
GFR calc non Af Amer: 46 mL/min — ABNORMAL LOW (ref >90)

## 2011-11-25 LAB — PROTIME-INR: Prothrombin Time: 17.6 seconds — ABNORMAL HIGH (ref 11.6–15.2)

## 2011-11-25 MED ORDER — LISINOPRIL 2.5 MG PO TABS: 2.5000 mg | ORAL_TABLET | Freq: Every day | ORAL | Status: DC

## 2011-11-25 MED ORDER — GABAPENTIN 600 MG PO TABS: 300.0000 mg | ORAL_TABLET | Freq: Two times a day (BID) | ORAL | Status: DC

## 2011-11-25 NOTE — Progress Notes (Addendum)
ANTICOAGULATION CONSULT NOTE - Follow Up Consult  Pharmacy Consult for Warfarin Indication: atrial fibrillation  Allergies  Allergen Reactions  . Celecoxib     Patient Measurements: Height: 5\' 2"  (157.5 cm) Weight: 261 lb 12.8 oz (118.752 kg) IBW/kg (Calculated) : 50.1    Vital Signs: Temp: 98 F (36.7 C) (02/28 1245) Temp src: Oral (02/28 1245) BP: 141/101 mmHg (02/28 1245) Pulse Rate: 100  (02/28 1245)  Labs:  Basename 11/25/11 0645 11/24/11 0605 11/23/11 1428 11/23/11 0550  HGB 11.3* 11.7* -- --  HCT 35.9* 37.5 -- 34.0*  PLT 117* 119* -- 104*  APTT -- -- -- --  LABPROT 17.6* 16.9* 16.1* --  INR 1.42 1.35 1.26 --  HEPARINUNFRC -- -- -- --  CREATININE 1.12* 1.00 -- 1.13*  CKTOTAL -- -- -- --  CKMB -- -- -- --  TROPONINI -- -- -- --   Estimated Creatinine Clearance: 52.3 ml/min (by C-G formula based on Cr of 1.12).   Medications:  Prescriptions prior to admission  Medication Sig Dispense Refill  . acetaminophen (TYLENOL) 325 MG tablet Take 650 mg by mouth every 6 (six) hours as needed. For pain      . amiodarone (PACERONE) 200 MG tablet Take 100 mg by mouth daily.      Marland Kitchen aspirin EC 81 MG tablet Take 81 mg by mouth daily.      Marland Kitchen docusate sodium (COLACE) 100 MG capsule Take 100 mg by mouth 2 (two) times daily.       Marland Kitchen donepezil (ARICEPT) 10 MG tablet Take 10 mg by mouth at bedtime as needed.        . ferrous sulfate (GNP IRON) 325 (65 FE) MG tablet Take 325 mg by mouth daily with breakfast.        . fish oil-omega-3 fatty acids 1000 MG capsule Take 1 g by mouth daily.       . furosemide (LASIX) 40 MG tablet Take 80 mg by mouth 2 (two) times daily.       Marland Kitchen glucose blood test strip 1 each by Other route as needed. One touch test strips.  Use as instructed      . insulin glargine (LANTUS) 100 UNIT/ML injection Inject 14 Units into the skin at bedtime. Increasing by 2 units every 3 days until cbg gets to 120.  Increased to 14 units on 11/20/2011.      Marland Kitchen Lancet Device  MISC 1 Device by Does not apply route daily as needed. Lancet thin finger sticks      . metolazone (ZAROXOLYN) 2.5 MG tablet Take 2.5 mg by mouth once a week. On monday      . metoprolol tartrate (LOPRESSOR) 25 MG tablet Take 12.5 mg by mouth 2 (two) times daily.      . Multiple Vitamin (MULTIVITAMIN) tablet Take 1 tablet by mouth daily.        Marland Kitchen PARoxetine (PAXIL) 40 MG tablet Take 40 mg by mouth every morning.        . polyethylene glycol (MIRALAX / GLYCOLAX) packet Take 17 g by mouth daily as needed. For constipation      . potassium chloride SA (K-DUR,KLOR-CON) 20 MEQ tablet Take 20 mEq by mouth 2 (two) times daily.       . simvastatin (ZOCOR) 10 MG tablet Take 10 mg by mouth at bedtime.        Marland Kitchen DISCONTD: gabapentin (NEURONTIN) 600 MG tablet Take 300-600 mg by mouth 2 (two) times daily. Take 1/2 tablet  AM and 1 tablet at bedtime      . DISCONTD: methocarbamol (ROBAXIN) 500 MG tablet Take 500 mg by mouth 2 (two) times daily as needed. For muscle pain      . DISCONTD: Multiple Vitamins-Minerals (PRESERVISION AREDS 2) CAPS Take 1 capsule by mouth daily.      Marland Kitchen DISCONTD: zolpidem (AMBIEN) 5 MG tablet Take 5 mg by mouth at bedtime as needed. For sleep        Scheduled:     . amiodarone  100 mg Oral Daily  . aspirin EC  81 mg Oral Daily  . diphenhydrAMINE  25 mg Oral Once  . docusate sodium  100 mg Oral BID  . donepezil  10 mg Oral QHS  . ferrous sulfate  325 mg Oral Q breakfast  . gabapentin  600 mg Oral QHS  . glipiZIDE  10 mg Oral Daily  . insulin aspart  0-15 Units Subcutaneous TID WC  . insulin glargine  14 Units Subcutaneous QHS  . metoprolol tartrate  50 mg Oral BID  . mulitivitamin with minerals  1 tablet Oral Daily  . multivitamin-lutein  2 capsule Oral Daily  . omega-3 acid ethyl esters  1 g Oral Daily  . PARoxetine  40 mg Oral Daily  . potassium chloride SA  20 mEq Oral BID  . simvastatin  10 mg Oral QHS  . warfarin  7.5 mg Oral ONCE-1800  . Warfarin - Pharmacist Dosing  Inpatient   Does not apply q1800  . DISCONTD: gabapentin  300 mg Oral Daily    Assessment: 78 y.o. F on warfarin resumed from PTA for hx Afib. INR SUBtherapeutic this a.m. However trending up (INR 1.42 << 1.35, goal of 2-3). Hgb/Hct/Plt stable, no s/sx of bleeding noted.   Goal of Therapy:  INR 2-3   Plan:  1. Warfarin 7.5 mg x 1 dose at 1800 today 2. Consider bridging while INR <2. 3.  Will continue to monitor for any signs/symptoms of bleeding and will follow up with PT/INR in the a.m.   Georgina Pillion, PharmD, BCPS Clinical Pharmacist Pager: [phone] 11/25/2011 2:48 PM     Addendum: Noted discharge summary and plans to stop warfarin as patient being followed by cardiologist and has been off for ~6 months now. Will not enter dose for today.

## 2011-11-25 NOTE — Telephone Encounter (Signed)
Pt canceled 3-1 is in the hospital will call to reschedule

## 2011-11-25 NOTE — Discharge Summary (Signed)
Patient ID: [name] MRN: [id number] DOB/AGE: [birth year] 78 y.o.  Admit date: 11/22/2011 Discharge date: 11/25/2011  Primary Care Physician:  [name], MD, MD  Discharge Diagnoses:     Principal Problem:  *Delirium due to general medical condition  Active Problems:  ANEMIA OF RENAL FAILURE  ALZHEIMERS DISEASE  DIABETIC PERIPHERAL NEUROPATHY  HYPERLIPIDEMIA  MAJOR DEPRESSIVE DISORDER RECURRENT EPISODE MILD  SLEEP APNEA, OBSTRUCTIVE, MILD  HYPERTENSION, CONTROLLED  ATHEROSCLEROTIC CARDIOVASCULAR DISEASE  AORTIC VALVE REPLACEMENT, HX OF Hx of  CHF (congestive heart failure)   Medication List  As of 11/25/2011  1:39 PM   STOP taking these medications         clonazePAM 0.5 MG tablet      glipiZIDE 10 MG 24 hr tablet      HYDROcodone-acetaminophen 10-325 MG per tablet      methocarbamol 500 MG tablet      PRESERVISION AREDS 2 Caps      warfarin 5 MG tablet      zolpidem 5 MG tablet         TAKE these medications         acetaminophen 325 MG tablet   Commonly known as: TYLENOL   Take 650 mg by mouth every 6 (six) hours as needed. For pain      amiodarone 200 MG tablet   Commonly known as: PACERONE   Take 100 mg by mouth daily.      ARICEPT 10 MG tablet   Generic drug: donepezil   Take 10 mg by mouth at bedtime as needed.      aspirin EC 81 MG tablet   Take 81 mg by mouth daily.      docusate sodium 100 MG capsule   Commonly known as: COLACE   Take 100 mg by mouth 2 (two) times daily.      fish oil-omega-3 fatty acids 1000 MG capsule   Take 1 g by mouth daily.      furosemide 40 MG tablet   Commonly known as: LASIX   Take 80 mg by mouth 2 (two) times daily.      gabapentin 600 MG tablet   Commonly known as: NEURONTIN   Take 0.5 tablets (300 mg total) by mouth 2 (two) times daily. Take 1/2 tablet AM and 1 tablet at bedtime      glucose blood test strip   1 each by Other route as needed. One touch test strips.  Use as instructed     GNP IRON 325 (65 FE) MG tablet   Generic drug: ferrous sulfate   Take 325 mg by mouth daily with breakfast.      insulin glargine 100 UNIT/ML injection   Commonly known as: LANTUS   Inject 14 Units into the skin at bedtime. Increasing by 2 units every 3 days until cbg gets to 120.  Increased to 14 units on 11/20/2011.      Lancet Device Misc   1 Device by Does not apply route daily as needed. Lancet thin finger sticks      lisinopril 2.5 MG tablet   Commonly known as: PRINIVIL,ZESTRIL   Take 1 tablet (2.5 mg total) by mouth daily.      metolazone 2.5 MG tablet   Commonly known as: ZAROXOLYN   Take 2.5 mg by mouth once a week. On monday      metoprolol tartrate 25 MG tablet   Commonly known as: LOPRESSOR   Take 12.5 mg by mouth 2 (two) times daily.  multivitamin tablet   Take 1 tablet by mouth daily.      PARoxetine 40 MG tablet   Commonly known as: PAXIL   Take 40 mg by mouth every morning.      polyethylene glycol packet   Commonly known as: MIRALAX / GLYCOLAX   Take 17 g by mouth daily as needed. For constipation      potassium chloride SA 20 MEQ tablet   Commonly known as: K-DUR,KLOR-CON   Take 20 mEq by mouth 2 (two) times daily.      simvastatin 10 MG tablet   Commonly known as: ZOCOR   Take 10 mg by mouth at bedtime.         ASK your doctor about these medications         aspirin 325 MG tablet   Take 325 mg by mouth daily.            Disposition and Follow-up:  Follow up with PCP in 1 week  Consults:   none  Significant Diagnostic Studies:  Dg Chest 2 View  11/22/2011  *RADIOLOGY REPORT*  Clinical Data: Shortness of breath  CHEST - 2 VIEW  Comparison: 10/10/2008  Findings: Chronic interstitial markings.  No frank interstitial edema.  No pleural effusion or pneumothorax.  Stable cardiomegaly.  Valve replacement.  Degenerative changes of the visualized thoracolumbar spine.  Cholecystectomy clips.  IMPRESSION: Stable cardiomegaly with chronic  interstitial markings.  No frank interstitial edema.  Original Report Authenticated By: Charline Bills, M.D.   Ct Head Wo Contrast  11/22/2011  *RADIOLOGY REPORT*  Clinical Data: Altered mental status, auditory hallucinations  CT HEAD WITHOUT CONTRAST  Technique:  Contiguous axial images were obtained from the base of the skull through the vertex without contrast.  Comparison: 04/06/2011  Findings: No evidence of parenchymal hemorrhage or extra-axial fluid collection. No mass lesion, mass effect, or midline shift.  No CT evidence of acute infarction.  Old left basal ganglia lacunar infarct. Subcortical white matter and periventricular small vessel ischemic changes.  Intracranial atherosclerosis.  Mild global cortical atrophy.  No ventriculomegaly.  The visualized paranasal sinuses are essentially clear. The mastoid air cells are unopacified.  No evidence of calvarial fracture.  IMPRESSION: No evidence of acute intracranial abnormality.  Atrophy with small vessel ischemic changes and intracranial atherosclerosis.  Original Report Authenticated By: Charline Bills, M.D.    Brief H and P: For complete details please refer to admission H and P, but in brief 78 year old Caucasian female with history of diabetes mellitus, coronary artery disease status CABG and congestive heart failure presenting to the emergency room with history of hearing voices and seeing things. She denies any history of homicidal or suicidal ideations. She denies any chest pain or shortness of breath. No palpitations. She also denies any history of fever, chills or Rigors. She denies any abdominal discomfort. No diarrhea or hematochezia. No dysuria or hematuria.   Physical Exam on Discharge:  Filed Vitals:   11/24/11 2035 11/24/11 2043 11/25/11 0358 11/25/11 0945  BP:  129/80 116/51 124/68  Pulse:  99 83 80  Temp:  97.5 F (36.4 C) 97.8 F (36.6 C) 97.1 F (36.2 C)  TempSrc:  Oral Oral Oral  Resp:  20 21 20   Height: 5\' 2"   (1.575 m)     Weight: 118.752 kg (261 lb 12.8 oz)     SpO2:  97% 98% 98%     Intake/Output Summary (Last 24 hours) at 11/25/11 1339 Last data filed at 11/25/11 0830  Gross per 24 hour  Intake    720 ml  Output    100 ml  Net    620 ml    General: elderly female in no acute distress.  HEENT: no pallor, no icterus, moist oral mucosa, no JVD, no lymphadenopathy  Heart: Normal s1 &s2 Regular rate and rhythm, without murmurs, rubs, gallops.  Lungs: Clear to auscultation bilaterally.  Abdomen: Soft, nontender, nondistended, positive bowel sounds.  Extremities: No clubbing cyanosis or edema with positive pedal pulses.  Neuro: Alert, awake, oriented x2, and appears more awake and attentive to conversation, , no tremors, no focal    CBC:    Component Value Date/Time   WBC 5.9 11/25/2011 0645   WBC 5.6 10/12/2011 1330   WBC 5.8 04/01/2011 1422   HGB 11.3* 11/25/2011 0645   HGB 12.3 10/12/2011 1330   HGB 11.1* 04/01/2011 1422   HCT 35.9* 11/25/2011 0645   HCT 35.9 10/12/2011 1330   HCT 33.6* 04/01/2011 1422   PLT 117* 11/25/2011 0645   PLT 122* 10/12/2011 1330   PLT 142* 04/01/2011 1422   MCV 95.5 11/25/2011 0645   MCV 90.0 10/12/2011 1330   MCV 89 04/01/2011 1422   NEUTROABS 3.1 11/23/2011 0550   NEUTROABS 3.8 10/12/2011 1330   NEUTROABS 3.6 04/01/2011 1422   LYMPHSABS 1.4 11/23/2011 0550   LYMPHSABS 1.0 10/12/2011 1330   LYMPHSABS 1.2 04/01/2011 1422   MONOABS 0.8 11/23/2011 0550   MONOABS 0.7 10/12/2011 1330   EOSABS 0.2 11/23/2011 0550   EOSABS 0.1 10/12/2011 1330   EOSABS 0.1 04/01/2011 1422   BASOSABS 0.0 11/23/2011 0550   BASOSABS 0.0 10/12/2011 1330   BASOSABS 0.1 04/01/2011 1422    Basic Metabolic Panel:    Component Value Date/Time   NA 141 11/25/2011 0645   K 4.6 11/25/2011 0645   CL 102 11/25/2011 0645   CO2 31 11/25/2011 0645   BUN 20 11/25/2011 0645   CREATININE 1.12* 11/25/2011 0645   GLUCOSE 87 11/25/2011 0645   CALCIUM 9.8 11/25/2011 0645    Hospital Course:  Confusion  - unclear  etiology,  Possible for delirium with underlying dementia that could be worsening along with polypharmacy Head CT unremarkable except chr ischemic changes  UA wnl, labs unremarkable otherwise and ammonia level wnl She was seen to dose off during conversation and possibly could be due to severe sleep apnea, does not use CPAP at home.  No signs of infection  Cont aricept  It appears she is on a multiple mediations which could contribute to her delirium. Will reduce dose of neurontin to 300 mg bid, discontinue nightly Remus Loffler ( as daughter informs she has slept better when it was dced in the hospital) and also d/c  Methocarbamol She is much more alert and oriented today.  Atrial fibrillation  Rate controlled  - cont amiodarone  Patient folows with her cardiologist Dr Johney Frame and as per daughter has been off coumadin for over 6 months  DM  cont lantus and SSI  CAD with hx of CHF stable and euvolemic Cont ASA, statin ,BB and lasix. Last echo shows EF of 45%( from 2009) i will add on a low dose lisinopril  Hypokalemia  Replenished   Spoke with daughter [name] at length today and she agrees to take her home. Will resume services including HH PT / OT and aide     Time spent on Discharge: 45 minutes  Signed: Dewain Platz 11/25/2011, 1:39 PM

## 2011-11-26 ENCOUNTER — Other Ambulatory Visit: Payer: Medicare Other | Admitting: Lab

## 2011-11-26 ENCOUNTER — Ambulatory Visit: Payer: Medicare Other | Admitting: Hematology & Oncology

## 2011-11-26 NOTE — ED Notes (Signed)
PharmD Loraine Leriche at [facility] called to get clarification on the neurontin Rx. Per MD Dhungel it should read: take 300mg  BID.  This was reported back to [facility] PharmD at [facility] ([facility]) @ [phone]

## 2011-11-29 ENCOUNTER — Telehealth: Payer: Self-pay | Admitting: Hematology & Oncology

## 2011-11-29 NOTE — Telephone Encounter (Signed)
[name] called rescheduled missed appointment to 4-1, could only come between 11 am and 1 pm

## 2011-12-20 ENCOUNTER — Telehealth: Payer: Self-pay | Admitting: Hematology & Oncology

## 2011-12-20 NOTE — Telephone Encounter (Signed)
Pt moved 12-27-11 to 01-10-12

## 2011-12-27 ENCOUNTER — Other Ambulatory Visit: Payer: Medicare Other | Admitting: Lab

## 2011-12-27 ENCOUNTER — Ambulatory Visit: Payer: Medicare Other | Admitting: Hematology & Oncology

## 2012-01-10 ENCOUNTER — Other Ambulatory Visit: Payer: Medicare Other | Admitting: Lab

## 2012-01-10 ENCOUNTER — Other Ambulatory Visit: Payer: Self-pay | Admitting: Hematology & Oncology

## 2012-01-10 ENCOUNTER — Ambulatory Visit (HOSPITAL_BASED_OUTPATIENT_CLINIC_OR_DEPARTMENT_OTHER): Payer: Medicare Other | Admitting: Hematology & Oncology

## 2012-01-10 VITALS — BP 99/69 | HR 100 | Temp 97.6°F | Ht 62.0 in | Wt 252.0 lb

## 2012-01-10 DIAGNOSIS — D649 Anemia, unspecified: Secondary | ICD-10-CM

## 2012-01-10 DIAGNOSIS — N289 Disorder of kidney and ureter, unspecified: Secondary | ICD-10-CM

## 2012-01-10 DIAGNOSIS — D696 Thrombocytopenia, unspecified: Secondary | ICD-10-CM

## 2012-01-10 DIAGNOSIS — IMO0002 Reserved for concepts with insufficient information to code with codable children: Secondary | ICD-10-CM

## 2012-01-10 DIAGNOSIS — D631 Anemia in chronic kidney disease: Secondary | ICD-10-CM

## 2012-01-10 LAB — CBC WITH DIFFERENTIAL (CANCER CENTER ONLY)
BASO#: 0.1 10*3/uL (ref 0.0–0.2)
Eosinophils Absolute: 0.2 10*3/uL (ref 0.0–0.5)
HCT: 36 % (ref 34.8–46.6)
HGB: 11.6 g/dL (ref 11.6–15.9)
LYMPH#: 0.9 10*3/uL (ref 0.9–3.3)
MCH: 30.5 pg (ref 26.0–34.0)
NEUT#: 3.1 10*3/uL (ref 1.5–6.5)
NEUT%: 61.8 % (ref 39.6–80.0)
RBC: 3.8 10*6/uL (ref 3.70–5.32)

## 2012-01-11 ENCOUNTER — Telehealth: Payer: Self-pay | Admitting: Hematology & Oncology

## 2012-01-11 NOTE — Progress Notes (Signed)
This office note has been dictated.

## 2012-01-11 NOTE — Progress Notes (Signed)
DIAGNOSIS:  Anemia of renal insufficiency.  CURRENT THERAPY:  Aranesp as needed for hemoglobin less than 11.  INTERIM HISTORY:  [name] comes in for followup.  We last saw her back in July.  She seems to be holding her own.  She comes in in a wheelchair.  She really does not do all that much.  She is on all of her medications.  She is on amiodarone.  She is on Paxil, Zocor, Zestril. She also is on insulin.  She was hospitalized back in February.  She had "delirium."  It was felt that this was multifactorial in etiology.  She definitely is lucid today.  She does have sleep apnea.  She does not use the CPAP at home.  She has had no bowel or bladder incontinence.  She has had some occasional leg swelling.  PHYSICAL EXAMINATION:  General:  This is an obese white female in no obvious distress.  Vital Signs:  Show a temperature of 97.6, pulse 100, respond respiratory rate 22, blood pressure 99/69, weight is 252.  Head and Neck Exam:  Shows a normocephalic, atraumatic skull.  There are no ocular or oral lesions.  There are no palpable cervical or supraclavicular lymph nodes.  Lungs:  Clear bilaterally.  Cardiac Exam: Regular rate and rhythm with a normal S1 and S2.  She has a 2/6 systolic ejection murmur.  Abdominal Exam:  Mildly obese.  Her abdomen is soft. She has good bowel sounds.  There is no palpable hepatosplenomegaly. Extremities:  Show some trace edema in her legs.  LABORATORY STUDIES:  White cell count is 5, hemoglobin 11.6, hematocrit 36, platelet count 118.  IMPRESSION:  [name] is a 78 year old white female with multiple medical problems.  She has anemia of renal insufficiency.  She actually has done well.  She does not need any Aranesp today.  She has thrombocytopenia.  I think this is secondary to polypharmacy.  We will plan to get her back to see [place] in another few months.  I do not see that we need any blood work in between visits.  Of note, her ferritin was  423 today.   ______________________________ Josph Macho, M.D. PRE/MEDQ  D:  01/11/2012  T:  01/11/2012  Job:  [id number]

## 2012-01-11 NOTE — Telephone Encounter (Signed)
Mailed July schedule °

## 2012-03-09 ENCOUNTER — Ambulatory Visit (INDEPENDENT_AMBULATORY_CARE_PROVIDER_SITE_OTHER): Payer: Medicare Other | Admitting: Surgery

## 2012-03-09 ENCOUNTER — Encounter (INDEPENDENT_AMBULATORY_CARE_PROVIDER_SITE_OTHER): Payer: Self-pay | Admitting: Surgery

## 2012-03-09 VITALS — BP 112/62 | HR 74 | Temp 98.6°F | Resp 12 | Ht 61.0 in

## 2012-03-09 DIAGNOSIS — K644 Residual hemorrhoidal skin tags: Secondary | ICD-10-CM | POA: Insufficient documentation

## 2012-03-09 DIAGNOSIS — Z6841 Body Mass Index (BMI) 40.0 and over, adult: Secondary | ICD-10-CM

## 2012-03-09 DIAGNOSIS — K648 Other hemorrhoids: Secondary | ICD-10-CM

## 2012-03-09 HISTORY — DX: Morbid (severe) obesity due to excess calories: E66.01

## 2012-03-09 NOTE — Progress Notes (Signed)
Subjective:     Patient ID: [name], female   DOB: [birth year], 78 y.o.   MRN: [id number]  HPI  [name] [facility]  Feb 09, 1933 [id number]  Patient Care Team: [name], MD as PCP - General (Internal Medicine)  This patient is a 78 y.o.female who presents today for surgical evaluation at the request of Dr. Clent Ridges.   Reason for dilation: Anal pain and bleeding with anemia. Probable inflamed hemorrhoids.  Patient is a super morbidly obese female. Pretty much wheelchair-bound. Very difficult to walk and move. Has constant anal pain now. Has struggled with hemorrhoids for quite some time. Has constipation. Tries to use MiraLax. She gets many smaller loose bowel movements. Gets bleeding. Has to change pads several times a day. Hard to stay clean. Some leaking. She comes today with her daughter.   Numerous health tissues. Has atrial fibrillation but is off anticoagulation now. Followed by Dr.  Myna Hidalgo with hematology for her anemia.  Patient Active Problem List  Diagnosis  . AODM  . DIABETIC PERIPHERAL NEUROPATHY  . HYPERLIPIDEMIA  . OBESITY, MORBID  . ANEMIA OF RENAL FAILURE  . MAJOR DEPRESSIVE DISORDER RECURRENT EPISODE MILD  . SLEEP APNEA, OBSTRUCTIVE, MILD  . ALZHEIMERS DISEASE  . HYPERTENSION, CONTROLLED  . PAROXYSMAL ATRIAL FIBRILLATION  . ATHEROSCLEROTIC CARDIOVASCULAR DISEASE  . MUSCLE WEAKNESS (GENERALIZED)  . AORTIC VALVE REPLACEMENT, HX OF  . Bradycardia  . Delirium due to general medical condition  . CHF (congestive heart failure)  . Internal hemorrhoids with prolapse & bleeding  . Morbid obesity with BMI of 50.0-59.9, adult    Past Medical History  Diagnosis Date  . Coronary artery disease 09/04/08    single vessel CABG at time of AVR   . Aortic stenosis 09/04/08    s/p AVR by Dr Tyrone Sage (tissue valve)  . Paroxysmal atrial fibrillation   . LBBB (left bundle branch block)   . CHF (congestive heart failure)     diastolic  . Sleep apnea   . Alzheimer's  dementia   . Diabetes mellitus     type 2  . Hypertension   . Chronic anemia   . Osteoporosis   . Chronic renal failure   . Morbid obesity with BMI of 50.0-59.9, adult 03/09/2012    Past Surgical History  Procedure Date  . Coronary artery bypass graft 09/04/08    single vessel at time of AVR  . Aortic valve replacement 12/09    tissue valve by Dr Tyrone Sage  . Cholecystectomy 1995  . Cataract extraction   . Replacement total knee bilateral     L 2000, R 1997    History   Social History  . Marital Status: Widowed    Spouse Name: N/A    Number of Children: N/A  . Years of Education: N/A   Occupational History  . retired     lives with her daughter   Social History Main Topics  . Smoking status: Never Smoker   . Smokeless tobacco: Not on file  . Alcohol Use: No  . Drug Use: No  . Sexually Active: Not on file   Other Topics Concern  . Not on file   Social History Narrative   Lives in [place] with daughter.  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    Family History  Problem Relation Age of Onset  . Other Father 56    deceased. multiple MI   . Lung cancer Mother 61    died  Current Outpatient Prescriptions  Medication Sig Dispense Refill  . acetaminophen (TYLENOL) 325 MG tablet Take 650 mg by mouth every 6 (six) hours as needed. For pain      . amiodarone (PACERONE) 200 MG tablet Take 100 mg by mouth daily.      Marland Kitchen aspirin EC 81 MG tablet Take 81 mg by mouth daily.      Marland Kitchen docusate sodium (COLACE) 100 MG capsule Take 100 mg by mouth 2 (two) times daily.       Marland Kitchen donepezil (ARICEPT) 10 MG tablet Take 10 mg by mouth at bedtime as needed.        . ferrous sulfate (GNP IRON) 325 (65 FE) MG tablet Take 325 mg by mouth daily with breakfast.        . fish oil-omega-3 fatty acids 1000 MG capsule Take 1 g by mouth daily.       . furosemide (LASIX) 40 MG tablet Take 80 mg by mouth 2 (two) times daily.       Marland Kitchen gabapentin (NEURONTIN) 600 MG tablet Take  0.5 tablets (300 mg total) by mouth 2 (two) times daily. Take 1/2 tablet AM and 1 tablet at bedtime  60 tablet  0  . glucose blood test strip 1 each by Other route as needed. One touch test strips.  Use as instructed      . insulin glargine (LANTUS) 100 UNIT/ML injection Inject 14 Units into the skin at bedtime. Increasing by 2 units every 3 days until cbg gets to 120.  Increased to 14 units on 11/20/2011.      Marland Kitchen Lancet Device MISC 1 Device by Does not apply route daily as needed. Lancet thin finger sticks      . lisinopril (ZESTRIL) 2.5 MG tablet Take 1 tablet (2.5 mg total) by mouth daily.  30 tablet  2  . metolazone (ZAROXOLYN) 2.5 MG tablet Take 2.5 mg by mouth once a week. On monday      . metoprolol tartrate (LOPRESSOR) 25 MG tablet Take 12.5 mg by mouth 2 (two) times daily.      . Multiple Vitamin (MULTIVITAMIN) tablet Take 1 tablet by mouth daily.        Marland Kitchen PARoxetine (PAXIL) 40 MG tablet Take 40 mg by mouth every morning.        . polyethylene glycol (MIRALAX / GLYCOLAX) packet Take 17 g by mouth daily as needed. For constipation      . potassium chloride SA (K-DUR,KLOR-CON) 20 MEQ tablet Take 20 mEq by mouth 2 (two) times daily.       . simvastatin (ZOCOR) 10 MG tablet Take 10 mg by mouth at bedtime.           Allergies  Allergen Reactions  . Morphine And Related     "went crazy"  . Celecoxib     BP 112/62  Pulse 74  Temp 98.6 F (37 C) (Temporal)  Resp 12  Ht 5\' 1"  (1.549 m)  No results found.   Review of Systems  Constitutional: Negative for fever, chills, diaphoresis, appetite change and fatigue.  HENT: Positive for congestion. Negative for ear pain, sore throat, trouble swallowing, neck pain and ear discharge.   Eyes: Negative for photophobia, discharge and visual disturbance.  Respiratory: Negative for cough, choking, chest tightness and shortness of breath.   Cardiovascular: Negative for chest pain and palpitations.  Gastrointestinal: Positive for constipation, anal  bleeding and rectal pain. Negative for nausea, vomiting, abdominal pain and diarrhea.  Genitourinary:  Negative for dysuria, frequency and difficulty urinating.  Musculoskeletal: Positive for myalgias, back pain and arthralgias. Negative for gait problem.  Skin: Negative for color change, pallor and rash.  Neurological: Positive for weakness. Negative for dizziness, speech difficulty and numbness.  Hematological: Negative for adenopathy.  Psychiatric/Behavioral: Positive for confusion. Negative for agitation. The patient is not nervous/anxious.        Objective:   Physical Exam  Constitutional: She is oriented to person, place, and time. She appears well-developed and well-nourished. She is active and cooperative.  Non-toxic appearance. She does not have a sickly appearance. She appears ill. No distress.       Moves slowly with great effort.  Pear body habitus  HENT:  Head: Normocephalic.  Mouth/Throat: Oropharynx is clear and moist. No oropharyngeal exudate.  Eyes: Conjunctivae and EOM are normal. Pupils are equal, round, and reactive to light. No scleral icterus.  Neck: Normal range of motion. Neck supple. No tracheal deviation present.  Cardiovascular: Normal rate and intact distal pulses.   Pulmonary/Chest: Effort normal and breath sounds normal. No respiratory distress. She exhibits no tenderness.  Abdominal: Soft. She exhibits no distension and no mass. There is no tenderness. Hernia confirmed negative in the right inguinal area and confirmed negative in the left inguinal area.       Morbidly obese  Genitourinary: No vaginal discharge found.       Exam done with assistance of female Medical Assistant in the room.  Perianal skin dirty with fair hygiene.  No pruritis.  No pilonidal disease.  No fissure.  No abscess/fistula.  Tolerates digital and anoscopic rectal exam but very sensitive.  Normal sphincter tone.    Hemorrhoidal piles very large R ant > L lateral >> R post.  1 pile  partially prolapsed.  1 prolapses w Valsalva   Musculoskeletal: Normal range of motion. She exhibits edema and tenderness.  Lymphadenopathy:    She has no cervical adenopathy.       Right: No inguinal adenopathy present.       Left: No inguinal adenopathy present.  Neurological: She is alert and oriented to person, place, and time. No cranial nerve deficit. She exhibits normal muscle tone. Coordination normal.  Skin: Skin is warm and dry. No rash noted. She is not diaphoretic. No erythema.  Psychiatric: She has a normal mood and affect. Her behavior is normal.       Assessment:     Hemorrhoids with bleeding & prolapse    Plan:     Initially banded with the piles:  The anatomy & physiology of the anorectal region was discussed.  The pathophysiology of hemorrhoids and differential diagnosis was discussed.  Natural history progression  was discussed.   I stressed the importance of a bowel regimen to have daily soft bowel movements to minimize progression of disease.     The patient's symptoms are not adequately controlled.  Therefore, I recommended banding to treat the hemorrhoids.  I went over the technique, risks, benefits, and alternatives.   Goals of post-operative recovery were discussed as well.  Questions were answered.  The patient expressed understanding & wished to proceed.  The patient was positioned in the lateral decubitus position.  Perianal & rectal examination was done.  Using anoscopy, I ligated the R anterior hemorrhoid above the dentate line with banding.  The patient tolerated the procedure well.  Unfortunately, she could not tolerate the left lateral or right posterior piles being touched and therefore I aborted further banding.  Educational  handouts further explaining the pathology, treatment options, and bowel regimen were given as well.   I suspect with 2 large piles partially or easily prolapsing with anemia, this is not going to get better without an operation. She  would be a good candidate for [facility] hemorrhoidal ligation and pexy. However, that involves general anesthesia. She is obviously not low risk. Normally that as an outpatient procedure, she may require observation overnight.    I would like to get cardiac clearance considered first. Unfortunately, other options are rather limited. With incontinence, bleeding, & pain;  I don't think just using compresses is going to be adequate to handle this. They are leaning towards surgery but will let me know

## 2012-03-09 NOTE — Patient Instructions (Signed)
See the Handout(s) on Hemorrhoids we gave you.  Consider surgery.  Please call our office at [phone] if you wish to schedule surgery or if you have further questions / concerns.   HEMORRHOIDS   The rectum is the last few inches of your colon, and it naturally stretches to hold stool.  Hemorrhoidal piles are natural clusters of blood vessels that help the rectum stretch to hold stool and allow bowel movements to eliminate feces.  Hemorrhoids are abnormally swollen blood vessels in the rectum.  Too much pressure in the rectum causes hemorrhoids by forcing blood to stretch and bulge the walls of the veins, sometimes even rupturing them.  Hemorrhoids can become like varicose veins you might see on a person's legs. When bulging hemorrhoidal veins are irritated, they can swell, burn, itch, become very painful, and bleed. Once the rectal veins have been stretched out and hemorrhoids created, they are difficult to get rid of completely and tend to recur with less straining than it took to cause them in the first place. Fortunately, good habits and simple medical treatment usually control hemorrhoids well, and surgery is only recommended in unusually severe cases. Some of the most frequent causes of hemorrhoids:    Constant sitting    Straining with bowel movements (from constipation or hard stools)    Diarrhea    Sitting on the toilet for a long time    Severe coughing    Childbirth    Heavy Lifting  Types of Hemorrhoids:    Internal hemorrhoids usually don't hurt or itch; they are deep inside the rectum and usually have no sensation. However, internal hemorrhoids can bleed.  Such bleeding should not be ignored and mask blood from a dangerous source like colorectal cancer, so persistent rectal bleeding should be investigated with a colonoscopy.    External hemorrhoids cause most of the symptoms - pain, burning, and itching. Unirritated hemorrhoids can look like small skin tags coming out of the  anus.     Thrombosed hemorrhoids can form when a hemorrhoid blood vessel bursts and causes the hemorrhoid to swell.  A purple blood clot can form in it and become an excruciatingly painful lump at the anus. Because of these unpleasant symptoms, immediate incision and drainage by a surgeon at an office visit can provide much relief of the pain.    PREVENTION Avoiding the causes listed in above will prevent most cases of hemorrhoids, but this advice is sometimes hard to follow:  How can you avoid sitting all day if you have a seated job? Also, we try to avoid coughing and diarrhea, but sometimes it's beyond your control.  Still, there are some practical hints to help:    If your main job activity is seated, always stand or walk during your breaks. Make it a point to stand and walk at least 5 minutes every hour and try to shift frequently in your chair to avoid direct rectal pressure.    Always exhale as you strain or lift. Don't hold your breath.    Treat coughing, diarrhea and constipation early since irritated hemorrhoids may soon follow.    Do not delay or try to prevent a bowel movement when the urge is present.   Exercise regularly (walking or jogging 60 minutes a day) to stimulate the bowels to move.   Avoid dry toilet paper when cleaning after bowel movements.  Moistened tissues such as baby wipes are less irritating.  Lightly pat the rectal area dry.  Using  irrigating showers or bottle irrigation washing can more gently clean this sensitive area.   Keep the anal and genital area clean and  dry.  Talcum or baby powders can help   GET YOUR STOOLS SOFT.   This is the most important way to prevent irritated hemorrhoids.  Hard stools are like sandpaper to the anorectal canal and will cause more problems.   The goal: ONE SOFT BOWEL MOVEMENT A DAY!  To have soft, regular bowel movements:    Drink at least 8 tall glasses of water a day.     AVOID CONSTIPATION    Take plenty of fiber.  Fiber is the  undigested part of plant food that passes into the colon, acting s "natures broom" to encourage bowel motility and movement.  Fiber can absorb and hold large amounts of water. This results in a larger, bulkier stool, which is soft and easier to pass. Work gradually over several weeks up to 6 servings a day of fiber (25g a day even more if needed) in the form of: o Vegetables -- Root (potatoes, carrots, turnips), leafy green (lettuce, salad greens, celery, spinach), or cooked high residue (cabbage, broccoli, etc) o Fruit -- Fresh (unpeeled skin & pulp), Dried (prunes, apricots, cherries, etc ),  or stewed ( applesauce)  o Whole grain breads, pasta, etc (whole wheat)  o Bran cereals    Bulking Agents -- This type of water-retaining fiber generally is easily obtained each day by one of the following:  o Psyllium bran -- The psyllium plant is remarkable because its ground seeds can retain so much water. This product is available as Metamucil, Konsyl, Effersyllium, Per Diem Fiber, or the less expensive generic preparation in drug and health food stores. Although labeled a laxative, it really is not a laxative.  o Methylcellulose -- This is another fiber derived from wood which also retains water. It is available as Citrucel. o Polyethylene Glycol - and "artificial" fiber commonly called Miralax or Glycolax.  It is helpful for people with gassy or bloated feelings with regular fiber o Flax Seed - a less gassy fiber than psyllium   No reading or other relaxing activity while on the toilet. If bowel movements take longer than 5 minutes, you are too constipated   Laxatives can be useful for a short period if constipation is severe o Osmotics (Milk of Magnesia, Fleets phosphosoda, Magnesium citrate, MiraLax, GoLytely) are safer than  o Stimulants (Senokot, Castor Oil, Dulcolax, Ex Lax)    o Do not take laxatives for more than 7days in a row.   Laxatives are not a good long-term solution as it can stress the  intestine and colon and causes too much mineral and fluid losses.    If badly constipated, try a Bowel Retraining Program: o Do not use laxatives.  o Eat a diet high in roughage, such as bran cereals and leafy vegetables.  o Drink six (6) ounces of prune or apricot juice each morning.  o Eat two (2) large servings of stewed fruit each day.  o Take one (1) heaping dose of a bulking agent (ex. Metamucil, Citrucel, Miralax) twice a day.  o Use sugar-free sweetener when possible to avoid excessive calories.  o Eat a normal breakfast.  o Set aside 15 minutes after breakfast to sit on the toilet, but do not strain to have a bowel movement.  o If you do not have a bowel movement by the third day, use an enema and repeat the above  steps.    AVOID DIARRHEA o Switch to liquids and simpler foods for a few days to avoid stressing your intestines further. o Avoid dairy products (especially milk & ice cream) for a short time.  The intestines often can lose the ability to digest lactose when stressed. o Avoid foods that cause gassiness or bloating.  Typical foods include beans and other legumes, cabbage, broccoli, and dairy foods.  Every person has some sensitivity to other foods, so listen to our body and avoid those foods that trigger problems for you. o Adding fiber (Citrucel, Metamucil, psyllium, Miralax) gradually can help thicken stools by absorbing excess fluid and retrain the intestines to act more normally.  Slowly increase the dose over a few weeks.  Too much fiber too soon can backfire and cause cramping & bloating. o Probiotics (such as active yogurt, Align, etc) may help repopulate the intestines and colon with normal bacteria and calm down a sensitive digestive tract.  Most studies show it to be of mild help, though, and such products can be costly. o Medicines:   Bismuth subsalicylate (ex. Kayopectate, Pepto Bismol) every 30 minutes for up to 6 doses can help control diarrhea.  Avoid if pregnant.    Loperamide (Immodium) can slow down diarrhea.  Start with two tablets (4mg  total) first and then try one tablet every 6 hours.  Avoid if you are having fevers or severe pain.  If you are not better or start feeling worse, stop all medicines and call your doctor for advice o Call your doctor if you are getting worse or not better.  Sometimes further testing (cultures, endoscopy, X-ray studies, bloodwork, etc) may be needed to help diagnose and treat the cause of the diarrhea.   If these preventive measures fail, you must take action right away! Hemorrhoids are one condition that can be mild in the morning and become intolerable by nightfall.

## 2012-03-27 ENCOUNTER — Telehealth (INDEPENDENT_AMBULATORY_CARE_PROVIDER_SITE_OTHER): Payer: Self-pay

## 2012-03-27 NOTE — Telephone Encounter (Signed)
LMOM stating that we needed note for cardiac clearance b/c the office note from 6/19 stated the clearance for the sx would depend on labs the pt was going to have drawn. I stated the pt has not scheduled sx yet pending on the clearance.

## 2012-04-03 ENCOUNTER — Ambulatory Visit (HOSPITAL_BASED_OUTPATIENT_CLINIC_OR_DEPARTMENT_OTHER): Payer: Medicare Other | Admitting: Hematology & Oncology

## 2012-04-03 ENCOUNTER — Ambulatory Visit (HOSPITAL_BASED_OUTPATIENT_CLINIC_OR_DEPARTMENT_OTHER): Payer: Medicare Other | Admitting: Lab

## 2012-04-03 DIAGNOSIS — N289 Disorder of kidney and ureter, unspecified: Secondary | ICD-10-CM

## 2012-04-03 DIAGNOSIS — D631 Anemia in chronic kidney disease: Secondary | ICD-10-CM

## 2012-04-03 DIAGNOSIS — D649 Anemia, unspecified: Secondary | ICD-10-CM

## 2012-04-03 DIAGNOSIS — IMO0002 Reserved for concepts with insufficient information to code with codable children: Secondary | ICD-10-CM

## 2012-04-03 DIAGNOSIS — E1169 Type 2 diabetes mellitus with other specified complication: Secondary | ICD-10-CM

## 2012-04-03 LAB — IRON AND TIBC
%SAT: 23 % (ref 20–55)
TIBC: 318 ug/dL (ref 250–470)

## 2012-04-03 LAB — RETICULOCYTES (CHCC)
ABS Retic: 63.5 10*3/uL (ref 19.0–186.0)
RBC.: 3.97 MIL/uL (ref 3.87–5.11)
Retic Ct Pct: 1.6 % (ref 0.4–2.3)

## 2012-04-03 LAB — CBC WITH DIFFERENTIAL (CANCER CENTER ONLY)
BASO#: 0.1 10*3/uL (ref 0.0–0.2)
Eosinophils Absolute: 0.3 10*3/uL (ref 0.0–0.5)
HCT: 37.6 % (ref 34.8–46.6)
LYMPH%: 18.7 % (ref 14.0–48.0)
MCV: 96 fL (ref 81–101)
MONO#: 0.7 10*3/uL (ref 0.1–0.9)
NEUT%: 61.7 % (ref 39.6–80.0)
RBC: 3.92 10*6/uL (ref 3.70–5.32)
WBC: 5.7 10*3/uL (ref 3.9–10.0)

## 2012-04-03 LAB — FERRITIN: Ferritin: 244 ng/mL (ref 10–291)

## 2012-04-03 NOTE — Progress Notes (Signed)
This office note has been dictated.

## 2012-04-03 NOTE — Progress Notes (Signed)
CC:   Stacy Showers, MD  DIAGNOSIS:  Anemia of renal insufficiency.  CURRENT THERAPY:  Aranesp 300 mcg subcu as needed for hemoglobin less than 11.  INTERIM HISTORY:  [name] comes in for followup.  She is doing okay. Her knees are bothering her.  She is in a wheelchair.  She really cannot do all that much because of her weight, her knees and her other health issues.  She has not been in the hospital since we last saw her, which is a blessing.  She has had bleeding, however.  She states she has some hemorrhoidal issues.  These appear to have resolved.  When we last saw her in April, her ferritin was 423.  She has had no fever.  She has had no nausea or vomiting.  There has been no increased shortness of breath.  Overall, her performance status is probably ECOG 3.  PHYSICAL EXAMINATION:  General:  This is an obese, somewhat frail appearing white female in no obvious distress.  Vital signs: Temperature of 97.4, pulse 58, respiratory rate 18, blood pressure 128/55.  Weight is 237.  Head and neck:  Exam shows a normocephalic, atraumatic skull.  There are no ocular or oral lesions.  There are no palpable cervical or supraclavicular lymph nodes.  Lungs:  Clear to percussion and auscultation bilaterally.  Cardiac:  Regular rate and rhythm with a normal S1 and S2.  She has a 2/6 systolic ejection murmur. Abdomen:  Obese but soft.  There is no tenderness to palpation.  There is no fluid wave.  There is no palpable hepatosplenomegaly. Extremities:  Chronic stasis dermatitis type changes in the lower legs. She has surgical scars on her right knee.  Skin:  Exam shows no rashes, ecchymoses or petechiae.  LABORATORY STUDIES:  White cell count 5.7, hemoglobin 12.1, hematocrit 37.6, platelet count is 110.  MCV is 96.  IMPRESSION:  [name] is a 78 year old white female with anemia of renal insufficiency.  She is on numerous medications.  Her platelet count is dropping a little bit.   I am really not too worried about this right now. She is not anemic.  We do not need to give her any Aranesp today.  I think we can probably try to get her back now in 3 months for followup.  We hopefully can be conservative with our Aranesp administration.  We will watch the platelet counts.  I suspect this is more medication related than anything else.  However, I would not change any of her medicines right now as the platelet count really is not low enough to cause issues.    ______________________________ Josph Macho, M.D. PRE/MEDQ  D:  04/03/2012  T:  04/03/2012  Job:  [id number]

## 2012-04-11 ENCOUNTER — Inpatient Hospital Stay (HOSPITAL_COMMUNITY)
Admission: EM | Admit: 2012-04-11 | Discharge: 2012-04-15 | DRG: 092 | Disposition: A | Discharge status: Home Health | Payer: Medicare Other | Attending: Family Medicine | Admitting: Family Medicine

## 2012-04-11 ENCOUNTER — Encounter (HOSPITAL_COMMUNITY): Payer: Self-pay

## 2012-04-11 ENCOUNTER — Emergency Department (HOSPITAL_COMMUNITY): Payer: Medicare Other

## 2012-04-11 DIAGNOSIS — F3289 Other specified depressive episodes: Secondary | ICD-10-CM | POA: Diagnosis present

## 2012-04-11 DIAGNOSIS — L03115 Cellulitis of right lower limb: Secondary | ICD-10-CM | POA: Diagnosis present

## 2012-04-11 DIAGNOSIS — L02219 Cutaneous abscess of trunk, unspecified: Secondary | ICD-10-CM | POA: Diagnosis present

## 2012-04-11 DIAGNOSIS — F0280 Dementia in other diseases classified elsewhere without behavioral disturbance: Secondary | ICD-10-CM | POA: Diagnosis present

## 2012-04-11 DIAGNOSIS — M810 Age-related osteoporosis without current pathological fracture: Secondary | ICD-10-CM | POA: Diagnosis present

## 2012-04-11 DIAGNOSIS — E785 Hyperlipidemia, unspecified: Secondary | ICD-10-CM

## 2012-04-11 DIAGNOSIS — D696 Thrombocytopenia, unspecified: Secondary | ICD-10-CM | POA: Diagnosis present

## 2012-04-11 DIAGNOSIS — F05 Delirium due to known physiological condition: Secondary | ICD-10-CM

## 2012-04-11 DIAGNOSIS — I509 Heart failure, unspecified: Secondary | ICD-10-CM

## 2012-04-11 DIAGNOSIS — Z66 Do not resuscitate: Secondary | ICD-10-CM | POA: Diagnosis present

## 2012-04-11 DIAGNOSIS — I4891 Unspecified atrial fibrillation: Secondary | ICD-10-CM | POA: Diagnosis present

## 2012-04-11 DIAGNOSIS — N189 Chronic kidney disease, unspecified: Secondary | ICD-10-CM | POA: Diagnosis present

## 2012-04-11 DIAGNOSIS — Z794 Long term (current) use of insulin: Secondary | ICD-10-CM

## 2012-04-11 DIAGNOSIS — G9340 Encephalopathy, unspecified: Secondary | ICD-10-CM | POA: Diagnosis present

## 2012-04-11 DIAGNOSIS — M6281 Muscle weakness (generalized): Secondary | ICD-10-CM | POA: Diagnosis present

## 2012-04-11 DIAGNOSIS — N039 Chronic nephritic syndrome with unspecified morphologic changes: Secondary | ICD-10-CM | POA: Diagnosis present

## 2012-04-11 DIAGNOSIS — E1149 Type 2 diabetes mellitus with other diabetic neurological complication: Secondary | ICD-10-CM

## 2012-04-11 DIAGNOSIS — K644 Residual hemorrhoidal skin tags: Secondary | ICD-10-CM

## 2012-04-11 DIAGNOSIS — R001 Bradycardia, unspecified: Secondary | ICD-10-CM | POA: Diagnosis present

## 2012-04-11 DIAGNOSIS — F329 Major depressive disorder, single episode, unspecified: Secondary | ICD-10-CM | POA: Diagnosis present

## 2012-04-11 DIAGNOSIS — Z7982 Long term (current) use of aspirin: Secondary | ICD-10-CM

## 2012-04-11 DIAGNOSIS — G4733 Obstructive sleep apnea (adult) (pediatric): Secondary | ICD-10-CM | POA: Diagnosis present

## 2012-04-11 DIAGNOSIS — Z6841 Body Mass Index (BMI) 40.0 and over, adult: Secondary | ICD-10-CM

## 2012-04-11 DIAGNOSIS — E662 Morbid (severe) obesity with alveolar hypoventilation: Secondary | ICD-10-CM | POA: Diagnosis present

## 2012-04-11 DIAGNOSIS — Z91199 Patient's noncompliance with other medical treatment and regimen due to unspecified reason: Secondary | ICD-10-CM

## 2012-04-11 DIAGNOSIS — L03119 Cellulitis of unspecified part of limb: Secondary | ICD-10-CM | POA: Diagnosis present

## 2012-04-11 DIAGNOSIS — G92 Toxic encephalopathy: Principal | ICD-10-CM | POA: Diagnosis present

## 2012-04-11 DIAGNOSIS — Z96659 Presence of unspecified artificial knee joint: Secondary | ICD-10-CM

## 2012-04-11 DIAGNOSIS — L659 Nonscarring hair loss, unspecified: Secondary | ICD-10-CM | POA: Diagnosis present

## 2012-04-11 DIAGNOSIS — R0902 Hypoxemia: Secondary | ICD-10-CM | POA: Diagnosis present

## 2012-04-11 DIAGNOSIS — Z9119 Patient's noncompliance with other medical treatment and regimen: Secondary | ICD-10-CM

## 2012-04-11 DIAGNOSIS — L02419 Cutaneous abscess of limb, unspecified: Secondary | ICD-10-CM | POA: Diagnosis present

## 2012-04-11 DIAGNOSIS — I5022 Chronic systolic (congestive) heart failure: Secondary | ICD-10-CM | POA: Diagnosis present

## 2012-04-11 DIAGNOSIS — Z954 Presence of other heart-valve replacement: Secondary | ICD-10-CM

## 2012-04-11 DIAGNOSIS — E860 Dehydration: Secondary | ICD-10-CM

## 2012-04-11 DIAGNOSIS — Z951 Presence of aortocoronary bypass graft: Secondary | ICD-10-CM

## 2012-04-11 DIAGNOSIS — Z952 Presence of prosthetic heart valve: Secondary | ICD-10-CM

## 2012-04-11 DIAGNOSIS — D631 Anemia in chronic kidney disease: Secondary | ICD-10-CM | POA: Diagnosis present

## 2012-04-11 DIAGNOSIS — E119 Type 2 diabetes mellitus without complications: Secondary | ICD-10-CM

## 2012-04-11 DIAGNOSIS — G929 Unspecified toxic encephalopathy: Principal | ICD-10-CM | POA: Diagnosis present

## 2012-04-11 DIAGNOSIS — I2510 Atherosclerotic heart disease of native coronary artery without angina pectoris: Secondary | ICD-10-CM

## 2012-04-11 DIAGNOSIS — G309 Alzheimer's disease, unspecified: Secondary | ICD-10-CM | POA: Diagnosis present

## 2012-04-11 DIAGNOSIS — R509 Fever, unspecified: Secondary | ICD-10-CM

## 2012-04-11 DIAGNOSIS — I129 Hypertensive chronic kidney disease with stage 1 through stage 4 chronic kidney disease, or unspecified chronic kidney disease: Secondary | ICD-10-CM | POA: Diagnosis present

## 2012-04-11 DIAGNOSIS — F330 Major depressive disorder, recurrent, mild: Secondary | ICD-10-CM

## 2012-04-11 DIAGNOSIS — E876 Hypokalemia: Secondary | ICD-10-CM | POA: Diagnosis not present

## 2012-04-11 DIAGNOSIS — R6510 Systemic inflammatory response syndrome (SIRS) of non-infectious origin without acute organ dysfunction: Secondary | ICD-10-CM | POA: Diagnosis present

## 2012-04-11 DIAGNOSIS — I10 Essential (primary) hypertension: Secondary | ICD-10-CM | POA: Diagnosis present

## 2012-04-11 DIAGNOSIS — L03319 Cellulitis of trunk, unspecified: Secondary | ICD-10-CM | POA: Diagnosis present

## 2012-04-11 DIAGNOSIS — Z79899 Other long term (current) drug therapy: Secondary | ICD-10-CM

## 2012-04-11 LAB — CBC WITH DIFFERENTIAL/PLATELET
Basophils Relative: 0 % (ref 0–1)
Eosinophils Absolute: 0 10*3/uL (ref 0.0–0.7)
Eosinophils Relative: 0 % (ref 0–5)
HCT: 38.2 % (ref 36.0–46.0)
Hemoglobin: 12.7 g/dL (ref 12.0–15.0)
Lymphs Abs: 1 10*3/uL (ref 0.7–4.0)
MCH: 31 pg (ref 26.0–34.0)
MCHC: 33.2 g/dL (ref 30.0–36.0)
MCV: 93.2 fL (ref 78.0–100.0)
Monocytes Absolute: 1.5 10*3/uL — ABNORMAL HIGH (ref 0.1–1.0)
Monocytes Relative: 7 % (ref 3–12)
Neutrophils Relative %: 88 % — ABNORMAL HIGH (ref 43–77)
RBC: 4.1 MIL/uL (ref 3.87–5.11)

## 2012-04-11 LAB — HEPATIC FUNCTION PANEL
Albumin: 3.4 g/dL — ABNORMAL LOW (ref 3.5–5.2)
Bilirubin, Direct: 0.5 mg/dL — ABNORMAL HIGH (ref 0.0–0.3)
Total Bilirubin: 1.2 mg/dL (ref 0.3–1.2)

## 2012-04-11 LAB — BASIC METABOLIC PANEL
BUN: 21 mg/dL (ref 6–23)
CO2: 28 mEq/L (ref 19–32)
GFR calc non Af Amer: 49 mL/min — ABNORMAL LOW (ref >90)
Glucose, Bld: 157 mg/dL — ABNORMAL HIGH (ref 70–99)
Potassium: 4 mEq/L (ref 3.5–5.1)
Sodium: 136 mEq/L (ref 135–145)

## 2012-04-11 LAB — URINALYSIS, ROUTINE W REFLEX MICROSCOPIC
Glucose, UA: NEGATIVE mg/dL
Hgb urine dipstick: NEGATIVE
Leukocytes, UA: NEGATIVE
Protein, ur: NEGATIVE mg/dL
Specific Gravity, Urine: 1.009 (ref 1.005–1.030)

## 2012-04-11 LAB — PROCALCITONIN: Procalcitonin: 2.7 ng/mL

## 2012-04-11 LAB — PROTIME-INR: INR: 1.17 (ref 0.00–1.49)

## 2012-04-11 LAB — LIPASE, BLOOD: Lipase: 23 U/L (ref 11–59)

## 2012-04-11 MED ORDER — SODIUM CHLORIDE 0.9 % IJ SOLN
3.0000 mL | Freq: Two times a day (BID) | INTRAMUSCULAR | Status: DC
  Administered 2012-04-12 (×2): 3 mL via INTRAVENOUS

## 2012-04-11 MED ORDER — GABAPENTIN 300 MG PO CAPS
300.0000 mg | ORAL_CAPSULE | Freq: Two times a day (BID) | ORAL | Status: DC
  Administered 2012-04-12 – 2012-04-15 (×8): 300 mg via ORAL
  Filled 2012-04-11 (×9): qty 1

## 2012-04-11 MED ORDER — INSULIN GLARGINE 100 UNIT/ML ~~LOC~~ SOLN
10.0000 [IU] | Freq: Two times a day (BID) | SUBCUTANEOUS | Status: DC
  Administered 2012-04-12 (×3): 10 [IU] via SUBCUTANEOUS

## 2012-04-11 MED ORDER — INSULIN ASPART 100 UNIT/ML ~~LOC~~ SOLN
0.0000 [IU] | Freq: Three times a day (TID) | SUBCUTANEOUS | Status: DC
  Administered 2012-04-12: 1 [IU] via SUBCUTANEOUS
  Administered 2012-04-14: 2 [IU] via SUBCUTANEOUS

## 2012-04-11 MED ORDER — ACETAMINOPHEN 325 MG PO TABS
650.0000 mg | ORAL_TABLET | Freq: Four times a day (QID) | ORAL | Status: DC | PRN
  Administered 2012-04-12: 650 mg via ORAL
  Filled 2012-04-11: qty 2

## 2012-04-11 MED ORDER — ACETAMINOPHEN 325 MG PO TABS
650.0000 mg | ORAL_TABLET | Freq: Once | ORAL | Status: AC
  Administered 2012-04-11: 650 mg via ORAL
  Filled 2012-04-11: qty 2

## 2012-04-11 MED ORDER — POLYETHYLENE GLYCOL 3350 17 G PO PACK
17.0000 g | PACK | Freq: Every day | ORAL | Status: DC | PRN
  Filled 2012-04-11: qty 1

## 2012-04-11 MED ORDER — DOCUSATE SODIUM 100 MG PO CAPS
100.0000 mg | ORAL_CAPSULE | Freq: Two times a day (BID) | ORAL | Status: DC
  Administered 2012-04-12 – 2012-04-15 (×7): 100 mg via ORAL
  Filled 2012-04-11 (×9): qty 1

## 2012-04-11 MED ORDER — ENOXAPARIN SODIUM 40 MG/0.4ML ~~LOC~~ SOLN
40.0000 mg | SUBCUTANEOUS | Status: DC
  Administered 2012-04-12: 40 mg via SUBCUTANEOUS
  Filled 2012-04-11 (×2): qty 0.4

## 2012-04-11 MED ORDER — SODIUM CHLORIDE 0.9 % IV SOLN
INTRAVENOUS | Status: DC
  Administered 2012-04-11: 17:00:00 via INTRAVENOUS

## 2012-04-11 MED ORDER — VANCOMYCIN HCL 1000 MG IV SOLR
1500.0000 mg | INTRAVENOUS | Status: DC
  Administered 2012-04-13 (×2): 1500 mg via INTRAVENOUS
  Filled 2012-04-11 (×4): qty 1500

## 2012-04-11 MED ORDER — VANCOMYCIN HCL IN DEXTROSE 1-5 GM/200ML-% IV SOLN
1000.0000 mg | INTRAVENOUS | Status: AC
  Administered 2012-04-12: 1000 mg via INTRAVENOUS
  Filled 2012-04-11: qty 200

## 2012-04-11 MED ORDER — INSULIN ASPART 100 UNIT/ML ~~LOC~~ SOLN
0.0000 [IU] | Freq: Three times a day (TID) | SUBCUTANEOUS | Status: DC
  Administered 2012-04-12: 1 [IU] via SUBCUTANEOUS

## 2012-04-11 MED ORDER — SIMVASTATIN 10 MG PO TABS
10.0000 mg | ORAL_TABLET | Freq: Every day | ORAL | Status: DC
  Administered 2012-04-12 – 2012-04-14 (×4): 10 mg via ORAL
  Filled 2012-04-11 (×6): qty 1

## 2012-04-11 MED ORDER — ASPIRIN EC 81 MG PO TBEC
81.0000 mg | DELAYED_RELEASE_TABLET | Freq: Every day | ORAL | Status: DC
  Administered 2012-04-12 – 2012-04-15 (×4): 81 mg via ORAL
  Filled 2012-04-11 (×4): qty 1

## 2012-04-11 MED ORDER — ONDANSETRON HCL 4 MG PO TABS: 4.0000 mg | ORAL_TABLET | Freq: Four times a day (QID) | ORAL | Status: DC | PRN

## 2012-04-11 MED ORDER — VANCOMYCIN HCL IN DEXTROSE 1-5 GM/200ML-% IV SOLN
1000.0000 mg | Freq: Once | INTRAVENOUS | Status: AC
  Administered 2012-04-11: 1000 mg via INTRAVENOUS
  Filled 2012-04-11: qty 200

## 2012-04-11 MED ORDER — GABAPENTIN 600 MG PO TABS
300.0000 mg | ORAL_TABLET | Freq: Two times a day (BID) | ORAL | Status: DC
Start: 2012-04-11 — End: 2012-04-11
  Filled 2012-04-11 (×2): qty 0.5

## 2012-04-11 MED ORDER — ACETAMINOPHEN 650 MG RE SUPP: 650.0000 mg | Freq: Four times a day (QID) | RECTAL | Status: DC | PRN

## 2012-04-11 MED ORDER — AMIODARONE HCL 100 MG PO TABS
100.0000 mg | ORAL_TABLET | Freq: Every day | ORAL | Status: DC
  Administered 2012-04-12 – 2012-04-15 (×4): 100 mg via ORAL
  Filled 2012-04-11 (×4): qty 1

## 2012-04-11 MED ORDER — FUROSEMIDE 10 MG/ML IJ SOLN
40.0000 mg | Freq: Once | INTRAMUSCULAR | Status: AC
  Administered 2012-04-12: 40 mg via INTRAVENOUS
  Filled 2012-04-11 (×2): qty 4

## 2012-04-11 MED ORDER — SODIUM CHLORIDE 0.9 % IV SOLN: INTRAVENOUS | Status: DC

## 2012-04-11 MED ORDER — SODIUM CHLORIDE 0.9 % IV BOLUS (SEPSIS): 500.0000 mL | Freq: Once | INTRAVENOUS | Status: DC

## 2012-04-11 MED ORDER — PIPERACILLIN-TAZOBACTAM 3.375 G IVPB
3.3750 g | Freq: Three times a day (TID) | INTRAVENOUS | Status: DC
  Administered 2012-04-12 (×2): 3.375 g via INTRAVENOUS
  Filled 2012-04-11 (×4): qty 50

## 2012-04-11 MED ORDER — FERROUS SULFATE 325 (65 FE) MG PO TABS
325.0000 mg | ORAL_TABLET | Freq: Every day | ORAL | Status: DC
  Administered 2012-04-12 – 2012-04-15 (×4): 325 mg via ORAL
  Filled 2012-04-11 (×5): qty 1

## 2012-04-11 MED ORDER — SODIUM CHLORIDE 0.9 % IV BOLUS (SEPSIS)
500.0000 mL | Freq: Once | INTRAVENOUS | Status: AC
  Administered 2012-04-11: 500 mL via INTRAVENOUS

## 2012-04-11 MED ORDER — ONDANSETRON HCL 4 MG/2ML IJ SOLN: 4.0000 mg | Freq: Four times a day (QID) | INTRAMUSCULAR | Status: DC | PRN

## 2012-04-11 NOTE — ED Notes (Signed)
In and out cath performed by Carollee Herter, RN with assistance from [place], EMT and Larita Fife, RN

## 2012-04-11 NOTE — ED Notes (Addendum)
o2 sats 88% RA; placed on 3 L O2/[place] - o2 sats increasing to 98%

## 2012-04-11 NOTE — ED Notes (Signed)
MD at bedside. 

## 2012-04-11 NOTE — ED Notes (Signed)
Pt undressed, in gown, on monitor, continuous pulse oximetry and blood pressure cuff; EKG performed 

## 2012-04-11 NOTE — H&P (Signed)
[name] is an 78 y.o. female.   PCP - [name]. Chief Complaint: Fever and altered mental status. HPI: 78 year-old female with known history of CAD status post CABG, aortic valve replacement with tissue valve, paroxysmal atrial fibrillation not on Coumadin, systolic CHF last EF measured was 45% in 2009, dementia and who is a noncompliant with CPAP, diabetes mellitus2 and CKD was brought to the ER by patient's daughter with whom patient lives as patient's daughter noticed patient getting increasingly confused and febrile since waking up in the morning yesterday. Since patient's symptoms was persistent patient was brought to the ER later in the evening. In the ER patient was found to be febrile with temperatures around 102F. Chest x-ray and urine did not show any definite source for infection. It was noted patient skin in the right pannus and right thigh is erythematous and indurated. Patient also initially was found to be hypoxic and was placed on 2 L oxygen. Patient as per daughter has been having chronic cough. Denies any diarrhea or abdominal pain the she does have nausea. But by the evening patient's appetite has improved and wants to eat something. Initially patient was confused but by the time I examined patient is more alert awake and oriented to time place and person. Patient does complain of right shoulder and neck pain which daughter states has been chronic and nothing new. Patient specifically denies any chest pain but did have some shortness of breath.   Past Medical History  Diagnosis Date  . Coronary artery disease 09/04/08    single vessel CABG at time of AVR   . Aortic stenosis 09/04/08    s/p AVR by Dr Tyrone Sage (tissue valve)  . Paroxysmal atrial fibrillation   . LBBB (left bundle branch block)   . CHF (congestive heart failure)     diastolic  . Sleep apnea   . Alzheimer's dementia   . Diabetes mellitus     type 2  . Hypertension   . Chronic anemia   . Osteoporosis     . Chronic renal failure   . Morbid obesity with BMI of 50.0-59.9, adult 03/09/2012    Past Surgical History  Procedure Date  . Coronary artery bypass graft 09/04/08    single vessel at time of AVR  . Aortic valve replacement 12/09    tissue valve by Dr Tyrone Sage  . Cholecystectomy 1995  . Cataract extraction   . Replacement total knee bilateral     L 2000, R 1997  . Appendectomy     Family History  Problem Relation Age of Onset  . Other Father 73    deceased. multiple MI   . Lung cancer Mother 31    died   Social History:  reports that she has never smoked. She does not have any smokeless tobacco history on file. She reports that she does not drink alcohol or use illicit drugs.  Allergies:  Allergies  Allergen Reactions  . Morphine And Related     "went crazy"  . Celecoxib Other (See Comments)    Sore mouth    Medications Prior to Admission  Medication Sig Dispense Refill  . acetaminophen (TYLENOL) 325 MG tablet Take 650 mg by mouth every 6 (six) hours as needed. For pain      . amiodarone (PACERONE) 200 MG tablet Take 100 mg by mouth daily.      Marland Kitchen aspirin EC 81 MG tablet Take 81 mg by mouth daily.      Marland Kitchen  docusate sodium (COLACE) 100 MG capsule Take 100 mg by mouth 2 (two) times daily.       Marland Kitchen donepezil (ARICEPT) 10 MG tablet Take 10 mg by mouth at bedtime.      . ferrous sulfate 325 (65 FE) MG tablet Take 325 mg by mouth 2 (two) times daily.      . fish oil-omega-3 fatty acids 1000 MG capsule Take 1 g by mouth daily.       . furosemide (LASIX) 40 MG tablet Take 80 mg by mouth 2 (two) times daily.       Marland Kitchen gabapentin (NEURONTIN) 600 MG tablet Take 300-600 mg by mouth 2 (two) times daily. Take 1/2 tab in the morning and 1 tab at bedtime      . insulin glargine (LANTUS) 100 UNIT/ML injection Inject 25 Units into the skin 2 (two) times daily.      . metolazone (ZAROXOLYN) 2.5 MG tablet Take 2.5 mg by mouth See admin instructions. Take once a week on mondays as needed if  weight is >250 lbs      . Multiple Vitamin (MULTIVITAMIN) tablet Take 1 tablet by mouth daily.        . Multiple Vitamins-Minerals (VITEYES AREDS ADVANCED) CAPS Take by mouth.      Marland Kitchen OVER THE COUNTER MEDICATION Apply 1 application topically daily as needed. Apply antifungal powder as needed. Make sure area is completely dry.      Marland Kitchen PARoxetine (PAXIL) 40 MG tablet Take 40 mg by mouth every morning.        . polyethylene glycol (MIRALAX / GLYCOLAX) packet Take 17 g by mouth daily as needed. For constipation      . potassium chloride SA (K-DUR,KLOR-CON) 20 MEQ tablet Take 20 mEq by mouth 2 (two) times daily.       . simvastatin (ZOCOR) 10 MG tablet Take 10 mg by mouth at bedtime.        Marland Kitchen glucose blood test strip 1 each by Other route as needed. One touch test strips.  Use as instructed      . Lancet Device MISC 1 Device by Does not apply route daily as needed. Lancet thin finger sticks        Results for orders placed during the hospital encounter of 04/11/12 (from the past 48 hour(s))  URINALYSIS, ROUTINE W REFLEX MICROSCOPIC     Status: Normal   Collection Time   04/11/12  5:00 PM      Component Value Range Comment   Color, Urine YELLOW  YELLOW    APPearance CLEAR  CLEAR    Specific Gravity, Urine 1.009  1.005 - 1.030    pH 6.0  5.0 - 8.0    Glucose, UA NEGATIVE  NEGATIVE mg/dL    Hgb urine dipstick NEGATIVE  NEGATIVE    Bilirubin Urine NEGATIVE  NEGATIVE    Ketones, ur NEGATIVE  NEGATIVE mg/dL    Protein, ur NEGATIVE  NEGATIVE mg/dL    Urobilinogen, UA 1.0  0.0 - 1.0 mg/dL    Nitrite NEGATIVE  NEGATIVE    Leukocytes, UA NEGATIVE  NEGATIVE MICROSCOPIC NOT DONE ON URINES WITH NEGATIVE PROTEIN, BLOOD, LEUKOCYTES, NITRITE, OR GLUCOSE <1000 mg/dL.  LACTIC ACID, PLASMA     Status: Normal   Collection Time   04/11/12  5:03 PM      Component Value Range Comment   Lactic Acid, Venous 1.5  0.5 - 2.2 mmol/L   PROCALCITONIN     Status: Normal  Collection Time   04/11/12  5:03 PM       Component Value Range Comment   Procalcitonin 2.70     BASIC METABOLIC PANEL     Status: Abnormal   Collection Time   04/11/12  5:04 PM      Component Value Range Comment   Sodium 136  135 - 145 mEq/L    Potassium 4.0  3.5 - 5.1 mEq/L    Chloride 97  96 - 112 mEq/L    CO2 28  19 - 32 mEq/L    Glucose, Bld 157 (*) 70 - 99 mg/dL    BUN 21  6 - 23 mg/dL    Creatinine, Ser 1.61  0.50 - 1.10 mg/dL    Calcium 9.1  8.4 - 09.6 mg/dL    GFR calc non Af Amer 49 (*) >90 mL/min    GFR calc Af Amer 57 (*) >90 mL/min   CBC WITH DIFFERENTIAL     Status: Abnormal   Collection Time   04/11/12  5:04 PM      Component Value Range Comment   WBC 20.3 (*) 4.0 - 10.5 K/uL    RBC 4.10  3.87 - 5.11 MIL/uL    Hemoglobin 12.7  12.0 - 15.0 g/dL    HCT 04.5  40.9 - 81.1 %    MCV 93.2  78.0 - 100.0 fL    MCH 31.0  26.0 - 34.0 pg    MCHC 33.2  30.0 - 36.0 g/dL    RDW 91.4 (*) 78.2 - 15.5 %    Platelets 121 (*) 150 - 400 K/uL    Neutrophils Relative 88 (*) 43 - 77 %    Neutro Abs 17.7 (*) 1.7 - 7.7 K/uL    Lymphocytes Relative 5 (*) 12 - 46 %    Lymphs Abs 1.0  0.7 - 4.0 K/uL    Monocytes Relative 7  3 - 12 %    Monocytes Absolute 1.5 (*) 0.1 - 1.0 K/uL    Eosinophils Relative 0  0 - 5 %    Eosinophils Absolute 0.0  0.0 - 0.7 K/uL    Basophils Relative 0  0 - 1 %    Basophils Absolute 0.0  0.0 - 0.1 K/uL   PROTIME-INR     Status: Normal   Collection Time   04/11/12  5:04 PM      Component Value Range Comment   Prothrombin Time 15.1  11.6 - 15.2 seconds    INR 1.17  0.00 - 1.49   HEPATIC FUNCTION PANEL     Status: Abnormal   Collection Time   04/11/12  5:04 PM      Component Value Range Comment   Total Protein 7.5  6.0 - 8.3 g/dL    Albumin 3.4 (*) 3.5 - 5.2 g/dL    AST 38 (*) 0 - 37 U/L    ALT 15  0 - 35 U/L    Alkaline Phosphatase 89  39 - 117 U/L    Total Bilirubin 1.2  0.3 - 1.2 mg/dL    Bilirubin, Direct 0.5 (*) 0.0 - 0.3 mg/dL    Indirect Bilirubin 0.7  0.3 - 0.9 mg/dL   LIPASE, BLOOD      Status: Normal   Collection Time   04/11/12  5:04 PM      Component Value Range Comment   Lipase 23  11 - 59 U/L   GLUCOSE, CAPILLARY     Status: Abnormal   Collection  Time   04/11/12 10:08 PM      Component Value Range Comment   Glucose-Capillary 152 (*) 70 - 99 mg/dL    Comment 1 Notify RN      Dg Chest 2 View  04/11/2012  *RADIOLOGY REPORT*  Clinical Data: Shortness of breath and weakness.  CHEST - 2 VIEW  Comparison: 02/25/2012  Findings: Stable cardiomegaly and chronic pulmonary interstitial prominence without airspace edema or pleural fluid.  No focal pulmonary consolidation is identified.  Stable degenerative changes and osteopenia of the thoracic spine.  IMPRESSION: Stable cardiomegaly and chronic pulmonary interstitial prominence.  Original Report Authenticated By: Reola Calkins, M.D.    Review of Systems  HENT: Positive for neck pain.   Eyes: Negative.   Respiratory: Positive for cough and shortness of breath.   Cardiovascular: Negative.   Gastrointestinal: Positive for nausea.  Genitourinary: Negative.   Musculoskeletal: Positive for joint pain (right shoulder pain.).       Has found skin thickening and erythema on the right thigh area.  Skin: Negative.   Neurological: Positive for weakness.       Confusion.  Psychiatric/Behavioral: Negative.     Blood pressure 129/43, pulse 71, temperature 100 F (37.8 C), temperature source Oral, resp. rate 24, SpO2 91.00%. Physical Exam  Constitutional: She is oriented to person, place, and time. She appears well-developed and well-nourished. No distress.  HENT:  Head: Normocephalic and atraumatic.  Right Ear: External ear normal.  Left Ear: External ear normal.  Nose: Nose normal.  Mouth/Throat: Oropharynx is clear and moist. No oropharyngeal exudate.  Eyes: Conjunctivae are normal. Pupils are equal, round, and reactive to light. Right eye exhibits no discharge. Left eye exhibits no discharge.  Neck: Normal range of motion.  Neck supple.  Cardiovascular: Normal rate and regular rhythm.   Respiratory: Effort normal and breath sounds normal. No respiratory distress. She has no wheezes. She has no rales.  GI: Soft. She exhibits no distension. There is no tenderness. There is no rebound.       Right side of the pannus there is erythema.  Musculoskeletal: Normal range of motion. She exhibits no edema and no tenderness.  Neurological: She is alert and oriented to person, place, and time.       Moves all extremities.  Skin: She is not diaphoretic.       Right thigh and pannus is erythematous and indurated.     Assessment/Plan: #1. SIRS most likely etiology is cellulitis of the right thigh and pannus - I have ordered blood cultures and start patient on vancomycin and Zosyn. At this time don't see any definite drainable abscess in the area of cellulitis. Closely follow for any developing a worsening lesions in the ER cellulitis.  #2. Hypoxia with history of systolic CHF and OSA - patient did receive fluids which discontinued and have ordered one dose of IV Lasix 40 mg. Closely follow patient's respiratory status. Patient refuses to wear CPAP. Closely follow intake output and daily weight and may need additional doses of Lasix based on her clinical status and metabolic panel. I have also ordered a 2-D echo to check her LV function and aortic valve. #3. Acute metabolic encephalopathy probably from #1 reason - at this time her mental status is largely improved once her fever has come down. Closely follow. Patient is nonfocal. #4. History of CAD status post CABG - denies any chest pain. Does complain of right shoulder and neck pain which daughter states is chronic. Patient does have  history of chronic LBBB. Cycle cardiac markers at this time. #5. History of it aortic valve replacement with tissue valve - see #2. #6. History of paroxysmal atrial fibrillation presently rate controlled not on Coumadin as per cardiology. #7. Chronic  thrombocytopenia followed by hematologist. #8. History of dementia and depression - at this time holding off her antidepressant and dementia medications. Can resume once her fever settles.  CODE STATUS - DO NOT RESUSCITATE as confirmed with patient's daughter Ms. Blaine Hamper. Patient's daughter can be reached at [phone].   Eduard Clos. 04/11/2012, 11:42 PM

## 2012-04-11 NOTE — ED Notes (Signed)
Assisted Larita Fife, RN with placement of foley catheter

## 2012-04-11 NOTE — ED Provider Notes (Signed)
History     CSN: [id number]  Arrival date & time 04/11/12  1625   First MD Initiated Contact with Patient 04/11/12 1631      Chief Complaint  Patient presents with  . Shortness of Breath    sob since this am; also endorses nausea; EMS states family reports increased "lethargy" as day progressed; blood glucose 172 mg/dl enroute       Patient is a 78 y.o. female presenting with shortness of breath. The history is provided by the patient and the EMS personnel. History Limited By: Hx dementia.  Shortness of Breath   Pt was seen at 1645.  Per EMS and pt, c/o gradual onset and persistence of constant generalized weakness/fatigue and SOB since this morning.  Pt states her "body aches."  EMS noted pt with low O2 Sats to 88 % R/A, improved to 97% on O2 3L N/C.  Pt denies N/V/D, denies abd pain, denies CP.    Past Medical History  Diagnosis Date  . Coronary artery disease 09/04/08    single vessel CABG at time of AVR   . Aortic stenosis 09/04/08    s/p AVR by Dr Tyrone Sage (tissue valve)  . Paroxysmal atrial fibrillation   . LBBB (left bundle branch block)   . CHF (congestive heart failure)     diastolic  . Sleep apnea   . Alzheimer's dementia   . Diabetes mellitus     type 2  . Hypertension   . Chronic anemia   . Osteoporosis   . Chronic renal failure   . Morbid obesity with BMI of 50.0-59.9, adult 03/09/2012    Past Surgical History  Procedure Date  . Coronary artery bypass graft 09/04/08    single vessel at time of AVR  . Aortic valve replacement 12/09    tissue valve by Dr Tyrone Sage  . Cholecystectomy 1995  . Cataract extraction   . Replacement total knee bilateral     L 2000, R 1997  . Appendectomy     Family History  Problem Relation Age of Onset  . Other Father 42    deceased. multiple MI   . Lung cancer Mother 90    died    History  Substance Use Topics  . Smoking status: Never Smoker   . Smokeless tobacco: Not on file  . Alcohol Use: No      Review of  Systems  Unable to perform ROS: Dementia    Allergies  Morphine and related and Celecoxib  Home Medications   Current Outpatient Rx  Name Route Sig Dispense Refill  . ACETAMINOPHEN 325 MG PO TABS Oral Take 650 mg by mouth every 6 (six) hours as needed. For pain    . AMIODARONE HCL 200 MG PO TABS Oral Take 100 mg by mouth daily.    . ASPIRIN EC 81 MG PO TBEC Oral Take 81 mg by mouth daily.    Marland Kitchen DOCUSATE SODIUM 100 MG PO CAPS Oral Take 100 mg by mouth 2 (two) times daily.     . DONEPEZIL HCL 10 MG PO TABS Oral Take 10 mg by mouth at bedtime as needed.      Marland Kitchen FERROUS SULFATE 325 (65 FE) MG PO TABS Oral Take 325 mg by mouth daily with breakfast.      . OMEGA-3 FATTY ACIDS 1000 MG PO CAPS Oral Take 1 g by mouth daily.     . FUROSEMIDE 40 MG PO TABS Oral Take 80 mg by mouth 2 (two) times  daily.     Marland Kitchen GABAPENTIN 600 MG PO TABS Oral Take 0.5 tablets (300 mg total) by mouth 2 (two) times daily. Take 1/2 tablet AM and 1 tablet at bedtime 60 tablet 0  . INSULIN GLARGINE 100 UNIT/ML Ephraim SOLN Subcutaneous Inject 14 Units into the skin at bedtime. Increasing by 2 units every 3 days until cbg gets to 120.  Increased to 14 units on 11/20/2011.    Marland Kitchen METOLAZONE 2.5 MG PO TABS Oral Take 2.5 mg by mouth once a week. On monday    . ONE-DAILY MULTI VITAMINS PO TABS Oral Take 1 tablet by mouth daily.      Marland Kitchen VITEYES AREDS ADVANCED PO CAPS Oral Take by mouth.    Marland Kitchen PAROXETINE HCL 40 MG PO TABS Oral Take 40 mg by mouth every morning.      Marland Kitchen POLYETHYLENE GLYCOL 3350 PO PACK Oral Take 17 g by mouth daily as needed. For constipation    . POTASSIUM CHLORIDE CRYS ER 20 MEQ PO TBCR Oral Take 20 mEq by mouth 2 (two) times daily.     Marland Kitchen SIMVASTATIN 10 MG PO TABS Oral Take 10 mg by mouth at bedtime.      Marland Kitchen GLUCOSE BLOOD VI STRP Other 1 each by Other route as needed. One touch test strips.  Use as instructed    . LANCET DEVICE MISC Does not apply 1 Device by Does not apply route daily as needed. Lancet thin finger sticks       BP 109/57  Pulse 63  Temp 103 F (39.4 C) (Rectal)  Resp 17  SpO2 95%  Physical Exam 1650: Physical examination:  Nursing notes reviewed; Vital signs and O2 SAT reviewed;  Constitutional: Well developed, Well nourished, In no acute distress; Head:  Normocephalic, atraumatic; Eyes: EOMI, PERRL, No scleral icterus; ENMT: Mouth and pharynx normal, Mucous membranes dry; Neck: Supple, Full range of motion, No lymphadenopathy; Cardiovascular: Regular rate and rhythm, No gallop; Respiratory: Breath sounds coarse & equal bilaterally, No wheezes.  Speaking full sentences with ease, Normal respiratory effort/excursion; Chest: Nontender, Movement normal; Abdomen: Soft, Nontender, Nondistended, Normal bowel sounds;; Extremities: Pulses normal, No tenderness, No edema, No calf edema or asymmetry.; Neuro: Awake, alert, confused re: time, place, events.  Major CN grossly intact.  Speech clear. No facial droop, moves all ext on stretcher spontaneously without apparent gross focal motor deficits.; Skin: Color normal, Warm, Dry, +red rash in folds of pannus.   ED Course  Procedures    MDM  MDM Reviewed: nursing note, vitals and previous chart Reviewed previous: ECG Interpretation: ECG, labs and x-ray Total time providing critical care: 30-74 minutes. This excludes time spent performing separately reportable procedures and services. Consults: admitting MD   CRITICAL CARE Performed by: Laray Anger Total critical care time: 35 Critical care time was exclusive of separately billable procedures and treating other patients. Critical care was necessary to treat or prevent imminent or life-threatening deterioration. Critical care was time spent personally by me on the following activities: development of treatment plan with patient and/or surrogate as well as nursing, discussions with consultants, evaluation of patient's response to treatment, examination of patient, obtaining history from patient  or surrogate, ordering and performing treatments and interventions, ordering and review of laboratory studies, ordering and review of radiographic studies, pulse oximetry and re-evaluation of patient's condition.    Date: 04/11/2012  Rate: 68  Rhythm: normal sinus rhythm  QRS Axis: left  Intervals: normal  ST/T Wave abnormalities: nonspecific ST/T changes  Conduction Disutrbances:nonspecific intraventricular conduction delay  Narrative Interpretation:   Old EKG Reviewed: unchanged; no significant changes from previous EKG dated 11/22/2011.   Results for orders placed during the hospital encounter of 04/11/12  BASIC METABOLIC PANEL      Component Value Range   Sodium 136  135 - 145 mEq/L   Potassium 4.0  3.5 - 5.1 mEq/L   Chloride 97  96 - 112 mEq/L   CO2 28  19 - 32 mEq/L   Glucose, Bld 157 (*) 70 - 99 mg/dL   BUN 21  6 - 23 mg/dL   Creatinine, Ser 1.47  0.50 - 1.10 mg/dL   Calcium 9.1  8.4 - 82.9 mg/dL   GFR calc non Af Amer 49 (*) >90 mL/min   GFR calc Af Amer 57 (*) >90 mL/min  CBC WITH DIFFERENTIAL      Component Value Range   WBC 20.3 (*) 4.0 - 10.5 K/uL   RBC 4.10  3.87 - 5.11 MIL/uL   Hemoglobin 12.7  12.0 - 15.0 g/dL   HCT 56.2  13.0 - 86.5 %   MCV 93.2  78.0 - 100.0 fL   MCH 31.0  26.0 - 34.0 pg   MCHC 33.2  30.0 - 36.0 g/dL   RDW 78.4 (*) 69.6 - 29.5 %   Platelets 121 (*) 150 - 400 K/uL   Neutrophils Relative 88 (*) 43 - 77 %   Neutro Abs 17.7 (*) 1.7 - 7.7 K/uL   Lymphocytes Relative 5 (*) 12 - 46 %   Lymphs Abs 1.0  0.7 - 4.0 K/uL   Monocytes Relative 7  3 - 12 %   Monocytes Absolute 1.5 (*) 0.1 - 1.0 K/uL   Eosinophils Relative 0  0 - 5 %   Eosinophils Absolute 0.0  0.0 - 0.7 K/uL   Basophils Relative 0  0 - 1 %   Basophils Absolute 0.0  0.0 - 0.1 K/uL  LACTIC ACID, PLASMA      Component Value Range   Lactic Acid, Venous 1.5  0.5 - 2.2 mmol/L  PROCALCITONIN      Component Value Range   Procalcitonin 2.70    URINALYSIS, ROUTINE W REFLEX MICROSCOPIC       Component Value Range   Color, Urine YELLOW  YELLOW   APPearance CLEAR  CLEAR   Specific Gravity, Urine 1.009  1.005 - 1.030   pH 6.0  5.0 - 8.0   Glucose, UA NEGATIVE  NEGATIVE mg/dL   Hgb urine dipstick NEGATIVE  NEGATIVE   Bilirubin Urine NEGATIVE  NEGATIVE   Ketones, ur NEGATIVE  NEGATIVE mg/dL   Protein, ur NEGATIVE  NEGATIVE mg/dL   Urobilinogen, UA 1.0  0.0 - 1.0 mg/dL   Nitrite NEGATIVE  NEGATIVE   Leukocytes, UA NEGATIVE  NEGATIVE  PROTIME-INR      Component Value Range   Prothrombin Time 15.1  11.6 - 15.2 seconds   INR 1.17  0.00 - 1.49   Dg Chest 2 View 04/11/2012  *RADIOLOGY REPORT*  Clinical Data: Shortness of breath and weakness.  CHEST - 2 VIEW  Comparison: 02/25/2012  Findings: Stable cardiomegaly and chronic pulmonary interstitial prominence without airspace edema or pleural fluid.  No focal pulmonary consolidation is identified.  Stable degenerative changes and osteopenia of the thoracic spine.  IMPRESSION: Stable cardiomegaly and chronic pulmonary interstitial prominence.  Original Report Authenticated By: Reola Calkins, M.D.     8:17 PM:  Family is in pt's room now.  Pt's  daughter states she has noticed pt's fungal type rash in the folds of her pannus over the past several days.  As she and I were speaking about this and rolling the pt side-to-side, we noted a new erythematous, indurated, warm area of rash to right hip, buttock, lateral pannus, and down lateral thigh which is mildly tender to palp.  No soft tissue crepitus, no ecchymosis, no fluctuance, no drainage.  Cellulitis is likely source of fever.  BC x2 obtained, UC pending.  Will start IV vancomycin.  Pt continues to mentate per baseline per family at bedside (hx dementia).  Long d/w family regarding pt's DNR/DNI status; they request to make pt DNR/DNI.  T/C to [facility] Dr. Toniann Fail, case discussed, including:  HPI, pertinent PM/SHx, VS/PE, dx testing, ED course and treatment:  Agreeable to admit, requests to  obtain stepdown bed to team 1.            Laray Anger, DO 04/12/12 2337

## 2012-04-11 NOTE — ED Notes (Signed)
Meal tray given 

## 2012-04-11 NOTE — Progress Notes (Signed)
ANTIBIOTIC CONSULT NOTE - INITIAL  Pharmacy Consult for vancomycin and zosyn Indication: cellulitis  Allergies  Allergen Reactions  . Morphine And Related     "went crazy"  . Celecoxib     Patient Measurements: weight= 107kg, height= 62 inches    Vital Signs: Temp: 101 F (38.3 C) (07/16 1908) Temp src: Rectal (07/16 1908) BP: 103/45 mmHg (07/16 1908) Pulse Rate: 63  (07/16 1820) Intake/Output from previous day:   Intake/Output from this shift:    Labs:  Basename 04/11/12 1704  WBC 20.3*  HGB 12.7  PLT 121*  LABCREA --  CREATININE 1.07   The CrCl is unknown because both a height and weight (above a minimum accepted value) are required for this calculation. No results found for this basename: VANCOTROUGH:2,VANCOPEAK:2,VANCORANDOM:2,GENTTROUGH:2,GENTPEAK:2,GENTRANDOM:2,TOBRATROUGH:2,TOBRAPEAK:2,TOBRARND:2,AMIKACINPEAK:2,AMIKACINTROU:2,AMIKACIN:2, in the last 72 hours   Microbiology: No results found for this or any previous visit (from the past 720 hour(s)).  Medical History: Past Medical History  Diagnosis Date  . Coronary artery disease 09/04/08    single vessel CABG at time of AVR   . Aortic stenosis 09/04/08    s/[name] AVR by Dr Tyrone Sage (tissue valve)  . Paroxysmal atrial fibrillation   . LBBB (left bundle branch block)   . CHF (congestive heart failure)     diastolic  . Sleep apnea   . Alzheimer's dementia   . Diabetes mellitus     type 2  . Hypertension   . Chronic anemia   . Osteoporosis   . Chronic renal failure   . Morbid obesity with BMI of 50.0-59.9, adult 03/09/2012    Medications:  See med rec   Assessment: Patient is a 78 y.o F presented to the ED with c/o SOB.  To start broad abx for fever and suspected cellulitis around right hip/buttock/thigh region. Scr 1.07 (est crcl~ 50).  Patient received vancomycin 1gm IV in the ED at ~9 PM.  Goal of Therapy:  Vancomycin trough level 10-15 mcg/ml  Plan:  1) Will give an additive vancomycin 1gm IV  now to get 2gm total for loading dose, then 1500mg  IV q24h 2) check vancomycin level at steady state 3) zosyn 3.375gm IV q8h (infuse over 4 hours)  [name] 04/11/2012,9:17 PM

## 2012-04-11 NOTE — ED Notes (Signed)
Pt obese with large pendulous abd; upon lifting abd fold, foul smell noted with moist areas noted; to rgt lateral fold, 2 small wounds noted - no drainage present; increased redness to rgt buttock extending into rgt hip area

## 2012-04-12 DIAGNOSIS — G309 Alzheimer's disease, unspecified: Secondary | ICD-10-CM

## 2012-04-12 DIAGNOSIS — I4891 Unspecified atrial fibrillation: Secondary | ICD-10-CM

## 2012-04-12 DIAGNOSIS — F05 Delirium due to known physiological condition: Secondary | ICD-10-CM

## 2012-04-12 LAB — CBC WITH DIFFERENTIAL/PLATELET
Basophils Absolute: 0 10*3/uL (ref 0.0–0.1)
Basophils Relative: 0 % (ref 0–1)
Eosinophils Absolute: 0 10*3/uL (ref 0.0–0.7)
Eosinophils Relative: 0 % (ref 0–5)
HCT: 34.9 % — ABNORMAL LOW (ref 36.0–46.0)
MCHC: 32.4 g/dL (ref 30.0–36.0)
MCV: 93.3 fL (ref 78.0–100.0)
Monocytes Absolute: 1.3 10*3/uL — ABNORMAL HIGH (ref 0.1–1.0)
RDW: 16.7 % — ABNORMAL HIGH (ref 11.5–15.5)

## 2012-04-12 LAB — COMPREHENSIVE METABOLIC PANEL
ALT: 12 U/L (ref 0–35)
Albumin: 2.8 g/dL — ABNORMAL LOW (ref 3.5–5.2)
Alkaline Phosphatase: 141 U/L — ABNORMAL HIGH (ref 39–117)
Chloride: 100 mEq/L (ref 96–112)
GFR calc Af Amer: 54 mL/min — ABNORMAL LOW (ref >90)
Glucose, Bld: 134 mg/dL — ABNORMAL HIGH (ref 70–99)
Potassium: 3.7 mEq/L (ref 3.5–5.1)
Sodium: 138 mEq/L (ref 135–145)
Total Bilirubin: 1.4 mg/dL — ABNORMAL HIGH (ref 0.3–1.2)
Total Protein: 6.6 g/dL (ref 6.0–8.3)

## 2012-04-12 LAB — GLUCOSE, CAPILLARY

## 2012-04-12 LAB — URINE CULTURE: Colony Count: NO GROWTH

## 2012-04-12 LAB — CK TOTAL AND CKMB (NOT AT ARMC)
CK, MB: 1.1 ng/mL (ref 0.3–4.0)
Relative Index: INVALID (ref 0.0–2.5)
Total CK: 41 U/L (ref 7–177)

## 2012-04-12 LAB — CARDIAC PANEL(CRET KIN+CKTOT+MB+TROPI)
Relative Index: INVALID (ref 0.0–2.5)
Troponin I: <0.3 ng/mL (ref <0.30)

## 2012-04-12 MED ORDER — SODIUM CHLORIDE 0.9 % IV SOLN: INTRAVENOUS | Status: DC

## 2012-04-12 MED ORDER — LEVOFLOXACIN IN D5W 750 MG/150ML IV SOLN
750.0000 mg | INTRAVENOUS | Status: DC
  Administered 2012-04-12: 750 mg via INTRAVENOUS
  Filled 2012-04-12 (×2): qty 150

## 2012-04-12 MED ORDER — ENOXAPARIN SODIUM 60 MG/0.6ML ~~LOC~~ SOLN
55.0000 mg | SUBCUTANEOUS | Status: DC
  Administered 2012-04-13: 55 mg via SUBCUTANEOUS
  Filled 2012-04-12 (×2): qty 0.6

## 2012-04-12 MED ORDER — CHLORHEXIDINE GLUCONATE CLOTH 2 % EX PADS
6.0000 | MEDICATED_PAD | Freq: Every day | CUTANEOUS | Status: DC
  Administered 2012-04-12 – 2012-04-14 (×3): 6 via TOPICAL

## 2012-04-12 MED ORDER — DONEPEZIL HCL 10 MG PO TABS
10.0000 mg | ORAL_TABLET | Freq: Every day | ORAL | Status: DC
  Administered 2012-04-13 – 2012-04-14 (×3): 10 mg via ORAL
  Filled 2012-04-12 (×5): qty 1

## 2012-04-12 MED ORDER — MUPIROCIN 2 % EX OINT
1.0000 "application " | TOPICAL_OINTMENT | Freq: Two times a day (BID) | CUTANEOUS | Status: DC
  Administered 2012-04-12 – 2012-04-14 (×7): 1 via NASAL
  Filled 2012-04-12 (×2): qty 22

## 2012-04-12 NOTE — Care Management Note (Signed)
    Page 1 of 1   04/12/2012     10:00:34 AM   CARE MANAGEMENT NOTE 04/12/2012  Patient:  MURLENE, REVELL   Account Number:  [id number]  Date Initiated:  04/12/2012  Documentation initiated by:  Junius Creamer  Subjective/Objective Assessment:   adm w sepsis     Action/Plan:   lives w da , da has aid that helps and friend, da to have surg on 7-18 and will be unable to provide as much care, will speak w da about needs   Anticipated DC Date:     Anticipated DC Plan:    In-house referral  Clinical Social Worker      DC Associate Professor  CM consult      Choice offered to / List presented to:             Status of service:   Medicare Important Message given?   (If response is "NO", the following Medicare IM given date fields will be blank) Date Medicare IM given:   Date Additional Medicare IM given:    Discharge Disposition:    Per UR Regulation:  Reviewed for med. necessity/level of care/duration of stay  If discussed at Long Length of Stay Meetings, dates discussed:    Comments:  7/17 9:19a debbie Lakeyta Vandenheuvel rn,bsn [phone] spoke w da ms hardy. she's having back surg on 7-18. her mom was very unsteady on 7-16. she would like short term snf for rehab prior to home. sw ref made.

## 2012-04-12 NOTE — Progress Notes (Signed)
  Echocardiogram 2D Echocardiogram has been performed.  Cathie Beams 04/12/2012, 3:53 PM

## 2012-04-12 NOTE — Progress Notes (Addendum)
[facility] Progress Note [place] TEAM 1 - Stepdown/ICU TEAM   Cj Beecher [facility] ZOX:[id number] DOB: 04/18/1933 DOA: 04/11/2012 PCP: [name], MD  Brief narrative: 78 year-old female with known history of CAD status post CABG, aortic valve replacement with tissue valve, paroxysmal atrial fibrillation not on Coumadin, systolic CHF (last EF measured was 45% in 2009), dementia, sleep apnea (noncompliant with CPAP), diabetes mellitus 2 and CKD who was brought to the ER by patient's daughter with whom patient lives as patient's daughter noticed patient getting increasingly confused and feverish since waking up in the morning.  In the ER patient was found to be febrile with temperatures around 102F. Chest x-ray and urine did not show any definite source for infection. It was noted patient skin in the right pannus and right thigh is erythematous and indurated.  Assessment/Plan:  Toxic metabolic encephalopathy (primary indication for admission) in the setting of baseline dementia - present at admission This is likely secondary to her significant febrile illness - it appears that her mental status has greatly improved - I suspect she is at her current baseline  Fever of unknown origin - possibly cellulitis of abdomen and right thigh The patient's physical exam is no longer impressive for cellulitis - her urinalysis and chest x-ray were both entirely normal and she has improved significantly with treatment for cellulitis - I will narrow her antibiotics to Levaquin plus vancomycin - we will continue to follow serial physical exams  Hypoxia This is likely driven by obesity hypoventilation syndrome and known sleep apnea in a lethargic patient - the patient's lethargy has resolved and so has her hypoxia  Chronic systolic congestive heart failure The patient's last recorded echocardiogram in 2009 revealed an ejection fraction of 45% - a repeat echo has been ordered by the admitting physician - we  will need to watch the patient's blood cultures closely and determine once more information is available if an evaluation for endocarditis as indicated  Diabetes mellitus Reasonably controlled at the present time  Obesity hypoventilation syndrome/obstructive sleep apnea Noncompliant with CPAP  History of coronary artery disease status post CABG Asymptomatic  History of chronic left bundle branch block  Status post aortic valve replacement with tissue valve There is no hemodynamic suggestion of valvular dysfunction the present time - an echo is pending  History of paroxysmal atrial fibrillation Not on chronic anticoagulation  Chronic thrombocytopenia Followed by hematology - appears to be stable at present  Code Status: DO NOT RESUSCITATE Family Communication: No family was present at the time of my exam today Disposition Plan: Stable for transfer to a telemetry bed  Consultants: None  Procedures: None  Antibiotics: Zosyn 04/11/2012>04/12/2012 Vancomycin 04/11/2012>> Levaquin 04/12/2012>>  HPI/Subjective: The patient is much more alert at the time of my exam today.  She is conversant and even oriented.  She denies specific complaints at this time.  She reports that she feels much better than she did yesterday.  She specifically denies chest pain fevers chills nausea vomiting or abdominal pain.   Objective: Blood pressure 112/46, pulse 58, temperature 98.1 F (36.7 C), temperature source Oral, resp. rate 18, height 5\' 2"  (1.575 m), weight 110.03 kg (242 lb 9.2 oz), SpO2 97.00%.  Intake/Output Summary (Last 24 hours) at 04/12/12 1325 Last data filed at 04/12/12 1100  Gross per 24 hour  Intake    600 ml  Output   1575 ml  Net   -975 ml     Exam: General: No acute respiratory distress Lungs: Very  distant breath sounds throughout due to body habitus - no wheeze or focal crackles decreased Cardiovascular: Distant heart sounds - 2/6 holosystolic murmur - no gallop or  rub Abdomen: Morbidly obese with significant pannus, nontender nondistended soft bowel sounds distant but present no rebound Cutaneous:  There is some erythema in the fold created by her upper thigh and her right pannus with minimal extension is erythema down the right anterior thigh - there is no focal area of fluctuance or appreciable abscess Extremities: Trace bilateral lower show any edema is appreciable  Data Reviewed: Basic Metabolic Panel:  Lab 04/12/12 1610 04/11/12 1704  NA 138 136  K 3.7 4.0  CL 100 97  CO2 25 28  GLUCOSE 134* 157*  BUN 23 21  CREATININE 1.11* 1.07  CALCIUM 8.6 9.1  MG -- --  PHOS -- --   Liver Function Tests:  Lab 04/12/12 0530 04/11/12 1704  AST 33 38*  ALT 12 15  ALKPHOS 141* 89  BILITOT 1.4* 1.2  PROT 6.6 7.5  ALBUMIN 2.8* 3.4*    Lab 04/11/12 1704  LIPASE 23  AMYLASE --   CBC:  Lab 04/12/12 0530 04/11/12 1704  WBC 17.7* 20.3*  NEUTROABS 15.1* 17.7*  HGB 11.3* 12.7  HCT 34.9* 38.2  MCV 93.3 93.2  PLT 100* 121*   Cardiac Enzymes:  Lab 04/12/12 0850 04/11/12 2300  CKTOTAL 42 41  CKMB 1.4 1.1  CKMBINDEX -- --  TROPONINI <0.30 <0.30   CBG:  Lab 04/12/12 1235 04/12/12 0828 04/11/12 2208  GLUCAP 135* 109* 152*    Recent Results (from the past 240 hour(s))  MRSA PCR SCREENING     Status: Abnormal   Collection Time   04/11/12 10:25 PM      Component Value Range Status Comment   MRSA by PCR POSITIVE (*) NEGATIVE Final      Studies:  Recent x-ray studies have been reviewed in detail by the Attending Physician  Scheduled Meds:  Reviewed in detail by the Attending Physician   Lonia Blood, MD [facility] Office  [phone] Pager [phone]  On-Call/Text Page:      [email or web address]      password [email or web address]  If 7PM-7AM, please contact night-coverage [email or web address] Password [email or web address] 04/12/2012, 1:25 PM   LOS: 1 day

## 2012-04-12 NOTE — Progress Notes (Addendum)
Pharmacy note: lovenox  78 yo female to start lovenox for VTE prophylaxis. Patient was on lovenox 40mg  sq daily (last dose today ~8:20am). Patient noted with wt=110mg  and SCr=1.11 (CrCl~50)  -Will give lovenox 55mg  sq q24hr (0.5mg /kg/day) and stat 04/13/12 -Will follow renal function closely -CBC every 3 days  Harland German, Pharm D 04/12/2012 2:03 PM

## 2012-04-12 NOTE — Progress Notes (Addendum)
Pt complaining of bladder fullness and inability to void. Output from foley is 150 since noon. Bladder scan showed 400 cc. MD notified. Order to irrigate foley. Will irrigate and continue to monitor. Duwaine Maxin, RN  Foley irrigated with 60 cc sterile water. About 175 total in foley bag afterward. Pt still feels like she needs to void. Will continue to monitor closely. Duwaine Maxin, RN

## 2012-04-13 DIAGNOSIS — N189 Chronic kidney disease, unspecified: Secondary | ICD-10-CM

## 2012-04-13 DIAGNOSIS — D631 Anemia in chronic kidney disease: Secondary | ICD-10-CM

## 2012-04-13 DIAGNOSIS — E860 Dehydration: Secondary | ICD-10-CM

## 2012-04-13 DIAGNOSIS — R509 Fever, unspecified: Secondary | ICD-10-CM

## 2012-04-13 LAB — BASIC METABOLIC PANEL
BUN: 23 mg/dL (ref 6–23)
Calcium: 8.8 mg/dL (ref 8.4–10.5)
Creatinine, Ser: 1.19 mg/dL — ABNORMAL HIGH (ref 0.50–1.10)
GFR calc non Af Amer: 43 mL/min — ABNORMAL LOW (ref >90)
Glucose, Bld: 71 mg/dL (ref 70–99)

## 2012-04-13 LAB — GLUCOSE, CAPILLARY
Glucose-Capillary: 63 mg/dL — ABNORMAL LOW (ref 70–99)
Glucose-Capillary: 99 mg/dL (ref 70–99)
Glucose-Capillary: 108 mg/dL — ABNORMAL HIGH (ref 70–99)
Glucose-Capillary: 119 mg/dL — ABNORMAL HIGH (ref 70–99)
Glucose-Capillary: 112 mg/dL — ABNORMAL HIGH (ref 70–99)

## 2012-04-13 LAB — CBC
HCT: 33.9 % — ABNORMAL LOW (ref 36.0–46.0)
Hemoglobin: 11 g/dL — ABNORMAL LOW (ref 12.0–15.0)
MCH: 30.8 pg (ref 26.0–34.0)
MCHC: 32.4 g/dL (ref 30.0–36.0)
MCV: 95 fL (ref 78.0–100.0)

## 2012-04-13 MED ORDER — POTASSIUM CHLORIDE CRYS ER 20 MEQ PO TBCR
40.0000 meq | EXTENDED_RELEASE_TABLET | Freq: Once | ORAL | Status: AC
  Administered 2012-04-13: 40 meq via ORAL
  Filled 2012-04-13: qty 2

## 2012-04-13 MED ORDER — INSULIN GLARGINE 100 UNIT/ML ~~LOC~~ SOLN
12.0000 [IU] | Freq: Every day | SUBCUTANEOUS | Status: DC
  Administered 2012-04-13 – 2012-04-14 (×2): 12 [IU] via SUBCUTANEOUS

## 2012-04-13 MED ORDER — POTASSIUM CHLORIDE IN NACL 20-0.9 MEQ/L-% IV SOLN
INTRAVENOUS | Status: DC
  Administered 2012-04-13 – 2012-04-14 (×3): via INTRAVENOUS
  Filled 2012-04-13 (×7): qty 1000

## 2012-04-13 MED ORDER — LEVOFLOXACIN IN D5W 750 MG/150ML IV SOLN
750.0000 mg | INTRAVENOUS | Status: DC
  Administered 2012-04-14: 750 mg via INTRAVENOUS
  Filled 2012-04-13: qty 150

## 2012-04-13 NOTE — Progress Notes (Addendum)
CSW received referral for potential disposition needs. Per chart review, pt lives at home with family. Pt recommended home health pt. Per chart review, pt has blue medicare which requires prior authorization.  CSW spoke with [employer] representative regarding physical therapy recommendations of home health with supervision/oob and pt admitting diagnosis. Per blue medicare, pt does not qualify for skilled nursing for short term rehab due to supervision/oob needs, and is a custodial reason. Per csw colleague, pt daughter is scheduled for surgery today. CSW will follow up with pt and pt daughter tomorrow.   Catha Gosselin, Theresia Majors  [phone] .04/13/2012 1455pm

## 2012-04-13 NOTE — Progress Notes (Signed)
Paged [facility] to communicate positive blood culture result( aerobic gram positive cocci ), no new order given.

## 2012-04-13 NOTE — Progress Notes (Signed)
ANTIBIOTIC CONSULT NOTE - INITIAL  Pharmacy Consult for vancomycin Indication: cellulitis  Allergies  Allergen Reactions  . Morphine And Related     "went crazy"  . Celecoxib Other (See Comments)    Sore mouth    Patient Measurements: Height: 5\' 2"  (157.5 cm) Weight: 243 lb 13.3 oz (110.6 kg) IBW/kg (Calculated) : 50.1    Vital Signs: Temp: 98 F (36.7 C) (07/18 0604) Temp src: Oral (07/18 0604) BP: 121/66 mmHg (07/18 0604) Pulse Rate: 65  (07/18 0604) Intake/Output from previous day: 07/17 0701 - 07/18 0700 In: 830 [P.O.:480; I.V.:150; IV Piggyback:200] Out: 2100 [Urine:2100] Intake/Output from this shift: Total I/O In: 480 [P.O.:480] Out: -   Labs:  Basename 04/13/12 0625 04/12/12 0530 04/11/12 1704  WBC 8.2 17.7* 20.3*  HGB 11.0* 11.3* 12.7  PLT 84* 100* 121*  LABCREA -- -- --  CREATININE 1.19* 1.11* 1.07   Estimated Creatinine Clearance: 46.4 ml/min (by C-G formula based on Cr of 1.19).  Microbiology: Recent Results (from the past 720 hour(s))  URINE CULTURE     Status: Normal   Collection Time   04/11/12  5:00 PM      Component Value Range Status Comment   Specimen Description URINE, CLEAN CATCH   Final    Special Requests NONE   Final    Culture  Setup Time 04/11/2012 17:20   Final    Colony Count NO GROWTH   Final    Culture NO GROWTH   Final    Report Status 04/12/2012 FINAL   Final   CULTURE, BLOOD (ROUTINE X 2)     Status: Normal (Preliminary result)   Collection Time   04/11/12  5:01 PM      Component Value Range Status Comment   Specimen Description BLOOD ARM RIGHT   Final    Special Requests BOTTLES DRAWN AEROBIC ONLY 10CC   Final    Culture  Setup Time 04/12/2012 03:29   Final    Culture     Final    Value: GRAM POSITIVE COCCI IN CLUSTERS     Note: Gram Stain Report Called to,Read Back By and Verified With: COLOMBO @ 0045 ON 04/13/12 BY GOLLD   Report Status PENDING   Incomplete   CULTURE, BLOOD (ROUTINE X 2)     Status: Normal  (Preliminary result)   Collection Time   04/11/12  8:10 PM      Component Value Range Status Comment   Specimen Description BLOOD HAND RIGHT   Final    Special Requests BOTTLES DRAWN AEROBIC AND ANAEROBIC 10CC   Final    Culture  Setup Time 04/12/2012 03:29   Final    Culture     Final    Value:        BLOOD CULTURE RECEIVED NO GROWTH TO DATE CULTURE WILL BE HELD FOR 5 DAYS BEFORE ISSUING A FINAL NEGATIVE REPORT   Report Status PENDING   Incomplete   MRSA PCR SCREENING     Status: Abnormal   Collection Time   04/11/12 10:25 PM      Component Value Range Status Comment   MRSA by PCR POSITIVE (*) NEGATIVE Final     Medical History: Past Medical History  Diagnosis Date  . Coronary artery disease 09/04/08    single vessel CABG at time of AVR   . Aortic stenosis 09/04/08    s/p AVR by Dr Tyrone Sage (tissue valve)  . Paroxysmal atrial fibrillation   . LBBB (left bundle branch block)   .  CHF (congestive heart failure)     diastolic  . Sleep apnea   . Alzheimer's dementia   . Diabetes mellitus     type 2  . Hypertension   . Chronic anemia   . Osteoporosis   . Chronic renal failure   . Morbid obesity with BMI of 50.0-59.9, adult 03/09/2012    Medications:  Scheduled:    . amiodarone  100 mg Oral Daily  . aspirin EC  81 mg Oral Daily  . Chlorhexidine Gluconate Cloth  6 each Topical Q0600  . docusate sodium  100 mg Oral BID  . donepezil  10 mg Oral QHS  . ferrous sulfate  325 mg Oral Q breakfast  . gabapentin  300 mg Oral BID  . insulin aspart  0-9 Units Subcutaneous TID WC  . insulin aspart  0-9 Units Subcutaneous TID WC  . insulin glargine  12 Units Subcutaneous Daily  . levofloxacin (LEVAQUIN) IV  750 mg Intravenous Q24H  . mupirocin ointment  1 application Nasal BID  . potassium chloride  40 mEq Oral Once  . simvastatin  10 mg Oral QHS  . vancomycin  1,500 mg Intravenous Q24H  . DISCONTD: enoxaparin (LOVENOX) injection  40 mg Subcutaneous Q24H  . DISCONTD: enoxaparin  (LOVENOX) injection  55 mg Subcutaneous Q24H  . DISCONTD: insulin glargine  10 Units Subcutaneous BID  . DISCONTD: piperacillin-tazobactam (ZOSYN)  IV  3.375 g Intravenous Q8H  . DISCONTD: sodium chloride  3 mL Intravenous Q12H   Assessment: Patient is a 78 year old female with improving cellulitis on right thigh . Patient  Is on day 3 of vancomycin, day 2 of levofloxaicn and completed 2 days of zosyn therapy. Patient is afebrile, WBC= 8.2. Blood cultures 1/2 indicate gram + cocci in clusters, no growth in urine culture. Renal function stable. Crcl ~45 ml/min.  Goal of Therapy:  Vanc Trough: 10-15  Plan:  1. Continue vancomycin 1500 mg Q24H 2. Will monitor renal function closely 3. Will follow culture results 4. Obtain trough at steady state on 7/20 5. Will adjust levofloxacin to 750mg  Q48H (from Q24H) due to patients crcl ~63ml/min  [name] Clinical Pharmacist Pager:[phone] Phone [phone] 04/13/2012 10:33 AM  Agree with Stacy's assessment and plan. [name], PharmD, BCPS

## 2012-04-13 NOTE — Progress Notes (Addendum)
Inpatient Diabetes Program Recommendations  AACE/ADA: New Consensus Statement on Inpatient Glycemic Control (2009)  Target Ranges:  Prepandial:   less than 140 mg/dL      Peak postprandial:   less than 180 mg/dL (1-2 hours)      Critically ill patients:  140 - 180 mg/dL   Reason for Visit: Mild hypoglycemia this am  Inpatient Diabetes Program Recommendations Insulin - Basal: Noted fasting glucose this am of 69 and 63 mg/dL.  Give 10 units lantus yesterday.  Called RN to not give the increased dose of 12 units until I can call MD for change verification.   Note: Thank you, Lenor Coffin, RN, CNS, Diabetes Coordinator [phone])   Ad.  Spoke with Dr. Laural Benes who informed me that pt got total of 20 units Lantus yesterday, therefore today's order of Lantus 12 units daily is decrease from 20 units total. Called RN back to tell her to go ahead and follow order per Dr. Laural Benes. Thank you, Dr. Laural Benes! Lenor Coffin, RN, CNS, Diabetes Coordinator [phone])

## 2012-04-13 NOTE — Progress Notes (Signed)
[facility] Progress Note  04/13/2012  Subjective: Pt says that she has chills at night.  Overall she reports that she is feeling much better.    Objective:  Vital signs in last 24 hours: Filed Vitals:   04/12/12 1600 04/12/12 1748 04/12/12 2059 04/13/12 0604  BP: 141/52 102/61 122/56 121/66  Pulse: 63 71 60 65  Temp:  99.6 F (37.6 C) 99.2 F (37.3 C) 98 F (36.7 C)  TempSrc:  Oral Oral Oral  Resp: 18 18 20 18   Height:      Weight:    110.6 kg (243 lb 13.3 oz)  SpO2: 96% 96% 92% 99%   Weight change: 0.92 kg (2 lb 0.5 oz)  Intake/Output Summary (Last 24 hours) at 04/13/12 0842 Last data filed at 04/13/12 1610  Gross per 24 hour  Intake  577.5 ml  Output   1900 ml  Net -1322.5 ml   Lab Results  Component Value Date   HGBA1C 8.1* 11/22/2011   HGBA1C  Value: 6.4 (NOTE)   The ADA recommends the following therapeutic goal for glycemic   control related to Hgb A1C measurement:   Goal of Therapy:   < 7.0% Hgb A1C   Reference: American Diabetes Association: Clinical Practice   Recommendations 2008, Diabetes Care,  2008, 31:(Suppl 1).* 08/29/2008   Lab Results  Component Value Date   LDLCALC  Value: 54        Total Cholesterol/HDL:CHD Risk Coronary Heart Disease Risk Table                     Men   Women  1/2 Average Risk   3.4   3.3 08/30/2008   CREATININE 1.19* 04/13/2012    Review of Systems As above, otherwise all reviewed and reported negative  Physical Exam General - awake, no distress, cooperative HEENT - NCAT, MMM Lungs - BBS, CTA CV - normal s1, s2 sounds w sys murmur Abd - soft, nondistended, no masses, nontender Ext - no cyanosis, no signs of infection or rash seen, edema bilateral LEs  Lab Results: Results for orders placed during the hospital encounter of 04/11/12 (from the past 24 hour(s))  CARDIAC PANEL(CRET KIN+CKTOT+MB+TROPI)     Status: Normal   Collection Time   04/12/12  8:50 AM      Component Value Range   Total CK 42  7 - 177 U/L   CK, MB  1.4  0.3 - 4.0 ng/mL   Troponin I <0.30  <0.30 ng/mL   Relative Index RELATIVE INDEX IS INVALID  0.0 - 2.5  GLUCOSE, CAPILLARY     Status: Abnormal   Collection Time   04/12/12 12:35 PM      Component Value Range   Glucose-Capillary 135 (*) 70 - 99 mg/dL  GLUCOSE, CAPILLARY     Status: Abnormal   Collection Time   04/12/12  5:05 PM      Component Value Range   Glucose-Capillary 129 (*) 70 - 99 mg/dL  GLUCOSE, CAPILLARY     Status: Abnormal   Collection Time   04/12/12  9:05 PM      Component Value Range   Glucose-Capillary 108 (*) 70 - 99 mg/dL   Comment 1 Notify RN    CBC     Status: Abnormal   Collection Time   04/13/12  6:25 AM      Component Value Range   WBC 8.2  4.0 - 10.5 K/uL   RBC 3.57 (*) 3.87 - 5.11  MIL/uL   Hemoglobin 11.0 (*) 12.0 - 15.0 g/dL   HCT 16.1 (*) 09.6 - 04.5 %   MCV 95.0  78.0 - 100.0 fL   MCH 30.8  26.0 - 34.0 pg   MCHC 32.4  30.0 - 36.0 g/dL   RDW 40.9 (*) 81.1 - 91.4 %   Platelets 84 (*) 150 - 400 K/uL  BASIC METABOLIC PANEL     Status: Abnormal   Collection Time   04/13/12  6:25 AM      Component Value Range   Sodium 140  135 - 145 mEq/L   Potassium 3.4 (*) 3.5 - 5.1 mEq/L   Chloride 103  96 - 112 mEq/L   CO2 26  19 - 32 mEq/L   Glucose, Bld 71  70 - 99 mg/dL   BUN 23  6 - 23 mg/dL   Creatinine, Ser 7.82 (*) 0.50 - 1.10 mg/dL   Calcium 8.8  8.4 - 95.6 mg/dL   GFR calc non Af Amer 43 (*) >90 mL/min   GFR calc Af Amer 50 (*) >90 mL/min  GLUCOSE, CAPILLARY     Status: Abnormal   Collection Time   04/13/12  7:27 AM      Component Value Range   Glucose-Capillary 69 (*) 70 - 99 mg/dL   Comment 1 Notify RN    GLUCOSE, CAPILLARY     Status: Abnormal   Collection Time   04/13/12  8:14 AM      Component Value Range   Glucose-Capillary 63 (*) 70 - 99 mg/dL   Comment 1 Notify RN      Micro Results: Recent Results (from the past 240 hour(s))  URINE CULTURE     Status: Normal   Collection Time   04/11/12  5:00 PM      Component Value Range Status  Comment   Specimen Description URINE, CLEAN CATCH   Final    Special Requests NONE   Final    Culture  Setup Time 04/11/2012 17:20   Final    Colony Count NO GROWTH   Final    Culture NO GROWTH   Final    Report Status 04/12/2012 FINAL   Final   CULTURE, BLOOD (ROUTINE X 2)     Status: Normal (Preliminary result)   Collection Time   04/11/12  5:01 PM      Component Value Range Status Comment   Specimen Description BLOOD ARM RIGHT   Final    Special Requests BOTTLES DRAWN AEROBIC ONLY 10CC   Final    Culture  Setup Time 04/12/2012 03:29   Final    Culture     Final    Value: GRAM POSITIVE COCCI IN CLUSTERS     Note: Gram Stain Report Called to,Read Back By and Verified With: COLOMBO @ 0045 ON 04/13/12 BY GOLLD   Report Status PENDING   Incomplete   MRSA PCR SCREENING     Status: Abnormal   Collection Time   04/11/12 10:25 PM      Component Value Range Status Comment   MRSA by PCR POSITIVE (*) NEGATIVE Final     Medications:  Scheduled Meds:   . amiodarone  100 mg Oral Daily  . aspirin EC  81 mg Oral Daily  . Chlorhexidine Gluconate Cloth  6 each Topical Q0600  . docusate sodium  100 mg Oral BID  . donepezil  10 mg Oral QHS  . enoxaparin (LOVENOX) injection  55 mg Subcutaneous Q24H  . ferrous  sulfate  325 mg Oral Q breakfast  . gabapentin  300 mg Oral BID  . insulin aspart  0-9 Units Subcutaneous TID WC  . insulin aspart  0-9 Units Subcutaneous TID WC  . insulin glargine  10 Units Subcutaneous BID  . levofloxacin (LEVAQUIN) IV  750 mg Intravenous Q24H  . mupirocin ointment  1 application Nasal BID  . simvastatin  10 mg Oral QHS  . vancomycin  1,500 mg Intravenous Q24H  . DISCONTD: enoxaparin (LOVENOX) injection  40 mg Subcutaneous Q24H  . DISCONTD: piperacillin-tazobactam (ZOSYN)  IV  3.375 g Intravenous Q8H  . DISCONTD: sodium chloride  3 mL Intravenous Q12H   Continuous Infusions:   . sodium chloride 75 mL (04/12/12 1400)   PRN Meds:.acetaminophen, acetaminophen,  ondansetron (ZOFRAN) IV, ondansetron, polyethylene glycol  Assessment/Plan: Fever of unknown origin - possibly cellulitis of abdomen and right thigh  The patient's physical exam is no longer impressive for cellulitis - her urinalysis and chest x-ray were both entirely normal and she has improved significantly with treatment for cellulitis - continue Levaquin plus vancomycin - we will continue to follow serial physical exams   Gram Positive Bacteremia Continue IV vanc and levaquin, follow C/S results  Toxic metabolic encephalopathy in the setting of baseline dementia  This is likely secondary to her significant febrile illness - it appears that her mental status has greatly improved - I suspect she is at her current baseline   Hypoxia  This is likely driven by obesity hypoventilation syndrome and known sleep apnea in a lethargic patient - the patient's lethargy has resolved and so has her hypoxia   Chronic systolic congestive heart failure  The patient's last recorded echocardiogram in 2009 revealed an ejection fraction of 45%, repeat ECHO reviewed EF 60% no mention of valve vegetations  Diabetes mellitus, type 2  Reasonably controlled at the present time   Obesity hypoventilation syndrome/obstructive sleep apnea  Noncompliant with CPAP   History of coronary artery disease status post CABG  Asymptomatic   History of chronic left bundle branch block   Status post aortic valve replacement with tissue valve  There is no hemodynamic suggestion of valvular dysfunction the present time  Hypokalemia - replace IV - check Mg  History of paroxysmal atrial fibrillation  Not on chronic anticoagulation   Chronic thrombocytopenia  Followed by hematology - platelets dropping on lovenox, will try SCDs for DVT prophylaxis    Code Status: DO NOT RESUSCITATE  Family Communication: No family was present at the time of my exam today  Disposition Plan: Stable for transfer to a telemetry bed    LOS: 2 days   Clanford Johnson 04/13/2012, 8:42 AM  Cleora Fleet, MD, CDE, FAAFP [facility] [place], [place]  [phone]

## 2012-04-13 NOTE — Evaluation (Signed)
Physical Therapy Evaluation Patient Details Name: [name] MRN: [id number] DOB: 1933/05/10 Today's Date: 04/13/2012 Time: 1132-1205 PT Time Calculation (min): 33 min  PT Assessment / Plan / Recommendation Clinical Impression  Patient presented with encephalopathy. She is close to her baseline level of function and only required assistance to stand from the toilet. She has a 3 in 1 and toilet riser she can use at home to prevent need for assistance on return home. She will benefit from PT to ensure that she can return home safely with family to decrease the burden of care since her daughter is having back surgery today.     PT Assessment  Patient needs continued PT services    Follow Up Recommendations  Home health PT;Supervision for mobility/OOB    Barriers to Discharge  None      Equipment Recommendations  None recommended by PT    Recommendations for Other Services  None  Frequency Min 3X/week    Precautions / Restrictions Precautions Precautions: Fall Restrictions Weight Bearing Restrictions: No   Pertinent Vitals/Pain VSS/ no pain      Mobility  Bed Mobility Bed Mobility: Rolling Left;Left Sidelying to Sit;Sitting - Scoot to Edge of Bed Rolling Left: 4: Min guard Left Sidelying to Sit: 4: Min guard Sitting - Scoot to [place] of Bed: 4: Min guard Details for Bed Mobility Assistance: Increased time with close guarding only - no physical assistance required. Transfers Transfers: Sit to Stand;Stand to Sit Sit to Stand: 4: Min assist;From toilet;With upper extremity assist Stand to Sit: 4: Min guard;With upper extremity assist;To chair/3-in-1 Ambulation/Gait Ambulation/Gait Assistance: 4: Min guard Ambulation Distance (Feet): 20 Feet (x 2) Assistive device: Rolling walker Ambulation/Gait Assistance Details: Patient with slow gait but does not require physical assistance with walker as support Gait Pattern: Step-through pattern;Trunk flexed;Decreased stride length    PT Diagnosis: Difficulty walking;Generalized weakness  PT Problem List: Decreased strength;Decreased activity tolerance;Decreased mobility;Pain PT Treatment Interventions: DME instruction;Gait training;Therapeutic activities;Therapeutic exercise;Patient/family education   PT Goals Acute Rehab PT Goals PT Goal Formulation: With patient Time For Goal Achievement: 04/20/12 Potential to Achieve Goals: Good Pt will go Supine/Side to Sit: with modified independence PT Goal: Supine/Side to Sit - Progress: Goal set today Pt will go Sit to Supine/Side: with modified independence PT Goal: Sit to Supine/Side - Progress: Goal set today Pt will go Sit to Stand: with modified independence;with upper extremity assist PT Goal: Sit to Stand - Progress: Goal set today Pt will go Stand to Sit: with modified independence;with upper extremity assist PT Goal: Stand to Sit - Progress: Goal set today Pt will Ambulate: with least restrictive assistive device;51 - 150 feet PT Goal: Ambulate - Progress: Goal set today  Visit Information  Last PT Received On: 04/13/12 Assistance Needed: +1    Subjective Data  Subjective: Patient reports that she is concerned about her daughter as she is having surgery today.  Patient Stated Goal: Home when daughter goes home.    Prior Functioning  Home Living Lives With: Daughter Available Help at Discharge: Family;Available PRN/intermittently;Personal care attendant;Friend(s);Neighbor (close to 24 hour care. Aide twice a week. ) Type of Home: House Home Access: Level entry Home Layout: One level Bathroom Shower/Tub: Engineer, manufacturing systems: Standard Bathroom Accessibility: Yes How Accessible: Accessible via walker (sideways) Home Adaptive Equipment: Shower chair with back;Bedside commode/3-in-1;Raised toilet seat with rails;Walker - rolling;Straight cane;Wheelchair - manual Additional Comments: Lift chair Prior Function Level of Independence: Needs  assistance Needs Assistance: Bathing;Light Housekeeping;Meal Prep Bath: Moderate (with  aide 2 x a week) Meal Prep: Total Light Housekeeping: Total Able to Take Stairs?: No Driving: No Vocation: Retired Musician: No difficulties    Cognition  Overall Cognitive Status: History of cognitive impairments - at baseline Arousal/Alertness: Awake/alert Orientation Level: Appears intact for tasks assessed Behavior During Session: [facility] for tasks performed    Extremity/Trunk Assessment Right Lower Extremity Assessment RLE ROM/Strength/Tone: Deficits RLE ROM/Strength/Tone Deficits: Grossly 4/5 RLE Coordination: WFL - gross/fine motor Left Lower Extremity Assessment LLE ROM/Strength/Tone: Deficits LLE ROM/Strength/Tone Deficits: Grossly 4/5 LLE Coordination: WFL - gross/fine motor   Balance Balance Balance Assessed: Yes Static Standing Balance Static Standing - Balance Support: No upper extremity supported Static Standing - Level of Assistance: 7: Independent Static Standing - Comment/# of Minutes: 1 minute. High Level Balance High Level Balance Activites: Turns;Side stepping;Backward walking High Level Balance Comments: close guarding with walker only.   End of Session PT - End of Session Equipment Utilized During Treatment: Gait belt Activity Tolerance: Patient tolerated treatment well Patient left: in chair;with call bell/phone within reach Nurse Communication: Mobility status   Edwyna Perfect, PT  Pager [phone]  04/13/2012, 12:16 PM

## 2012-04-14 LAB — COMPREHENSIVE METABOLIC PANEL
Alkaline Phosphatase: 82 U/L (ref 39–117)
BUN: 18 mg/dL (ref 6–23)
CO2: 26 mEq/L (ref 19–32)
Chloride: 108 mEq/L (ref 96–112)
Creatinine, Ser: 0.99 mg/dL (ref 0.50–1.10)
GFR calc non Af Amer: 54 mL/min — ABNORMAL LOW (ref >90)
Glucose, Bld: 85 mg/dL (ref 70–99)
Potassium: 3.6 mEq/L (ref 3.5–5.1)
Total Bilirubin: 0.8 mg/dL (ref 0.3–1.2)

## 2012-04-14 LAB — CBC
HCT: 32.9 % — ABNORMAL LOW (ref 36.0–46.0)
Hemoglobin: 10.6 g/dL — ABNORMAL LOW (ref 12.0–15.0)
MCV: 94.5 fL (ref 78.0–100.0)
RBC: 3.48 MIL/uL — ABNORMAL LOW (ref 3.87–5.11)
WBC: 5.2 10*3/uL (ref 4.0–10.5)

## 2012-04-14 LAB — CULTURE, BLOOD (ROUTINE X 2)

## 2012-04-14 LAB — MAGNESIUM: Magnesium: 1.9 mg/dL (ref 1.5–2.5)

## 2012-04-14 LAB — GLUCOSE, CAPILLARY
Glucose-Capillary: 98 mg/dL (ref 70–99)
Glucose-Capillary: 134 mg/dL — ABNORMAL HIGH (ref 70–99)

## 2012-04-14 LAB — TSH: TSH: 3.96 u[IU]/mL (ref 0.350–4.500)

## 2012-04-14 MED ORDER — DIPHENHYDRAMINE HCL 25 MG PO CAPS: 25.0000 mg | ORAL_CAPSULE | Freq: Four times a day (QID) | ORAL | Status: DC | PRN

## 2012-04-14 MED ORDER — SULFAMETHOXAZOLE-TRIMETHOPRIM 400-80 MG PO TABS
1.0000 | ORAL_TABLET | Freq: Two times a day (BID) | ORAL | Status: DC
  Administered 2012-04-15: 1 via ORAL
  Filled 2012-04-14 (×2): qty 1

## 2012-04-14 MED ORDER — DIPHENHYDRAMINE-ZINC ACETATE 2-0.1 % EX CREA
TOPICAL_CREAM | Freq: Two times a day (BID) | CUTANEOUS | Status: DC | PRN
  Administered 2012-04-14: 13:00:00 via TOPICAL
  Filled 2012-04-14: qty 28.4

## 2012-04-14 NOTE — Evaluation (Signed)
Occupational Therapy Evaluation Patient Details Name: [name] MRN: [id number] DOB: Feb 15, 1933 Today's Date: 04/14/2012 Time: 4098-1191 OT Time Calculation (min): 27 min  OT Assessment / Plan / Recommendation Clinical Impression  78 yo female admitted for encephalopathy and is at or near baseline of functioning. Pt does not require OT at this time acutely. Pt has all necessary DME and AE at home. Pt able to recall purpose of DME and AE.    OT Assessment  Patient does not need any further OT services    Follow Up Recommendations  No OT follow up    Barriers to Discharge      Equipment Recommendations  None recommended by OT    Recommendations for Other Services    Frequency       Precautions / Restrictions Precautions Precautions: Fall Restrictions Weight Bearing Restrictions: No   Pertinent Vitals/Pain None reported Concerned with hair loss    ADL  Eating/Feeding: Performed;Set up (lunch) Where Assessed - Eating/Feeding: Chair Grooming: Performed;Wash/dry hands;Wash/dry face;Supervision/safety Where Assessed - Grooming: Unsupported standing Lower Body Dressing: Performed;Moderate assistance (don shoes) Where Assessed - Lower Body Dressing: Supported sitting Toilet Transfer: Performed;Minimal Dentist Method: Sit to Barista: Raised toilet seat with arms (or 3-in-1 over toilet) Toileting - Clothing Manipulation and Hygiene: Performed;Minimal assistance Where Assessed - Engineer, mining and Hygiene: Sit to stand from 3-in-1 or toilet Equipment Used: Gait belt;Rolling walker Transfers/Ambulation Related to ADLs: pt ambulated Supervision level ~15 ft ADL Comments: Pt requesting to use the rest room. pt with good recall of all medical supplies and their use at home. Pt is at or near baseline of function. Pt with some short term memory deficits. Pt motivated to participate. Pt very concerned with Loss of hair. Pt  itching all over and RN notified.     OT Diagnosis:    OT Problem List:   OT Treatment Interventions:     OT Goals    Visit Information  Last OT Received On: 04/14/12 Assistance Needed: +1    Subjective Data  Subjective: "Were you here yesterday when I combed my hair?"- pt response to seeing hair in comb Patient Stated Goal: to return home with daughter   Prior Functioning  Vision/Perception  Home Living Lives With: Daughter Available Help at Discharge: Family;Available PRN/intermittently;Personal care attendant;Friend(s);Neighbor Type of Home: House Home Access: Level entry Home Layout: One level Bathroom Shower/Tub: Engineer, manufacturing systems: Standard Bathroom Accessibility: Yes How Accessible: Accessible via walker Home Adaptive Equipment: Shower chair with back;Bedside commode/3-in-1;Raised toilet seat with rails;Walker - rolling;Straight cane;Wheelchair - Medical illustrator (toilet riser) Additional Comments: Lift chair Prior Function Level of Independence: Needs assistance Needs Assistance: Bathing;Light Housekeeping;Meal Prep Bath: Moderate Meal Prep: Total Light Housekeeping: Total Able to Take Stairs?: No Driving: No Vocation: Retired Musician: No difficulties Dominant Hand: Right      Cognition  Overall Cognitive Status: History of cognitive impairments - at baseline Arousal/Alertness: Awake/alert Orientation Level: Appears intact for tasks assessed Behavior During Session: [facility] for tasks performed    Extremity/Trunk Assessment Right Upper Extremity Assessment RUE ROM/Strength/Tone: Within functional levels RUE Coordination: WFL - gross/fine motor Left Upper Extremity Assessment LUE ROM/Strength/Tone: Within functional levels LUE Coordination: WFL - gross/fine motor   Mobility Transfers Transfers: Stand to Sit;Sit to Stand Sit to Stand: 4: Min guard;With upper extremity assist;From chair/3-in-1 Stand to Sit: 4: Min guard;With  upper extremity assist;To chair/3-in-1   Exercise    Balance    End of Session OT - End  of Session Activity Tolerance: Patient tolerated treatment well Patient left: in chair;with call bell/phone within reach Nurse Communication: Mobility status  GO     Harrel Carina [facility] 04/14/2012, 1:46 PM Pager: [phone]

## 2012-04-14 NOTE — Progress Notes (Signed)
Clinical Social Work Department BRIEF PSYCHOSOCIAL ASSESSMENT 04/14/2012  Patient:  [name], [name]     Account Number:  [id number]     Admit date:  04/11/2012  Clinical Social Worker:  Dennison Bulla  Date/Time:  04/14/2012 10:30 AM  Referred by:  Physician  Date Referred:  04/14/2012 Referred for  SNF Placement   Other Referral:   Interview type:  Patient Other interview type:    PSYCHOSOCIAL DATA Living Status:  FAMILY Admitted from facility:   Level of care:   Primary support name:  [name] Primary support relationship to patient:  CHILD, ADULT Degree of support available:   Strong    CURRENT CONCERNS Current Concerns  Post-Acute Placement   Other Concerns:    SOCIAL WORK ASSESSMENT / PLAN CSW received referral to assist with dc planning. CSW was informed that insurance denied authorization for SNF stay. CSW reviewed chart and met with patient at bedside. No visitors were present.    CSW explained that insurance would not approve SNF and patient reported that she would prefer to return home anyways. Patient reports that she lives with her dtr. Patient gave CSW permission to call dtr to explain insurance. CSW attempted to call dtr but no response and unable to leave a message. CM is aware of HH needs.    CSW is signing off but available if needed.   Assessment/plan status:  No Further Intervention Required Other assessment/ plan:   Information/referral to community resources:   [facility]    PATIENT'S/FAMILY'S RESPONSE TO PLAN OF CARE: Patient was alert and oriented. Patient agreeable to returning home and reports she does not want to be admitted to a facility.

## 2012-04-14 NOTE — Progress Notes (Signed)
[facility] Progress Note  04/14/2012  Subjective: Pt says she is feeling much better. She is sitting up in a chair.  She says that her hair is falling out.  She has lost a lot of hair from back of scalp.    Objective:  Vital signs in last 24 hours: Filed Vitals:   04/13/12 1145 04/13/12 1405 04/13/12 2211 04/14/12 0500  BP:  127/71 136/82 121/71  Pulse:  63 62 67  Temp:  98.2 F (36.8 C) 98.4 F (36.9 C) 98.5 F (36.9 C)  TempSrc:  Oral    Resp:  18 18 18   Height:      Weight:    111.5 kg (245 lb 13 oz)  SpO2: 93% 100% 100% 95%   Weight change: 0.9 kg (1 lb 15.8 oz)  Intake/Output Summary (Last 24 hours) at 04/14/12 0932 Last data filed at 04/13/12 2345  Gross per 24 hour  Intake    480 ml  Output    525 ml  Net    -45 ml   Lab Results  Component Value Date   HGBA1C 8.1* 11/22/2011   HGBA1C  Value: 6.4 (NOTE)   The ADA recommends the following therapeutic goal for glycemic   control related to Hgb A1C measurement:   Goal of Therapy:   < 7.0% Hgb A1C   Reference: American Diabetes Association: Clinical Practice   Recommendations 2008, Diabetes Care,  2008, 31:(Suppl 1).* 08/29/2008   Lab Results  Component Value Date   LDLCALC  Value: 54        Total Cholesterol/HDL:CHD Risk Coronary Heart Disease Risk Table                     Men   Women  1/2 Average Risk   3.4   3.3 08/30/2008   CREATININE 0.99 04/14/2012    Review of Systems As above, otherwise all reviewed and reported negative  Physical Exam General - awake, no distress, cooperative HEENT - NCAT, MMM Lungs - BBS, CTA CV - normal s1, s2 sounds Abd - soft, nondistended, no masses, nontender Ext - no C/C/E  Lab Results: Results for orders placed during the hospital encounter of 04/11/12 (from the past 24 hour(s))  GLUCOSE, CAPILLARY     Status: Abnormal   Collection Time   04/13/12 11:41 AM      Component Value Range   Glucose-Capillary 108 (*) 70 - 99 mg/dL   Comment 1 Notify RN    GLUCOSE,  CAPILLARY     Status: Abnormal   Collection Time   04/13/12  4:41 PM      Component Value Range   Glucose-Capillary 119 (*) 70 - 99 mg/dL   Comment 1 Notify RN    GLUCOSE, CAPILLARY     Status: Abnormal   Collection Time   04/13/12 10:09 PM      Component Value Range   Glucose-Capillary 112 (*) 70 - 99 mg/dL  GLUCOSE, CAPILLARY     Status: Normal   Collection Time   04/14/12  3:02 AM      Component Value Range   Glucose-Capillary 99  70 - 99 mg/dL  CBC     Status: Abnormal   Collection Time   04/14/12  6:00 AM      Component Value Range   WBC 5.2  4.0 - 10.5 K/uL   RBC 3.48 (*) 3.87 - 5.11 MIL/uL   Hemoglobin 10.6 (*) 12.0 - 15.0 g/dL   HCT 16.1 (*)  36.0 - 46.0 %   MCV 94.5  78.0 - 100.0 fL   MCH 30.5  26.0 - 34.0 pg   MCHC 32.2  30.0 - 36.0 g/dL   RDW 82.9 (*) 56.2 - 13.0 %   Platelets 96 (*) 150 - 400 K/uL  COMPREHENSIVE METABOLIC PANEL     Status: Abnormal   Collection Time   04/14/12  6:00 AM      Component Value Range   Sodium 142  135 - 145 mEq/L   Potassium 3.6  3.5 - 5.1 mEq/L   Chloride 108  96 - 112 mEq/L   CO2 26  19 - 32 mEq/L   Glucose, Bld 85  70 - 99 mg/dL   BUN 18  6 - 23 mg/dL   Creatinine, Ser 8.65  0.50 - 1.10 mg/dL   Calcium 8.8  8.4 - 78.4 mg/dL   Total Protein 6.6  6.0 - 8.3 g/dL   Albumin 2.7 (*) 3.5 - 5.2 g/dL   AST 25  0 - 37 U/L   ALT 12  0 - 35 U/L   Alkaline Phosphatase 82  39 - 117 U/L   Total Bilirubin 0.8  0.3 - 1.2 mg/dL   GFR calc non Af Amer 54 (*) >90 mL/min   GFR calc Af Amer 62 (*) >90 mL/min  MAGNESIUM     Status: Normal   Collection Time   04/14/12  6:00 AM      Component Value Range   Magnesium 1.9  1.5 - 2.5 mg/dL  GLUCOSE, CAPILLARY     Status: Normal   Collection Time   04/14/12  7:49 AM      Component Value Range   Glucose-Capillary 97  70 - 99 mg/dL    Micro Results: Recent Results (from the past 240 hour(s))  URINE CULTURE     Status: Normal   Collection Time   04/11/12  5:00 PM      Component Value Range Status  Comment   Specimen Description URINE, CLEAN CATCH   Final    Special Requests NONE   Final    Culture  Setup Time 04/11/2012 17:20   Final    Colony Count NO GROWTH   Final    Culture NO GROWTH   Final    Report Status 04/12/2012 FINAL   Final   CULTURE, BLOOD (ROUTINE X 2)     Status: Normal (Preliminary result)   Collection Time   04/11/12  5:01 PM      Component Value Range Status Comment   Specimen Description BLOOD ARM RIGHT   Final    Special Requests BOTTLES DRAWN AEROBIC ONLY 10CC   Final    Culture  Setup Time 04/12/2012 03:29   Final    Culture     Final    Value: GRAM POSITIVE COCCI IN CLUSTERS     Note: Gram Stain Report Called to,Read Back By and Verified With: COLOMBO @ 0045 ON 04/13/12 BY GOLLD   Report Status PENDING   Incomplete   CULTURE, BLOOD (ROUTINE X 2)     Status: Normal (Preliminary result)   Collection Time   04/11/12  8:10 PM      Component Value Range Status Comment   Specimen Description BLOOD HAND RIGHT   Final    Special Requests BOTTLES DRAWN AEROBIC AND ANAEROBIC 10CC   Final    Culture  Setup Time 04/12/2012 03:29   Final    Culture     Final  Value:        BLOOD CULTURE RECEIVED NO GROWTH TO DATE CULTURE WILL BE HELD FOR 5 DAYS BEFORE ISSUING A FINAL NEGATIVE REPORT   Report Status PENDING   Incomplete   MRSA PCR SCREENING     Status: Abnormal   Collection Time   04/11/12 10:25 PM      Component Value Range Status Comment   MRSA by PCR POSITIVE (*) NEGATIVE Final     Medications:  Scheduled Meds:   . amiodarone  100 mg Oral Daily  . aspirin EC  81 mg Oral Daily  . Chlorhexidine Gluconate Cloth  6 each Topical Q0600  . docusate sodium  100 mg Oral BID  . donepezil  10 mg Oral QHS  . ferrous sulfate  325 mg Oral Q breakfast  . gabapentin  300 mg Oral BID  . insulin aspart  0-9 Units Subcutaneous TID WC  . insulin aspart  0-9 Units Subcutaneous TID WC  . insulin glargine  12 Units Subcutaneous Daily  . levofloxacin (LEVAQUIN) IV  750 mg  Intravenous Q48H  . mupirocin ointment  1 application Nasal BID  . potassium chloride  40 mEq Oral Once  . simvastatin  10 mg Oral QHS  . vancomycin  1,500 mg Intravenous Q24H  . DISCONTD: levofloxacin (LEVAQUIN) IV  750 mg Intravenous Q24H   Continuous Infusions:   . 0.9 % NaCl with KCl 20 mEq / L 100 mL/hr at 04/13/12 2130   PRN Meds:.acetaminophen, acetaminophen, ondansetron (ZOFRAN) IV, ondansetron, polyethylene glycol  Assessment/Plan: Fever of unknown origin - possibly cellulitis of abdomen and right thigh  The patient's physical exam is no longer impressive for cellulitis - her urinalysis and chest x-ray were both entirely normal and she has improved significantly with treatment for cellulitis - continue Levaquin plus vancomycin - we will continue to follow serial physical exams   Gram Positive Bacteremia  Continue IV vanc and levaquin, switch to p.o. tomorrow, follow C/S results, likely it is a contaminant.    Toxic metabolic encephalopathy in the setting of baseline dementia  REsolved now.  This is likely secondary to her significant febrile illness - it appears that her mental status has greatly improved - I suspect she is at her current baseline   Hypoxia  This is likely driven by obesity hypoventilation syndrome and known sleep apnea in a lethargic patient - the patient's lethargy has resolved and so has her hypoxia   Chronic systolic congestive heart failure  The patient's last recorded echocardiogram in 2009 revealed an ejection fraction of 45%, repeat ECHO reviewed EF 60% no mention of valve vegetations   Diabetes mellitus, type 2  Reasonably controlled at the present time   Obesity hypoventilation syndrome/obstructive sleep apnea  Noncompliant with CPAP   History of coronary artery disease status post CABG  Asymptomatic   History of chronic left bundle branch block   Status post aortic valve replacement with tissue valve  There is no hemodynamic suggestion of  valvular dysfunction the present time   Hypokalemia  - repleted IV  -  Mg WNL  Hair loss Alopecia - check TSH study today - follow up with dermatology outpatient  History of paroxysmal atrial fibrillation  Not on chronic anticoagulation   Chronic thrombocytopenia  Followed by hematology - platelets dropping on lovenox, will try SCDs for DVT prophylaxis   Code Status: DO NOT RESUSCITATE  Family Communication: No family was present at the time of my exam today  Disposition Plan: Plan  home tomorrow with HHPT    LOS: 3 days   Clanford Johnson 04/14/2012, 9:33 AM  Cleora Fleet, MD, CDE, FAAFP [facility] [place], [place]  [phone]

## 2012-04-15 DIAGNOSIS — L659 Nonscarring hair loss, unspecified: Secondary | ICD-10-CM | POA: Diagnosis present

## 2012-04-15 DIAGNOSIS — E119 Type 2 diabetes mellitus without complications: Secondary | ICD-10-CM

## 2012-04-15 DIAGNOSIS — I498 Other specified cardiac arrhythmias: Secondary | ICD-10-CM

## 2012-04-15 LAB — GLUCOSE, CAPILLARY
Glucose-Capillary: 85 mg/dL (ref 70–99)
Glucose-Capillary: 89 mg/dL (ref 70–99)

## 2012-04-15 MED ORDER — DIPHENHYDRAMINE-ZINC ACETATE 2-0.1 % EX CREA: TOPICAL_CREAM | Freq: Two times a day (BID) | CUTANEOUS | Status: DC | PRN

## 2012-04-15 MED ORDER — POTASSIUM CHLORIDE CRYS ER 20 MEQ PO TBCR: 20.0000 meq | EXTENDED_RELEASE_TABLET | Freq: Every day | ORAL | Status: DC

## 2012-04-15 MED ORDER — ACIDOPHILUS PROBIOTIC 100 MG PO CAPS: 1.0000 | ORAL_CAPSULE | Freq: Three times a day (TID) | ORAL | Status: DC

## 2012-04-15 MED ORDER — SULFAMETHOXAZOLE-TRIMETHOPRIM 800-160 MG PO TABS: 1.0000 | ORAL_TABLET | Freq: Two times a day (BID) | ORAL | Status: AC

## 2012-04-15 MED ORDER — FUROSEMIDE 40 MG PO TABS: 40.0000 mg | ORAL_TABLET | Freq: Every day | ORAL | Status: DC

## 2012-04-15 MED ORDER — INSULIN GLARGINE 100 UNIT/ML ~~LOC~~ SOLN: 20.0000 [IU] | Freq: Every day | SUBCUTANEOUS | Status: DC

## 2012-04-15 NOTE — Discharge Summary (Signed)
Physician Discharge Summary  Patient ID: [name] MRN: [id number] DOB/AGE: December 08, 1932 78 y.o.  Admit date: 04/11/2012 Discharge date: 04/15/2012  Discharge Diagnoses:    ANEMIA OF RENAL FAILURE  SLEEP APNEA, OBSTRUCTIVE, MILD  ALZHEIMERS DISEASE  HYPERTENSION, CONTROLLED  PAROXYSMAL ATRIAL FIBRILLATION  Muscle weakness (generalized)  AORTIC VALVE REPLACEMENT, HX OF  Bradycardia  Cellulitis of right leg  Alopecia  Discharged Condition: good  Hospital Course: From H&P:  78 year-old female with known history of CAD status post CABG, aortic valve replacement with tissue valve, paroxysmal atrial fibrillation not on Coumadin, systolic CHF last EF measured was 45% in 2009, dementia and who is a noncompliant with CPAP, diabetes mellitus2 and CKD was diastolic CHF brought to the ER by patient's daughter with whom patient lives as patient's daughter noticed patient getting increasingly confused and febrile since waking up in the morning yesterday. Since patient's symptoms was persistent patient was brought to the ER later in the evening. In the ER patient was found to be febrile with temperatures around 102F. Chest x-ray and urine did not show any definite source for infection. It was noted patient skin in the right pannus and right thigh is erythematous and indurated. Patient also initially was found to be hypoxic and was placed on 2 L oxygen. Patient as per daughter has been having chronic cough. Denies any diarrhea or abdominal pain the she does have nausea. But by the evening patient's appetite has improved and wants to eat something. Initially patient was confused but by the time I examined patient is more alert awake and oriented to time place and person. Patient does complain of right shoulder and neck pain which daughter states has been chronic and nothing new. Patient specifically denies any chest pain but did have some shortness of breath.   Fever /  cellulitis of abdomen and right thigh    Resolved. The patient's physical exam is no longer impressive for cellulitis - her urinalysis and chest x-ray were both entirely normal and she has improved significantly with treatment for cellulitis - continue Levaquin plus vancomycin - we will continue to follow serial physical exams   Toxic metabolic encephalopathy in the setting of baseline dementia  REsolved now. This is likely secondary to her significant febrile illness - it appears that her mental status has greatly improved - I suspect she is at her current baseline   Hypoxia  This is likely driven by obesity hypoventilation syndrome and known sleep apnea in a lethargic patient - the patient's lethargy has resolved and so has her hypoxia   Chronic systolic congestive heart failure  The patient's echocardiogram in 2009 revealed an ejection fraction of 45%, repeat ECHO 03/2012 reviewed EF 60% no mention of valve vegetations   Diabetes mellitus, type 2  Reasonably controlled at the present time, lantus was reduced and blood glucose remained controlled  Obesity hypoventilation syndrome/obstructive sleep apnea  Noncompliant with CPAP   History of coronary artery disease status post CABG  Asymptomatic   History of chronic left bundle branch block   Status post aortic valve replacement with tissue valve  There is no hemodynamic suggestion of valvular dysfunction the present time   Hypokalemia  - repleted IV  - Mg WNL   Hair loss Alopecia  -  TSH normal - follow up with dermatology outpatient   History of paroxysmal atrial fibrillation  Not on chronic anticoagulation, follow up with cardiologist Dr. Anne Fu  Chronic thrombocytopenia  Followed by hematology - platelets dropping on lovenox,  will try SCDs for DVT prophylaxis   Consults:   Discharge Exam: Pt is sitting up in a chair, no distress, eating and drinking well, no SOB, no CP, no fever or chills Blood pressure 149/74, pulse 83, temperature 98.5 F (36.9 C),  temperature source Oral, resp. rate 18, height 5\' 2"  (1.575 m), weight 113.5 kg (250 lb 3.6 oz), SpO2 93.00%. General - pt is awake, alert, no distress HEENT - NCAT, significant hairloss in occiput Neck - supple, no JVD CV - normal s1, s2 sounds Abd - soft, nondistended, no masses Ext - no cyanosis, no clubbing  Disposition: 06-Home-Health Care Svc  Home PT  Discharge Orders    Future Appointments: Provider: Department: Dept Phone: Center:   05/04/2012 9:15 AM Mariella Saa, MD [facility] [phone] None   07/19/2012 1:15 PM Rachael Fee [facility] [phone] None   07/19/2012 1:45 PM Eunice Blase, PA [facility] [phone] None   07/19/2012 2:15 PM [facility] [phone] None     Future Orders Please Complete By Expires   Diet - low sodium heart healthy      Increase activity slowly        Medication List  As of 04/15/2012  8:32 AM   TAKE these medications         acetaminophen 325 MG tablet   Commonly known as: TYLENOL   Take 650 mg by mouth every 6 (six) hours as needed. For pain      ACIDOPHILUS PROBIOTIC 100 MG Caps   Take 1 capsule (100 mg total) by mouth 3 (three) times daily with meals.      amiodarone 200 MG tablet   Commonly known as: PACERONE   Take 100 mg by mouth daily.      aspirin EC 81 MG tablet   Take 81 mg by mouth daily.      diphenhydrAMINE-zinc acetate cream   Commonly known as: BENADRYL   Apply topically 2 (two) times daily as needed for itching.      docusate sodium 100 MG capsule   Commonly known as: COLACE   Take 100 mg by mouth 2 (two) times daily.      donepezil 10 MG tablet   Commonly known as: ARICEPT   Take 10 mg by mouth at bedtime.      ferrous sulfate 325 (65 FE) MG tablet   Take 325 mg by mouth 2 (two) times daily.      fish oil-omega-3 fatty acids 1000 MG capsule   Take 1 g by mouth daily.      furosemide 40 MG tablet   Commonly known as: LASIX   Take 1 tablet (40 mg total) by mouth  daily.      gabapentin 600 MG tablet   Commonly known as: NEURONTIN   Take 300-600 mg by mouth 2 (two) times daily. Take 1/2 tab in the morning and 1 tab at bedtime      glucose blood test strip   1 each by Other route as needed. One touch test strips.  Use as instructed      insulin glargine 100 UNIT/ML injection   Commonly known as: LANTUS   Inject 20 Units into the skin daily.      Lancet Device Misc   1 Device by Does not apply route daily as needed. Lancet thin finger sticks      metolazone 2.5 MG tablet   Commonly known as: ZAROXOLYN   Take 2.5 mg by  mouth See admin instructions. Take once a week on mondays as needed if weight is >250 lbs      multivitamin tablet   Take 1 tablet by mouth daily.      OVER THE COUNTER MEDICATION   Apply 1 application topically daily as needed. Apply antifungal powder as needed. Make sure area is completely dry.      PARoxetine 40 MG tablet   Commonly known as: PAXIL   Take 40 mg by mouth every morning.      polyethylene glycol packet   Commonly known as: MIRALAX / GLYCOLAX   Take 17 g by mouth daily as needed. For constipation      potassium chloride SA 20 MEQ tablet   Commonly known as: K-DUR,KLOR-CON   Take 1 tablet (20 mEq total) by mouth daily.      simvastatin 10 MG tablet   Commonly known as: ZOCOR   Take 10 mg by mouth at bedtime.      sulfamethoxazole-trimethoprim 800-160 MG per tablet   Commonly known as: BACTRIM DS,SEPTRA DS   Take 1 tablet by mouth 2 (two) times daily.      Viteyes AREDS Advanced Caps   Take by mouth.           Follow-up Information    Follow up with [address] [facility], MD. Schedule an appointment as soon as possible for a visit in 1 week. [facility] Follow Up)    Contact information:   [address] [place] [address] [phone]       Follow up with LUPTON III, Mart Piggs, MD. Schedule an appointment as soon as possible for a visit in 1 week. (Follow up  Alopecia (Hair Loss))    Contact information:   [address] [place] [address] [phone]       Follow up with Donato Schultz, MD. Schedule an appointment as soon as possible for a visit in 2 weeks. [facility] Follow Up)    Contact information:   [address] [place] [address] [phone]       Follow up with Josph Macho, MD. Schedule an appointment as soon as possible for a visit in 2 weeks. [facility] Follow Up)    Contact information:   [address], [address] [place] [address] [phone]         I spent 40 mins preparing discharge, reconciling meds, writing prescriptions, arranging followup, etc.   Signed: Vernal Hritz 04/15/2012, 8:32 AM

## 2012-04-15 NOTE — Progress Notes (Signed)
Called prescriptions into [facility] per pt request at [phone] C. Laural Benes MD

## 2012-04-18 LAB — CULTURE, BLOOD (ROUTINE X 2)

## 2012-05-02 ENCOUNTER — Ambulatory Visit (HOSPITAL_BASED_OUTPATIENT_CLINIC_OR_DEPARTMENT_OTHER): Payer: Medicare Other | Admitting: Medical

## 2012-05-02 ENCOUNTER — Ambulatory Visit (HOSPITAL_BASED_OUTPATIENT_CLINIC_OR_DEPARTMENT_OTHER): Payer: Medicare Other | Admitting: Lab

## 2012-05-02 VITALS — BP 121/78 | HR 83 | Temp 97.0°F | Resp 24 | Ht 62.0 in | Wt 246.0 lb

## 2012-05-02 DIAGNOSIS — D649 Anemia, unspecified: Secondary | ICD-10-CM

## 2012-05-02 DIAGNOSIS — D631 Anemia in chronic kidney disease: Secondary | ICD-10-CM

## 2012-05-02 DIAGNOSIS — N289 Disorder of kidney and ureter, unspecified: Secondary | ICD-10-CM

## 2012-05-02 LAB — CBC WITH DIFFERENTIAL (CANCER CENTER ONLY)
Eosinophils Absolute: 0.1 10*3/uL (ref 0.0–0.5)
LYMPH%: 17.4 % (ref 14.0–48.0)
MCH: 30.9 pg (ref 26.0–34.0)
MCHC: 31.8 g/dL — ABNORMAL LOW (ref 32.0–36.0)
MCV: 97 fL (ref 81–101)
MONO%: 11.3 % (ref 0.0–13.0)
Platelets: 118 10*3/uL — ABNORMAL LOW (ref 145–400)
RBC: 3.59 10*6/uL — ABNORMAL LOW (ref 3.70–5.32)
RDW: 16 % — ABNORMAL HIGH (ref 11.1–15.7)

## 2012-05-02 NOTE — Progress Notes (Signed)
Patient Name : [name], [name] MR #[id number] DOB: 10-03-1932 Encounter Date: 05/02/2012 Dictated by Eunice Blase, PA-C  Diagnosis: Anemia of renal insufficiency.  Current therapy Aranesp 300 mcg subcutaneous as needed.  For hemoglobin less than 11.  Interim history: [name] presents today after being admitted in the hospital from 04/11/2012- 04/15/2012.  She was admitted do to cellulitis of the right leg.  She was given IV antibiotics, and over the course of her hospital stay.  She significantly improved and was discharged on by mouth antibiotics.  The cellulitis of her right leg has resolved.  We recently just saw [name], at which time she did not need an Aranesp injection as her hemoglobin was 12.1.  We really try and limit Aranesp for [name] and all and give it to her for a hemoglobin of less than 11.  Today.  Her hemoglobin is 11.1.  As such, we will hold off.  The last ferritin level.  We obtained on her on 04/03/2012 was 244, her iron at that time, was 72, with 23% saturation.  She denies any nausea, vomiting.  She's not had any fevers, chills, or night sweats.  She denies any cough, chest pain, or increasing, shortness of breath.  She is having a problem with alopecia, which started during her hospital visit.  She has an office visit with the dermatologist.  Overall, her performance status is probably around an ECOG 3.  Review of Systems:Significant for alopecia, otherwise: Pt. Denies any changes in their vision, hearing, adenopathy, fevers, chills, nausea, vomiting, diarrhea, constipation, chest pain, shortness of breath, passing blood, passing out, blacking out,  any changes in skin, joints, neurologic or psychiatric except as noted.  Physical Exam: This is a 78 year old, obese, white, elderly, female, in no obvious distress Vitals: Temperature 97.0 degrees, pulse 83, respirations 24, blood pressure 121/78, weight 246 pounds HEENT reveals a normocephalic, atraumatic  skull, no scleral icterus, no oral lesions  Neck is supple without any cervical or supraclavicular adenopathy.  Lungs are clear to auscultation bilaterally. There are no wheezes, rales or rhonci Cardiac is regular rate and rhythm with a normal S1 and S2. 2/6 systolic ejection murmur. Abdomen is soft with good bowel sounds there's a palpable mass. There is no palpable hepatosplenomegaly. There is no palpable fluid wave.  Musculoskeletal no tenderness of the spine, ribs, or hips.  Extremities there are no clubbing, cyanosis, or edema.  Skin no petechia, purpura or ecchymosis Neurologic is nonfocal.  Laboratory Data: White count 5.2, hemoglobin 1.1, hematocrit 34.9, platelets 118,000 Ferritin, and iron level pending from today  Current Outpatient Prescriptions on File Prior to Visit  Medication Sig Dispense Refill  . acetaminophen (TYLENOL) 325 MG tablet Take 650 mg by mouth every 6 (six) hours as needed. For pain      . amiodarone (PACERONE) 200 MG tablet Take 100 mg by mouth daily.      Marland Kitchen aspirin EC 81 MG tablet Take 81 mg by mouth daily.      . diphenhydrAMINE-zinc acetate (BENADRYL) cream Apply topically 2 (two) times daily as needed for itching.  30 g  0  . docusate sodium (COLACE) 100 MG capsule Take 100 mg by mouth 2 (two) times daily.       Marland Kitchen donepezil (ARICEPT) 10 MG tablet Take 10 mg by mouth at bedtime.      . ferrous sulfate 325 (65 FE) MG tablet Take 325 mg by mouth 2 (two) times daily.      Marland Kitchen  fish oil-omega-3 fatty acids 1000 MG capsule Take 1 g by mouth daily.       . furosemide (LASIX) 40 MG tablet Take 1 tablet (40 mg total) by mouth daily.  30 tablet  0  . gabapentin (NEURONTIN) 600 MG tablet Take 300-600 mg by mouth 2 (two) times daily. Take 1/2 tab in the morning and 1 tab at bedtime      . glucose blood test strip 1 each by Other route as needed. One touch test strips.  Use as instructed      . insulin glargine (LANTUS) 100 UNIT/ML injection Inject 20 Units into the skin  daily.  10 mL  0  . Lactobacillus (ACIDOPHILUS PROBIOTIC) 100 MG CAPS Take 1 capsule (100 mg total) by mouth 3 (three) times daily with meals.  90 capsule  0  . Lancet Device MISC 1 Device by Does not apply route daily as needed. Lancet thin finger sticks      . metolazone (ZAROXOLYN) 2.5 MG tablet Take 2.5 mg by mouth See admin instructions. Take once a week on mondays as needed if weight is >250 lbs      . Multiple Vitamin (MULTIVITAMIN) tablet Take 1 tablet by mouth daily.        Marland Kitchen PARoxetine (PAXIL) 40 MG tablet Take 40 mg by mouth every morning.        . polyethylene glycol (MIRALAX / GLYCOLAX) packet Take 17 g by mouth daily as needed. For constipation      . potassium chloride SA (K-DUR,KLOR-CON) 20 MEQ tablet Take 1 tablet (20 mEq total) by mouth daily.  30 tablet  0  . simvastatin (ZOCOR) 10 MG tablet Take 10 mg by mouth at bedtime.         Assessment/Plan: This is a pleasant, 78 year old, elderly, white female, with the following issues: #1 anemia of renal insufficiency-she is on numerous medications.  We are being conservative with our Aranesp administration.  Her hemoglobin is over 11 today as, such, we'll hold off on Aranesp.  Her platelet count was dropping some 2 weeks [name], at 96,000, today.  It is come back up to 118,000.  We will continue to monitor her platelet count.  This is probably medication related more than anything else.  #2 hospital admission for cellulitis of the right leg-resolved.  #3 followup [name] will follow back up with [place] in October, but before then should there be questions or concerns.

## 2012-05-04 ENCOUNTER — Encounter (INDEPENDENT_AMBULATORY_CARE_PROVIDER_SITE_OTHER): Payer: Medicare Other | Admitting: General Surgery

## 2012-05-25 ENCOUNTER — Ambulatory Visit (HOSPITAL_COMMUNITY): Admission: RE | Admit: 2012-05-25 | Payer: Medicare Other | Source: Ambulatory Visit | Admitting: Surgery

## 2012-05-25 ENCOUNTER — Encounter (HOSPITAL_COMMUNITY): Admission: RE | Payer: Self-pay | Source: Ambulatory Visit

## 2012-05-25 SURGERY — TRANSANAL HEMORRHOIDAL DEARTERIALIZATION
Anesthesia: General

## 2012-07-15 ENCOUNTER — Other Ambulatory Visit: Payer: Self-pay | Admitting: Hematology & Oncology

## 2012-07-19 ENCOUNTER — Other Ambulatory Visit: Payer: Medicare Other | Admitting: Lab

## 2012-07-19 ENCOUNTER — Ambulatory Visit: Payer: Medicare Other | Admitting: Medical

## 2012-07-19 ENCOUNTER — Ambulatory Visit: Payer: Medicare Other

## 2012-07-19 ENCOUNTER — Telehealth: Payer: Self-pay | Admitting: Hematology & Oncology

## 2012-07-19 NOTE — Telephone Encounter (Signed)
Pt cx 10-23 rescheduled for 10-30 she is sick

## 2012-07-26 ENCOUNTER — Other Ambulatory Visit (HOSPITAL_BASED_OUTPATIENT_CLINIC_OR_DEPARTMENT_OTHER): Payer: Medicare Other | Admitting: Lab

## 2012-07-26 ENCOUNTER — Ambulatory Visit: Payer: Medicare Other

## 2012-07-26 ENCOUNTER — Ambulatory Visit (HOSPITAL_BASED_OUTPATIENT_CLINIC_OR_DEPARTMENT_OTHER): Payer: Medicare Other | Admitting: Medical

## 2012-07-26 VITALS — BP 99/69 | HR 97 | Temp 98.1°F | Resp 18 | Ht 62.0 in | Wt 262.0 lb

## 2012-07-26 DIAGNOSIS — D631 Anemia in chronic kidney disease: Secondary | ICD-10-CM

## 2012-07-26 DIAGNOSIS — D649 Anemia, unspecified: Secondary | ICD-10-CM

## 2012-07-26 DIAGNOSIS — D490 Neoplasm of unspecified behavior of digestive system: Secondary | ICD-10-CM

## 2012-07-26 DIAGNOSIS — E1169 Type 2 diabetes mellitus with other specified complication: Secondary | ICD-10-CM

## 2012-07-26 DIAGNOSIS — N039 Chronic nephritic syndrome with unspecified morphologic changes: Secondary | ICD-10-CM

## 2012-07-26 DIAGNOSIS — N289 Disorder of kidney and ureter, unspecified: Secondary | ICD-10-CM

## 2012-07-26 DIAGNOSIS — N189 Chronic kidney disease, unspecified: Secondary | ICD-10-CM

## 2012-07-26 LAB — CBC WITH DIFFERENTIAL (CANCER CENTER ONLY)
BASO%: 1.2 % (ref 0.0–2.0)
LYMPH%: 16.4 % (ref 14.0–48.0)
MCV: 97 fL (ref 81–101)
MONO#: 0.7 10*3/uL (ref 0.1–0.9)
MONO%: 14.1 % — ABNORMAL HIGH (ref 0.0–13.0)
Platelets: 113 10*3/uL — ABNORMAL LOW (ref 145–400)
RDW: 16.1 % — ABNORMAL HIGH (ref 11.1–15.7)
WBC: 5 10*3/uL (ref 3.9–10.0)

## 2012-07-26 NOTE — Progress Notes (Signed)
Diagnosis: Anemia, renal insufficiency.  Current therapy: Aranesp 300 mcg subcutaneous as needed.  For hemoglobin less than 11.  Interim history: Ms. Borgwardt presents today for an office followup visit.  Her daughter accompanies her.  Ms. Felling, reports, that a few weeks ago.  She had an ulcer type area show up on the left lateral side of her tongue.  She reports, that she has an appointment with Dr. Pollyann Kennedy this week or next week.  I did tell.  Ms. Poirier, that that particular type of lesion really needs to be biopsied to rule out malignancy.  She otherwise does not report any new problems or changes in her medication although she is on a slew of medications.  Her hemoglobin is still holding steady at 11.9.  Today, she will not receive an Aranesp injection.  We are checking, her iron and ferritin level today.  She denies any nausea, vomiting, diarrhea, constipation.  She reports, she has a decent appetite.  She denies any cough, chest pain, or increasing, shortness of breath.  She denies any obvious, or abnormal bleeding.  She does have some chronic lower leg swelling.  For the most part she is sedentary and does not get around very well.  Overall, her ECOG performance status is a 3.  Review of Systems: lesion on left lateral side of tongue: Pt. Denies any changes in their vision, hearing, adenopathy, fevers, chills, nausea, vomiting, diarrhea, constipation, chest pain, shortness of breath, passing blood, passing out, blacking out,  any changes in skin, joints, neurologic or psychiatric except as noted.  Physical Exam: This is a pleasant, 78 year old, obese, white, elderly, female, in no obvious distress Vitals: Temperature 90.1 degrees, pulse 97, respirations 18, blood pressure 99/69, weight 262 pounds HEENT reveals a normocephalic, atraumatic skull, no scleral icterus, she does have what appears to be an ulcerated lesion on the left lateral side of her tongue Neck is supple without any cervical or  supraclavicular adenopathy.  Lungs are clear to auscultation bilaterally. There are no wheezes, rales or rhonci Cardiac is regular rate and rhythm with a normal S1 and S2. There are no murmurs, rubs, or bruits.  Abdomen is soft with good bowel sounds, there is no palpable mass. There is no palpable hepatosplenomegaly. There is no palpable fluid wave.  Musculoskeletal no tenderness of the spine, ribs, or hips.  Extremities there are no clubbing, cyanosis, chronic non-pitting edema of L/E Skin no petechia, purpura or ecchymosis Neurologic is nonfocal.  Laboratory Data: White count 5.0, hemoglobin 11.9, hematocrit 37.3, platelets 113,000  Current Outpatient Prescriptions on File Prior to Visit  Medication Sig Dispense Refill  . acetaminophen (TYLENOL) 325 MG tablet Take 650 mg by mouth every 6 (six) hours as needed. For pain      . amiodarone (PACERONE) 200 MG tablet Take 100 mg by mouth daily.      Marland Kitchen aspirin EC 81 MG tablet Take 81 mg by mouth daily.      Marland Kitchen CIPRODEX otic suspension       . diphenhydrAMINE-zinc acetate (BENADRYL) cream Apply topically 2 (two) times daily as needed for itching.  30 g  0  . docusate sodium (COLACE) 100 MG capsule Take 100 mg by mouth 2 (two) times daily.       Marland Kitchen donepezil (ARICEPT) 10 MG tablet Take 10 mg by mouth at bedtime.      . ferrous sulfate 325 (65 FE) MG tablet Take 325 mg by mouth 2 (two) times daily.      Marland Kitchen  fish oil-omega-3 fatty acids 1000 MG capsule Take 1 g by mouth daily.       . furosemide (LASIX) 40 MG tablet Take 1 tablet (40 mg total) by mouth daily.  30 tablet  0  . gabapentin (NEURONTIN) 600 MG tablet Take 300-600 mg by mouth 2 (two) times daily. Take 1/2 tab in the morning and 1 tab at bedtime      . glucose blood test strip 1 each by Other route as needed. One touch test strips.  Use as instructed      . insulin glargine (LANTUS) 100 UNIT/ML injection Inject 20 Units into the skin daily.  10 mL  0  . Lactobacillus (ACIDOPHILUS PROBIOTIC)  100 MG CAPS Take 1 capsule (100 mg total) by mouth 3 (three) times daily with meals.  90 capsule  0  . Lancet Device MISC 1 Device by Does not apply route daily as needed. Lancet thin finger sticks      . metolazone (ZAROXOLYN) 2.5 MG tablet Take 2.5 mg by mouth See admin instructions. Take once a week on mondays as needed if weight is >250 lbs      . metoprolol tartrate (LOPRESSOR) 25 MG tablet       . Multiple Vitamin (MULTIVITAMIN) tablet Take 1 tablet by mouth daily.        Marland Kitchen PARoxetine (PAXIL) 40 MG tablet Take 40 mg by mouth every morning.        . polyethylene glycol (MIRALAX / GLYCOLAX) packet Take 17 g by mouth daily as needed. For constipation      . potassium chloride SA (K-DUR,KLOR-CON) 20 MEQ tablet Take 1 tablet (20 mEq total) by mouth daily.  30 tablet  0  . PROCTOZONE-HC 2.5 % rectal cream       . simvastatin (ZOCOR) 10 MG tablet Take 10 mg by mouth at bedtime.        . valACYclovir (VALTREX) 500 MG tablet        Assessment/Plan: This is a pleasant, 78 year old, elderly, white female, with the following issues:  #1 anemia of renal insufficiency.  Her hemoglobin looks good today.  She does not need an Aranesp injection.  We are being conservative with our Aranesp administration.  We will continue to monitor her labs closely.  #2 ulcerated lesion of the left lateral tongue.  Ms. Gluck will followup with Dr. Pollyann Kennedy either this week or next week for biopsy.  #3 followup.  Ms. Threats will follow back up with [place] in 2 months, but before then should there be questions or concerns.

## 2012-07-27 LAB — RETICULOCYTES (CHCC)
ABS Retic: 75.1 10*3/uL (ref 19.0–186.0)
RBC.: 3.95 MIL/uL (ref 3.87–5.11)

## 2012-07-27 LAB — IRON AND TIBC: Iron: 71 ug/dL (ref 42–145)

## 2012-07-28 ENCOUNTER — Inpatient Hospital Stay (HOSPITAL_COMMUNITY)
Admission: EM | Admit: 2012-07-28 | Discharge: 2012-08-04 | DRG: 291 | Disposition: A | Discharge status: Skilled Nursing Facility | Payer: Medicare Other | Attending: Internal Medicine | Admitting: Internal Medicine

## 2012-07-28 ENCOUNTER — Other Ambulatory Visit: Payer: Self-pay

## 2012-07-28 ENCOUNTER — Emergency Department (HOSPITAL_COMMUNITY): Payer: Medicare Other

## 2012-07-28 DIAGNOSIS — K648 Other hemorrhoids: Secondary | ICD-10-CM

## 2012-07-28 DIAGNOSIS — I2510 Atherosclerotic heart disease of native coronary artery without angina pectoris: Secondary | ICD-10-CM

## 2012-07-28 DIAGNOSIS — I129 Hypertensive chronic kidney disease with stage 1 through stage 4 chronic kidney disease, or unspecified chronic kidney disease: Secondary | ICD-10-CM | POA: Diagnosis present

## 2012-07-28 DIAGNOSIS — D631 Anemia in chronic kidney disease: Secondary | ICD-10-CM

## 2012-07-28 DIAGNOSIS — I4891 Unspecified atrial fibrillation: Secondary | ICD-10-CM

## 2012-07-28 DIAGNOSIS — Z6841 Body Mass Index (BMI) 40.0 and over, adult: Secondary | ICD-10-CM

## 2012-07-28 DIAGNOSIS — K140 Glossitis: Secondary | ICD-10-CM

## 2012-07-28 DIAGNOSIS — I10 Essential (primary) hypertension: Secondary | ICD-10-CM

## 2012-07-28 DIAGNOSIS — Z96659 Presence of unspecified artificial knee joint: Secondary | ICD-10-CM

## 2012-07-28 DIAGNOSIS — I248 Other forms of acute ischemic heart disease: Secondary | ICD-10-CM | POA: Diagnosis present

## 2012-07-28 DIAGNOSIS — R7989 Other specified abnormal findings of blood chemistry: Secondary | ICD-10-CM

## 2012-07-28 DIAGNOSIS — L03115 Cellulitis of right lower limb: Secondary | ICD-10-CM

## 2012-07-28 DIAGNOSIS — D696 Thrombocytopenia, unspecified: Secondary | ICD-10-CM | POA: Diagnosis present

## 2012-07-28 DIAGNOSIS — N189 Chronic kidney disease, unspecified: Secondary | ICD-10-CM | POA: Diagnosis present

## 2012-07-28 DIAGNOSIS — R001 Bradycardia, unspecified: Secondary | ICD-10-CM

## 2012-07-28 DIAGNOSIS — N039 Chronic nephritic syndrome with unspecified morphologic changes: Secondary | ICD-10-CM

## 2012-07-28 DIAGNOSIS — I447 Left bundle-branch block, unspecified: Secondary | ICD-10-CM | POA: Diagnosis present

## 2012-07-28 DIAGNOSIS — Z79899 Other long term (current) drug therapy: Secondary | ICD-10-CM

## 2012-07-28 DIAGNOSIS — F05 Delirium due to known physiological condition: Secondary | ICD-10-CM

## 2012-07-28 DIAGNOSIS — F330 Major depressive disorder, recurrent, mild: Secondary | ICD-10-CM

## 2012-07-28 DIAGNOSIS — I5043 Acute on chronic combined systolic (congestive) and diastolic (congestive) heart failure: Principal | ICD-10-CM | POA: Diagnosis present

## 2012-07-28 DIAGNOSIS — G309 Alzheimer's disease, unspecified: Secondary | ICD-10-CM | POA: Diagnosis present

## 2012-07-28 DIAGNOSIS — L659 Nonscarring hair loss, unspecified: Secondary | ICD-10-CM

## 2012-07-28 DIAGNOSIS — M6281 Muscle weakness (generalized): Secondary | ICD-10-CM

## 2012-07-28 DIAGNOSIS — Z954 Presence of other heart-valve replacement: Secondary | ICD-10-CM

## 2012-07-28 DIAGNOSIS — E1149 Type 2 diabetes mellitus with other diabetic neurological complication: Secondary | ICD-10-CM

## 2012-07-28 DIAGNOSIS — Z794 Long term (current) use of insulin: Secondary | ICD-10-CM

## 2012-07-28 DIAGNOSIS — Z952 Presence of prosthetic heart valve: Secondary | ICD-10-CM

## 2012-07-28 DIAGNOSIS — I5031 Acute diastolic (congestive) heart failure: Secondary | ICD-10-CM

## 2012-07-28 DIAGNOSIS — R000 Tachycardia, unspecified: Secondary | ICD-10-CM | POA: Diagnosis not present

## 2012-07-28 DIAGNOSIS — E119 Type 2 diabetes mellitus without complications: Secondary | ICD-10-CM

## 2012-07-28 DIAGNOSIS — Z951 Presence of aortocoronary bypass graft: Secondary | ICD-10-CM

## 2012-07-28 DIAGNOSIS — J9600 Acute respiratory failure, unspecified whether with hypoxia or hypercapnia: Secondary | ICD-10-CM | POA: Diagnosis present

## 2012-07-28 DIAGNOSIS — Z66 Do not resuscitate: Secondary | ICD-10-CM | POA: Diagnosis present

## 2012-07-28 DIAGNOSIS — I2489 Other forms of acute ischemic heart disease: Secondary | ICD-10-CM | POA: Diagnosis present

## 2012-07-28 DIAGNOSIS — E876 Hypokalemia: Secondary | ICD-10-CM

## 2012-07-28 DIAGNOSIS — F329 Major depressive disorder, single episode, unspecified: Secondary | ICD-10-CM | POA: Diagnosis present

## 2012-07-28 DIAGNOSIS — E785 Hyperlipidemia, unspecified: Secondary | ICD-10-CM

## 2012-07-28 DIAGNOSIS — I509 Heart failure, unspecified: Secondary | ICD-10-CM

## 2012-07-28 DIAGNOSIS — F0280 Dementia in other diseases classified elsewhere without behavioral disturbance: Secondary | ICD-10-CM

## 2012-07-28 DIAGNOSIS — G4733 Obstructive sleep apnea (adult) (pediatric): Secondary | ICD-10-CM

## 2012-07-28 DIAGNOSIS — R5381 Other malaise: Secondary | ICD-10-CM | POA: Diagnosis present

## 2012-07-28 DIAGNOSIS — F3289 Other specified depressive episodes: Secondary | ICD-10-CM | POA: Diagnosis present

## 2012-07-28 DIAGNOSIS — E1142 Type 2 diabetes mellitus with diabetic polyneuropathy: Secondary | ICD-10-CM | POA: Diagnosis present

## 2012-07-28 LAB — CBC WITH DIFFERENTIAL/PLATELET
Eosinophils Absolute: 0.1 10*3/uL (ref 0.0–0.7)
Eosinophils Relative: 2 % (ref 0–5)
HCT: 40.2 % (ref 36.0–46.0)
Hemoglobin: 12.8 g/dL (ref 12.0–15.0)
Lymphocytes Relative: 17 % (ref 12–46)
Lymphs Abs: 1 10*3/uL (ref 0.7–4.0)
MCH: 30.5 pg (ref 26.0–34.0)
MCV: 95.9 fL (ref 78.0–100.0)
Monocytes Absolute: 0.9 10*3/uL (ref 0.1–1.0)
Monocytes Relative: 16 % — ABNORMAL HIGH (ref 3–12)
Platelets: 121 10*3/uL — ABNORMAL LOW (ref 150–400)
RBC: 4.19 MIL/uL (ref 3.87–5.11)
WBC: 5.5 10*3/uL (ref 4.0–10.5)

## 2012-07-28 LAB — CREATININE, SERUM: Creatinine, Ser: 1.27 mg/dL — ABNORMAL HIGH (ref 0.50–1.10)

## 2012-07-28 LAB — CBC
Hemoglobin: 13.1 g/dL (ref 12.0–15.0)
MCH: 31 pg (ref 26.0–34.0)
MCHC: 32.3 g/dL (ref 30.0–36.0)
MCV: 96.2 fL (ref 78.0–100.0)
Platelets: 121 10*3/uL — ABNORMAL LOW (ref 150–400)
RBC: 4.22 MIL/uL (ref 3.87–5.11)

## 2012-07-28 LAB — BASIC METABOLIC PANEL
BUN: 23 mg/dL (ref 6–23)
CO2: 30 mEq/L (ref 19–32)
Calcium: 9.6 mg/dL (ref 8.4–10.5)
Creatinine, Ser: 1.34 mg/dL — ABNORMAL HIGH (ref 0.50–1.10)
GFR calc non Af Amer: 37 mL/min — ABNORMAL LOW (ref >90)
Glucose, Bld: 171 mg/dL — ABNORMAL HIGH (ref 70–99)
Sodium: 138 mEq/L (ref 135–145)

## 2012-07-28 LAB — MAGNESIUM: Magnesium: 2.1 mg/dL (ref 1.5–2.5)

## 2012-07-28 LAB — PHOSPHORUS: Phosphorus: 3.3 mg/dL (ref 2.3–4.6)

## 2012-07-28 LAB — PRO B NATRIURETIC PEPTIDE: Pro B Natriuretic peptide (BNP): 5777 pg/mL — ABNORMAL HIGH (ref 0–450)

## 2012-07-28 LAB — POCT I-STAT TROPONIN I: Troponin i, poc: 0.11 ng/mL (ref 0.00–0.08)

## 2012-07-28 LAB — PROTIME-INR: INR: 1.23 (ref 0.00–1.49)

## 2012-07-28 LAB — GLUCOSE, CAPILLARY: Glucose-Capillary: 174 mg/dL — ABNORMAL HIGH (ref 70–99)

## 2012-07-28 MED ORDER — DONEPEZIL HCL 10 MG PO TABS
10.0000 mg | ORAL_TABLET | Freq: Every day | ORAL | Status: DC
  Administered 2012-07-28 – 2012-08-03 (×7): 10 mg via ORAL
  Filled 2012-07-28 (×9): qty 1

## 2012-07-28 MED ORDER — GABAPENTIN 300 MG PO CAPS
600.0000 mg | ORAL_CAPSULE | Freq: Every day | ORAL | Status: DC
  Administered 2012-07-28 – 2012-08-03 (×7): 600 mg via ORAL
  Filled 2012-07-28 (×9): qty 2

## 2012-07-28 MED ORDER — SODIUM CHLORIDE 0.9 % IV SOLN: 250.0000 mL | INTRAVENOUS | Status: DC | PRN

## 2012-07-28 MED ORDER — GABAPENTIN 300 MG PO CAPS
300.0000 mg | ORAL_CAPSULE | Freq: Every day | ORAL | Status: DC
  Administered 2012-07-29 – 2012-08-04 (×7): 300 mg via ORAL
  Filled 2012-07-28 (×8): qty 1

## 2012-07-28 MED ORDER — ASPIRIN EC 81 MG PO TBEC
81.0000 mg | DELAYED_RELEASE_TABLET | Freq: Every day | ORAL | Status: DC
  Administered 2012-07-28 – 2012-08-04 (×8): 81 mg via ORAL
  Filled 2012-07-28 (×8): qty 1

## 2012-07-28 MED ORDER — AMIODARONE HCL 100 MG PO TABS
100.0000 mg | ORAL_TABLET | Freq: Every day | ORAL | Status: DC
  Administered 2012-07-28 – 2012-08-03 (×7): 100 mg via ORAL
  Filled 2012-07-28 (×7): qty 1

## 2012-07-28 MED ORDER — PAROXETINE HCL 20 MG PO TABS
40.0000 mg | ORAL_TABLET | ORAL | Status: DC
  Administered 2012-07-29 – 2012-08-04 (×7): 40 mg via ORAL
  Filled 2012-07-28 (×8): qty 2

## 2012-07-28 MED ORDER — FUROSEMIDE 10 MG/ML IJ SOLN
80.0000 mg | Freq: Three times a day (TID) | INTRAMUSCULAR | Status: DC
  Administered 2012-07-29 – 2012-08-03 (×16): 80 mg via INTRAVENOUS
  Filled 2012-07-28 (×20): qty 8

## 2012-07-28 MED ORDER — POLYETHYLENE GLYCOL 3350 17 G PO PACK
17.0000 g | PACK | Freq: Every day | ORAL | Status: DC | PRN
  Filled 2012-07-28: qty 1

## 2012-07-28 MED ORDER — SODIUM CHLORIDE 0.9 % IJ SOLN
3.0000 mL | Freq: Two times a day (BID) | INTRAMUSCULAR | Status: DC
  Administered 2012-07-28 – 2012-07-29 (×2): 3 mL via INTRAVENOUS

## 2012-07-28 MED ORDER — DIPHENHYDRAMINE-ZINC ACETATE 2-0.1 % EX CREA
1.0000 "application " | TOPICAL_CREAM | Freq: Two times a day (BID) | CUTANEOUS | Status: DC | PRN
  Filled 2012-07-28: qty 28

## 2012-07-28 MED ORDER — DOCUSATE SODIUM 100 MG PO CAPS
100.0000 mg | ORAL_CAPSULE | Freq: Three times a day (TID) | ORAL | Status: DC | PRN
  Filled 2012-07-28: qty 1

## 2012-07-28 MED ORDER — POTASSIUM CHLORIDE CRYS ER 20 MEQ PO TBCR
20.0000 meq | EXTENDED_RELEASE_TABLET | Freq: Three times a day (TID) | ORAL | Status: DC
  Administered 2012-07-28 – 2012-07-29 (×4): 20 meq via ORAL
  Filled 2012-07-28 (×7): qty 1

## 2012-07-28 MED ORDER — INSULIN GLARGINE 100 UNIT/ML ~~LOC~~ SOLN
20.0000 [IU] | Freq: Every day | SUBCUTANEOUS | Status: DC
  Administered 2012-07-28: 20 [IU] via SUBCUTANEOUS
  Administered 2012-07-29: 10:00:00 via SUBCUTANEOUS
  Administered 2012-07-30 – 2012-08-01 (×3): 20 [IU] via SUBCUTANEOUS

## 2012-07-28 MED ORDER — OMEGA-3-ACID ETHYL ESTERS 1 G PO CAPS
1.0000 g | ORAL_CAPSULE | Freq: Every day | ORAL | Status: DC
  Administered 2012-07-28 – 2012-08-04 (×8): 1 g via ORAL
  Filled 2012-07-28 (×8): qty 1

## 2012-07-28 MED ORDER — FUROSEMIDE 10 MG/ML IJ SOLN
160.0000 mg | Freq: Once | INTRAVENOUS | Status: AC
  Administered 2012-07-28: 160 mg via INTRAVENOUS
  Filled 2012-07-28: qty 16

## 2012-07-28 MED ORDER — ACETAMINOPHEN 325 MG PO TABS: 162.5000 mg | ORAL_TABLET | Freq: Four times a day (QID) | ORAL | Status: DC | PRN

## 2012-07-28 MED ORDER — SODIUM CHLORIDE 0.9 % IJ SOLN
3.0000 mL | Freq: Two times a day (BID) | INTRAMUSCULAR | Status: DC
  Administered 2012-07-29 – 2012-08-04 (×13): 3 mL via INTRAVENOUS

## 2012-07-28 MED ORDER — INSULIN ASPART 100 UNIT/ML ~~LOC~~ SOLN
0.0000 [IU] | Freq: Three times a day (TID) | SUBCUTANEOUS | Status: DC
  Administered 2012-07-29: 2 [IU] via SUBCUTANEOUS
  Administered 2012-07-29 (×2): via SUBCUTANEOUS
  Administered 2012-07-30: 3 [IU] via SUBCUTANEOUS
  Administered 2012-07-31: 2 [IU] via SUBCUTANEOUS
  Administered 2012-07-31: 5 [IU] via SUBCUTANEOUS
  Administered 2012-08-01 (×2): 2 [IU] via SUBCUTANEOUS
  Administered 2012-08-01 – 2012-08-02 (×2): 3 [IU] via SUBCUTANEOUS
  Administered 2012-08-02: 2 [IU] via SUBCUTANEOUS
  Administered 2012-08-03: 3 [IU] via SUBCUTANEOUS
  Administered 2012-08-03: 5 [IU] via SUBCUTANEOUS
  Administered 2012-08-04: 2 [IU] via SUBCUTANEOUS
  Administered 2012-08-04: 5 [IU] via SUBCUTANEOUS

## 2012-07-28 MED ORDER — HEPARIN SODIUM (PORCINE) 5000 UNIT/ML IJ SOLN
5000.0000 [IU] | Freq: Three times a day (TID) | INTRAMUSCULAR | Status: DC
  Administered 2012-07-28 – 2012-08-04 (×20): 5000 [IU] via SUBCUTANEOUS
  Filled 2012-07-28 (×23): qty 1

## 2012-07-28 MED ORDER — SIMVASTATIN 10 MG PO TABS
10.0000 mg | ORAL_TABLET | Freq: Every day | ORAL | Status: DC
  Administered 2012-07-28 – 2012-08-03 (×7): 10 mg via ORAL
  Filled 2012-07-28 (×9): qty 1

## 2012-07-28 MED ORDER — FERROUS SULFATE 325 (65 FE) MG PO TABS
325.0000 mg | ORAL_TABLET | Freq: Two times a day (BID) | ORAL | Status: DC
  Administered 2012-07-28 – 2012-08-04 (×14): 325 mg via ORAL
  Filled 2012-07-28 (×15): qty 1

## 2012-07-28 MED ORDER — GABAPENTIN 600 MG PO TABS
300.0000 mg | ORAL_TABLET | Freq: Two times a day (BID) | ORAL | Status: DC
  Filled 2012-07-28: qty 1

## 2012-07-28 MED ORDER — SODIUM CHLORIDE 0.9 % IJ SOLN
3.0000 mL | INTRAMUSCULAR | Status: DC | PRN
  Administered 2012-08-02: 3 mL via INTRAVENOUS

## 2012-07-28 MED ORDER — METOPROLOL TARTRATE 12.5 MG HALF TABLET
12.5000 mg | ORAL_TABLET | Freq: Two times a day (BID) | ORAL | Status: DC
  Administered 2012-07-28: 12.5 mg via ORAL
  Filled 2012-07-28 (×3): qty 1

## 2012-07-28 NOTE — ED Notes (Signed)
Has been brought to the room from radiology department; pt undressed, in gown, on monitor, continuous pulse oximetry and blood pressure cuff; Bednar, MD in room at this time; family at bedside

## 2012-07-28 NOTE — ED Notes (Signed)
Pt currently in Xray, will transport to A 2 afterwards

## 2012-07-28 NOTE — H&P (Signed)
[facility] History and Physical  [name] [facility] ZOX:[id number] DOB: 05-28-1933 DOA: 07/28/2012  Referring physician: Dr. Fonnie Jarvis PCP: [name], MD  Specialists: Cardiology: [facility] cardiology  Chief Complaint: Increased weight gain within the last week, decrease ambulation and increased swelling  HPI: [name] is a 78 y.o. female  With history of diastolic and systolic CHF stage IV NYHA at baseline (although most recent echocardiogram in July 2013 mentions normal EF, prior echocardiograms have shown decreased EF @ 45 percent).  Presents to the ED with recent complaints of increase in swelling and weight gain of 10 lbs in the last week or so.  Patient feels like she has been more short of breath with activity although she reports that she is unable to do much activity at baseline without getting short of breath.  Denies any fever, chills, chest pain, or hemoptysis.  In ED patient was found to have an elevated BNP 5777 and was given 160 mg IV lasix. Patient was not complaining of any shortness of breath.  Denies any difficulty with her vision.    Family reports that patient is being evaluated as outpatient for tongue lesion which has been present for the last 6 weeks and associated with odynophagia.  Daughter also reports difficulty taking care of patient at home.  Review of Systems: The patient denies anorexia, fever, weight loss,, vision loss, decreased hearing, hoarseness, chest pain, syncope, + dyspnea on exertion, + peripheral edema, balance deficits, hemoptysis, abdominal pain, melena, hematochezia, severe indigestion/heartburn, hematuria, incontinence, genital sores,+ generalized weakness,  + suspicious skin lesions, transient blindness, difficulty walking, depression, unusual weight change, abnormal bleeding, enlarged lymph nodes, angioedema, and breast masses.    Past Medical History  Diagnosis Date  . Coronary artery disease 09/04/08    single vessel CABG at time of AVR    . Aortic stenosis 09/04/08    s/p AVR by Dr Tyrone Sage (tissue valve)  . Paroxysmal atrial fibrillation   . LBBB (left bundle branch block)   . CHF (congestive heart failure)     diastolic  . Sleep apnea   . Alzheimer's dementia   . Diabetes mellitus     type 2  . Hypertension   . Chronic anemia   . Osteoporosis   . Chronic renal failure   . Morbid obesity with BMI of 50.0-59.9, adult 03/09/2012   Past Surgical History  Procedure Date  . Coronary artery bypass graft 09/04/08    single vessel at time of AVR  . Aortic valve replacement 12/09    tissue valve by Dr Tyrone Sage  . Cholecystectomy 1995  . Cataract extraction   . Replacement total knee bilateral     L 2000, R 1997  . Appendectomy    Social History:  reports that she has never smoked. She does not have any smokeless tobacco history on file. She reports that she does not drink alcohol or use illicit drugs. Pt lives at home with daughter Can patient participate in ADLs? Limited due to severe Heart failure  Allergies  Allergen Reactions  . Morphine And Related     "went crazy"  . Celecoxib Other (See Comments)    Sore mouth    Family History  Problem Relation Age of Onset  . Other Father 60    deceased. multiple MI   . Lung cancer Mother 63    died  none other reported.  Prior to Admission medications   Medication Sig Start Date End Date Taking? Authorizing Provider  acetaminophen (TYLENOL) 325  MG tablet Take 162.5 mg by mouth every 6 (six) hours as needed. For pain   Yes Historical Provider, MD  amiodarone (PACERONE) 200 MG tablet Take 100 mg by mouth daily.   Yes Historical Provider, MD  aspirin EC 81 MG tablet Take 81 mg by mouth daily.   Yes Historical Provider, MD  CIPRODEX otic suspension Place 4 drops into both ears 2 (two) times daily as needed. For otitis media 04/26/12  Yes Historical Provider, MD  diphenhydrAMINE-zinc acetate (BENADRYL) cream Apply 1 application topically 2 (two) times daily as needed.  For itching 04/15/12 04/15/13 Yes Clanford Cyndie Mull, MD  docusate sodium (COLACE) 100 MG capsule Take 100 mg by mouth 3 (three) times daily as needed. For constipation   Yes Historical Provider, MD  donepezil (ARICEPT) 10 MG tablet Take 10 mg by mouth at bedtime.   Yes Historical Provider, MD  ferrous sulfate 325 (65 FE) MG tablet Take 325 mg by mouth 2 (two) times daily.   Yes Historical Provider, MD  fish oil-omega-3 fatty acids 1000 MG capsule Take 1 g by mouth daily.    Yes Historical Provider, MD  furosemide (LASIX) 40 MG tablet Take 40 mg by mouth 2 (two) times daily. 4 tabs in the am, 2 tabs at bedtime 04/15/12  Yes Clanford Cyndie Mull, MD  gabapentin (NEURONTIN) 600 MG tablet Take 300-600 mg by mouth 2 (two) times daily. Take 1/2 tab in the morning and 1 tab at bedtime   Yes Historical Provider, MD  insulin glargine (LANTUS) 100 UNIT/ML injection Inject 20 Units into the skin daily. 04/15/12  Yes Clanford Cyndie Mull, MD  Lactobacillus (ACIDOPHILUS PROBIOTIC) 100 MG CAPS Take 1 capsule by mouth daily with breakfast. 04/15/12  Yes Clanford Cyndie Mull, MD  metoprolol tartrate (LOPRESSOR) 25 MG tablet Take 12.5 mg by mouth 2 (two) times daily.  04/23/12  Yes Historical Provider, MD  Multiple Vitamin (MULTIVITAMIN) tablet Take 1 tablet by mouth daily.     Yes Historical Provider, MD  PARoxetine (PAXIL) 40 MG tablet Take 40 mg by mouth every morning.     Yes Historical Provider, MD  polyethylene glycol (MIRALAX / GLYCOLAX) packet Take 17 g by mouth daily as needed. For constipation   Yes Historical Provider, MD  potassium chloride SA (K-DUR,KLOR-CON) 20 MEQ tablet Take 20 mEq by mouth 3 (three) times daily. 04/15/12  Yes Clanford Cyndie Mull, MD  simvastatin (ZOCOR) 10 MG tablet Take 10 mg by mouth at bedtime.     Yes Historical Provider, MD   Physical Exam: Filed Vitals:   07/28/12 1514 07/28/12 1633 07/28/12 1745  BP: 141/78 131/68 122/65  Pulse: 95 98 96  Temp: 98.7 F (37.1 C)    Resp: 18 15 16     SpO2: 100% 95% 99%     General:  Pt in NAD, Alert and Awake  Eyes: EOMI, PERRLA, non icteric  ENT: ulcer at left lower tongue base measuring approximately 3-5 mm, no masses on visual inspection  Neck: supple, no goiter  Cardiovascular: S1 and S2, + murmur, no carotid bruits  Respiratory: CTA BL anteriorly difficult exam due to body habitus, no wheezes  Abdomen: Soft, NT, obese  Skin: warm and dry  Musculoskeletal: no cyanosis or clubbing with edema at LE's BL  Psychiatric: mood and affect appropriate  Neurologic: answers questions appropriately and moves all extremities.  Labs on Admission:  Basic Metabolic Panel:  Lab 07/28/12 1610  NA 138  K 4.9  CL 99  CO2 30  GLUCOSE 171*  BUN 23  CREATININE 1.34*  CALCIUM 9.6  MG --  PHOS --   Liver Function Tests: No results found for this basename: AST:5,ALT:5,ALKPHOS:5,BILITOT:5,PROT:5,ALBUMIN:5 in the last 168 hours No results found for this basename: LIPASE:5,AMYLASE:5 in the last 168 hours No results found for this basename: AMMONIA:5 in the last 168 hours CBC:  Lab 07/28/12 1536 07/26/12 1421  WBC 5.5 5.0  NEUTROABS 3.5 3.2  HGB 12.8 11.9  HCT 40.2 37.3  MCV 95.9 97  PLT 121* 113*   Cardiac Enzymes: No results found for this basename: CKTOTAL:5,CKMB:5,CKMBINDEX:5,TROPONINI:5 in the last 168 hours  BNP (last 3 results)  Basename 07/28/12 1536 04/11/12 2300 11/22/11 1116  PROBNP 5777.0* 17127.0* 7579.0*   CBG: No results found for this basename: GLUCAP:5 in the last 168 hours  Radiological Exams on Admission: Dg Chest 2 View  07/28/2012  *RADIOLOGY REPORT*  Clinical Data: Congestive heart failure.  CHEST - 2 VIEW  Comparison: April 11, 2012.  Findings: Moderate cardiomegaly is stable.  Sternotomy wires are noted.  Central pulmonary vascular congestion is unchanged.  No acute pulmonary disease is noted.  IMPRESSION: Cardiomegaly.  No acute cardiopulmonary abnormality seen.   Original Report Authenticated  By: Lupita Raider.,  M.D.     EKG: Independently reviewed. Sinus Rhythm with LBBB  Assessment/Plan Principal Problem:  *CHF (congestive heart failure) Active Problems:  HYPERLIPIDEMIA  MAJOR DEPRESSIVE DISORDER RECURRENT EPISODE MILD  SLEEP APNEA, OBSTRUCTIVE, MILD  HYPERTENSION, CONTROLLED  PAROXYSMAL ATRIAL FIBRILLATION  Muscle weakness (generalized)  AORTIC VALVE REPLACEMENT, HX OF Elevated troponin   1. CHF - presumed to be diastolic and systolic - consulted patient's cardiologist who will round on patient - lasix 80 mg IV q 8 hours at this juncture - strict I/O's - cardiac diet with fluid restriction to 1800 ml per day - salt restriction - check creatinine level next am.  2. DM - SSI and will continue home regimen of Lantus at this juncture.  3. HTN - well controlled on home regimen.  Will continue at this point - continue to monitor blood pressures  4. Elevated troponin - Most likely related to demand ischemia given elevated BNP. Patient asymptomatic with no chest discomfort - cycle cardiac enzymes to ensure number keeps trending down. - Cardiology on board  5. Depression - Continue home regimen - stable  6. Weakness - check electrolytes - At this point given advanced CHF most likely related to deconditioning and increased weight gain - PT/OT consult - up with assistance  Other plans as per orders  Code Status: DNR Family Communication: Spoke with patient and daughters at bedside Disposition Plan: After discussion with cardiologist patient will require several days of monitoring and diuresis.  Given increased needs family and current caregiver is interested in placement to facility.  Time spent: >60 minutes  Penny Pia [facility] Pager [phone]  If 7PM-7AM, please contact night-coverage [email or web address] Password [facility] 07/28/2012, 6:54 PM

## 2012-07-28 NOTE — ED Notes (Addendum)
Per pt sent over here by Dr. Anne Fu to be admitted for CHF. sts for 1 week and vision problems in the morning. Denies chest pain and SOB.

## 2012-07-28 NOTE — ED Provider Notes (Signed)
History     CSN: [id number]  Arrival date & time 07/28/12  1506   First MD Initiated Contact with Patient 07/28/12 1616      Chief Complaint  Patient presents with  . Congestive Heart Failure    (Consider location/radiation/quality/duration/timing/severity/associated sxs/prior treatment) HPI This 71 female has a history of congestive heart failure, paroxysmal atrial fibrillation, dementia, lives at home with family, usually takes 240 mg daily of Lasix, has had approximately a 10 pound weight gain in the last several days resulting in her arrival to the emergency department today for anticipated admission for heart failure exacerbation, as was recommended by her cardiologist from [facility] cardiology, patient also has several days of symptomatic worse than usual generalized weakness and worsen usual shortness of breath with minimal activity, she is no chest pain, there is no change in her baseline confusion, there's been no fever headache, she has generalized weakness without lateralizing or focal weakness or numbness, she is no change in coordination, she is no change in speech swallowing or understanding, she also has baseline poor vision and states in the last several days when she stands up too fast in the morning she is lightheaded with slightly blurred vision no vertigo no headache no blindness in no sudden change in vision. There is no treatment prior to arrival. Past Medical History  Diagnosis Date  . Coronary artery disease 09/04/08    single vessel CABG at time of AVR   . Aortic stenosis 09/04/08    s/p AVR by Dr Tyrone Sage (tissue valve)  . Paroxysmal atrial fibrillation   . LBBB (left bundle branch block)   . CHF (congestive heart failure)     diastolic  . Sleep apnea   . Alzheimer's dementia   . Diabetes mellitus     type 2  . Hypertension   . Chronic anemia   . Osteoporosis   . Chronic renal failure   . Morbid obesity with BMI of 50.0-59.9, adult 03/09/2012    Past Surgical  History  Procedure Date  . Coronary artery bypass graft 09/04/08    single vessel at time of AVR  . Aortic valve replacement 12/09    tissue valve by Dr Tyrone Sage  . Cholecystectomy 1995  . Cataract extraction   . Replacement total knee bilateral     L 2000, R 1997  . Appendectomy     Family History  Problem Relation Age of Onset  . Other Father 30    deceased. multiple MI   . Lung cancer Mother 76    died    History  Substance Use Topics  . Smoking status: Never Smoker   . Smokeless tobacco: Never Used  . Alcohol Use: No    OB History    Grav Para Term Preterm Abortions TAB SAB Ect Mult Living                  Review of Systems 10 Systems reviewed and are negative for acute change except as noted in the HPI. Allergies  Morphine and related and Celecoxib  Home Medications   No current outpatient prescriptions on file.  BP 122/79  Pulse 96  Temp 97.6 F (36.4 C) (Oral)  Resp 20  Ht 5\' 2"  (1.575 m)  Wt 258 lb 1.6 oz (117.073 kg)  BMI 47.21 kg/m2  SpO2 92%  Physical Exam  Nursing note and vitals reviewed. Constitutional:       Awake, alert, nontoxic appearance.  HENT:  Head: Atraumatic.  1cm clear ulcer left lateral tongue present several weeks Pt's family has already called ENT as they are aware may require biopsy as OutPt  Eyes: Right eye exhibits no discharge. Left eye exhibits no discharge.  Neck: Neck supple.  Cardiovascular: Normal rate and regular rhythm.   Murmur heard. Pulmonary/Chest: Effort normal and breath sounds normal. She has no wheezes. She has no rales. She exhibits no tenderness.       Pt has subjective dyspnea and transient tachypnea with minimal exertion just trying to stand to help transfer into bed from wheelchair requiring assistance  Abdominal: Soft. There is no tenderness. There is no rebound.  Musculoskeletal: She exhibits edema. She exhibits no tenderness.       Baseline ROM, no obvious new focal weakness.  Edema moderate  both legs worse than baseline per Pt and family.  CR<2secs both feet and no cellulitis noted.  Neurological: She is alert.       Mental status and motor strength appears baseline for patient and situation.  Skin: No rash noted.  Psychiatric: She has a normal mood and affect.    ED Course  Procedures (including critical care time) Pt still not in room. 1625  ECG: NSR, rate 98, RAD, LBBB, no significant change compared to prior ECG other than right axis deviation with possible lead reversal  Medical screening examination/treatment/procedure(s) were conducted as a shared visit with non-physician practitioner(s) and myself.  I personally evaluated the patient during the encounter. PLan move to CDU, [facility] paged for admit, HPI does not sound like CVA and Pt/family agree.  D/w [facility] will see Pt in ED to determine dispo.1800 Labs Reviewed  CBC WITH DIFFERENTIAL - Abnormal; Notable for the following:    RDW 16.4 (*)     Platelets 121 (*)     Monocytes Relative 16 (*)     All other components within normal limits  BASIC METABOLIC PANEL - Abnormal; Notable for the following:    Glucose, Bld 171 (*)     Creatinine, Ser 1.34 (*)     GFR calc non Af Amer 37 (*)     GFR calc Af Amer 43 (*)     All other components within normal limits  PRO B NATRIURETIC PEPTIDE - Abnormal; Notable for the following:    Pro B Natriuretic peptide (BNP) 5777.0 (*)     All other components within normal limits  POCT I-STAT TROPONIN I - Abnormal; Notable for the following:    Troponin i, poc 0.11 (*)     All other components within normal limits  PROTIME-INR - Abnormal; Notable for the following:    Prothrombin Time 15.3 (*)     All other components within normal limits  CBC - Abnormal; Notable for the following:    RDW 16.4 (*)     Platelets 121 (*)     All other components within normal limits  CREATININE, SERUM - Abnormal; Notable for the following:    Creatinine, Ser 1.27 (*)     GFR calc non Af Amer 40  (*)     GFR calc Af Amer 46 (*)     All other components within normal limits  TSH - Abnormal; Notable for the following:    TSH 6.023 (*)     All other components within normal limits  CBC - Abnormal; Notable for the following:    Hemoglobin 11.8 (*)     RDW 16.4 (*)     Platelets 103 (*)  PLATELET COUNT CONFIRMED  BY SMEAR   All other components within normal limits  BASIC METABOLIC PANEL - Abnormal; Notable for the following:    Glucose, Bld 118 (*)     Creatinine, Ser 1.21 (*)     GFR calc non Af Amer 42 (*)     GFR calc Af Amer 49 (*)     All other components within normal limits  HEMOGLOBIN A1C - Abnormal; Notable for the following:    Hemoglobin A1C 8.1 (*)     Mean Plasma Glucose 186 (*)     All other components within normal limits  GLUCOSE, CAPILLARY - Abnormal; Notable for the following:    Glucose-Capillary 174 (*)     All other components within normal limits  GLUCOSE, CAPILLARY - Abnormal; Notable for the following:    Glucose-Capillary 137 (*)     All other components within normal limits  GLUCOSE, CAPILLARY - Abnormal; Notable for the following:    Glucose-Capillary 131 (*)     All other components within normal limits  CBC - Abnormal; Notable for the following:    Hemoglobin 11.9 (*)     RDW 16.2 (*)     Platelets 104 (*)  CONSISTENT WITH PREVIOUS RESULT   All other components within normal limits  BASIC METABOLIC PANEL - Abnormal; Notable for the following:    Creatinine, Ser 1.12 (*)     GFR calc non Af Amer 46 (*)     GFR calc Af Amer 53 (*)     All other components within normal limits  GLUCOSE, CAPILLARY - Abnormal; Notable for the following:    Glucose-Capillary 144 (*)     All other components within normal limits  GLUCOSE, CAPILLARY - Abnormal; Notable for the following:    Glucose-Capillary 167 (*)     All other components within normal limits  GLUCOSE, CAPILLARY - Abnormal; Notable for the following:    Glucose-Capillary 105 (*)     All other  components within normal limits  GLUCOSE, CAPILLARY - Abnormal; Notable for the following:    Glucose-Capillary 158 (*)     All other components within normal limits  MAGNESIUM  PHOSPHORUS  TROPONIN I  TROPONIN I  TROPONIN I  MRSA PCR SCREENING  T4, FREE   No results found.   1. CHF (congestive heart failure)   2. Atrial fibrillation   3. Elevated troponin   4. Major depressive disorder, recurrent episode, mild   5. Morbid obesity   6. Morbid obesity with BMI of 50.0-59.9, adult   7. Muscle weakness (generalized)   8. Type II or unspecified type diabetes mellitus with neurological manifestations, not stated as uncontrolled(250.60)   9. Acute diastolic CHF (congestive heart failure)   10. Essential hypertension, benign   11. Alopecia   12. Heart valve replaced by other means   13. Tongue ulcer       MDM  The patient appears reasonably stabilized for admission considering the current resources, flow, and capabilities available in the ED at this time, and I doubt any other [facility] requiring further screening and/or treatment in the ED prior to admission.        Hurman Horn, MD 07/30/12 [id number]

## 2012-07-29 ENCOUNTER — Encounter (HOSPITAL_COMMUNITY): Payer: Self-pay | Admitting: General Practice

## 2012-07-29 ENCOUNTER — Other Ambulatory Visit: Payer: Self-pay

## 2012-07-29 DIAGNOSIS — I10 Essential (primary) hypertension: Secondary | ICD-10-CM

## 2012-07-29 DIAGNOSIS — I5031 Acute diastolic (congestive) heart failure: Secondary | ICD-10-CM

## 2012-07-29 LAB — BASIC METABOLIC PANEL
Calcium: 8.9 mg/dL (ref 8.4–10.5)
GFR calc Af Amer: 49 mL/min — ABNORMAL LOW (ref >90)
GFR calc non Af Amer: 42 mL/min — ABNORMAL LOW (ref >90)
Potassium: 3.5 mEq/L (ref 3.5–5.1)
Sodium: 142 mEq/L (ref 135–145)

## 2012-07-29 LAB — CBC
HCT: 36.9 % (ref 36.0–46.0)
Hemoglobin: 11.8 g/dL — ABNORMAL LOW (ref 12.0–15.0)
MCH: 30.5 pg (ref 26.0–34.0)
MCHC: 32 g/dL (ref 30.0–36.0)
MCV: 95.3 fL (ref 78.0–100.0)
Platelets: 103 10*3/uL — ABNORMAL LOW (ref 150–400)
RBC: 3.87 MIL/uL (ref 3.87–5.11)
RDW: 16.4 % — ABNORMAL HIGH (ref 11.5–15.5)
WBC: 5 10*3/uL (ref 4.0–10.5)

## 2012-07-29 LAB — GLUCOSE, CAPILLARY
Glucose-Capillary: 137 mg/dL — ABNORMAL HIGH (ref 70–99)
Glucose-Capillary: 167 mg/dL — ABNORMAL HIGH (ref 70–99)

## 2012-07-29 LAB — TROPONIN I
Troponin I: <0.3 ng/mL (ref <0.30)
Troponin I: <0.3 ng/mL (ref <0.30)

## 2012-07-29 LAB — TSH: TSH: 6.023 u[IU]/mL — ABNORMAL HIGH (ref 0.350–4.500)

## 2012-07-29 LAB — HEMOGLOBIN A1C
Hgb A1c MFr Bld: 8.1 % — ABNORMAL HIGH (ref <5.7)
Mean Plasma Glucose: 186 mg/dL — ABNORMAL HIGH (ref <117)

## 2012-07-29 MED ORDER — METOLAZONE 2.5 MG PO TABS
2.5000 mg | ORAL_TABLET | Freq: Every day | ORAL | Status: DC
  Administered 2012-07-30 – 2012-08-02 (×4): 2.5 mg via ORAL
  Filled 2012-07-29 (×5): qty 1

## 2012-07-29 MED ORDER — CARVEDILOL 3.125 MG PO TABS
3.1250 mg | ORAL_TABLET | Freq: Two times a day (BID) | ORAL | Status: DC
  Administered 2012-07-30 – 2012-08-03 (×9): 3.125 mg via ORAL
  Filled 2012-07-29 (×14): qty 1

## 2012-07-29 MED ORDER — TRIAMCINOLONE ACETONIDE 0.1 % MT PSTE
PASTE | Freq: Four times a day (QID) | OROMUCOSAL | Status: DC
  Administered 2012-07-30 – 2012-07-31 (×7): via OROMUCOSAL
  Filled 2012-07-29: qty 5

## 2012-07-29 NOTE — Consult Note (Signed)
Admit date: 07/28/2012 Referring Physician  Dr. Cena Benton Primary Physician [name], MD Primary Cardiologist  [facility] Reason for Consultation  Heart failure  HPI: 78 year old female with prior history of bypass surgery, coronary artery disease status post aortic valve replacement in 2009 with multiple comorbidities including obesity, prior atrial flutter, hypertension, diabetes, early possible dementia who was admitted to the hospital for heart failure exacerbation. Her daughter [name] has been taking excellent care of her and has noted increasing swelling in her legs. We have seen her in clinic multiple times with this issue. We've tried multiple different regimens, increasing her Lasix. She does admit to drinking too much fluid.  She had called Burna Mortimer, my nurse yesterday and also stated that she was having some visual changes. Because of this, she was sent to the emergency room for further evaluation.  Current BNP was 5777, creatinine was 1.34 on admission, currently 1.21. Troponins have been all normal except for point-of-care troponin that was mildly elevated at 0.11., Hemoglobin 11.8, hemoglobin A1c 8.1, TSH slightly elevated at 6.0-3. EKG shows left bundle branch block, P wave activity, likely sinus rhythm with PACs.  Currently she is laying in bed, sleeping but easily arousable. Comfortable. Mild shortness of breath.  PMH:   Past Medical History  Diagnosis Date  . Coronary artery disease 09/04/08    single vessel CABG at time of AVR   . Aortic stenosis 09/04/08    s/p AVR by Dr Tyrone Sage (tissue valve)  . Paroxysmal atrial fibrillation   . LBBB (left bundle branch block)   . CHF (congestive heart failure)     diastolic  . Sleep apnea   . Alzheimer's dementia   . Diabetes mellitus     type 2  . Hypertension   . Chronic anemia   . Osteoporosis   . Chronic renal failure   . Morbid obesity with BMI of 50.0-59.9, adult 03/09/2012    PSH:   Past Surgical History  Procedure Date  .  Coronary artery bypass graft 09/04/08    single vessel at time of AVR  . Aortic valve replacement 12/09    tissue valve by Dr Tyrone Sage  . Cholecystectomy 1995  . Cataract extraction   . Replacement total knee bilateral     L 2000, R 1997  . Appendectomy    Allergies:  Morphine and related and Celecoxib Prior to Admit Meds:   Prescriptions prior to admission  Medication Sig Dispense Refill  . acetaminophen (TYLENOL) 325 MG tablet Take 162.5 mg by mouth every 6 (six) hours as needed. For pain      . amiodarone (PACERONE) 200 MG tablet Take 100 mg by mouth daily.      Marland Kitchen aspirin EC 81 MG tablet Take 81 mg by mouth daily.      Marland Kitchen CIPRODEX otic suspension Place 4 drops into both ears 2 (two) times daily as needed. For otitis media      . diphenhydrAMINE-zinc acetate (BENADRYL) cream Apply 1 application topically 2 (two) times daily as needed. For itching      . docusate sodium (COLACE) 100 MG capsule Take 100 mg by mouth 3 (three) times daily as needed. For constipation      . donepezil (ARICEPT) 10 MG tablet Take 10 mg by mouth at bedtime.      . ferrous sulfate 325 (65 FE) MG tablet Take 325 mg by mouth 2 (two) times daily.      . fish oil-omega-3 fatty acids 1000 MG capsule Take 1 g by  mouth daily.       . furosemide (LASIX) 40 MG tablet Take 40 mg by mouth 2 (two) times daily. 4 tabs in the am, 2 tabs at bedtime      . gabapentin (NEURONTIN) 600 MG tablet Take 300-600 mg by mouth 2 (two) times daily. Take 1/2 tab in the morning and 1 tab at bedtime      . insulin glargine (LANTUS) 100 UNIT/ML injection Inject 20 Units into the skin daily.  10 mL  0  . Lactobacillus (ACIDOPHILUS PROBIOTIC) 100 MG CAPS Take 1 capsule by mouth daily with breakfast.      . metoprolol tartrate (LOPRESSOR) 25 MG tablet Take 12.5 mg by mouth 2 (two) times daily.       . Multiple Vitamin (MULTIVITAMIN) tablet Take 1 tablet by mouth daily.        Marland Kitchen PARoxetine (PAXIL) 40 MG tablet Take 40 mg by mouth every morning.         . polyethylene glycol (MIRALAX / GLYCOLAX) packet Take 17 g by mouth daily as needed. For constipation      . potassium chloride SA (K-DUR,KLOR-CON) 20 MEQ tablet Take 20 mEq by mouth 3 (three) times daily.      . simvastatin (ZOCOR) 10 MG tablet Take 10 mg by mouth at bedtime.        . triamcinolone (KENALOG) 0.1 % paste Place 1 application onto teeth 4 (four) times daily.       Fam HX:    Family History  Problem Relation Age of Onset  . Other Father 61    deceased. multiple MI   . Lung cancer Mother 54    died   Social HX:    History   Social History  . Marital Status: Widowed    Spouse Name: N/A    Number of Children: N/A  . Years of Education: N/A   Occupational History  . retired     lives with her daughter   Social History Main Topics  . Smoking status: Never Smoker   . Smokeless tobacco: Never Used  . Alcohol Use: No  . Drug Use: No  . Sexually Active: No   Other Topics Concern  . Not on file   Social History Narrative   Lives in [place] with daughter.  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     ROS: Denies any fevers, coughs. She can feel her heart fluttering at times. Positive weakness, edema, multiple somatic complaints. Her face has recently been read. She has a possible "cancer "she states in her mouth. All 11 ROS were addressed and are negative except what is stated in the HPI  Physical Exam: Blood pressure 113/79, pulse 99, temperature 97.7 F (36.5 C), temperature source Oral, resp. rate 18, height 5\' 2"  (1.575 m), weight 117.2 kg (258 lb 6.1 oz), SpO2 96.00%.    General: Well developed, well nourished, in no acute distress Head: Eyes PERRLA, No xanthomas.   Normal cephalic and atramatic, mildly reddened appearance to face. Hair loss present Lungs:   Clear bilaterally to auscultation and percussion. Normal respiratory effort. No wheezes, no rales. Distant breath sounds Heart:  HRRR S1 S2 with frequent ectopy. Pulses difficult  to palpate distally      No carotid bruit. No JVD but difficult to see.  No abdominal bruits.  Abdomen: Bowel sounds are positive, abdomen soft and non-tender without masses. Obese Msk:  Back normal. Normal strength and tone for age.  Extremities:  Chronic, 3+ edema bilateral lower extremities  DP +1 Neuro: Alert and oriented X 3, non-focal, MAE x 4 GU: Deferred Rectal: Deferred Psych:  Good affect, responds appropriately    Labs:   Lab Results  Component Value Date   WBC 5.0 07/29/2012   HGB 11.8* 07/29/2012   HCT 36.9 07/29/2012   MCV 95.3 07/29/2012   PLT 103* 07/29/2012    Lab 07/29/12 0201  NA 142  K 3.5  CL 102  CO2 27  BUN 21  CREATININE 1.21*  CALCIUM 8.9  PROT --  BILITOT --  ALKPHOS --  ALT --  AST --  GLUCOSE 118*   No results found for this basename: PTT   Lab Results  Component Value Date   INR 1.23 07/28/2012   INR 1.17 04/11/2012   INR 1.42 11/25/2011   Lab Results  Component Value Date   CKTOTAL 42 04/12/2012   CKMB 1.4 04/12/2012   TROPONINI <0.30 07/29/2012     Lab Results  Component Value Date   CHOL  Value: 111        ATP III CLASSIFICATION:  <200     mg/dL   Desirable  161-096  mg/dL   Borderline High  >=045    mg/dL   High 40/05/8118   Lab Results  Component Value Date   HDL 42 08/30/2008   Lab Results  Component Value Date   LDLCALC  Value: 54        Total Cholesterol/HDL:CHD Risk Coronary Heart Disease Risk Table                     Men   Women  1/2 Average Risk   3.4   3.3 08/30/2008   Lab Results  Component Value Date   TRIG 76 08/30/2008   Lab Results  Component Value Date   CHOLHDL 2.6 08/30/2008   No results found for this basename: LDLDIRECT      Radiology:  Dg Chest 2 View  07/28/2012  *RADIOLOGY REPORT*  Clinical Data: Congestive heart failure.  CHEST - 2 VIEW  Comparison: April 11, 2012.  Findings: Moderate cardiomegaly is stable.  Sternotomy wires are noted.  Central pulmonary vascular congestion is unchanged.  No acute  pulmonary disease is noted.  IMPRESSION: Cardiomegaly.  No acute cardiopulmonary abnormality seen.   Original Report Authenticated By: Lupita Raider.,  M.D.    Personally viewed.  EKG:  As above Personally viewed.   ASSESSMENT/PLAN:   78 year old female with acute on chronic diastolic heart failure, EF normal on last echo in July of 2013, obesity, diabetes, coronary artery disease status post aortic valve replacement in December 2009 status post one-vessel bypass.  1. acute on chronic diastolic heart failure-agree with current plan for Lasix 80 mg IV 3 times a day. She is -1.2 L thus far. If increased urine output is needed, we will add low-dose metolazone 2.5 mg once a day. Continue to monitor electrolytes closely. Much of her edema as well is being exacerbated by obesity. She also needs extensive counseling once again on fluid administration.  2. Atrial flutter/fibrillation-low-dose amiodarone 100 mg once a day. Chronic. Watch him closely with left bundle branch block. She is at risk for development of worsening conduction disorder. She's had prior consultation with Dr. Johney Frame. She was not interested in pacemaker at that time. Prior atrial flutter.  3. Coronary artery disease-stable, no anginal symptoms  4. Morbid obesity-continue encourage weight loss.  Physical therapy very important.  Appreciate diabetic management. Mildly elevated point-of-care troponin likely a result of diastolic heart failure, remainder troponins were normal.  We will continue to follow closely with you. Thank you for your excellent care.  Donato Schultz, MD  07/29/2012  12:14 PM

## 2012-07-29 NOTE — Progress Notes (Signed)
[facility] PROGRESS NOTE  [name] [facility] ZOX:[id number] DOB: 05/25/1933 DOA: 07/28/2012 PCP: [name], MD  Assessment/Plan: Acute on chronic systolic and diastolic heart failure -04/12/2012 echocardiogram shows ejection fraction 60-65% -09/17/2008 echocardiogram shows ejection fraction 45% -Continue furosemide 80 mg IV every 8 hours -Fluid restriction, daily weights -neg 1.6 kg in the past 24 hours Elevated troponin -Likely demand ischemia -Repeat troponins negative Hypertension -Well controlled -Switch metoprolol tartrate to carvedilol Diabetes mellitus type 2 -Continue Lantus 20 units at bedtime -NovoLog sliding scale Deconditioning -PT evaluation Chronic thrombocytopenia -No evidence of active bleeding -Continue to monitor     Family Communication:   Spoke with daughter Blaine Hamper Disposition Plan:   Home when medically stable    Procedures/Studies: Dg Chest 2 View  07/28/2012  *RADIOLOGY REPORT*  Clinical Data: Congestive heart failure.  CHEST - 2 VIEW  Comparison: April 11, 2012.  Findings: Moderate cardiomegaly is stable.  Sternotomy wires are noted.  Central pulmonary vascular congestion is unchanged.  No acute pulmonary disease is noted.  IMPRESSION: Cardiomegaly.  No acute cardiopulmonary abnormality seen.   Original Report Authenticated By: Lupita Raider.,  M.D.          Subjective: Patient is breathing 25% better. She denies any fevers, chills, chest pain, nausea, vomiting, diarrhea, abdominal pain, dysuria, dizziness, palpitations.  Objective: Filed Vitals:   07/28/12 2019 07/28/12 2237 07/28/12 2300 07/29/12 0441  BP: 117/80 120/81 118/72 112/72  Pulse: 98 96 98 92  Temp: 97.8 F (36.6 C)   97.8 F (36.6 C)  TempSrc: Oral   Oral  Resp: 18  18 18   Height: 5\' 2"  (1.575 m)     Weight: 118.162 kg (260 lb 8 oz)   117.2 kg (258 lb 6.1 oz)  SpO2: 95%  97% 96%    Intake/Output Summary (Last 24 hours) at 07/29/12 0835 Last data filed  at 07/29/12 0700  Gross per 24 hour  Intake      3 ml  Output   1250 ml  Net  -1247 ml   Weight change:  Exam:   General:  Pt is alert, follows commands appropriately, not in acute distress  HEENT: No icterus, No thrush,  [place]/AT  Cardiovascular: RRR, S1/S2, no rubs, no gallops  Respiratory: Bibasilar crackles. No wheezes or rhonchi. Good air movement.  Abdomen: Soft/+BS, non tender, non distended, no guarding  Extremities: 2+ edema, No lymphangitis, No petechiae, No rashes, no synovitis  Data Reviewed: Basic Metabolic Panel:  Lab 07/29/12 8119 07/28/12 2111 07/28/12 1536  NA 142 -- 138  K 3.5 -- 4.9  CL 102 -- 99  CO2 27 -- 30  GLUCOSE 118* -- 171*  BUN 21 -- 23  CREATININE 1.21* 1.27* 1.34*  CALCIUM 8.9 -- 9.6  MG -- 2.1 --  PHOS -- 3.3 --   Liver Function Tests: No results found for this basename: AST:5,ALT:5,ALKPHOS:5,BILITOT:5,PROT:5,ALBUMIN:5 in the last 168 hours No results found for this basename: LIPASE:5,AMYLASE:5 in the last 168 hours No results found for this basename: AMMONIA:5 in the last 168 hours CBC:  Lab 07/29/12 0201 07/28/12 2111 07/28/12 1536 07/26/12 1421  WBC 5.0 5.1 5.5 5.0  NEUTROABS -- -- 3.5 3.2  HGB 11.8* 13.1 12.8 11.9  HCT 36.9 40.6 40.2 37.3  MCV 95.3 96.2 95.9 97  PLT 103* 121* 121* 113*   Cardiac Enzymes:  Lab 07/29/12 0201 07/28/12 2112  CKTOTAL -- --  CKMB -- --  CKMBINDEX -- --  TROPONINI <0.30 <0.30   BNP: No components  found with this basename: POCBNP:5 CBG:  Lab 07/29/12 0645 07/28/12 2154  GLUCAP 137* 174*    Recent Results (from the past 240 hour(s))  MRSA PCR SCREENING     Status: Normal   Collection Time   07/29/12  2:45 AM      Component Value Range Status Comment   MRSA by PCR NEGATIVE  NEGATIVE Final      Scheduled Meds:    . amiodarone  100 mg Oral Daily  . aspirin EC  81 mg Oral Daily  . carvedilol  3.125 mg Oral BID WC  . donepezil  10 mg Oral QHS  . ferrous sulfate  325 mg Oral BID  .  furosemide  160 mg Intravenous Once  . furosemide  80 mg Intravenous Q8H  . gabapentin  300 mg Oral Daily  . gabapentin  600 mg Oral QHS  . heparin  5,000 Units Subcutaneous Q8H  . insulin aspart  0-15 Units Subcutaneous TID WC  . insulin glargine  20 Units Subcutaneous Daily  . omega-3 acid ethyl esters  1 g Oral Daily  . PARoxetine  40 mg Oral [id number]  . potassium chloride SA  20 mEq Oral TID  . simvastatin  10 mg Oral QHS  . sodium chloride  3 mL Intravenous Q12H  . sodium chloride  3 mL Intravenous Q12H  . DISCONTD: gabapentin  300-600 mg Oral BID  . DISCONTD: metoprolol tartrate  12.5 mg Oral BID   Continuous Infusions:    Palmyra Rogacki, DO  [facility] Pager [phone]  If 7PM-7AM, please contact night-coverage [email or web address] Password [email or web address] 07/29/2012, 8:35 AM   LOS: 1 day

## 2012-07-29 NOTE — Evaluation (Signed)
Physical Therapy Evaluation Patient Details Name: [name] MRN: [id number] DOB: 02/18/1933 Today's Date: 07/29/2012 Time: 4098-1191 PT Time Calculation (min): 18 min  PT Assessment / Plan / Recommendation Clinical Impression  Pt admitted with bil LE edema, SOB and CHF exacerbation. Pt states she has limited mobility at baseline and has received HHPT previously and has no desire to have therapy at home again but agreeable to followup acutely to maximize gait and function. Pt educated for importance of maintaining activity and HEP to maintain heart health and states she is aware but states she doesn't follow through with it at home. Pt also educated to use RW at home versus holding wall, pt states HHPT also told her that but then gave up after pt wouldn't use RW.     PT Assessment  Patient needs continued PT services    Follow Up Recommendations  No PT follow up    Does the patient have the potential to tolerate intense rehabilitation      Barriers to Discharge Decreased caregiver support      Equipment Recommendations  None recommended by PT    Recommendations for Other Services     Frequency Min 3X/week    Precautions / Restrictions Precautions Precautions: Fall Restrictions Weight Bearing Restrictions: No   Pertinent Vitals/Pain HR 99      Mobility  Bed Mobility Bed Mobility: Sit to Supine Sit to Supine: 6: Modified independent (Device/Increase time);HOB flat Details for Bed Mobility Assistance: increased time with increased difficulty elevating bil LE onto surface Transfers Transfers: Sit to Stand;Stand to Sit Sit to Stand: 6: Modified independent (Device/Increase time);From chair/3-in-1 Stand to Sit: 6: Modified independent (Device/Increase time);To chair/3-in-1;To bed Details for Transfer Assistance: increased time and definite need to use hands to transfer Ambulation/Gait Ambulation/Gait Assistance: 4: Min assist Ambulation Distance (Feet): 70  Feet Assistive device: 1 person hand held assist Ambulation/Gait Assistance Details: Pt with increased sway and short strides with gait. Pt states she generally holds wall at home to walk even though she has available DME Gait Pattern: Step-through pattern;Decreased stride length;Wide base of support Gait velocity: decreased Stairs: No    Shoulder Instructions     Exercises     PT Diagnosis: Difficulty walking  PT Problem List: Decreased activity tolerance;Decreased balance;Decreased knowledge of use of DME;Decreased safety awareness PT Treatment Interventions: Gait training;DME instruction;Functional mobility training;Therapeutic exercise;Patient/family education   PT Goals Acute Rehab PT Goals PT Goal Formulation: With patient Time For Goal Achievement: 08/05/12 Potential to Achieve Goals: Fair Pt will go Supine/Side to Sit: with modified independence;with HOB 0 degrees (no rails) PT Goal: Supine/Side to Sit - Progress: Goal set today Pt will go Sit to Supine/Side: with modified independence;with HOB 0 degrees (no rails) PT Goal: Sit to Supine/Side - Progress: Goal set today Pt will Ambulate: 51 - 150 feet;with modified independence;with least restrictive assistive device PT Goal: Ambulate - Progress: Goal set today Pt will Perform Home Exercise Program: Independently PT Goal: Perform Home Exercise Program - Progress: Goal set today  Visit Information  Last PT Received On: 07/29/12 Assistance Needed: +1    Subjective Data  Subjective: I can't walk too far at home Patient Stated Goal: return home with my dgtr   Prior Functioning  Home Living Lives With: Daughter Available Help at Discharge: Personal care attendant;Available PRN/intermittently;Family Type of Home: House Home Access: Ramped entrance Home Layout: One level Bathroom Shower/Tub: Engineer, manufacturing systems: Standard Home Adaptive Equipment: Bedside commode/3-in-1;Tub transfer bench;Walker -  rolling;Wheelchair - [employer]  cane Prior Function Level of Independence: Needs assistance Needs Assistance: Bathing;Dressing;Meal Prep;Light Housekeeping Bath: Minimal Dressing: Minimal Meal Prep: Maximal Light Housekeeping: Total Able to Take Stairs?: No Driving: No Vocation: Retired Comments: Pt has aide 2x wk who assists with bathing in shower otherwise pt performs sponge bathing unassisted.  Communication Communication: No difficulties    Cognition  Overall Cognitive Status: Appears within functional limits for tasks assessed/performed Arousal/Alertness: Awake/alert Orientation Level: Appears intact for tasks assessed Behavior During Session: [facility] for tasks performed    Extremity/Trunk Assessment Right Lower Extremity Assessment RLE ROM/Strength/Tone: [facility] for tasks assessed (grossly 3/5 did not formally assess) Left Lower Extremity Assessment LLE ROM/Strength/Tone: WFL for tasks assessed Trunk Assessment Trunk Assessment: Kyphotic   Balance    End of Session PT - End of Session Activity Tolerance: Patient limited by fatigue Patient left: in bed;with call bell/phone within reach Nurse Communication: Mobility status  GP     Toney Sang [facility] 07/29/2012, 9:57 AM  Delaney Meigs, PT [phone]

## 2012-07-29 NOTE — Progress Notes (Signed)
Pt now in a.fib. Pt asymptomatic. MD notified, will continue to monitor.

## 2012-07-29 NOTE — Plan of Care (Signed)
Problem: Phase I Progression Outcomes Goal: Dyspnea controlled at rest (HF) Outcome: Completed/Met Date Met:  07/29/12 Pt has not had any c/o sob while at rest, pts O2 WNL on room air, goal met, will continue to monitor Goal: EF % per last Echo/documented,Core Reminder form on chart Outcome: Completed/Met Date Met:  07/29/12 pts EF from 03/2012 is 60-65% goal met

## 2012-07-29 NOTE — Progress Notes (Signed)
UOP has declined today Will add metolazone 2.5mg  daily  Watch electrolytes

## 2012-07-30 DIAGNOSIS — K140 Glossitis: Secondary | ICD-10-CM

## 2012-07-30 LAB — CBC
HCT: 37.7 % (ref 36.0–46.0)
Hemoglobin: 11.9 g/dL — ABNORMAL LOW (ref 12.0–15.0)
RBC: 3.97 MIL/uL (ref 3.87–5.11)
WBC: 4.7 10*3/uL (ref 4.0–10.5)

## 2012-07-30 LAB — GLUCOSE, CAPILLARY

## 2012-07-30 LAB — BASIC METABOLIC PANEL
BUN: 19 mg/dL (ref 6–23)
Chloride: 101 mEq/L (ref 96–112)
GFR calc Af Amer: 53 mL/min — ABNORMAL LOW (ref >90)
Glucose, Bld: 98 mg/dL (ref 70–99)
Potassium: 3.5 mEq/L (ref 3.5–5.1)

## 2012-07-30 MED ORDER — LIDOCAINE VISCOUS 2 % MT SOLN
20.0000 mL | Freq: Three times a day (TID) | OROMUCOSAL | Status: DC
  Administered 2012-07-30: 20 mL via OROMUCOSAL
  Filled 2012-07-30 (×22): qty 20

## 2012-07-30 MED ORDER — POTASSIUM CHLORIDE CRYS ER 20 MEQ PO TBCR
40.0000 meq | EXTENDED_RELEASE_TABLET | Freq: Three times a day (TID) | ORAL | Status: DC
  Administered 2012-07-30 – 2012-08-04 (×17): 40 meq via ORAL
  Filled 2012-07-30 (×16): qty 2

## 2012-07-30 NOTE — Progress Notes (Addendum)
Subjective:  Disappointed that she only lost 2 pounds she states. She was urinating throughout the night. Her observed urine output is not as good as I wouldn't want to BE. I have added to call his own.  Objective:  Vital Signs in the last 24 hours: Temp:  [97.1 F (36.2 C)-97.9 F (36.6 C)] 97.1 F (36.2 C) (11/03 0548) Pulse Rate:  [66-99] 66  (11/03 0548) Resp:  [18-20] 20  (11/03 0548) BP: (95-140)/(57-79) 140/69 mmHg (11/03 0548) SpO2:  [96 %-98 %] 98 % (11/03 0548) Weight:  [117.073 kg (258 lb 1.6 oz)] 117.073 kg (258 lb 1.6 oz) (11/03 0548)  Intake/Output from previous day: 11/02 0701 - 11/03 0700 In: 716 [P.O.:700; IV Piggyback:16] Out: 1526 [Urine:1525; Stool:1]   Physical Exam: General: In bed, appears deconditioned, overweight. Head:  Normocephalic and atraumatic. Lungs: Clear to auscultation and percussion. Distant breath sounds Heart: Normal S1 and S2.  2/6 systolic murmur, no rubs or gallops.  Abdomen: soft, non-tender, positive bowel sounds. Obese Extremities: No clubbing or cyanosis. Chronic 3+ edema. Neurologic: Alert and oriented x 3.    Lab Results:  Basename 07/30/12 0630 07/29/12 0201  WBC 4.7 5.0  HGB 11.9* 11.8*  PLT 104* 103*    Basename 07/30/12 0630 07/29/12 0201  NA 138 142  K 3.5 3.5  CL 101 102  CO2 25 27  GLUCOSE 98 118*  BUN 19 21  CREATININE 1.12* 1.21*    Basename 07/29/12 0820 07/29/12 0201  TROPONINI <0.30 <0.30   Imaging: Dg Chest 2 View  07/28/2012  *RADIOLOGY REPORT*  Clinical Data: Congestive heart failure.  CHEST - 2 VIEW  Comparison: April 11, 2012.  Findings: Moderate cardiomegaly is stable.  Sternotomy wires are noted.  Central pulmonary vascular congestion is unchanged.  No acute pulmonary disease is noted.  IMPRESSION: Cardiomegaly.  No acute cardiopulmonary abnormality seen.   Original Report Authenticated By: Lupita Raider.,  M.D.    Personally viewed.   Telemetry: No adverse rhythms, very brief burst of atrial  fibrillation with heart rate in the 140s. Personally viewed.    Cardiac Studies:  Recent echo, normal EF  Assessment/Plan:  Principal Problem:  *CHF (congestive heart failure) Active Problems:  HYPERLIPIDEMIA  MAJOR DEPRESSIVE DISORDER RECURRENT EPISODE MILD  SLEEP APNEA, OBSTRUCTIVE, MILD  HYPERTENSION, CONTROLLED  PAROXYSMAL ATRIAL FIBRILLATION  Muscle weakness (generalized)  AORTIC VALVE REPLACEMENT, HX OF  Elevated troponin  Acute diastolic CHF (congestive heart failure)  -Hypokalemia -- continue to replete. I will increase dosing given more aggressive diuresis. -Currently sinus rhythm -Added metolazone 2.5 mg to morning regimen with Lasix 80 mg IV 3 times a day. Would like to avoid Foley catheter if possible. Would not be unreasonable however to place while we are aggressively diuresing. -Brief atrial fibrillation burst-continue amiodarone. Appreciate hospitalist care.    SKAINS, MARK 07/30/2012, 8:17 AM

## 2012-07-30 NOTE — Progress Notes (Signed)
[facility] PROGRESS NOTE  [name] [facility] RUE:[id number] DOB: 06-Apr-1933 DOA: 07/28/2012 PCP: [name], MD  Assessment/Plan:  Acute on chronic  diastolic heart failure  -04/12/2012 echocardiogram shows ejection fraction 60-65%  -09/17/2008 echocardiogram shows ejection fraction 45%  -Continue furosemide 80 mg IV every 8 hours  -Fluid restriction, daily weights  -neg 1.7 kg in the past 48 hours/ -2057cc -metazolone added today Elevated troponin  -Likely demand ischemia/CHF -Repeat troponins negative  Facial/Tongue Pain -??trigeminal neuralgia vs referred pain from tongue lesion -Spoke with ENT on call--Dr. Kelli Churn, recommended call Dr. Pollyann Kennedy in am to discuss case as patient is clinically stable -Exam did not reveal any facial edema or exophthalmos or airway compromise -will try viscous lidocaine Hypertension  -Well controlled  -continue carvedilol  Diabetes mellitus type 2  -Continue Lantus 20 units at bedtime  -NovoLog sliding scale  -Accu-Cheks are controlled. Deconditioning  -PT evaluation  -Consult social worker for skilled nursing facility placement Chronic thrombocytopenia  -No evidence of active bleeding  -Continue to monitor    Family Communication:   Discussed with daughter     Procedures/Studies: Dg Chest 2 View  07/28/2012  *RADIOLOGY REPORT*  Clinical Data: Congestive heart failure.  CHEST - 2 VIEW  Comparison: April 11, 2012.  Findings: Moderate cardiomegaly is stable.  Sternotomy wires are noted.  Central pulmonary vascular congestion is unchanged.  No acute pulmonary disease is noted.  IMPRESSION: Cardiomegaly.  No acute cardiopulmonary abnormality seen.   Original Report Authenticated By: Lupita Raider.,  M.D.          Subjective: Patient complains of left-sided tongue pain. Now she feels that her neck and face are also hurting. She denies any dysphasia. She denies any respiratory distress or worsening shortness of breath. There is no  fevers, chills, rashes. In fact, when I went to see the patient she was eating without difficulty. Denies coughing, hemoptysis, nausea, vomiting, diarrhea, abdominal pain.  Objective: Filed Vitals:   07/29/12 1352 07/29/12 2045 07/30/12 0548 07/30/12 1030  BP: 95/60 110/57 140/69 113/76  Pulse: 98 94 66 92  Temp: 97.9 F (36.6 C) 97.1 F (36.2 C) 97.1 F (36.2 C)   TempSrc: Oral Oral Oral   Resp: 18 18 20    Height:      Weight:   117.073 kg (258 lb 1.6 oz)   SpO2: 97% 97% 98%     Intake/Output Summary (Last 24 hours) at 07/30/12 1449 Last data filed at 07/30/12 1052  Gross per 24 hour  Intake    823 ml  Output   1775 ml  Net   -952 ml   Weight change: -1.089 kg (-2 lb 6.4 oz) Exam:   General:  Pt is alert, follows commands appropriately, not in acute distress  HEENT: Approximately 1 x 2 cm left tongue ulceration without any necrosis. Pharynx without any ulcerations or exudates. Neck is soft and supple without any masses or there is shotty lymphadenopathy. No facial edema or exophthalmos noted.  Cardiovascular: RRR, S1/S2,  Respiratory: Fine basilar crackles. No wheezes or rhonchi.  Abdomen: Soft/+BS, non tender, non distended, no guarding  Extremities: 2+ to 3+ edema, No lymphangitis, No petechiae, No rashes, no synovitis  Data Reviewed: Basic Metabolic Panel:  Lab 07/30/12 1478 07/29/12 0201 07/28/12 2111 07/28/12 1536  NA 138 142 -- 138  K 3.5 3.5 -- 4.9  CL 101 102 -- 99  CO2 25 27 -- 30  GLUCOSE 98 118* -- 171*  BUN 19 21 -- 23  CREATININE  1.12* 1.21* 1.27* 1.34*  CALCIUM 8.9 8.9 -- 9.6  MG -- -- 2.1 --  PHOS -- -- 3.3 --   Liver Function Tests: No results found for this basename: AST:5,ALT:5,ALKPHOS:5,BILITOT:5,PROT:5,ALBUMIN:5 in the last 168 hours No results found for this basename: LIPASE:5,AMYLASE:5 in the last 168 hours No results found for this basename: AMMONIA:5 in the last 168 hours CBC:  Lab 07/30/12 0630 07/29/12 0201 07/28/12 2111  07/28/12 1536 07/26/12 1421  WBC 4.7 5.0 5.1 5.5 5.0  NEUTROABS -- -- -- 3.5 3.2  HGB 11.9* 11.8* 13.1 12.8 11.9  HCT 37.7 36.9 40.6 40.2 37.3  MCV 95.0 95.3 96.2 95.9 97  PLT 104* 103* 121* 121* 113*   Cardiac Enzymes:  Lab 07/29/12 0820 07/29/12 0201 07/28/12 2112  CKTOTAL -- -- --  CKMB -- -- --  CKMBINDEX -- -- --  TROPONINI <0.30 <0.30 <0.30   BNP: No components found with this basename: POCBNP:5 CBG:  Lab 07/29/12 2146 07/29/12 1633 07/29/12 1139 07/29/12 0645 07/28/12 2154  GLUCAP 167* 144* 131* 137* 174*    Recent Results (from the past 240 hour(s))  MRSA PCR SCREENING     Status: Normal   Collection Time   07/29/12  2:45 AM      Component Value Range Status Comment   MRSA by PCR NEGATIVE  NEGATIVE Final      Scheduled Meds:   . amiodarone  100 mg Oral Daily  . aspirin EC  81 mg Oral Daily  . carvedilol  3.125 mg Oral BID WC  . donepezil  10 mg Oral QHS  . ferrous sulfate  325 mg Oral BID  . furosemide  80 mg Intravenous Q8H  . gabapentin  300 mg Oral Daily  . gabapentin  600 mg Oral QHS  . heparin  5,000 Units Subcutaneous Q8H  . insulin aspart  0-15 Units Subcutaneous TID WC  . insulin glargine  20 Units Subcutaneous Daily  . metolazone  2.5 mg Oral Daily  . omega-3 acid ethyl esters  1 g Oral Daily  . PARoxetine  40 mg Oral BH-q7a  . potassium chloride SA  40 mEq Oral TID  . simvastatin  10 mg Oral QHS  . sodium chloride  3 mL Intravenous Q12H  . triamcinolone   Mouth/Throat QID  . [DISCONTINUED] potassium chloride SA  20 mEq Oral TID  . [DISCONTINUED] sodium chloride  3 mL Intravenous Q12H   Continuous Infusions:    Tacey Dimaggio, DO  [facility] Pager [phone]  If 7PM-7AM, please contact night-coverage [email or web address] Password [email or web address] 07/30/2012, 2:49 PM   LOS: 2 days

## 2012-07-31 LAB — GLUCOSE, CAPILLARY
Glucose-Capillary: 117 mg/dL — ABNORMAL HIGH (ref 70–99)
Glucose-Capillary: 204 mg/dL — ABNORMAL HIGH (ref 70–99)

## 2012-07-31 LAB — BASIC METABOLIC PANEL
CO2: 32 mEq/L (ref 19–32)
Calcium: 9.2 mg/dL (ref 8.4–10.5)
Glucose, Bld: 131 mg/dL — ABNORMAL HIGH (ref 70–99)
Sodium: 140 mEq/L (ref 135–145)

## 2012-07-31 NOTE — Progress Notes (Signed)
PT Cancellation Note  Patient Details Name: [name] MRN: [id number] DOB: October 30, 1932   Cancelled Treatment:    Reason Eval/Treat Not Completed: Medical issues which prohibited therapy (Pt with HR 99 with very frequent PVC) Pt states her heart has been racing this morning and on tele frequent PVCs with monitor tech stating she has stayed in this rhythm with PVC too frequent to count.Will hold therapy at this time and attempt at later date.  Thanks Delaney Meigs, PT [phone]

## 2012-07-31 NOTE — Progress Notes (Signed)
[facility] PROGRESS NOTE  Claudean Leavelle [facility] YNW:[id number] DOB: [birth year] DOA: 07/28/2012 PCP: [name], MD  Assessment/Plan: Acute on chronic diastolic heart failure  -04/12/2012 echocardiogram shows ejection fraction 60-65%  -09/17/2008 echocardiogram shows ejection fraction 45%  -Continue furosemide 80 mg IV every 8 hours, metazolone -Fluid restriction, daily weights  -neg 5.3kg, -4994cc Elevated troponin  -Likely demand ischemia/CHF  -Repeat troponins negative  Facial/Tongue Pain  -??trigeminal neuralgia vs referred pain from tongue lesion  -ENT appt can be rescheduled when pt is ready for D/C -Exam did not reveal any facial edema or exophthalmos or airway compromise  -will try viscous lidocaine--pt refusing because of taste -pt eating 100% of meals without difficulty or dysphagia Hypertension  -Well controlled  -continue carvedilol  Diabetes mellitus type 2  -Continue Lantus 20 units at bedtime  -NovoLog sliding scale  -Accu-Cheks are controlled.  Deconditioning  -continue PT/OT -Consult social worker for skilled nursing facility placement  Chronic thrombocytopenia  -No evidence of active bleeding  -Continue to monitor      Family Communication:   Daughter updated at beside Disposition Plan:   SNF when medically stable      Procedures/Studies: Dg Chest 2 View  07/28/2012  *RADIOLOGY REPORT*  Clinical Data: Congestive heart failure.  CHEST - 2 VIEW  Comparison: April 11, 2012.  Findings: Moderate cardiomegaly is stable.  Sternotomy wires are noted.  Central pulmonary vascular congestion is unchanged.  No acute pulmonary disease is noted.  IMPRESSION: Cardiomegaly.  No acute cardiopulmonary abnormality seen.   Original Report Authenticated By: Lupita Raider.,  M.D.          Subjective: Patient denies any fevers, chills, chest pain, nausea, vomiting, diarrhea. She states her breathing is better. Has some dyspnea on exertion. No abdominal pain. No  rashes. No dysuria.  Objective: Filed Vitals:   07/30/12 2144 07/31/12 0553 07/31/12 1015 07/31/12 1412  BP: 115/76 126/78 104/68 121/78  Pulse: 98 92 84 99  Temp: 97.1 F (36.2 C) 97.8 F (36.6 C)  97.3 F (36.3 C)  TempSrc: Oral Oral  Oral  Resp: 20 20  20   Height:      Weight:  113.581 kg (250 lb 6.4 oz)    SpO2: 97% 97%  96%    Intake/Output Summary (Last 24 hours) at 07/31/12 1858 Last data filed at 07/31/12 1547  Gross per 24 hour  Intake    960 ml  Output   3300 ml  Net  -2340 ml   Weight change: -3.493 kg (-7 lb 11.2 oz) Exam:   General:  Pt is alert, follows commands appropriately, not in acute distress  HEENT: No icterus, No thrush, No neck mass, [place]/AT  Cardiovascular: RRR, S1/S2, no rubs, no gallops  Respiratory: Fine bibasilar crackles. Good air movement. No wheezes or rhonchi.  Abdomen: Soft/+BS, non tender, non distended, no guarding  Extremities: 2+ edema, No lymphangitis, No petechiae, No rashes, no synovitis  Data Reviewed: Basic Metabolic Panel:  Lab 07/31/12 6578 07/30/12 0630 07/29/12 0201 07/28/12 2111 07/28/12 1536  NA 140 138 142 -- 138  K 3.4* 3.5 3.5 -- 4.9  CL 98 101 102 -- 99  CO2 32 25 27 -- 30  GLUCOSE 131* 98 118* -- 171*  BUN 20 19 21  -- 23  CREATININE 1.23* 1.12* 1.21* 1.27* 1.34*  CALCIUM 9.2 8.9 8.9 -- 9.6  MG -- -- -- 2.1 --  PHOS -- -- -- 3.3 --   Liver Function Tests: No results found for this basename:  AST:5,ALT:5,ALKPHOS:5,BILITOT:5,PROT:5,ALBUMIN:5 in the last 168 hours No results found for this basename: LIPASE:5,AMYLASE:5 in the last 168 hours No results found for this basename: AMMONIA:5 in the last 168 hours CBC:  Lab 07/30/12 0630 07/29/12 0201 07/28/12 2111 07/28/12 1536 07/26/12 1421  WBC 4.7 5.0 5.1 5.5 5.0  NEUTROABS -- -- -- 3.5 3.2  HGB 11.9* 11.8* 13.1 12.8 11.9  HCT 37.7 36.9 40.6 40.2 37.3  MCV 95.0 95.3 96.2 95.9 97  PLT 104* 103* 121* 121* 113*   Cardiac Enzymes:  Lab 07/29/12 0820 07/29/12  0201 07/28/12 2112  CKTOTAL -- -- --  CKMB -- -- --  CKMBINDEX -- -- --  TROPONINI <0.30 <0.30 <0.30   BNP: No components found with this basename: POCBNP:5 CBG:  Lab 07/31/12 1619 07/31/12 1120 07/31/12 0602 07/30/12 2103 07/30/12 1610  GLUCAP 121* 204* 117* 132* 158*    Recent Results (from the past 240 hour(s))  MRSA PCR SCREENING     Status: Normal   Collection Time   07/29/12  2:45 AM      Component Value Range Status Comment   MRSA by PCR NEGATIVE  NEGATIVE Final      Scheduled Meds:   . amiodarone  100 mg Oral Daily  . aspirin EC  81 mg Oral Daily  . carvedilol  3.125 mg Oral BID WC  . donepezil  10 mg Oral QHS  . ferrous sulfate  325 mg Oral BID  . furosemide  80 mg Intravenous Q8H  . gabapentin  300 mg Oral Daily  . gabapentin  600 mg Oral QHS  . heparin  5,000 Units Subcutaneous Q8H  . insulin aspart  0-15 Units Subcutaneous TID WC  . insulin glargine  20 Units Subcutaneous Daily  . lidocaine  20 mL Mouth/Throat TID AC & HS  . metolazone  2.5 mg Oral Daily  . omega-3 acid ethyl esters  1 g Oral Daily  . PARoxetine  40 mg Oral BH-q7a  . potassium chloride SA  40 mEq Oral TID  . simvastatin  10 mg Oral QHS  . sodium chloride  3 mL Intravenous Q12H  . triamcinolone   Mouth/Throat QID   Continuous Infusions:    Bookert Guzzi, DO  [facility] Pager [phone]  If 7PM-7AM, please contact night-coverage [email or web address] Password [email or web address] 07/31/2012, 6:58 PM   LOS: 3 days

## 2012-07-31 NOTE — Progress Notes (Signed)
UR done. 

## 2012-07-31 NOTE — Progress Notes (Addendum)
Subjective:  Doing well. No significant SOB. Decreased edema. She has lost about 10 pounds.   Still has questions about mouth ulcer, PT...  Had an episode of tachycardia when getting around her room. Now stable.   Objective:  Vital Signs in the last 24 hours: Temp:  [97.1 F (36.2 C)-97.8 F (36.6 C)] 97.3 F (36.3 C) (11/04 1412) Pulse Rate:  [84-99] 99  (11/04 1412) Resp:  [20] 20  (11/04 1412) BP: (104-126)/(68-78) 121/78 mmHg (11/04 1412) SpO2:  [96 %-97 %] 96 % (11/04 1412) Weight:  [113.581 kg (250 lb 6.4 oz)] 113.581 kg (250 lb 6.4 oz) (11/04 0553)  Intake/Output from previous day: 11/03 0701 - 11/04 0700 In: 1123 [P.O.:1120; I.V.:3] Out: 2900 [Urine:2900]   Physical Exam: General: Well developed, well nourished, in no acute distress. Head:  Normocephalic and atraumatic. Lungs: Clear to auscultation and percussion. Heart: Normal S1 and S2.  No murmur, rubs or gallops.  Abdomen: soft, non-tender, positive bowel sounds, obese. Extremities: No clubbing or cyanosis. No edema. Neurologic: Alert and oriented x 3.    Lab Results:  Basename 07/30/12 0630 07/29/12 0201  WBC 4.7 5.0  HGB 11.9* 11.8*  PLT 104* 103*    Basename 07/31/12 0550 07/30/12 0630  NA 140 138  K 3.4* 3.5  CL 98 101  CO2 32 25  GLUCOSE 131* 98  BUN 20 19  CREATININE 1.23* 1.12*    Basename 07/29/12 0820 07/29/12 0201  TROPONINI <0.30 <0.30   Hepatic Function Panel No results found for this basename: PROT,ALBUMIN,AST,ALT,ALKPHOS,BILITOT,BILIDIR,IBILI in the last 72 hours No results found for this basename: CHOL in the last 72 hours No results found for this basename: PROTIME in the last 72 hours  Imaging: No results found. Personally viewed.     Assessment/Plan:  Principal Problem:  *CHF (congestive heart failure) Active Problems:  HYPERLIPIDEMIA  MAJOR DEPRESSIVE DISORDER RECURRENT EPISODE MILD  SLEEP APNEA, OBSTRUCTIVE, MILD  HYPERTENSION, CONTROLLED  PAROXYSMAL ATRIAL  FIBRILLATION  Muscle weakness (generalized)  AORTIC VALVE REPLACEMENT, HX OF  Elevated troponin  Acute diastolic CHF (congestive heart failure)  Tongue ulcer   -Improved edema -Creat slightly elevated -Continue with current plan of metolazone 2.5mg  QD and lasix 80 IV Q 8.  -Encourage PT -Heart rate currently stable. Continue amio.  -Deconditioned.Marland KitchenMarland KitchenPT  Appreciate Dr. Arbutus Leas working with ENT.   [name] 07/31/2012, 6:39 PM

## 2012-07-31 NOTE — Progress Notes (Signed)
OT Cancellation Note  Patient Details Name: [name] MRN: [id number] DOB: 03/23/1933   Cancelled Treatment:    Reason Eval/Treat Not Completed: Medical issues which prohibited therapy (cancel due to PVC holding OT eval until 08/01/12)  Lucile Shutters Pager: [phone]  07/31/2012, 1:31 PM

## 2012-07-31 NOTE — Plan of Care (Signed)
Problem: Phase I Progression Outcomes Goal: Pain controlled with appropriate interventions Outcome: Completed/Met Date Met:  07/31/12 Pt has not had any c/o c/p, goal met, will continue to monitor   Problem: Phase II Progression Outcomes Goal: Pain controlled Outcome: Completed/Met Date Met:  07/31/12 Pt has not had any c/o pain, goal met, will monitor  Goal: Tolerating diet Outcome: Completed/Met Date Met:  07/31/12 Pt on heart healthy diet, pt tolerating well, no c/o n/v, has good appetite, goal met, will monitor

## 2012-08-01 DIAGNOSIS — E119 Type 2 diabetes mellitus without complications: Secondary | ICD-10-CM

## 2012-08-01 DIAGNOSIS — E876 Hypokalemia: Secondary | ICD-10-CM

## 2012-08-01 LAB — CBC
HCT: 36.6 % (ref 36.0–46.0)
Hemoglobin: 11.9 g/dL — ABNORMAL LOW (ref 12.0–15.0)
MCH: 30.5 pg (ref 26.0–34.0)
MCHC: 32.5 g/dL (ref 30.0–36.0)
RDW: 16.2 % — ABNORMAL HIGH (ref 11.5–15.5)

## 2012-08-01 LAB — PROTIME-INR
INR: 1.18 (ref 0.00–1.49)
Prothrombin Time: 14.8 seconds (ref 11.6–15.2)

## 2012-08-01 LAB — BASIC METABOLIC PANEL
BUN: 20 mg/dL (ref 6–23)
Chloride: 96 mEq/L (ref 96–112)
Creatinine, Ser: 1.25 mg/dL — ABNORMAL HIGH (ref 0.50–1.10)
Glucose, Bld: 141 mg/dL — ABNORMAL HIGH (ref 70–99)
Potassium: 3.3 mEq/L — ABNORMAL LOW (ref 3.5–5.1)

## 2012-08-01 LAB — APTT: aPTT: 40 seconds — ABNORMAL HIGH (ref 24–37)

## 2012-08-01 LAB — GLUCOSE, CAPILLARY
Glucose-Capillary: 123 mg/dL — ABNORMAL HIGH (ref 70–99)
Glucose-Capillary: 164 mg/dL — ABNORMAL HIGH (ref 70–99)
Glucose-Capillary: 157 mg/dL — ABNORMAL HIGH (ref 70–99)

## 2012-08-01 MED ORDER — SPIRONOLACTONE 12.5 MG HALF TABLET
12.5000 mg | ORAL_TABLET | Freq: Every day | ORAL | Status: DC
  Administered 2012-08-01 – 2012-08-04 (×4): 12.5 mg via ORAL
  Filled 2012-08-01 (×5): qty 1

## 2012-08-01 MED ORDER — INSULIN GLARGINE 100 UNIT/ML ~~LOC~~ SOLN
24.0000 [IU] | Freq: Every day | SUBCUTANEOUS | Status: DC
  Administered 2012-08-01 – 2012-08-03 (×3): 24 [IU] via SUBCUTANEOUS

## 2012-08-01 NOTE — Progress Notes (Signed)
Physical Therapy Treatment Patient Details Name: [name] MRN: [id number] DOB: 08/02/1933 Today's Date: 08/01/2012 Time: 7829-5621 PT Time Calculation (min): 38 min  PT Assessment / Plan / Recommendation Comments on Treatment Session  Pt admitted with CHF and dgtr present throughout session today stating pt actually ambulating better than baseline currently. Dgtr also stated that pt tends to state and appear more disabled for dgtr than she does for other family and caregivers and dgtr admits to enabling pt with this behavior. Pt continues to require supervision with mobility for safety with DME and to increase activity. Pt states she is agreeable to ST-SNF but does not plan to stay more than a week. Pt encouraged to continue mobility and HEP with nursing.     Follow Up Recommendations  Home health PT;Supervision for mobility/OOB;ST-SNF may be beneficial given lack of 24hr care and pt now agreeable to additional therapy and dgtr reports pt needs to be more mobile to be safe at home     Does the patient have the potential to tolerate intense rehabilitation     Barriers to Discharge        Equipment Recommendations  None recommended by PT    Recommendations for Other Services    Frequency     Plan Discharge plan needs to be updated;Frequency remains appropriate    Precautions / Restrictions Precautions Precautions: Fall   Pertinent Vitals/Pain No pain HR 100-110 with activity no CP or SOB    Mobility  Bed Mobility Bed Mobility: Sit to Supine Supine to Sit: 5: Supervision;HOB flat;With rails Details for Bed Mobility Assistance: increased time with cueing for sequence with ease of transfer and definite need for use of rail to enter bed. Pt dgtr present stating pt generally sleeps in recliner even though bed at home has rails Transfers Transfers: Sit to Stand;Stand to Sit Sit to Stand: 6: Modified independent (Device/Increase time);From chair/3-in-1;From toilet Stand to Sit: 6:  Modified independent (Device/Increase time);To toilet;To chair/3-in-1 Details for Transfer Assistance: use of armrests and pt able to complete without cueing or assist Ambulation/Gait Ambulation/Gait Assistance: 5: Supervision Ambulation Distance (Feet): 110 Feet Assistive device: Standard walker Ambulation/Gait Assistance Details: cueing for sequence of gait , recommended wheels for walker for home due to increased energy expenditure of StdW Gait Pattern: Step-through pattern;Decreased stride length;Wide base of support;Trunk flexed Gait velocity: decreased Stairs: No    Exercises General Exercises - Lower Extremity Hip Flexion/Marching: AROM;Both;20 reps;Seated   PT Diagnosis:    PT Problem List:   PT Treatment Interventions:     PT Goals Acute Rehab PT Goals PT Goal: Sit to Supine/Side - Progress: Progressing toward goal PT Goal: Ambulate - Progress: Progressing toward goal PT Goal: Perform Home Exercise Program - Progress: Progressing toward goal  Visit Information  Last PT Received On: 08/01/12 Assistance Needed: +1    Subjective Data  Subjective: "I guess I've just been sleepy today"   Cognition  Overall Cognitive Status: Appears within functional limits for tasks assessed/performed Arousal/Alertness: Awake/alert Orientation Level: Appears intact for tasks assessed Behavior During Session: [facility] for tasks performed    Balance  Static Sitting Balance Static Sitting - Balance Support: Bilateral upper extremity supported;Feet supported Static Sitting - Level of Assistance: 5: Stand by assistance Static Sitting - Comment/# of Minutes: 3 min with posterior lean during bil LE exercises despite cueing for upright posture Static Standing Balance Static Standing - Balance Support: No upper extremity supported Static Standing - Level of Assistance: 5: Stand by assistance Static Standing -  Comment/# of Minutes: 3 min during functional activity brushing teeth  End of Session PT  - End of Session Activity Tolerance: Patient tolerated treatment well Patient left: in bed;with call bell/phone within reach;with family/visitor present   GP     Toney Sang Beth 08/01/2012, 3:19 PM Delaney Meigs, PT [phone]

## 2012-08-01 NOTE — Evaluation (Signed)
Occupational Therapy Evaluation Patient Details Name: [name] MRN: [id number] DOB: 10-10-1932 Today's Date: 08/01/2012 Time: 0815-0900 OT Time Calculation (min): 45 min  OT Assessment / Plan / Recommendation Clinical Impression  Pleasant 78yr old female admitted for CHF exacerbation.  Overall presents with slight increased dependence for basic selfcare tasks compared to her normal baseline.  Overall is at a min assist to supervision level.  Will benefit from acute OT services to help progress to supervision/modified independent level for ADLS.  Feel she would benefit from short term SNF rehab to improve safety and consistnecy with selfcare tasks as well as working on simple homemanagement tasks, since the pt does not have 24 hour supervision.    OT Assessment  Patient needs continued OT Services    Follow Up Recommendations  Skilled nursing facility    Barriers to Discharge Decreased caregiver support does not have 24 hour supervision  Equipment Recommendations  None recommended by OT       Frequency  Min 2X/week    Precautions / Restrictions Precautions Precautions: Fall Restrictions Weight Bearing Restrictions: No   Pertinent Vitals/Pain HR 99-110 with activity    ADL  Eating/Feeding: Simulated;Independent Where Assessed - Eating/Feeding: Chair Grooming: Performed;Min guard Where Assessed - Grooming: Supported standing Upper Body Bathing: Simulated;Set up Where Assessed - Upper Body Bathing: Unsupported sitting Lower Body Bathing: Simulated;Minimal assistance Where Assessed - Lower Body Bathing: Supported sit to stand Upper Body Dressing: Simulated;Set up Where Assessed - Upper Body Dressing: Unsupported sitting Lower Body Dressing: Performed;Minimal assistance Where Assessed - Lower Body Dressing: Supported sit to stand Toilet Transfer: Performed;Min guard Statistician Method: Surveyor, minerals: Raised toilet seat with arms (or 3-in-1 over  toilet) Toileting - Clothing Manipulation and Hygiene: Performed;Min guard Where Assessed - Engineer, mining and Hygiene: Sit to stand from 3-in-1 or toilet Tub/Shower Transfer Method: Not assessed Equipment Used: Rolling walker Transfers/Ambulation Related to ADLs: Pt overall min guard assist for mobility using the RW to the bathroom and the sink. ADL Comments: Pt with decreased efficiency when attempting to reach her feet for donning gripper socks.  She needed to prop her LEs up on a chair and still needed increased time and exhibited difficulty.  Pt reports being familiar with a reacher but not a socka aide.  She reports very rarely wearing socks at home anyways..      OT Diagnosis: Generalized weakness  OT Problem List: Decreased strength;Decreased activity tolerance;Decreased safety awareness;Impaired balance (sitting and/or standing);Decreased knowledge of use of DME or AE OT Treatment Interventions: Self-care/ADL training;Therapeutic activities;Therapeutic exercise;DME and/or AE instruction;Balance training;Patient/family education   OT Goals Acute Rehab OT Goals OT Goal Formulation: With patient Time For Goal Achievement: 08/15/12 ADL Goals Pt Will Perform Lower Body Bathing: with supervision;Sit to stand from chair;with adaptive equipment;Sit to stand from bed ADL Goal: Lower Body Bathing - Progress: Goal set today Pt Will Perform Lower Body Dressing: with supervision;Sit to stand from bed;with adaptive equipment;Sit to stand from chair ADL Goal: Lower Body Dressing - Progress: Goal set today Pt Will Transfer to Toilet: with supervision;with DME;3-in-1 ADL Goal: Toilet Transfer - Progress: Goal set today Pt Will Perform Tub/Shower Transfer: with supervision;with DME;Shower seat without back ADL Goal: Web designer - Progress: Goal set today Miscellaneous OT Goals Miscellaneous OT Goal #1: Pt will perform bilateral UE therex with supervision to help increase  overall endurance for basic selfcare tasks. OT Goal: Miscellaneous Goal #1 - Progress: Goal set today  Visit Information  Last OT  Received On: 08/01/12 Assistance Needed: +1    Subjective Data  Subjective: "They want me to go to rehab for a week or two." Patient Stated Goal: Hopefully to go home from the hospital and not have to go to rehab.   Prior Functioning     Home Living Lives With: Daughter Available Help at Discharge: Personal care attendant;Available PRN/intermittently;Family Type of Home: House Home Access: Ramped entrance Home Layout: One level Bathroom Shower/Tub: Engineer, manufacturing systems: Standard Home Adaptive Equipment: Bedside commode/3-in-1;Tub transfer bench;Walker - rolling;Wheelchair - manual;Straight cane;Reacher Prior Function Level of Independence: Needs assistance Needs Assistance: Bathing;Dressing;Meal Prep;Light Housekeeping Bath: Minimal Dressing: Minimal Meal Prep: Maximal Light Housekeeping: Total Able to Take Stairs?: No Driving: No Vocation: Retired Musician: No difficulties Dominant Hand: Right         Vision/Perception Vision - Assessment Eye Alignment: Within Functional Limits Vision Assessment: Vision not tested Perception Perception: Within Functional Limits Praxis Praxis: Intact   Cognition  Overall Cognitive Status: Appears within functional limits for tasks assessed/performed Arousal/Alertness: Awake/alert Orientation Level: Appears intact for tasks assessed Behavior During Session: [facility] for tasks performed    Extremity/Trunk Assessment Right Upper Extremity Assessment RUE ROM/Strength/Tone: Within functional levels RUE Sensation: WFL - Light Touch RUE Coordination: WFL - gross/fine motor Left Upper Extremity Assessment LUE ROM/Strength/Tone: Within functional levels LUE Sensation: WFL - Light Touch LUE Coordination: WFL - gross/fine motor Trunk Assessment Trunk Assessment: Kyphotic       Mobility Bed Mobility Bed Mobility: Supine to Sit Supine to Sit: 5: Supervision Transfers Transfers: Sit to Stand Sit to Stand: 4: Min guard;With upper extremity assist;4: Min assist;From chair/3-in-1 Stand to Sit: 4: Min guard;Without upper extremity assist Details for Transfer Assistance: Pt needed min instructional cueing for hand placement on the chair when attempting to stand.           Balance Balance Balance Assessed: Yes Dynamic Standing Balance Dynamic Standing - Balance Support: Right upper extremity supported;Left upper extremity supported Dynamic Standing - Level of Assistance: 5: Stand by assistance   End of Session OT - End of Session Equipment Utilized During Treatment: Gait belt Activity Tolerance: Patient tolerated treatment well Patient left: in chair;with call bell/phone within reach Nurse Communication: Mobility status     Curvin Hunger OTR/L Pager number [phone] 08/01/2012, 9:15 AM

## 2012-08-01 NOTE — Progress Notes (Signed)
Notified by nurse the patient had some blood mixed in with some stool this afternoon. Patient is asymptomatic. Vitals are stable. Hemoglobin has remained stable this hospitalization. Patient has history of hemorrhoids, and she states this has happened before. Continue to monitor hemoglobin, check coags in the morning.

## 2012-08-01 NOTE — Progress Notes (Signed)
Subjective:  OK. No signif SOB. Weight down about 16 pounds.   Objective:  Vital Signs in the last 24 hours: Temp:  [97.3 F (36.3 C)-98.2 F (36.8 C)] 97.8 F (36.6 C) (11/05 0437) Pulse Rate:  [84-101] 101  (11/05 0637) Resp:  [20] 20  (11/05 0437) BP: (104-127)/(60-83) 127/83 mmHg (11/05 0637) SpO2:  [95 %-96 %] 95 % (11/05 0437) Weight:  [110.36 kg (243 lb 4.8 oz)] 110.36 kg (243 lb 4.8 oz) (11/05 0437)  Intake/Output from previous day: 11/04 0701 - 11/05 0700 In: 1160 [P.O.:1160] Out: 5250 [Urine:5250]   Physical Exam: General: Well developed, well nourished, in no acute distress. Head:  Normocephalic and atraumatic. Lungs: Clear to auscultation and percussion. Heart: Normal S1 and S2.  No murmur, rubs or gallops.  Abdomen: soft, non-tender, positive bowel sounds. Extremities: No clubbing or cyanosis. Chronic 1-2+ edema. Neurologic: Alert and oriented x 3.    Lab Results:  Basename 08/01/12 0600 07/30/12 0630  WBC 4.8 4.7  HGB 11.9* 11.9*  PLT 93* 104*    Basename 08/01/12 0600 07/31/12 0550  NA 137 140  K 3.3* 3.4*  CL 96 98  CO2 34* 32  GLUCOSE 141* 131*  BUN 20 20  CREATININE 1.25* 1.23*   Telemetry: Yesterday, SVT noted Personally viewed.    Assessment/Plan:  Principal Problem:  *CHF (congestive heart failure) Active Problems:  HYPERLIPIDEMIA  MAJOR DEPRESSIVE DISORDER RECURRENT EPISODE MILD  SLEEP APNEA, OBSTRUCTIVE, MILD  HYPERTENSION, CONTROLLED  PAROXYSMAL ATRIAL FIBRILLATION  Muscle weakness (generalized)  AORTIC VALVE REPLACEMENT, HX OF  Elevated troponin  Acute diastolic CHF (congestive heart failure)  Tongue ulcer   -Hypokalemia - I added low dose spirionolactone 12.5mg  QD. Getting KCL 40 TID. Monitor closely  -Net out 8L. Continue with current IV lasix 80 Q8, metolazone 2.5 QD. Creat mildly increased. Tomorrow may have to cut back on diuresis.   Appreciate Dr. Arbutus Leas care.   Jefferson Fullam 08/01/2012, 8:21 AM

## 2012-08-01 NOTE — Progress Notes (Addendum)
Pt had wide QRS and asymptomatic.  HR15's.  .  122/80, P88.  Dr. Mayford Knife made aware.  Will come see pt.  Amanda Pea, Charity fundraiser.

## 2012-08-01 NOTE — Progress Notes (Signed)
Pt HR 158  While PT attempting to get her back to bed from chair.  Pt asymptomatic  BP118/73 P 101.  R20.   Dr. Arbutus Leas informed.  Instructed will come by to see pt.  Pt care taker from home at the bedside.  Amanda Pea, Charity fundraiser.

## 2012-08-01 NOTE — Clinical Social Work Placement (Addendum)
Clinical Social Work Department CLINICAL SOCIAL WORK PLACEMENT NOTE 08/01/2012  Patient:  [name], [name]  Account Number:  [id number] Admit date:  07/28/2012  Clinical Social Worker:  Kandas Oliveto Lubertha Basque  Date/time:  08/01/2012 01:00 PM  Clinical Social Work is seeking post-discharge placement for this patient at the following level of care:   SKILLED NURSING   (*CSW will update this form in Epic as items are completed)   08/01/2012  Patient/family provided with [facility] Department of Clinical Social Work's list of facilities offering this level of care within the geographic area requested by the patient (or if unable, by the patient's family).  08/01/2012  Patient/family informed of their freedom to choose among providers that offer the needed level of care, that participate in Medicare, Medicaid or managed care program needed by the patient, have an available bed and are willing to accept the patient.  08/01/2012  Patient/family informed of [facility]' ownership interest in [facility], as well as of the fact that they are under no obligation to receive care at this facility.  PASARR submitted to EDS on 08/01/2012 PASARR number received from EDS on 08/01/2012  FL2 transmitted to all facilities in geographic area requested by pt/family on  08/01/2012 FL2 transmitted to all facilities within larger geographic area on   Patient informed that his/her managed care company has contracts with or will negotiate with  certain facilities, including the following:     Patient/family informed of bed offers received:  08/02/2012  Patient chooses bed at [facility] Physician recommends and patient chooses bed at    Patient to be transferred to  on   Patient to be transferred to facility by   The following physician request were entered in Epic:   Additional Comments:  Sabino Niemann, MSW, LCSWA [phone]

## 2012-08-01 NOTE — Progress Notes (Signed)
[facility] PROGRESS NOTE  [name] [facility] ZOX:[id number] DOB: 05-28-1933 DOA: 07/28/2012 PCP: [name], MD  Assessment/Plan: Acute on chronic diastolic heart failure  -04/12/2012 echocardiogram shows ejection fraction 60-65%  -09/17/2008 echocardiogram shows ejection fraction 45%  -Continue furosemide 80 mg IV every 8 hours, metazolone  -Fluid restriction, daily weights  -neg 8.8kg, -7924cc  -aldactone added -Patient having sinus tachycardia up to 150s during physical therapy, resting heart rate upper 90s; may need to increase carvedilol if patient can tolerate Elevated troponin  -Likely demand ischemia/CHF  -Repeat troponins negative  Facial/Tongue Pain  -??trigeminal neuralgia vs referred pain from tongue lesion  -I have called to reschedule her ENT appt for 08/08/2012 at 3:10 PM -Exam did not reveal any facial edema or exophthalmos or airway compromise  -will try viscous lidocaine--pt refusing because of taste  -pt eating 100% of meals without difficulty or dysphagia  Hypertension  -Well controlled  -continue carvedilol  Diabetes mellitus type 2  -increase to Lantus 24 units at bedtime  -NovoLog sliding scale  -Accu-Checks are much improved.  -Hemoglobin A1c 8.1 Deconditioning  -continue PT/OT  -Child psychotherapist for skilled nursing facility placement  Chronic thrombocytopenia  -No evidence of active bleeding  -Continue to monitor  Family Communication: Daughter updated  Disposition Plan: SNF when medically stable         Family Communication:   Pt at beside Disposition Plan:   Home when medically stable     Procedures/Studies: Dg Chest 2 View  07/28/2012  *RADIOLOGY REPORT*  Clinical Data: Congestive heart failure.  CHEST - 2 VIEW  Comparison: April 11, 2012.  Findings: Moderate cardiomegaly is stable.  Sternotomy wires are noted.  Central pulmonary vascular congestion is unchanged.  No acute pulmonary disease is noted.  IMPRESSION: Cardiomegaly.  No  acute cardiopulmonary abnormality seen.   Original Report Authenticated By: Lupita Raider.,  M.D.          Subjective: Patient participated in physical therapy today. Has some dyspnea on exertion. Denies any fevers, chills, chest pain, nausea, vomiting, diarrhea, abdominal pain, dysuria, rashes.  Objective: Filed Vitals:   08/01/12 0437 08/01/12 0637 08/01/12 0900 08/01/12 1511  BP: 123/65 127/83    Pulse: 100 101 100 112  Temp: 97.8 F (36.6 C)     TempSrc: Oral     Resp: 20     Height:      Weight: 110.36 kg (243 lb 4.8 oz)     SpO2: 95%  94%     Intake/Output Summary (Last 24 hours) at 08/01/12 1649 Last data filed at 08/01/12 1215  Gross per 24 hour  Intake    820 ml  Output   4650 ml  Net  -3830 ml   Weight change: -3.221 kg (-7 lb 1.6 oz) Exam:   General:  Pt is alert, follows commands appropriately, not in acute distress  HEENT: No icterus, No thrush,[place]/AT; tongue ulcer without any ulceration or drainage  Cardiovascular: RRR, S1/S2, no rubs, no gallops  Respiratory: Bibasilar crackles. No wheezes or rhonchi. Good air movemeny  Abdomen: Soft/+BS, non tender, non distended, no guarding  Extremities:  1+edema, No lymphangitis, No petechiae, No rashes, no synovitis  Data Reviewed: Basic Metabolic Panel:  Lab 08/01/12 8119 07/31/12 0550 07/30/12 0630 07/29/12 0201 07/28/12 2111 07/28/12 1536  NA 137 140 138 142 -- 138  K 3.3* 3.4* 3.5 3.5 -- 4.9  CL 96 98 101 102 -- 99  CO2 34* 32 25 27 -- 30  GLUCOSE 141* 131* 98  118* -- 171*  BUN 20 20 19 21  -- 23  CREATININE 1.25* 1.23* 1.12* 1.21* 1.27* --  CALCIUM 9.5 9.2 8.9 8.9 -- 9.6  MG -- -- -- -- 2.1 --  PHOS -- -- -- -- 3.3 --   Liver Function Tests: No results found for this basename: AST:5,ALT:5,ALKPHOS:5,BILITOT:5,PROT:5,ALBUMIN:5 in the last 168 hours No results found for this basename: LIPASE:5,AMYLASE:5 in the last 168 hours No results found for this basename: AMMONIA:5 in the last 168  hours CBC:  Lab 08/01/12 0600 07/30/12 0630 07/29/12 0201 07/28/12 2111 07/28/12 1536  WBC 4.8 4.7 5.0 5.1 5.5  NEUTROABS -- -- -- -- 3.5  HGB 11.9* 11.9* 11.8* 13.1 12.8  HCT 36.6 37.7 36.9 40.6 40.2  MCV 93.8 95.0 95.3 96.2 95.9  PLT 93* 104* 103* 121* 121*   Cardiac Enzymes:  Lab 07/29/12 0820 07/29/12 0201 07/28/12 2112  CKTOTAL -- -- --  CKMB -- -- --  CKMBINDEX -- -- --  TROPONINI <0.30 <0.30 <0.30   BNP: No components found with this basename: POCBNP:5 CBG:  Lab 08/01/12 1626 08/01/12 1148 08/01/12 0643 07/31/12 2226 07/31/12 1619  GLUCAP 164* 145* 123* 140* 121*    Recent Results (from the past 240 hour(s))  MRSA PCR SCREENING     Status: Normal   Collection Time   07/29/12  2:45 AM      Component Value Range Status Comment   MRSA by PCR NEGATIVE  NEGATIVE Final      Scheduled Meds:   . amiodarone  100 mg Oral Daily  . aspirin EC  81 mg Oral Daily  . carvedilol  3.125 mg Oral BID WC  . donepezil  10 mg Oral QHS  . ferrous sulfate  325 mg Oral BID  . furosemide  80 mg Intravenous Q8H  . gabapentin  300 mg Oral Daily  . gabapentin  600 mg Oral QHS  . heparin  5,000 Units Subcutaneous Q8H  . insulin aspart  0-15 Units Subcutaneous TID WC  . insulin glargine  20 Units Subcutaneous Daily  . lidocaine  20 mL Mouth/Throat TID AC & HS  . metolazone  2.5 mg Oral Daily  . omega-3 acid ethyl esters  1 g Oral Daily  . PARoxetine  40 mg Oral BH-q7a  . potassium chloride SA  40 mEq Oral TID  . simvastatin  10 mg Oral QHS  . sodium chloride  3 mL Intravenous Q12H  . spironolactone  12.5 mg Oral Daily  . triamcinolone   Mouth/Throat QID   Continuous Infusions:    Hadeel Hillebrand, DO  [facility] Pager [phone]  If 7PM-7AM, please contact night-coverage [email or web address] Password [email or web address] 08/01/2012, 4:49 PM   LOS: 4 days

## 2012-08-01 NOTE — Progress Notes (Signed)
Noted pt  Had small bloody drainage in urine.  Pt stated " It  Came from my hemorrhoid, I ve had it before and use some cream at home.   Assessed pt's bottom no further bleeding noted.  Dr Tat made aware.  MD to f/u.  Will continue to monitor.  Amanda Pea, Charity fundraiser.

## 2012-08-01 NOTE — Clinical Social Work Psychosocial (Signed)
Clinical Social Work Department BRIEF PSYCHOSOCIAL ASSESSMENT 08/02/2012  Patient:  [name], [name]     Account Number:  [id number]     Admit date:  07/28/2012  Clinical Social Worker:  Juliette Mangle  Date/Time:  08/01/2012 01:00 PM  Referred by:  Physician  Date Referred:  08/01/2012 Referred for  SNF Placement   Other Referral:   Interview type:  Patient Other interview type:    PSYCHOSOCIAL DATA Living Status:  FAMILY Admitted from facility:   Level of care:   Primary support name:  Hardy,[name] Primary support relationship to patient:  CHILD, ADULT Degree of support available:   Strong and vested. One daughter works at [employer].    CURRENT CONCERNS Current Concerns  Post-Acute Placement   Other Concerns:    SOCIAL WORK ASSESSMENT / PLAN Clinical Social Worker received referral for the potential need for post acute placement at a SNF. CSW reviewed chart and met with patient at bedside. CSW introduced self, explained role, and provided emotional support. CSW discussed skilled nursing home placement, reviewed the process of placement and answered all the patient's questions. After reviewing the SNF list, Patient reported that she is agreeable to CSW seeking SNF placement and would like to be placed at [facility] where her daughter works. CSW encouraged patient to ask questions as needed of CSW and medical staff. CSW will begin the placement process and will continue to follow and assist with all d/c planning.   Assessment/plan status:  Psychosocial Support/Ongoing Assessment of Needs Other assessment/ plan:   Information/referral to community resources:   None noted    PATIENT'S/FAMILY'S RESPONSE TO PLAN OF CARE: Patient was very appreciative of support and information provided by CSW. Patient is interested in being placed at [facility]. CSW will continue to follow and will assist with all d/c planning needs     Sabino Niemann, MSW, LCSWA [phone]

## 2012-08-02 ENCOUNTER — Inpatient Hospital Stay (HOSPITAL_COMMUNITY): Payer: Medicare Other

## 2012-08-02 LAB — CBC
MCH: 30.8 pg (ref 26.0–34.0)
Platelets: 101 10*3/uL — ABNORMAL LOW (ref 150–400)
RBC: 4 MIL/uL (ref 3.87–5.11)
WBC: 5.4 10*3/uL (ref 4.0–10.5)

## 2012-08-02 LAB — BASIC METABOLIC PANEL
Calcium: 9.7 mg/dL (ref 8.4–10.5)
GFR calc Af Amer: 46 mL/min — ABNORMAL LOW (ref >90)
GFR calc non Af Amer: 39 mL/min — ABNORMAL LOW (ref >90)
Sodium: 139 mEq/L (ref 135–145)

## 2012-08-02 LAB — GLUCOSE, CAPILLARY: Glucose-Capillary: 182 mg/dL — ABNORMAL HIGH (ref 70–99)

## 2012-08-02 MED ORDER — IOHEXOL 300 MG/ML  SOLN
75.0000 mL | Freq: Once | INTRAMUSCULAR | Status: AC | PRN
  Administered 2012-08-02: 75 mL via INTRAVENOUS

## 2012-08-02 NOTE — Progress Notes (Addendum)
Subjective:  No SOB. Less swelling. No SOB. My face bothers me.   Objective:  Vital Signs in the last 24 hours: Temp:  [97.5 F (36.4 C)-97.6 F (36.4 C)] 97.6 F (36.4 C) (11/06 0605) Pulse Rate:  [99-112] 99  (11/06 0605) Resp:  [20-22] 22  (11/06 0605) BP: (108-123)/(60-75) 123/75 mmHg (11/06 0605) SpO2:  [93 %-96 %] 93 % (11/06 0605) Weight:  [107.004 kg (235 lb 14.4 oz)] 107.004 kg (235 lb 14.4 oz) (11/06 0605)  Intake/Output from previous day: 11/05 0701 - 11/06 0700 In: 1420 [P.O.:1420] Out: 2550 [Urine:2550]   Physical Exam: General: No chsnge, in no acute distress. Head:  Normocephalic and atraumatic. Lungs: Clear to auscultation and percussion. Heart: Normal S1 and S2.  No murmur, rubs or gallops.  Abdomen: soft, non-tender, positive bowel sounds. Obese Extremities: No clubbing or cyanosis. Decreased edema. Neurologic: Alert and oriented x 3.    Lab Results:  Basename 08/02/12 0628 08/01/12 0600  WBC 5.4 4.8  HGB 12.3 11.9*  PLT 101* 93*    Basename 08/02/12 0628 08/01/12 0600  NA 139 137  K 3.4* 3.3*  CL 95* 96  CO2 34* 34*  GLUCOSE 103* 141*  BUN 22 20  CREATININE 1.28* 1.25*   Telemetry: BBB, SVT yesterday Personally viewed.     Cardiac Studies:  Nl EF  Assessment/Plan:  Principal Problem:  *CHF (congestive heart failure) Active Problems:  HYPERLIPIDEMIA  MAJOR DEPRESSIVE DISORDER RECURRENT EPISODE MILD  SLEEP APNEA, OBSTRUCTIVE, MILD  HYPERTENSION, CONTROLLED  PAROXYSMAL ATRIAL FIBRILLATION  Muscle weakness (generalized)  AORTIC VALVE REPLACEMENT, HX OF  Elevated troponin  Acute diastolic CHF (congestive heart failure)  Tongue ulcer  Hypokalemia  Acute diastolic HF - I will continue with current plan today. Creat is only slightly increased. Probably decrease to oral tomorrow. Replete K. Just started aldactone.   Kg 118 on admit, now 107kg. Good work!  Discussed importance of diet restriction.   SVT - cont. Amio. Coreg.     Norene Oliveri 08/02/2012, 8:57 AM

## 2012-08-02 NOTE — Progress Notes (Signed)
[facility] PROGRESS NOTE  [name] ZOX:[id number] DOB: [birth year] DOA: 07/28/2012 PCP: Stacy Showers, MD  Assessment/Plan: Acute on chronic diastolic heart failure  -04/12/2012 echocardiogram shows ejection fraction 60-65%   -Continue furosemide 80 mg IV every 8 hours - diuretic is adjusted by Cardiology  -Fluid restriction, daily weights  -neg 10,654 cc since admission  -aldactone added on 08/01/12  -Patient having sinus tachycardia up to 150s during physical therapy, resting heart rate upper 90s; may need to increase carvedilol if patient can tolerate Elevated troponin  -Likely demand ischemia/CHF  -Repeat troponins negative  [facility] Cardiology following  Facial/Tongue Pain  -??trigeminal neuralgia vs referred pain from tongue lesion  - rescheduled her ENT appt for 08/08/2012 at 3:10 PM -Exam did not reveal any facial edema or exophthalmos or airway compromise  -viscous lidocaine--pt refusing because of taste  -pt eating 100% of meals without difficulty or dysphagia  - CT maxillofacial 08/02/12  Hypertension  -Well controlled  -continue carvedilol  Diabetes mellitus type 2  -increased Lantus 24 units at bedtime  -NovoLog sliding scale  -Accu-Checks are much improved.  -Hemoglobin A1c 8.1 Deconditioning  -continue PT/OT  -skilled nursing facility placement  Chronic thrombocytopenia  -No evidence of active bleeding  -Continue to monitor  Family Communication: patient  Disposition Plan: SNF when medically stable      Procedures/Studies: Dg Chest 2 View  07/28/2012  *RADIOLOGY REPORT*  Clinical Data: Congestive heart failure.  CHEST - 2 VIEW  Comparison: April 11, 2012.  Findings: Moderate cardiomegaly is stable.  Sternotomy wires are noted.  Central pulmonary vascular congestion is unchanged.  No acute pulmonary disease is noted.  IMPRESSION: Cardiomegaly.  No acute cardiopulmonary abnormality seen.   Original Report Authenticated By: [name].,  M.D.           Subjective: c/o facial pain  Objective: Filed Vitals:   08/01/12 1511 08/01/12 1836 08/01/12 2100 08/02/12 0605  BP:  108/60 122/74 123/75  Pulse: 112 100 102 99  Temp:   97.5 F (36.4 C) 97.6 F (36.4 C)  TempSrc:   Oral Oral  Resp:   20 22  Height:      Weight:    107.004 kg (235 lb 14.4 oz)  SpO2:   96% 93%   Patient Vitals for the past 24 hrs:  BP Temp Temp src Pulse Resp SpO2 Weight  08/02/12 0605 123/75 mmHg 97.6 F (36.4 C) Oral 99  22  93 % 107.004 kg (235 lb 14.4 oz)  08/01/12 2100 122/74 mmHg 97.5 F (36.4 C) Oral 102  20  96 % -  08/01/12 1836 108/60 mmHg - - 100  - - -  08/01/12 1511 - - - 112  - - -  08/01/12 1500 118/73 mmHg 97.6 F (36.4 C) Oral 101  - - -     Intake/Output Summary (Last 24 hours) at 08/02/12 0906 Last data filed at 08/02/12 8119  Gross per 24 hour  Intake   1080 ml  Output   2550 ml  Net  -1470 ml   Weight change: -3.357 kg (-7 lb 6.4 oz) Exam:   axox3  CVS: rrr  RS: ctab   Abdomen ; soft, nt  Data Reviewed: Basic Metabolic Panel:  Lab 08/02/12 1478 08/01/12 0600 07/31/12 0550 07/30/12 0630 07/29/12 0201 07/28/12 2111  NA 139 137 140 138 142 --  K 3.4* 3.3* 3.4* 3.5 3.5 --  CL 95* 96 98 101 102 --  CO2 34* 34* 32 25  27 --  GLUCOSE 103* 141* 131* 98 118* --  BUN 22 20 20 19 21  --  CREATININE 1.28* 1.25* 1.23* 1.12* 1.21* --  CALCIUM 9.7 9.5 9.2 8.9 8.9 --  MG -- -- -- -- -- 2.1  PHOS -- -- -- -- -- 3.3   Liver Function Tests: No results found for this basename: AST:5,ALT:5,ALKPHOS:5,BILITOT:5,PROT:5,ALBUMIN:5 in the last 168 hours No results found for this basename: LIPASE:5,AMYLASE:5 in the last 168 hours No results found for this basename: AMMONIA:5 in the last 168 hours CBC:  Lab 08/02/12 0628 08/01/12 0600 07/30/12 0630 07/29/12 0201 07/28/12 2111 07/28/12 1536  WBC 5.4 4.8 4.7 5.0 5.1 --  NEUTROABS -- -- -- -- -- 3.5  HGB 12.3 11.9* 11.9* 11.8* 13.1 --  HCT 38.4 36.6 37.7 36.9 40.6 --  MCV  96.0 93.8 95.0 95.3 96.2 --  PLT 101* 93* 104* 103* 121* --   Cardiac Enzymes:  Lab 07/29/12 0820 07/29/12 0201 07/28/12 2112  CKTOTAL -- -- --  CKMB -- -- --  CKMBINDEX -- -- --  TROPONINI <0.30 <0.30 <0.30   BNP: No components found with this basename: POCBNP:5 CBG:  Lab 08/02/12 0601 08/01/12 2119 08/01/12 1626 08/01/12 1148 08/01/12 0643  GLUCAP 102* 157* 164* 145* 123*    Recent Results (from the past 240 hour(s))  MRSA PCR SCREENING     Status: Normal   Collection Time   07/29/12  2:45 AM      Component Value Range Status Comment   MRSA by PCR NEGATIVE  NEGATIVE Final      Scheduled Meds:    . amiodarone  100 mg Oral Daily  . aspirin EC  81 mg Oral Daily  . carvedilol  3.125 mg Oral BID WC  . donepezil  10 mg Oral QHS  . ferrous sulfate  325 mg Oral BID  . furosemide  80 mg Intravenous Q8H  . gabapentin  300 mg Oral Daily  . gabapentin  600 mg Oral QHS  . heparin  5,000 Units Subcutaneous Q8H  . insulin aspart  0-15 Units Subcutaneous TID WC  . insulin glargine  24 Units Subcutaneous QHS  . lidocaine  20 mL Mouth/Throat TID AC & HS  . metolazone  2.5 mg Oral Daily  . omega-3 acid ethyl esters  1 g Oral Daily  . PARoxetine  40 mg Oral BH-[id number]  . potassium chloride SA  40 mEq Oral TID  . simvastatin  10 mg Oral QHS  . sodium chloride  3 mL Intravenous Q12H  . spironolactone  12.5 mg Oral Daily  . triamcinolone   Mouth/Throat QID  . [DISCONTINUED] insulin glargine  20 Units Subcutaneous Daily   Continuous Infusions:    Stacy Busenbark, MD  [facility] Pager [phone]  If 7PM-7AM, please contact night-coverage [email or web address] Password [email or web address] 08/02/2012, 9:06 AM   LOS: 5 days

## 2012-08-02 NOTE — Progress Notes (Signed)
Physical Therapy Treatment Patient Details Name: [name] MRN: [id number] DOB: 10-Mar-1933 Today's Date: 08/02/2012 Time: 4098-1191 PT Time Calculation (min): 29 min  PT Assessment / Plan / Recommendation Comments on Treatment Session  Ms. Cremeens improved today with use of RW. Great participation. Still agreeable to 1 wk of ST-SNF prior to d/c home alone, agree with this plan.    Follow Up Recommendations  SNF     Does the patient have the potential to tolerate intense rehabilitation     Barriers to Discharge        Equipment Recommendations  Rolling walker with 5" wheels    Recommendations for Other Services    Frequency     Plan Discharge plan remains appropriate;Frequency remains appropriate    Precautions / Restrictions Precautions Precautions: Fall Restrictions Weight Bearing Restrictions: No   Pertinent Vitals/Pain HR resting 101, after about 150 ft of ambulation HR increased to 134 but with standing rest break pt's heart rate came back down to 101-102 and remained there the rest of the session    Mobility  Bed Mobility Bed Mobility: Supine to Sit;Sit to Supine;Rolling Right;Rolling Left Rolling Right: 7: Independent Rolling Left: 7: Independent Supine to Sit: 5: Supervision;HOB elevated (30 degress) Sit to Supine: 5: Supervision;HOB flat;6: Modified independent (Device/Increase time) Details for Bed Mobility Assistance: cues to sequence to ease transfer (pt tends to hold her breath so cued to breath through movement) Transfers Sit to Stand: 5: Supervision;From bed;From toilet;With upper extremity assist Stand to Sit: 5: Supervision;To bed;To toilet;With upper extremity assist Details for Transfer Assistance: definite need of upper extremities to assist with stand especially from lower toilet surface (pt using grab bar on the right and pulling on door frame on the left), cues for safety and needed HHA standing without RW in front of her   Ambulation/Gait Ambulation/Gait Assistance: 5: Supervision Ambulation Distance (Feet): 250 Feet Assistive device: Rolling walker Ambulation/Gait Assistance Details: cues for tall posture and safe use of RW, used RW this session and pt ambulating with smoother more efficient gait pattern, HR did rise to 134 after about 150 ft however with standing rest break it came back down to 101-102 (which was her resting hr prior to ambulation) Gait Pattern: Wide base of support;Decreased hip/knee flexion - left;Decreased hip/knee flexion - right;Decreased stride length;Step-through pattern     PT Goals Acute Rehab PT Goals PT Goal: Supine/Side to Sit - Progress: Progressing toward goal PT Goal: Sit to Supine/Side - Progress: Met PT Goal: Ambulate - Progress: Progressing toward goal  Visit Information  Last PT Received On: 08/02/12 Assistance Needed: +1    Subjective Data  Subjective: My heart rate tends to get up really high when I get up.    Cognition  Overall Cognitive Status: Appears within functional limits for tasks assessed/performed Arousal/Alertness: Awake/alert Orientation Level: Appears intact for tasks assessed Behavior During Session: [facility] for tasks performed    Balance  Static Standing Balance Static Standing - Balance Support: No upper extremity supported Static Standing - Level of Assistance: 5: Stand by assistance Static Standing - Comment/# of Minutes: pt stood at sink 3-4 minutes performing ADLs demonstrating increased standing tolerance and dynamic balance with reaching and small trunk rotational movements  End of Session PT - End of Session Equipment Utilized During Treatment: Gait belt Activity Tolerance: Patient tolerated treatment well Patient left: in bed;with call bell/phone within reach;Other (comment) (transport team in the room)   GP     [facility] HELEN 08/02/2012, 4:04 PM

## 2012-08-03 LAB — BASIC METABOLIC PANEL
BUN: 23 mg/dL (ref 6–23)
CO2: 35 mEq/L — ABNORMAL HIGH (ref 19–32)
Calcium: 9.9 mg/dL (ref 8.4–10.5)
Creatinine, Ser: 1.41 mg/dL — ABNORMAL HIGH (ref 0.50–1.10)
Glucose, Bld: 131 mg/dL — ABNORMAL HIGH (ref 70–99)
Sodium: 138 mEq/L (ref 135–145)

## 2012-08-03 LAB — GLUCOSE, CAPILLARY
Glucose-Capillary: 203 mg/dL — ABNORMAL HIGH (ref 70–99)
Glucose-Capillary: 120 mg/dL — ABNORMAL HIGH (ref 70–99)
Glucose-Capillary: 223 mg/dL — ABNORMAL HIGH (ref 70–99)

## 2012-08-03 MED ORDER — SPIRONOLACTONE 12.5 MG HALF TABLET: 12.5000 mg | ORAL_TABLET | Freq: Every day | ORAL | Status: DC

## 2012-08-03 MED ORDER — CARVEDILOL 12.5 MG PO TABS
12.5000 mg | ORAL_TABLET | Freq: Two times a day (BID) | ORAL | Status: DC
  Administered 2012-08-03 – 2012-08-04 (×3): 12.5 mg via ORAL
  Filled 2012-08-03 (×4): qty 1

## 2012-08-03 MED ORDER — METOLAZONE 2.5 MG PO TABS: 2.5000 mg | ORAL_TABLET | ORAL | Status: DC

## 2012-08-03 MED ORDER — CARVEDILOL 6.25 MG PO TABS: 6.2500 mg | ORAL_TABLET | Freq: Two times a day (BID) | ORAL | Status: DC

## 2012-08-03 MED ORDER — INSULIN GLARGINE 100 UNIT/ML ~~LOC~~ SOLN: 24.0000 [IU] | Freq: Every day | SUBCUTANEOUS | Status: DC

## 2012-08-03 MED ORDER — FUROSEMIDE 40 MG PO TABS
40.0000 mg | ORAL_TABLET | Freq: Two times a day (BID) | ORAL | Status: DC
  Administered 2012-08-03 – 2012-08-04 (×3): 40 mg via ORAL
  Filled 2012-08-03 (×4): qty 1

## 2012-08-03 MED ORDER — CARVEDILOL 6.25 MG PO TABS
6.2500 mg | ORAL_TABLET | Freq: Two times a day (BID) | ORAL | Status: DC
  Filled 2012-08-03 (×2): qty 1

## 2012-08-03 MED ORDER — AMIODARONE HCL 200 MG PO TABS
200.0000 mg | ORAL_TABLET | Freq: Every day | ORAL | Status: DC
  Administered 2012-08-04: 200 mg via ORAL
  Filled 2012-08-03: qty 1

## 2012-08-03 NOTE — Discharge Summary (Signed)
Physician Discharge Summary  Alli Jasmer [facility] WUJ:[id number] DOB: Dec 13, 1932 DOA: 07/28/2012  PCP: [name], MD  Admit date: 07/28/2012 Discharge date: 08/03/2012  Time spent: 40 minutes  Recommendations for Outpatient Follow-up:  1. Daily weight checks to assure she does not retain fluids 2. Creatinine check at next cardiology visit 3. Tongue lesion biopsy - 11/12 as scheduled with ENT  Discharge Diagnoses:  Acute on chronic diastolic CHF (congestive heart failure)  HYPERLIPIDEMIA  MAJOR DEPRESSIVE DISORDER RECURRENT EPISODE MILD  SLEEP APNEA, OBSTRUCTIVE, MILD  HYPERTENSION, CONTROLLED  PAROXYSMAL ATRIAL FIBRILLATION  Muscle weakness (generalized)  AORTIC VALVE REPLACEMENT, HX OF  Tongue ulcer  Hypokalemia   Discharge Condition: good   Diet recommendation: heart healthy   Filed Weights   08/01/12 0437 08/02/12 0605 08/03/12 0634  Weight: 110.36 kg (243 lb 4.8 oz) 107.004 kg (235 lb 14.4 oz) 102.83 kg (226 lb 11.2 oz)    History of present illness:  With history of diastolic and systolic CHF stage IV NYHA at baseline (although most recent echocardiogram in July 2013 mentions normal EF, prior echocardiograms have shown decreased EF @ 45 percent). Presents to the ED with recent complaints of increase in swelling and weight gain of 10 lbs in the last week or so. Patient feels like she has been more short of breath with activity although she reports that she is unable to do much activity at baseline without getting short of breath. Denies any fever, chills, chest pain, or hemoptysis. In ED patient was found to have an elevated BNP 5777 and was given 160 mg IV lasix. Patient was not complaining of any shortness of breath. Denies any difficulty with her vision.      Hospital Course:  1. Acute on chronic diastolic heart failure  -04/12/2012 echocardiogram showed ejection fraction 60-65%  Patient received diuresis with  furosemide 80 mg IV every 8 hours - diuretic is adjusted by  Cardiology  -Fluid restriction, daily weights  -neg 13,208 cc during admission  -aldactone added on 08/01/12  - she was then converted back to her home dose diuretic and weekly metolazone.  2. Elevated troponin  -Likely demand ischemia/CHF  -Repeat troponins negative  - Dr. Anne Fu followed the patient and did not recommend any new interventions.  3. Facial/Tongue Pain  -trigeminal neuralgia vs referred pain from tongue lesion  - rescheduled her ENT appt for 08/08/2012 at 3:10 PM  -Exam did not reveal any facial edema or exophthalmos or airway compromise  -viscous lidocaine--pt refusing because of taste  -pt eating 100% of meals without difficulty or dysphagia  - CT maxillofacial 08/02/12 without any masse or maxillary sinusitis  3. Hypertension  -Well controlled  Patient was placed on  carvedilol during admission  4. Diabetes mellitus type 2  -increased Lantus 24 units at bedtime  -NovoLog sliding scale  -Accu-Checks are much improved.  -Hemoglobin A1c 8.1  5. Deconditioning  -continue PT/OT  -skilled nursing facility placement  6. Chronic thrombocytopenia  -No evidence of active bleeding  -Continue to monitor    Procedures: None  Consultations:  [facility]   Discharge Exam: Filed Vitals:   08/02/12 1540 08/02/12 1900 08/02/12 2039 08/03/12 0634  BP:   113/45 132/78  Pulse: 134  99 103  Temp:   98.5 F (36.9 C) 98.4 F (36.9 C)  TempSrc:  Rectal Oral Oral  Resp:   20 20  Height:      Weight:    102.83 kg (226 lb 11.2 oz)  SpO2:   97%  92%    General: axox3 Cardiovascular: RRR Respiratory: CTAB  Discharge Instructions      Discharge Orders    Future Appointments: Provider: Department: Dept Phone: Center:   09/25/2012 11:15 AM Rachael Fee [facility] [phone] None   09/25/2012 11:45 AM Josph Macho, MD [facility] [phone] None   09/25/2012 12:00 PM [facility] [place]  [facility] [phone] None     Future Orders Please Complete By Expires   Diet - low sodium heart healthy      Increase activity slowly          Medication List     As of 08/03/2012 10:57 AM    STOP taking these medications         metoprolol tartrate 25 MG tablet   Commonly known as: LOPRESSOR      TAKE these medications         acetaminophen 325 MG tablet   Commonly known as: TYLENOL   Take 162.5 mg by mouth every 6 (six) hours as needed. For pain      ACIDOPHILUS PROBIOTIC 100 MG Caps   Take 1 capsule by mouth daily with breakfast.      amiodarone 200 MG tablet   Commonly known as: PACERONE   Take 100 mg by mouth daily.      aspirin EC 81 MG tablet   Take 81 mg by mouth daily.      carvedilol 6.25 MG tablet   Commonly known as: COREG   Take 1 tablet (6.25 mg total) by mouth 2 (two) times daily with a meal.      CIPRODEX otic suspension   Generic drug: ciprofloxacin-dexamethasone   Place 4 drops into both ears 2 (two) times daily as needed. For otitis media      diphenhydrAMINE-zinc acetate cream   Commonly known as: BENADRYL   Apply 1 application topically 2 (two) times daily as needed. For itching      docusate sodium 100 MG capsule   Commonly known as: COLACE   Take 100 mg by mouth 3 (three) times daily as needed. For constipation      donepezil 10 MG tablet   Commonly known as: ARICEPT   Take 10 mg by mouth at bedtime.      ferrous sulfate 325 (65 FE) MG tablet   Take 325 mg by mouth 2 (two) times daily.      fish oil-omega-3 fatty acids 1000 MG capsule   Take 1 g by mouth daily.      furosemide 40 MG tablet   Commonly known as: LASIX   Take 40 mg by mouth 2 (two) times daily. 4 tabs in the am, 2 tabs at bedtime      gabapentin 600 MG tablet   Commonly known as: NEURONTIN   Take 300-600 mg by mouth 2 (two) times daily. Take 1/2 tab in the morning and 1 tab at bedtime      insulin glargine 100 UNIT/ML injection   Commonly  known as: LANTUS   Inject 24 Units into the skin daily.      metolazone 2.5 MG tablet   Commonly known as: ZAROXOLYN   Take 1 tablet (2.5 mg total) by mouth once a week.      multivitamin tablet   Take 1 tablet by mouth daily.      PARoxetine 40 MG tablet   Commonly known as:  PAXIL   Take 40 mg by mouth every morning.      polyethylene glycol packet   Commonly known as: MIRALAX / GLYCOLAX   Take 17 g by mouth daily as needed. For constipation      potassium chloride SA 20 MEQ tablet   Commonly known as: K-DUR,KLOR-CON   Take 20 mEq by mouth 3 (three) times daily.      simvastatin 10 MG tablet   Commonly known as: ZOCOR   Take 10 mg by mouth at bedtime.      spironolactone 12.5 mg Tabs   Commonly known as: ALDACTONE   Take 0.5 tablets (12.5 mg total) by mouth daily.      triamcinolone 0.1 % paste   Commonly known as: KENALOG   Place 1 application onto teeth 4 (four) times daily.        Follow-up Information    Schedule an appointment as soon as possible for a visit with Donato Schultz, MD.   Contact information:   [address] [place] [address] [phone]       Follow up with [facility] . On 08/08/2012. (Dr. Pollyann Kennedy at 3:10 PM)    Contact information:   [phone]           The results of significant diagnostics from this hospitalization (including imaging, microbiology, ancillary and laboratory) are listed below for reference.    Significant Diagnostic Studies: Dg Chest 2 View  07/28/2012  *RADIOLOGY REPORT*  Clinical Data: Congestive heart failure.  CHEST - 2 VIEW  Comparison: April 11, 2012.  Findings: Moderate cardiomegaly is stable.  Sternotomy wires are noted.  Central pulmonary vascular congestion is unchanged.  No acute pulmonary disease is noted.  IMPRESSION: Cardiomegaly.  No acute cardiopulmonary abnormality seen.   Original Report Authenticated By: Lupita Raider.,  M.D.    Ct Maxillofacial W/cm  08/02/2012  *RADIOLOGY REPORT*   Clinical Data: Facial pain and edema, bilateral year pain, ulceration to the bottom of the left side of the tongue.  CT MAXILLOFACIAL WITH CONTRAST  Technique:  Multidetector CT imaging of the maxillofacial structures was performed with intravenous contrast. Multiplanar CT image reconstructions were also generated.  A metallic BB was placed on the right frontotemporal region in order to reliably differentiate right from left.  Contrast: 75mL OMNIPAQUE IOHEXOL 300 MG/ML.  Comparison: Unenhanced maxillofacial CT 04/06/2011.  Findings: Slight head tilt in the gantry. No abnormal enhancement involving the soft tissues of the face.  Normal and symmetric bilateral parotid and submandibular salivary glands.  Normal tonsillar tissue.  Normal larynx.  No mass involving the tongue. No lymphadenopathy within the visualized face.  Moderate atherosclerosis at the origin of both internal carotid arteries without visible stenosis.  Orbits and globes intact.  Paranasal sinuses, mastoid air cells, and middle ear cavities well- aerated.  No focal abnormalities involving any of the facial bones. Temporomandibular joints intact, with degenerative changes involving the right TMJ.  Slight bony nasal septal deviation to the left.  IMPRESSION:  1.  No acute abnormalities involving the maxillofacial structures. Specifically, no evidence of abscess, mass, or lymphadenopathy. 2.  Atherosclerosis at the origin of both internal carotid arteries without evidence of stenosis. 3.  Degenerative changes involving the right temporomandibular joint.   Original Report Authenticated By: Hulan Saas, M.D.     Microbiology: Recent Results (from the past 240 hour(s))  MRSA PCR SCREENING     Status: Normal   Collection Time   07/29/12  2:45 AM  Component Value Range Status Comment   MRSA by PCR NEGATIVE  NEGATIVE Final      Labs: Basic Metabolic Panel:  Lab 08/03/12 4098 08/02/12 0628 08/01/12 0600 07/31/12 0550 07/30/12 0630 07/28/12  2111  NA 138 139 137 140 138 --  K 3.6 3.4* 3.3* 3.4* 3.5 --  CL 92* 95* 96 98 101 --  CO2 35* 34* 34* 32 25 --  GLUCOSE 131* 103* 141* 131* 98 --  BUN 23 22 20 20 19  --  CREATININE 1.41* 1.28* 1.25* 1.23* 1.12* --  CALCIUM 9.9 9.7 9.5 9.2 8.9 --  MG -- -- -- -- -- 2.1  PHOS -- -- -- -- -- 3.3   Liver Function Tests: No results found for this basename: AST:5,ALT:5,ALKPHOS:5,BILITOT:5,PROT:5,ALBUMIN:5 in the last 168 hours No results found for this basename: LIPASE:5,AMYLASE:5 in the last 168 hours No results found for this basename: AMMONIA:5 in the last 168 hours CBC:  Lab 08/02/12 0628 08/01/12 0600 07/30/12 0630 07/29/12 0201 07/28/12 2111 07/28/12 1536  WBC 5.4 4.8 4.7 5.0 5.1 --  NEUTROABS -- -- -- -- -- 3.5  HGB 12.3 11.9* 11.9* 11.8* 13.1 --  HCT 38.4 36.6 37.7 36.9 40.6 --  MCV 96.0 93.8 95.0 95.3 96.2 --  PLT 101* 93* 104* 103* 121* --   Cardiac Enzymes:  Lab 07/29/12 0820 07/29/12 0201 07/28/12 2112  CKTOTAL -- -- --  CKMB -- -- --  CKMBINDEX -- -- --  TROPONINI <0.30 <0.30 <0.30   BNP: BNP (last 3 results)  Basename 07/28/12 1536 04/11/12 2300 11/22/11 1116  PROBNP 5777.0* 17127.0* 7579.0*   CBG:  Lab 08/03/12 0623 08/02/12 2033 08/02/12 1640 08/02/12 1129 08/02/12 0601  GLUCAP 120* 203* 126* 182* 102*       Signed:  Treyana Sturgell  [facility] 08/03/2012, 10:57 AM

## 2012-08-03 NOTE — Progress Notes (Signed)
Pt heart rate elevated in the 180s-200s sustaining for approximately 5 mins.  Upon arriving to room pt resting in chair complaining of shoulder pain rated a 2/10.  Pt denies any chest pain, SOB, lightheadness, or dizziness.  Pt reports recently ambulating to bathroom and brushing teeth at the sink.  Pt heart rate in the 120s-130s at the time.  Pt returned to bed.  Upon standing pt complained of feeling dizzy.  Pt placed in bed.  Vitals taken.  BP 139/82, pulse 100, 92% on RA.  Pt placed on 2L/02 via [place] for comfort.  MD has been made aware.  New orders given.  Will continue to monitor. Nino Glow RN

## 2012-08-03 NOTE — Progress Notes (Addendum)
Subjective:  Sitting up in bed, comfortable, no shortness of breath. Neck CT done yesterday  Objective:  Vital Signs in the last 24 hours: Temp:  [98.4 F (36.9 C)-98.7 F (37.1 C)] 98.4 F (36.9 C) (11/07 0634) Pulse Rate:  [99-134] 103  (11/07 0634) Resp:  [20-22] 20  (11/07 0634) BP: (113-132)/(45-78) 132/78 mmHg (11/07 0634) SpO2:  [92 %-98 %] 92 % (11/07 0634) Weight:  [102.83 kg (226 lb 11.2 oz)] 102.83 kg (226 lb 11.2 oz) (11/07 0634)  Intake/Output from previous day: 11/06 0701 - 11/07 0700 In: 856 [P.O.:840; IV Piggyback:16] Out: 5250 [Urine:5250]   Physical Exam: General: Well developed, well nourished, in no acute distress. Deconditioned  Head:  Normocephalic and atraumatic. Alopecia Lungs: Clear to auscultation and percussion. Heart: Normal S1 and S2.  No murmur, rubs or gallops.  Abdomen: soft, non-tender, positive bowel sounds. Obese Extremities: No clubbing or cyanosis. 1+ edema. Chronic pigmentation changes Neurologic: Alert and oriented x 3.    Lab Results:  Basename 08/02/12 0628 08/01/12 0600  WBC 5.4 4.8  HGB 12.3 11.9*  PLT 101* 93*    Basename 08/03/12 0530 08/02/12 0628  NA 138 139  K 3.6 3.4*  CL 92* 95*  CO2 35* 34*  GLUCOSE 131* 103*  BUN 23 22  CREATININE 1.41* 1.28*    Imaging: Ct Maxillofacial W/cm  08/02/2012  *RADIOLOGY REPORT*  Clinical Data: Facial pain and edema, bilateral year pain, ulceration to the bottom of the left side of the tongue.  CT MAXILLOFACIAL WITH CONTRAST  Technique:  Multidetector CT imaging of the maxillofacial structures was performed with intravenous contrast. Multiplanar CT image reconstructions were also generated.  A metallic BB was placed on the right frontotemporal region in order to reliably differentiate right from left.  Contrast: 75mL OMNIPAQUE IOHEXOL 300 MG/ML.  Comparison: Unenhanced maxillofacial CT 04/06/2011.  Findings: Slight head tilt in the gantry. No abnormal enhancement involving the soft  tissues of the face.  Normal and symmetric bilateral parotid and submandibular salivary glands.  Normal tonsillar tissue.  Normal larynx.  No mass involving the tongue. No lymphadenopathy within the visualized face.  Moderate atherosclerosis at the origin of both internal carotid arteries without visible stenosis.  Orbits and globes intact.  Paranasal sinuses, mastoid air cells, and middle ear cavities well- aerated.  No focal abnormalities involving any of the facial bones. Temporomandibular joints intact, with degenerative changes involving the right TMJ.  Slight bony nasal septal deviation to the left.  IMPRESSION:  1.  No acute abnormalities involving the maxillofacial structures. Specifically, no evidence of abscess, mass, or lymphadenopathy. 2.  Atherosclerosis at the origin of both internal carotid arteries without evidence of stenosis. 3.  Degenerative changes involving the right temporomandibular joint.   Original Report Authenticated By: Hulan Saas, M.D.    Personally viewed.   Telemetry: Recent SVT episodes. Personally viewed.     Cardiac Studies:  Normal EF  Assessment/Plan:  Principal Problem:  *CHF (congestive heart failure) Active Problems:  HYPERLIPIDEMIA  MAJOR DEPRESSIVE DISORDER RECURRENT EPISODE MILD  SLEEP APNEA, OBSTRUCTIVE, MILD  HYPERTENSION, CONTROLLED  PAROXYSMAL ATRIAL FIBRILLATION  Muscle weakness (generalized)  AORTIC VALVE REPLACEMENT, HX OF  Elevated troponin  Acute diastolic CHF (congestive heart failure)  Tongue ulcer  Hypokalemia  -Given her increase in creatinine today once again, I will discontinue her IV Lasix and transfer her back to her home dose Lasix of 160 a.m./80 p.m. by mouth. I will continue with low-dose Aldactone 12.5 g twice a day. I  will also request metolazone 2.5 mg once weekly on Monday. Since she is still hypokalemic, I will continue with potassium supplementation at 40 mEq 3 times a day. Tomorrow, if repleted, we will transition to  20 mEq 3 times a day as she was previously at home. She states that she will be going to [facility].  -Given her repeat SVT/atrial flutter possible, I will increase her Coreg to 6.25 twice a day.   Appreciate excellent care by hospitalist team.  Hopefully she will be ready for discharge tomorrow.  SKAINS, MARK 08/03/2012, 8:56 AM

## 2012-08-03 NOTE — Progress Notes (Signed)
[facility] PROGRESS NOTE  [name] [facility] WGN:[id number] DOB: [birth year] DOA: 07/28/2012 PCP: [name], MD  Assessment/Plan:  Severe tachycardia when ambulating - on 08/03/12 patient HR sustained into 200s after getting up to go to bathroom - amiodarone increased to 200 mg daily and coreg increased to 12.5 mg bid on 08/03/12    Acute on chronic diastolic heart failure  -04/12/2012 echocardiogram shows ejection fraction 60-65%   -Continue furosemide 80 mg IV every 8 hours - diuretic is adjusted by Cardiology  -Fluid restriction, daily weights  -neg 10,654 cc since admission  -aldactone added on 08/01/12   Elevated troponin  -Likely demand ischemia/CHF  -Repeat troponins negative  [facility] following  Facial/Tongue Pain  -??trigeminal neuralgia vs referred pain from tongue lesion  - ENT appt with Dr. Pollyann Kennedy  08/08/2012 at 3:10 PM -Exam did not reveal any facial edema or exophthalmos or airway compromise  -viscous lidocaine--pt refusing because of taste  -pt eating 100% of meals without difficulty or dysphagia  - CT maxillofacial 08/02/12 wnl Hypertension  -Well controlled  -continue carvedilol  Diabetes mellitus type 2  -increased Lantus 24 units at bedtime  -NovoLog sliding scale  -Accu-Checks are much improved.  -Hemoglobin A1c 8.1 Deconditioning  -continue PT/OT  -skilled nursing facility placement  Chronic thrombocytopenia  -No evidence of active bleeding  -Continue to monitor  Family Communication: patient  Disposition Plan: SNF when medically stable      Procedures/Studies: Dg Chest 2 View  07/28/2012  *RADIOLOGY REPORT*  Clinical Data: Congestive heart failure.  CHEST - 2 VIEW  Comparison: April 11, 2012.  Findings: Moderate cardiomegaly is stable.  Sternotomy wires are noted.  Central pulmonary vascular congestion is unchanged.  No acute pulmonary disease is noted.  IMPRESSION: Cardiomegaly.  No acute cardiopulmonary abnormality seen.   Original  Report Authenticated By: Lupita Raider.,  M.D.          Subjective: Less dyspnea  Objective: Filed Vitals:   08/03/12 0634 08/03/12 1218 08/03/12 1355 08/03/12 1413  BP: 132/78 110/60 139/82 122/72  Pulse: 103 99 100 99  Temp: 98.4 F (36.9 C)   98.2 F (36.8 C)  TempSrc: Oral   Oral  Resp: 20   20  Height:      Weight: 102.83 kg (226 lb 11.2 oz)     SpO2: 92%  92% 100%   Patient Vitals for the past 24 hrs:  BP Temp Temp src Pulse Resp SpO2 Weight  08/03/12 1413 122/72 mmHg 98.2 F (36.8 C) Oral 99  20  100 % -  08/03/12 1355 139/82 mmHg - - 100  - 92 % -  08/03/12 1218 110/60 mmHg - - 99  - - -  08/03/12 0634 132/78 mmHg 98.4 F (36.9 C) Oral 103  20  92 % 102.83 kg (226 lb 11.2 oz)  08/02/12 2039 113/45 mmHg 98.5 F (36.9 C) Oral 99  20  97 % -  08/02/12 1900 - - Rectal - - - -  08/02/12 1540 - - - 134  - - -     Intake/Output Summary (Last 24 hours) at 08/03/12 1511 Last data filed at 08/03/12 1338  Gross per 24 hour  Intake    736 ml  Output   4150 ml  Net  -3414 ml   Weight change: -4.173 kg (-9 lb 3.2 oz) Exam:   axox3  CVS: rrr  RS: ctab   Abdomen ; soft, nt  Data Reviewed: Basic Metabolic Panel:  Lab  08/03/12 0530 08/02/12 0628 08/01/12 0600 07/31/12 0550 07/30/12 0630 07/28/12 2111  NA 138 139 137 140 138 --  K 3.6 3.4* 3.3* 3.4* 3.5 --  CL 92* 95* 96 98 101 --  CO2 35* 34* 34* 32 25 --  GLUCOSE 131* 103* 141* 131* 98 --  BUN 23 22 20 20 19  --  CREATININE 1.41* 1.28* 1.25* 1.23* 1.12* --  CALCIUM 9.9 9.7 9.5 9.2 8.9 --  MG -- -- -- -- -- 2.1  PHOS -- -- -- -- -- 3.3   Liver Function Tests: No results found for this basename: AST:5,ALT:5,ALKPHOS:5,BILITOT:5,PROT:5,ALBUMIN:5 in the last 168 hours No results found for this basename: LIPASE:5,AMYLASE:5 in the last 168 hours No results found for this basename: AMMONIA:5 in the last 168 hours CBC:  Lab 08/02/12 0628 08/01/12 0600 07/30/12 0630 07/29/12 0201 07/28/12 2111 07/28/12 1536   WBC 5.4 4.8 4.7 5.0 5.1 --  NEUTROABS -- -- -- -- -- 3.5  HGB 12.3 11.9* 11.9* 11.8* 13.1 --  HCT 38.4 36.6 37.7 36.9 40.6 --  MCV 96.0 93.8 95.0 95.3 96.2 --  PLT 101* 93* 104* 103* 121* --   Cardiac Enzymes:  Lab 07/29/12 0820 07/29/12 0201 07/28/12 2112  CKTOTAL -- -- --  CKMB -- -- --  CKMBINDEX -- -- --  TROPONINI <0.30 <0.30 <0.30   BNP: No components found with this basename: POCBNP:5 CBG:  Lab 08/03/12 1148 08/03/12 0623 08/02/12 2033 08/02/12 1640 08/02/12 1129  GLUCAP 179* 120* 203* 126* 182*    Recent Results (from the past 240 hour(s))  MRSA PCR SCREENING     Status: Normal   Collection Time   07/29/12  2:45 AM      Component Value Range Status Comment   MRSA by PCR NEGATIVE  NEGATIVE Final      Scheduled Meds:    . amiodarone  200 mg Oral Daily  . aspirin EC  81 mg Oral Daily  . carvedilol  12.5 mg Oral BID WC  . donepezil  10 mg Oral QHS  . ferrous sulfate  325 mg Oral BID  . furosemide  40 mg Oral BID  . gabapentin  300 mg Oral Daily  . gabapentin  600 mg Oral QHS  . heparin  5,000 Units Subcutaneous Q8H  . insulin aspart  0-15 Units Subcutaneous TID WC  . insulin glargine  24 Units Subcutaneous QHS  . lidocaine  20 mL Mouth/Throat TID AC & HS  . metolazone  2.5 mg Oral Weekly  . omega-3 acid ethyl esters  1 g Oral Daily  . PARoxetine  40 mg Oral [id number]  . potassium chloride SA  40 mEq Oral TID  . simvastatin  10 mg Oral QHS  . sodium chloride  3 mL Intravenous Q12H  . spironolactone  12.5 mg Oral Daily  . triamcinolone   Mouth/Throat QID  . [DISCONTINUED] amiodarone  100 mg Oral Daily  . [DISCONTINUED] carvedilol  3.125 mg Oral BID WC  . [DISCONTINUED] carvedilol  6.25 mg Oral BID WC  . [DISCONTINUED] furosemide  80 mg Intravenous Q8H  . [DISCONTINUED] metolazone  2.5 mg Oral Daily   Continuous Infusions:    Stacy Morici, MD  [facility] Pager [phone]  If 7PM-7AM, please contact night-coverage [email or web address] Password  [email or web address] 08/03/2012, 3:11 PM   LOS: 6 days

## 2012-08-04 DIAGNOSIS — L659 Nonscarring hair loss, unspecified: Secondary | ICD-10-CM

## 2012-08-04 DIAGNOSIS — F0280 Dementia in other diseases classified elsewhere without behavioral disturbance: Secondary | ICD-10-CM

## 2012-08-04 LAB — BASIC METABOLIC PANEL
CO2: 35 mEq/L — ABNORMAL HIGH (ref 19–32)
Calcium: 9.7 mg/dL (ref 8.4–10.5)
Creatinine, Ser: 1.38 mg/dL — ABNORMAL HIGH (ref 0.50–1.10)

## 2012-08-04 MED ORDER — DOCUSATE SODIUM 100 MG PO CAPS: 100.0000 mg | ORAL_CAPSULE | Freq: Three times a day (TID) | ORAL | Status: DC | PRN

## 2012-08-04 MED ORDER — SPIRONOLACTONE 12.5 MG HALF TABLET: 12.5000 mg | ORAL_TABLET | Freq: Two times a day (BID) | ORAL | Status: DC

## 2012-08-04 MED ORDER — DONEPEZIL HCL 10 MG PO TABS: 10.0000 mg | ORAL_TABLET | Freq: Every day | ORAL | Status: DC

## 2012-08-04 MED ORDER — PAROXETINE HCL 40 MG PO TABS: 40.0000 mg | ORAL_TABLET | ORAL | Status: DC

## 2012-08-04 MED ORDER — OMEGA-3 FATTY ACIDS 1000 MG PO CAPS: 1.0000 g | ORAL_CAPSULE | Freq: Every day | ORAL | Status: DC

## 2012-08-04 MED ORDER — FERROUS SULFATE 325 (65 FE) MG PO TABS: 325.0000 mg | ORAL_TABLET | Freq: Two times a day (BID) | ORAL | Status: DC

## 2012-08-04 MED ORDER — POTASSIUM CHLORIDE CRYS ER 20 MEQ PO TBCR: 20.0000 meq | EXTENDED_RELEASE_TABLET | Freq: Three times a day (TID) | ORAL | Status: DC

## 2012-08-04 MED ORDER — METOLAZONE 2.5 MG PO TABS: 2.5000 mg | ORAL_TABLET | ORAL | Status: DC

## 2012-08-04 MED ORDER — FUROSEMIDE 40 MG PO TABS: 40.0000 mg | ORAL_TABLET | Freq: Two times a day (BID) | ORAL | Status: DC

## 2012-08-04 MED ORDER — CIPROFLOXACIN-DEXAMETHASONE 0.3-0.1 % OT SUSP: 4.0000 [drp] | Freq: Two times a day (BID) | OTIC | Status: DC | PRN

## 2012-08-04 MED ORDER — GABAPENTIN 600 MG PO TABS: 300.0000 mg | ORAL_TABLET | Freq: Two times a day (BID) | ORAL | Status: DC

## 2012-08-04 MED ORDER — CARVEDILOL 12.5 MG PO TABS: 12.5000 mg | ORAL_TABLET | Freq: Two times a day (BID) | ORAL | Status: DC

## 2012-08-04 MED ORDER — DIPHENHYDRAMINE-ZINC ACETATE 2-0.1 % EX CREA: 1.0000 "application " | TOPICAL_CREAM | Freq: Two times a day (BID) | CUTANEOUS | Status: AC | PRN

## 2012-08-04 MED ORDER — ACETAMINOPHEN 325 MG PO TABS: 162.5000 mg | ORAL_TABLET | Freq: Four times a day (QID) | ORAL | Status: DC | PRN

## 2012-08-04 MED ORDER — ASPIRIN EC 81 MG PO TBEC: 81.0000 mg | DELAYED_RELEASE_TABLET | Freq: Every day | ORAL | Status: DC

## 2012-08-04 MED ORDER — AMIODARONE HCL 200 MG PO TABS: 200.0000 mg | ORAL_TABLET | Freq: Every day | ORAL | Status: DC

## 2012-08-04 MED ORDER — INSULIN GLARGINE 100 UNIT/ML ~~LOC~~ SOLN: 24.0000 [IU] | Freq: Every day | SUBCUTANEOUS | Status: DC

## 2012-08-04 MED ORDER — SIMVASTATIN 10 MG PO TABS: 10.0000 mg | ORAL_TABLET | Freq: Every day | ORAL | Status: DC

## 2012-08-04 NOTE — Progress Notes (Signed)
Clinical social worker assisted with patient discharge to skilled nursing facility, [facility].  CSW addressed all family questions and concerns. CSW copied chart and added all important documents. Patient will be traveling by private vehicle to SNF. Clinical Social Worker will sign off for now as social work intervention is no longer needed.   Sabino Niemann, MSW, [employer] [phone]

## 2012-08-04 NOTE — Care Management Note (Signed)
    Page 1 of 1   08/04/2012     10:45:38 AM   CARE MANAGEMENT NOTE 08/04/2012  Patient:  [name], [name]   Account Number:  [id number]  Date Initiated:  08/04/2012  Documentation initiated by:  Tera Mater  Subjective/Objective Assessment:   78yo female admitted with CHF.  Pt. lives at [facility] and will return there.     Action/Plan:   Discharge planning to return to [facility]   Anticipated DC Date:  08/04/2012   Anticipated DC Plan:  SKILLED NURSING FACILITY  In-house referral  Clinical Social Worker      DC Planning Services  CM consult      Choice offered to / List presented to:             Status of service:  Completed, signed off Medicare Important Message given?   (If response is "NO", the following Medicare IM given date fields will be blank) Date Medicare IM given:   Date Additional Medicare IM given:    Discharge Disposition:  SKILLED NURSING FACILITY  Per UR Regulation:  Reviewed for med. necessity/level of care/duration of stay  If discussed at Long Length of Stay Meetings, dates discussed:   08/03/2012    Comments:  08/04/12 1044 Will dc back to SNF [facility] today.  Tera Mater, RN, BSN NCM [phone]

## 2012-08-04 NOTE — Progress Notes (Signed)
SATURATION QUALIFICATIONS:  Patient Saturations on Room Air at Rest = 91%  Patient Saturations on Room Air while Ambulating = 87%  Patient Saturations on 2 Liters of oxygen while Ambulating = 94%  Statement of medical necessity for home oxygen:

## 2012-08-04 NOTE — Discharge Summary (Signed)
Physician Discharge Summary  [name] [facility] MVH:[id number] DOB: 27-Jul-1933 DOA: 07/28/2012  PCP: [name], MD  Admit date: 07/28/2012 Discharge date: 08/04/2012  Recommendations for Outpatient Follow-up:  1. Pt will need to follow up with PCP in 2-3 weeks post discharge 2. Please obtain BMP to evaluate electrolytes and kidney function, baseline creatinine is 1.2 - 1.4 3. Please also check CBC to evaluate Hg and Hct levels 4. Pt will be discharged to SNF 5. Please note that weigh on admission was 264 lbs and on discharge date 224 lbs 6. Please note that pt has been desaturating to mid and high 80% with ambulation and will require oxygen specifically with ambulation  7. Following changes were made to pt's medical regimen: Amiodarone was increased to 200 gm QD, Coreg increased to 12.5 mg BID, Aldactone 12. 5 mg BID 8. Pt will continue to take Lasix and K-dur as her previous medical regimen at home  Discharge Diagnoses: Acute respiratory failure secondary to combined acute on chronic systolic and diastolic CHF exacerbation, now requiring oxygen use Principal Problem:  *CHF (congestive heart failure) Active Problems:  HYPERLIPIDEMIA  MAJOR DEPRESSIVE DISORDER RECURRENT EPISODE MILD  SLEEP APNEA, OBSTRUCTIVE, MILD  HYPERTENSION, CONTROLLED  PAROXYSMAL ATRIAL FIBRILLATION  Muscle weakness (generalized)  AORTIC VALVE REPLACEMENT, HX OF  Elevated troponin  Acute diastolic CHF (congestive heart failure)  Tongue ulcer  Hypokalemia  Discharge Condition: Stable  Diet recommendation: Heart healthy diet discussed in details   History of present illness:  Pt is 78 yo female with history of diastolic and systolic CHF stage IV NYHA at baseline (although most recent echocardiogram in July 2013 mentioned normal EF, prior echocardiograms have shown decreased EF @ 45 percent). Pt pesentedo the ED with main concern of progressively worsening increase in bilateral lower extremity swelling and  weight gain of 10 lbs in the last week prior to admission. Pt also endorsed progressively worsening exertional dyspnea and usually relived by rest. She has denied chest pain, fevers, chill.   Hospital Course:  Severe tachycardia when ambulating - on 08/03/12 patient HR sustained into 200s after getting up to go to bathroom  - amiodarone increased to 200 mg daily and coreg increased to 12.5 mg bid on 08/03/12  Acute on chronic diastolic heart failure  -04/12/2012 echocardiogram shows ejection fraction 60-65%  -Continue furosemide 80 mg IV every 8 hours - diuretic is adjusted by Cardiology  -Fluid restriction, daily weights  -neg 10,654 cc since admission  -aldactone added on 08/01/12  Elevated troponin  -Likely demand ischemia/CHF  -Repeat troponins negative  -[facility] following  Facial/Tongue Pain  -??trigeminal neuralgia vs referred pain from tongue lesion  - ENT appt with Dr. Pollyann Kennedy 08/08/2012 at 3:10 PM  -Exam did not reveal any facial edema or exophthalmos or airway compromise  -viscous lidocaine--pt refusing because of taste  -pt eating 100% of meals without difficulty or dysphagia  -CT maxillofacial 08/02/12 wnl  Hypertension  -Well controlled  -continue carvedilol but note that dose was increased to 12.5 mg BID Diabetes mellitus type 2  -increased Lantus 24 units at bedtime  -NovoLog sliding scale  -Accu-Checks are much improved.  -Hemoglobin A1c 8.1  Deconditioning  -continue PT/OT  -skilled nursing facility placement  Chronic thrombocytopenia  -No evidence of active bleeding  -Continue to monitor   Procedures/Studies: Dg Chest 2 View 07/28/2012    IMPRESSION:  Cardiomegaly.  No acute cardiopulmonary abnormality seen.    Ct Maxillofacial W/cm 08/02/2012    IMPRESSION:   1.  No  acute abnormalities involving the maxillofacial structures. Specifically, no evidence of abscess, mass, or lymphadenopathy.  2.  Atherosclerosis at the origin of both internal carotid  arteries without evidence of stenosis.  3.  Degenerative changes involving the right temporomandibular joint.    Consultations:  Cardiology  Antibiotics:  None  Discharge Exam: Filed Vitals:   08/04/12 1039  BP: 129/69  Pulse: 97  Temp:   Resp:    Filed Vitals:   08/04/12 0659 08/04/12 0738 08/04/12 0741 08/04/12 1039  BP: 132/73 110/59  129/69  Pulse: 97 97  97  Temp:      TempSrc:      Resp:      Height:      Weight:      SpO2:  87% 97%     General: Pt is alert, follows commands appropriately, not in acute distress Cardiovascular: Regular rate and rhythm, S1/S2 +, no murmurs, no rubs, no gallops Respiratory: Clear to auscultation bilaterally, no wheezing, no crackles, no rhonchi Abdominal: Soft, non tender, non distended, bowel sounds +, no guarding Extremities: trace bilateral pitting edema, no cyanosis, pulses palpable bilaterally DP and PT Neuro: Grossly nonfocal  Discharge Instructions  Discharge Orders    Future Appointments: Provider: Department: Dept Phone: Center:   09/25/2012 11:15 AM Rachael Fee [facility] [phone] None   09/25/2012 11:45 AM Josph Macho, MD [facility] [phone] None   09/25/2012 12:00 PM [facility] [place] [facility] [phone] None     Future Orders Please Complete By Expires   Diet - low sodium heart healthy      Diet - low sodium heart healthy      Increase activity slowly      Increase activity slowly          Medication List     As of 08/04/2012 11:28 AM    STOP taking these medications         metoprolol tartrate 25 MG tablet   Commonly known as: LOPRESSOR      TAKE these medications         acetaminophen 325 MG tablet   Commonly known as: TYLENOL   Take 0.5 tablets (162.5 mg total) by mouth every 6 (six) hours as needed. For pain      ACIDOPHILUS PROBIOTIC 100 MG Caps   Take 1 capsule by mouth daily with  breakfast.      amiodarone 200 MG tablet   Commonly known as: PACERONE   Take 1 tablet (200 mg total) by mouth daily.      aspirin EC 81 MG tablet   Take 1 tablet (81 mg total) by mouth daily.      carvedilol 12.5 MG tablet   Commonly known as: COREG   Take 1 tablet (12.5 mg total) by mouth 2 (two) times daily with a meal.      ciprofloxacin-dexamethasone otic suspension   Commonly known as: CIPRODEX   Place 4 drops into both ears 2 (two) times daily as needed. For otitis media      diphenhydrAMINE-zinc acetate cream   Commonly known as: BENADRYL   Apply 1 application topically 2 (two) times daily as needed. For itching      docusate sodium 100 MG capsule   Commonly known as: COLACE   Take 1 capsule (100 mg total) by mouth 3 (three) times daily as needed. For constipation  donepezil 10 MG tablet   Commonly known as: ARICEPT   Take 1 tablet (10 mg total) by mouth at bedtime.      ferrous sulfate 325 (65 FE) MG tablet   Take 1 tablet (325 mg total) by mouth 2 (two) times daily.      fish oil-omega-3 fatty acids 1000 MG capsule   Take 1 capsule (1 g total) by mouth daily.      furosemide 40 MG tablet   Commonly known as: LASIX   Take 1 tablet (40 mg total) by mouth 2 (two) times daily. 4 tabs in the am, 2 tabs at bedtime      gabapentin 600 MG tablet   Commonly known as: NEURONTIN   Take 0.5-1 tablets (300-600 mg total) by mouth 2 (two) times daily. Take 1/2 tab in the morning and 1 tab at bedtime      insulin glargine 100 UNIT/ML injection   Commonly known as: LANTUS   Inject 24 Units into the skin daily.      metolazone 2.5 MG tablet   Commonly known as: ZAROXOLYN   Take 1 tablet (2.5 mg total) by mouth once a week.      multivitamin tablet   Take 1 tablet by mouth daily.      PARoxetine 40 MG tablet   Commonly known as: PAXIL   Take 1 tablet (40 mg total) by mouth every morning.      polyethylene glycol packet   Commonly known as: MIRALAX / GLYCOLAX    Take 17 g by mouth daily as needed. For constipation      potassium chloride SA 20 MEQ tablet   Commonly known as: K-DUR,KLOR-CON   Take 1 tablet (20 mEq total) by mouth 3 (three) times daily.      simvastatin 10 MG tablet   Commonly known as: ZOCOR   Take 1 tablet (10 mg total) by mouth at bedtime.      spironolactone 12.5 mg Tabs   Commonly known as: ALDACTONE   Take 0.5 tablets (12.5 mg total) by mouth 2 (two) times daily.      triamcinolone 0.1 % paste   Commonly known as: KENALOG   Place 1 application onto teeth 4 (four) times daily.           Follow-up Information    Schedule an appointment as soon as possible for a visit with Donato Schultz, MD.   Contact information:   [address] [place] [address] [phone]       Follow up with [facility] . On 08/08/2012. (Dr. Pollyann Kennedy at 3:10 PM)    Contact information:   [phone]           The results of significant diagnostics from this hospitalization (including imaging, microbiology, ancillary and laboratory) are listed below for reference.     Microbiology: Recent Results (from the past 240 hour(s))  MRSA PCR SCREENING     Status: Normal   Collection Time   07/29/12  2:45 AM      Component Value Range Status Comment   MRSA by PCR NEGATIVE  NEGATIVE Final      Labs: Basic Metabolic Panel:  Lab 08/04/12 8469 08/03/12 0530 08/02/12 0628 08/01/12 0600 07/31/12 0550 07/28/12 2111  NA 137 138 139 137 140 --  K 3.7 3.6 3.4* 3.3* 3.4* --  CL 93* 92* 95* 96 98 --  CO2 35* 35* 34* 34* 32 --  GLUCOSE 145* 131* 103* 141* 131* --  BUN 25* 23 22 20 20  --  CREATININE 1.38* 1.41* 1.28* 1.25* 1.23* --  CALCIUM 9.7 9.9 9.7 9.5 9.2 --  MG -- -- -- -- -- 2.1  PHOS -- -- -- -- -- 3.3   CBC:  Lab 08/02/12 0628 08/01/12 0600 07/30/12 0630 07/29/12 0201 07/28/12 2111 07/28/12 1536  WBC 5.4 4.8 4.7 5.0 5.1 --  NEUTROABS -- -- -- -- -- 3.5  HGB 12.3 11.9* 11.9* 11.8* 13.1 --  HCT 38.4 36.6 37.7 36.9  40.6 --  MCV 96.0 93.8 95.0 95.3 96.2 --  PLT 101* 93* 104* 103* 121* --   Cardiac Enzymes:  Lab 07/29/12 0820 07/29/12 0201 07/28/12 2112  CKTOTAL -- -- --  CKMB -- -- --  CKMBINDEX -- -- --  TROPONINI <0.30 <0.30 <0.30   BNP: BNP (last 3 results)  Basename 07/28/12 1536 04/11/12 2300 11/22/11 1116  PROBNP 5777.0* 17127.0* 7579.0*   CBG:  Lab 08/04/12 0653 08/03/12 2107 08/03/12 1644 08/03/12 1148 08/03/12 0623  GLUCAP 140* 189* 223* 179* 120*    SIGNED: Time coordinating discharge: Over 30 minutes  Debbora Presto, MD  [facility] 08/04/2012, 11:28 AM Pager [phone]  If 7PM-7AM, please contact night-coverage [email or web address] Password [email or web address]

## 2012-08-04 NOTE — Progress Notes (Signed)
IV d/c'd.  Tele d/c'd.  Pt d/c'd to SNF.  Home meds and d/c instructions discussed with pt and pt family.  Both deny any questions or concerns at this time.  Pt leaving unit via wheelchair and appears in no acute distress. Nino Glow RN

## 2012-08-04 NOTE — Progress Notes (Signed)
Physical Therapy Treatment Patient Details Name: NOELL LORENSEN MRN: [id number] DOB: [birth year] Today's Date: 08/04/2012 Time: 6295-2841 PT Time Calculation (min): 23 min  PT Assessment / Plan / Recommendation Comments on Treatment Session  Pt with CHF continues to progress well with ambulation and HEp. Encouraged continued mobility daily.     Follow Up Recommendations        Does the patient have the potential to tolerate intense rehabilitation     Barriers to Discharge        Equipment Recommendations       Recommendations for Other Services    Frequency     Plan Discharge plan remains appropriate;Frequency remains appropriate    Precautions / Restrictions Precautions Precautions: Fall   Pertinent Vitals/Pain HR 97 at rest before and after activity Up to 127 with gait Sats 98% on 2L at rest 91% on RA with ambulation    Mobility  Bed Mobility Bed Mobility: Supine to Sit Supine to Sit: 6: Modified independent (Device/Increase time);HOB elevated;With rails (HOb 25degrees) Details for Bed Mobility Assistance: increased time to complete Transfers Sit to Stand: 5: Supervision;From bed Stand to Sit: 5: Supervision;To chair/3-in-1;With armrests Details for Transfer Assistance: cueing for safety and hand placement Ambulation/Gait Ambulation/Gait Assistance: 5: Supervision Ambulation Distance (Feet): 250 Feet Assistive device: Rolling walker Ambulation/Gait Assistance Details: cueing for posture and position in RW. HR up to 127 with gait but with standing pause down to 105 no CP Gait Pattern: Wide base of support;Step-through pattern;Decreased stride length Gait velocity: decreased    Exercises General Exercises - Lower Extremity Long Arc Quad: AROM;Both;20 reps;Seated Hip ABduction/ADduction: AROM;Both;20 reps;Seated Hip Flexion/Marching: AROM;Both;20 reps;Seated   PT Diagnosis:    PT Problem List:   PT Treatment Interventions:     PT Goals Acute Rehab PT Goals PT  Goal: Supine/Side to Sit - Progress: Progressing toward goal PT Goal: Ambulate - Progress: Progressing toward goal PT Goal: Perform Home Exercise Program - Progress: Progressing toward goal  Visit Information  Last PT Received On: 08/04/12 Assistance Needed: +1    Subjective Data  Subjective: I feel like I'm doing better   Cognition  Overall Cognitive Status: Appears within functional limits for tasks assessed/performed Arousal/Alertness: Awake/alert Orientation Level: Appears intact for tasks assessed Behavior During Session: [facility] for tasks performed    Balance     End of Session PT - End of Session Equipment Utilized During Treatment: Gait belt Activity Tolerance: Patient tolerated treatment well Patient left: in chair;with call bell/phone within reach Nurse Communication: Mobility status   GP     Toney Sang Beth 08/04/2012, 1:17 PM Delaney Meigs, PT [phone]

## 2012-08-07 ENCOUNTER — Non-Acute Institutional Stay: Payer: Self-pay | Admitting: Family Medicine

## 2012-08-07 ENCOUNTER — Encounter: Payer: Self-pay | Admitting: Family Medicine

## 2012-08-07 DIAGNOSIS — I509 Heart failure, unspecified: Secondary | ICD-10-CM

## 2012-08-07 DIAGNOSIS — Z593 Problems related to living in residential institution: Secondary | ICD-10-CM | POA: Insufficient documentation

## 2012-08-07 DIAGNOSIS — G309 Alzheimer's disease, unspecified: Secondary | ICD-10-CM

## 2012-08-07 DIAGNOSIS — R531 Weakness: Secondary | ICD-10-CM

## 2012-08-07 DIAGNOSIS — I10 Essential (primary) hypertension: Secondary | ICD-10-CM

## 2012-08-07 NOTE — Progress Notes (Signed)
  Subjective:    Patient ID: [name], female    DOB: [birth year], 78 y.o.   MRN: [id number]  HPI Here for rehabilitation after a large diuresis in the hospital. Also bothered by facial and tongue pain, followed by Dr Pollyann Kennedy, ENT. Sees Dr Myna Hidalgo for chronic anemia; hasn't needed an injection recently. Hasn't been very active at home. Last fall 2 years ago.  Lives with her daughter, who is a home care nurse, and son-in-law.    Review of Systems     Objective:   Physical Exam  Constitutional: She is oriented to person, place, and time.       Generalized obesity   HENT:       .05 x 1 cm shallow ulcer left lower mid tongue No palpable cervical adenopathy  Cardiovascular: Normal rate and regular rhythm.        Grade 2/6 systolic murmur LLSB with click for S2 1+DP pulse on left absent PT right foot absent pulses  Pulmonary/Chest: Effort normal and breath sounds normal. She has no wheezes. She has no rales.  Abdominal: Soft. There is no tenderness.  Musculoskeletal: She exhibits edema.       1+ bilateral ankles Bilateral hallux valgus  Neurological: She is oriented to person, place, and time.  Skin:       Lower legs with mild stasis changes of hyperpigmentation Purplish hyperemia when dependent Injection ecchymoses over abdomen  Psychiatric: She has a normal mood and affect. Her behavior is normal. Judgment and thought content normal.       Registers 3 words, recalls 2           Assessment & Plan:

## 2012-08-07 NOTE — Assessment & Plan Note (Signed)
Well controlled 

## 2012-08-07 NOTE — Assessment & Plan Note (Signed)
Will check vitamin D. She's motivated to return to living with her daughter and son-in-law and only plans to stay in the nursing home one week.

## 2012-08-07 NOTE — Assessment & Plan Note (Signed)
Weight decrease nearly 30 lbs in the hospital. Will continue discharge medications and follow daily weights.

## 2012-08-07 NOTE — Progress Notes (Signed)
Subjective:     Patient ID: [name], female   DOB: July 03, 1933, 78 y.o.   MRN: [id number]  HPI Comments: 78 yo F with known CHF, a fib, HTN, HLD, obesity, s/p aortic valve replacement, depression, mild CKD (baseline Cr 1.2-1.4) who presents to [facility] after admission from 11/1-11/8/13 for CHF exacerbation.  While in the hospital, patient was diuresed with a 40lb weight loss resulting (264 --> 224lb).  Patient has both systolic and diastolic heart failure with ECHO in 03/2012 showing normal EF (60-65%), but prior EFs at 45%.  On hospital admission, patient had had 10lb weight gain, increase in LE edema, and worsening SOB.  Patient is currently on aldactone and lasix.  During her hospitalization, she was also noted to have increased HR with ambulation to the 200's, resulting in an increase in her amiodarone to 200mg  and coreg to 12.5mg  BID.  Patient currently reports that she is feeling well, does not feel like her heart is beating fast when walking around, and is not having any shortness of breath or chest pain.  She feels like she is ready to go home already, but came to [facility] at the instance of her daughter for some strengthening.  She does still feel a little tired, but thinks that it is because she has been out of her room and walking around most of the day today already.    Prior to hospital admission, patient was living in her own home, which she has owned for 60 years, but did have the help of her daughter, who is an Charity fundraiser and social workers, as well as her son-in-law.  She has another daughter, Misty Stanley, who is a Rec therapist at [employer], and a son who lives near by but who "is not well" (lost a leg after a motorcycle accident).   Prior to admission, she had an aide that would come 2 days per week and then friends, family, and neighbors who would come check in on her.  She still did some cleaning, but gets meals-on-wheels and does not have to cook.  She was performing all ADLs, including dressing, toileting,  feeding herself, and bathing herself.  She does wear pads/adult diapers at night time because of leakage from the lasix.  Her daughter, who lives with her, is in charge of her finances.  Her husband passed away 7 years ago.     Review of Systems  Constitutional: Negative for fever and appetite change.  HENT: Positive for mouth sores. Negative for congestion, rhinorrhea, trouble swallowing and sinus pressure.   Respiratory: Negative for cough, shortness of breath and wheezing.   Cardiovascular: Positive for leg swelling. Negative for chest pain.  Gastrointestinal: Negative for nausea, vomiting, abdominal pain and diarrhea.  Genitourinary: Negative for dysuria, hematuria and difficulty urinating.  Musculoskeletal: Positive for arthralgias.  Skin: Negative for rash and wound.  Neurological: Negative for dizziness, syncope, weakness and numbness.       Objective:   Physical Exam  Constitutional: She is oriented to person, place, and time. She appears well-developed and well-nourished. She is cooperative. She does not appear ill. No distress.  HENT:  Head: Normocephalic and atraumatic.  Nose: Nose normal.  Mouth/Throat: Oropharynx is clear and moist. Oral lesions present. No oropharyngeal exudate or posterior oropharyngeal erythema.    Eyes: Conjunctivae normal and EOM are normal. Pupils are equal, round, and reactive to light.  Neck: Normal range of motion. Neck supple. No thyromegaly present.  Cardiovascular: Normal rate, S1 normal and intact distal pulses.  An irregularly irregular rhythm present.  No murmur heard.      S2 is loud click  Pulmonary/Chest: Effort normal and breath sounds normal. No respiratory distress. She has no decreased breath sounds. She has no wheezes. She has no rhonchi. She has no rales. Right breast exhibits no inverted nipple, no mass, no skin change and no tenderness. Left breast exhibits no inverted nipple, no mass, no skin change and no tenderness. Breasts are  symmetrical.  Abdominal: Soft. Bowel sounds are normal. She exhibits no distension. There is no tenderness.  Musculoskeletal: Normal range of motion. She exhibits edema. She exhibits no tenderness.       1-2+ BLE pitting edema  Lymphadenopathy:    She has no cervical adenopathy.  Neurological: She is alert and oriented to person, place, and time. She has normal strength. No cranial nerve deficit.  Skin: Skin is warm, dry and intact. She is not diaphoretic.       Well healed scar midline chest from previous cardiac surgery, well healed scars bilateral knees from previous surgeries; bruises on BUE and abdomen from hospitalization   Psychiatric: She has a normal mood and affect. Her speech is normal.       Pt able to still recall 2/3 words from MMSE administered hours before       Assessment/Plan:     78 yo F with PMHx significant for CHF, a fib, HTN, HLD, tongue ulcer presents to [facility] for short term rehab/strengthening -Check BMET to follow K and Cr -Check TSH (on amiodarone) -Check Vit D (25-OH) -PT/OT for strengthening  -Monitor daily weights to ensure no re-accumulating fluid -F/u with ENT on 08/08/12

## 2012-08-07 NOTE — Assessment & Plan Note (Signed)
Appears to be mild. MMSE if she stays long enough

## 2012-08-10 MED ORDER — CARVEDILOL 12.5 MG PO TABS: 6.2500 mg | ORAL_TABLET | Freq: Two times a day (BID) | ORAL | Status: DC

## 2012-08-10 MED ORDER — AMIODARONE HCL 200 MG PO TABS: 100.0000 mg | ORAL_TABLET | Freq: Every day | ORAL | Status: DC

## 2012-08-11 ENCOUNTER — Non-Acute Institutional Stay: Payer: Self-pay | Admitting: Family Medicine

## 2012-08-11 ENCOUNTER — Other Ambulatory Visit: Payer: Self-pay | Admitting: Family Medicine

## 2012-08-11 MED ORDER — ASPIRIN EC 81 MG PO TBEC: 81.0000 mg | DELAYED_RELEASE_TABLET | Freq: Every day | ORAL | Status: DC

## 2012-08-11 MED ORDER — SIMVASTATIN 10 MG PO TABS: 10.0000 mg | ORAL_TABLET | Freq: Every day | ORAL | Status: DC

## 2012-08-11 MED ORDER — SPIRONOLACTONE 12.5 MG HALF TABLET: 12.5000 mg | ORAL_TABLET | Freq: Two times a day (BID) | ORAL | Status: DC

## 2012-08-11 MED ORDER — AMIODARONE HCL 200 MG PO TABS: 100.0000 mg | ORAL_TABLET | Freq: Every day | ORAL | Status: DC

## 2012-08-11 MED ORDER — DOCUSATE SODIUM 100 MG PO CAPS: 100.0000 mg | ORAL_CAPSULE | Freq: Two times a day (BID) | ORAL | Status: DC | PRN

## 2012-08-11 MED ORDER — FERROUS SULFATE 325 (65 FE) MG PO TABS: 325.0000 mg | ORAL_TABLET | Freq: Two times a day (BID) | ORAL | Status: AC

## 2012-08-11 MED ORDER — GABAPENTIN 600 MG PO TABS: 300.0000 mg | ORAL_TABLET | Freq: Two times a day (BID) | ORAL | Status: DC

## 2012-08-11 MED ORDER — CARVEDILOL 6.25 MG PO TABS: 6.2500 mg | ORAL_TABLET | Freq: Two times a day (BID) | ORAL | Status: DC

## 2012-08-11 MED ORDER — DONEPEZIL HCL 10 MG PO TABS: 10.0000 mg | ORAL_TABLET | Freq: Every day | ORAL | Status: AC

## 2012-08-11 MED ORDER — FUROSEMIDE 40 MG PO TABS: 40.0000 mg | ORAL_TABLET | Freq: Two times a day (BID) | ORAL | Status: DC

## 2012-08-11 MED ORDER — POTASSIUM CHLORIDE CRYS ER 20 MEQ PO TBCR: 20.0000 meq | EXTENDED_RELEASE_TABLET | Freq: Three times a day (TID) | ORAL | Status: DC

## 2012-08-11 MED ORDER — METOLAZONE 2.5 MG PO TABS: 2.5000 mg | ORAL_TABLET | ORAL | Status: DC

## 2012-08-11 MED ORDER — INSULIN GLARGINE 100 UNIT/ML ~~LOC~~ SOLN: 24.0000 [IU] | Freq: Every day | SUBCUTANEOUS | Status: DC

## 2012-08-11 NOTE — Progress Notes (Signed)
Patient with plans to d/c from [facility] prior to her 10 days of allowed SNF time expires (which will be 08/14/12).  Weights remained stable while in SNF without much change.  Patient did have Cardiology appt with Dr. Anne Fu on 11/13 who reccomends to continue daily weights, with f/u in 1 week with an EKG and follow up again 09/06/12.  He would like for pt to continue O2 PRN during the day, but encourage use qhs.  He also decreased amiodarone to 100mg  daily and decreased coreg to 6.25 mg BID.  Otherwise, there were no acute events with the patient during her stay at [facility].  Should follow up with PCP within 2 weeks of discharge.  One month supply of medications were sent to patient's preferred pharmacy, [facility] on [address].

## 2012-08-14 ENCOUNTER — Other Ambulatory Visit: Payer: Self-pay | Admitting: Family Medicine

## 2012-08-14 ENCOUNTER — Non-Acute Institutional Stay: Payer: Self-pay | Admitting: Family Medicine

## 2012-08-14 ENCOUNTER — Telehealth: Payer: Self-pay | Admitting: Internal Medicine

## 2012-08-14 MED ORDER — DOCUSATE SODIUM 100 MG PO CAPS: 100.0000 mg | ORAL_CAPSULE | Freq: Two times a day (BID) | ORAL | Status: DC | PRN

## 2012-08-14 MED ORDER — HYDROCORTISONE 2.5 % RE CREA: TOPICAL_CREAM | Freq: Every day | RECTAL | Status: AC

## 2012-08-14 MED ORDER — SENNA 15 MG PO TABS: 1.0000 | ORAL_TABLET | Freq: Every day | ORAL | Status: DC | PRN

## 2012-08-14 NOTE — Progress Notes (Signed)
Family concerned about continued pain in RLE (from fall about a week ago).  Has h/o cellulitis with MRSA.   Pt afebrile and feeling well.  Right leg as a 0.5x2cm laceration that is, perhaps, 0.3cm deep.  There is no drainage or surrounding erythema.  No warmth.  Area is still painful to touch.  At this time, no evidence of cellulitis.  Will continue to monitor closely.  If fevers develop or wound begins to drain would have low threshold for starting abx.

## 2012-08-14 NOTE — Telephone Encounter (Signed)
Her mother is at [facility] and is concerned over the wound on her left lower leg - wanted to ask if someone would keep a check on it.

## 2012-08-14 NOTE — Telephone Encounter (Signed)
Fwd to [facility] resident and Dr. Sheffield Slider.

## 2012-08-14 NOTE — Telephone Encounter (Signed)
See documentation in other patient encounter.  Not cellulitis at this time.

## 2012-08-16 ENCOUNTER — Non-Acute Institutional Stay: Payer: Self-pay | Admitting: Family Medicine

## 2012-08-16 DIAGNOSIS — F0280 Dementia in other diseases classified elsewhere without behavioral disturbance: Secondary | ICD-10-CM

## 2012-08-16 DIAGNOSIS — I10 Essential (primary) hypertension: Secondary | ICD-10-CM

## 2012-08-16 DIAGNOSIS — S81809A Unspecified open wound, unspecified lower leg, initial encounter: Secondary | ICD-10-CM

## 2012-08-17 ENCOUNTER — Non-Acute Institutional Stay: Payer: Self-pay | Admitting: Family Medicine

## 2012-08-17 ENCOUNTER — Encounter: Payer: Self-pay | Admitting: Family Medicine

## 2012-08-17 ENCOUNTER — Telehealth: Payer: Self-pay | Admitting: Family Medicine

## 2012-08-17 DIAGNOSIS — S81809A Unspecified open wound, unspecified lower leg, initial encounter: Secondary | ICD-10-CM | POA: Insufficient documentation

## 2012-08-17 MED ORDER — SULFAMETHOXAZOLE-TRIMETHOPRIM 800-160 MG PO TABS: 1.0000 | ORAL_TABLET | Freq: Two times a day (BID) | ORAL | Status: DC

## 2012-08-17 NOTE — Assessment & Plan Note (Signed)
well controlled  

## 2012-08-17 NOTE — Telephone Encounter (Signed)
Updated d/c meds

## 2012-08-17 NOTE — Progress Notes (Signed)
Family Medicine Teaching Service Nursing Home Discharge Summary  Patient name: [name] [facility] Medical record number: [id number] Date of birth: [birth year] Age: 78 y.o. Gender: female Date of Discharge: 08/18/2012  Primary Care Provider: [name], MD  Indication for SNF placement: Deconditioning secondary to acute CHF exacerbation Discharge Diagnoses:  Patient Active Problem List  Diagnosis  . AODM  . DIABETIC PERIPHERAL NEUROPATHY  . HYPERLIPIDEMIA  . OBESITY, MORBID  . ANEMIA OF RENAL FAILURE  . MAJOR DEPRESSIVE DISORDER RECURRENT EPISODE MILD  . SLEEP APNEA, OBSTRUCTIVE, MILD  . ALZHEIMERS DISEASE  . HYPERTENSION, CONTROLLED  . PAROXYSMAL ATRIAL FIBRILLATION  . ATHEROSCLEROTIC CARDIOVASCULAR DISEASE  . Muscle weakness (generalized)  . AORTIC VALVE REPLACEMENT, HX OF  . CHF (congestive heart failure)  . Internal hemorrhoids with prolapse & bleeding  . Alopecia  . Tongue ulcer  . Weakness generalized  . Person living in residential institution  . Open wound, lower leg   Brief SNF Course: Patient was admitted to skilled nursing facility due to significant deconditioning following hospitalization for acute CHF exacerbation.  She arrived at the facility and underwent physical and occupational therapy.  She progressed relatively well and, per her and family request, decided to leave on 08/17/2012.  All acute inpatient rehab goals were met and she was discharged with home health PT/OT.  There were two issues of note during her time at the facility.  First, she hit her right lower leg on a chair resulting in a 0.3x2cm wound with some loss of subcutaneous tissue.  This was treated with topical dressings but continued to be relatively painful despite improvement in size.  She was started on antibiotics (Bactrim) and was discharged still on this medication with a plan to complete a 7 day course.  Second, her weight was noted to drop 11 pounds the day prior to discharge.  She was seen  by cardiology on that day, at which appointment she weighed 214 lb, and they were not overly concerned.  They have drawn a BMET and will contact the patient if changes need to be made to her diuretic regimen.  Patient was asymptomatic at the time.  Discharge Medications:   Current Outpatient Prescriptions  Medication Sig Dispense Refill  . acetaminophen (TYLENOL) 325 MG tablet Take 0.5 tablets (162.5 mg total) by mouth every 6 (six) hours as needed. For pain  30 tablet  1  . amiodarone (PACERONE) 200 MG tablet Take 0.5 tablets (100 mg total) by mouth daily.  30 tablet  0  . aspirin EC 81 MG tablet Take 1 tablet (81 mg total) by mouth daily.  31 tablet  0  . carvedilol (COREG) 6.25 MG tablet Take 1 tablet (6.25 mg total) by mouth 2 (two) times daily with a meal.  60 tablet  0  . ciprofloxacin-dexamethasone (CIPRODEX) otic suspension Place 4 drops into both ears 2 (two) times daily as needed. For otitis media  7.5 mL  1  . diphenhydrAMINE-zinc acetate (BENADRYL) cream Apply 1 application topically 2 (two) times daily as needed. For itching  28.4 g  1  . docusate sodium (COLACE) 100 MG capsule Take 1 capsule (100 mg total) by mouth 2 (two) times daily as needed. For constipation  30 capsule  0  . donepezil (ARICEPT) 10 MG tablet Take 1 tablet (10 mg total) by mouth at bedtime.  30 tablet  0  . ferrous sulfate 325 (65 FE) MG tablet Take 1 tablet (325 mg total) by mouth 2 (two) times daily.  60  tablet  0  . fish oil-omega-3 fatty acids 1000 MG capsule Take 1 capsule (1 g total) by mouth daily.  30 capsule  1  . furosemide (LASIX) 40 MG tablet Take 1 tablet (40 mg total) by mouth 2 (two) times daily. 4 tabs in the am, 2 tabs at bedtime  60 tablet  0  . gabapentin (NEURONTIN) 600 MG tablet Take 0.5-1 tablets (300-600 mg total) by mouth 2 (two) times daily. Take 1/2 tab in the morning and 1 tab at bedtime  45 tablet  0  . hydrocortisone (ANUSOL-HC) 2.5 % rectal cream Place rectally daily.  30 g  0  .  insulin glargine (LANTUS) 100 UNIT/ML injection Inject 24 Units into the skin daily.  10 mL  0  . Lactobacillus (ACIDOPHILUS PROBIOTIC) 100 MG CAPS Take 1 capsule by mouth daily with breakfast.      . metolazone (ZAROXOLYN) 2.5 MG tablet Take 1 tablet (2.5 mg total) by mouth once a week.  4 tablet  0  . Multiple Vitamin (MULTIVITAMIN) tablet Take 1 tablet by mouth daily.        Marland Kitchen PARoxetine (PAXIL) 40 MG tablet Take 1 tablet (40 mg total) by mouth every morning.  30 tablet  1  . polyethylene glycol (MIRALAX / GLYCOLAX) packet Take 17 g by mouth daily as needed. For constipation      . potassium chloride SA (K-DUR,KLOR-CON) 20 MEQ tablet Take 1 tablet (20 mEq total) by mouth 3 (three) times daily.  90 tablet  0  . Sennosides (SENNA) 15 MG TABS Take 1 tablet (15 mg total) by mouth daily as needed (constipation).  120 each    . simvastatin (ZOCOR) 10 MG tablet Take 1 tablet (10 mg total) by mouth at bedtime.  30 tablet  0  . spironolactone (ALDACTONE) 12.5 mg TABS Take 0.5 tablets (12.5 mg total) by mouth 2 (two) times daily.  60 tablet  0  . sulfamethoxazole-trimethoprim (BACTRIM DS,SEPTRA DS) 800-160 MG per tablet Take 1 tablet by mouth 2 (two) times daily.  16 tablet  0  . triamcinolone (KENALOG) 0.1 % paste Place 1 application onto teeth 4 (four) times daily.       Issues for Follow Up:  1. Close monitoring of weight along with potassium and renal function 2. Assure that right lower extremity wound continues healing   Janell Keeling, MD 08/17/2012, 4:18 PM

## 2012-08-17 NOTE — Assessment & Plan Note (Signed)
Will start TMP/sulfa due to increasing pain and erythema

## 2012-08-17 NOTE — Progress Notes (Signed)
  Subjective:    Patient ID: [name], female    DOB: 06/11/1933, 78 y.o.   MRN: [id number]  HPI Her right anterior lower leg injury continues to be increasingly sore and hurts when she walks. She says she didn't fall, but rather banged it on a chair. No chills. The pain is sharp and intermittent at rest. Fair appetite. She's planning on going home in 2 days.    Review of Systems     Objective:   Physical Exam Right leg as a 0.5x2cm laceration that appears to be through the dermis. Small amount of sanguinous drainage on a xeroform and overlying foam backed hydrocolloid dressing. Faint erythema 2-3 cm around the wound, but very tender to touch.         Assessment & Plan:

## 2012-08-18 ENCOUNTER — Encounter: Payer: Self-pay | Admitting: Family Medicine

## 2012-08-22 ENCOUNTER — Telehealth: Payer: Self-pay | Admitting: Internal Medicine

## 2012-08-22 ENCOUNTER — Telehealth: Payer: Self-pay | Admitting: Family Medicine

## 2012-08-22 NOTE — Telephone Encounter (Signed)
Clarified wound care instruction.  (Apparently she was given the wrong discharge instructions and wrong medications from [facility].)  Also will resent DC summary to PCP.  Will try to have computer fax dc summary to patient and will also send message to front desk requesting that it be printed and mailed.

## 2012-08-22 NOTE — Telephone Encounter (Signed)
Could you please print the patient DC summary from [facility] and mail it to her address?  The family was not given it when discharged and does not have a correct list of medications (apparently was given another patient's med list at discharge).

## 2012-08-22 NOTE — Telephone Encounter (Signed)
Will fwd. To Dr.Ritch. Lorenda Hatchet, Renato Battles

## 2012-08-22 NOTE — Telephone Encounter (Signed)
Daughter is calling because her mother has been discharged from [facility] where she had been seen by Dr. Sheffield Slider.  Daughter says the Discharge instruction were very poor leaving her with questions regarding home care and her medications.  She would like Dr. Sheffield Slider to call her to discuss her mothers needs.

## 2012-09-13 ENCOUNTER — Other Ambulatory Visit: Payer: Self-pay | Admitting: Family Medicine

## 2012-09-25 ENCOUNTER — Ambulatory Visit (HOSPITAL_BASED_OUTPATIENT_CLINIC_OR_DEPARTMENT_OTHER): Payer: Medicare Other | Admitting: Hematology & Oncology

## 2012-09-25 ENCOUNTER — Telehealth: Payer: Self-pay | Admitting: Hematology & Oncology

## 2012-09-25 ENCOUNTER — Other Ambulatory Visit (HOSPITAL_BASED_OUTPATIENT_CLINIC_OR_DEPARTMENT_OTHER): Payer: Medicare Other | Admitting: Lab

## 2012-09-25 ENCOUNTER — Ambulatory Visit: Payer: Medicare Other

## 2012-09-25 VITALS — BP 131/52 | HR 68 | Temp 97.5°F | Resp 16 | Ht 62.0 in | Wt 217.0 lb

## 2012-09-25 DIAGNOSIS — D649 Anemia, unspecified: Secondary | ICD-10-CM

## 2012-09-25 DIAGNOSIS — I509 Heart failure, unspecified: Secondary | ICD-10-CM

## 2012-09-25 DIAGNOSIS — N189 Chronic kidney disease, unspecified: Secondary | ICD-10-CM

## 2012-09-25 DIAGNOSIS — N289 Disorder of kidney and ureter, unspecified: Secondary | ICD-10-CM

## 2012-09-25 DIAGNOSIS — N039 Chronic nephritic syndrome with unspecified morphologic changes: Secondary | ICD-10-CM

## 2012-09-25 DIAGNOSIS — D631 Anemia in chronic kidney disease: Secondary | ICD-10-CM

## 2012-09-25 DIAGNOSIS — E119 Type 2 diabetes mellitus without complications: Secondary | ICD-10-CM

## 2012-09-25 LAB — CBC WITH DIFFERENTIAL (CANCER CENTER ONLY)
BASO%: 1.1 % (ref 0.0–2.0)
Eosinophils Absolute: 0.3 10*3/uL (ref 0.0–0.5)
HCT: 35 % (ref 34.8–46.6)
LYMPH%: 23.7 % (ref 14.0–48.0)
MCV: 95 fL (ref 81–101)
MONO#: 0.7 10*3/uL (ref 0.1–0.9)
NEUT%: 57.1 % (ref 39.6–80.0)
RDW: 15.9 % — ABNORMAL HIGH (ref 11.1–15.7)
WBC: 5.3 10*3/uL (ref 3.9–10.0)

## 2012-09-25 NOTE — Progress Notes (Signed)
This office note has been dictated.

## 2012-09-25 NOTE — Telephone Encounter (Signed)
Mailed 11-2012 schedule

## 2012-09-26 LAB — FERRITIN: Ferritin: 494 ng/mL — ABNORMAL HIGH (ref 10–291)

## 2012-09-26 LAB — RETICULOCYTES (CHCC)
RBC.: 3.81 MIL/uL — ABNORMAL LOW (ref 3.87–5.11)
Retic Ct Pct: 2 % (ref 0.4–2.3)

## 2012-09-26 LAB — IRON AND TIBC: %SAT: 27 % (ref 20–55)

## 2012-09-26 NOTE — Progress Notes (Signed)
CC:   Stacy Showers, MD  DIAGNOSIS:  Anemia of renal insufficiency.  CURRENT THERAPY:  Aranesp 300 mcg subcu as needed for hemoglobin less than 11.  INTERIM HISTORY:  [name] comes in for her followup.  She is doing okay.  She is still quite weak.  She still has some healing issues with a wound on her right lower leg.  [facility]. She did come in with a rolling walker today.  She is on quite a few medications.  She is on amiodarone, which certainly could be an issue with respect to her blood.  She has had no fever.  There has been no change in bowel or bladder habits. On her last iron studies done back in October showed a ferritin of 183 with iron saturation of 20%.  PHYSICAL EXAM:  This is a chronically ill-appearing white female in no obvious distress.  Vital Signs:  Temperature of 97.5, pulse 68, respiratory rate 16, blood pressure 131/82.  Weight is 217.  Head and Neck Exam:  Normocephalic, atraumatic skull.  There are no ocular or oral lesions.  There are no palpable cervical or supraclavicular lymph nodes.  Lungs:  Clear bilaterally.  Cardiac Exam: Slow but regular.  She has occasional extra beat.  She has a 3/6 systolic ejection murmur. Abdominal Exam:  Soft with good bowel sounds.  She is mildly obese.  She has no fluid wave.  There is no palpable hepatosplenomegaly. Extremities: Shows a dressing on the right lower leg.  She has some chronic stasis dermatitis-type changes in her lower legs.  LABORATORY STUDIES:  White cell count is 5.3, hemoglobin 11.5 , hematocrit 35, platelet count 129.  IMPRESSION:  [name] is a 78 year old white female with anemia of renal insufficiency.  She has probably anemia of polypharmacy. She is on a lot of medications.  Thankfully, her blood counts are holding relatively stable.  We will plan to get her back in another 3 months.  We will get her through the wintertime, so she does not come in with any  bad weather.    ______________________________ Josph Macho, M.D. PRE/MEDQ  D:  09/25/2012  T:  09/26/2012  Job:  [id number]

## 2012-10-08 ENCOUNTER — Other Ambulatory Visit: Payer: Self-pay | Admitting: Family Medicine

## 2012-10-10 ENCOUNTER — Encounter (HOSPITAL_BASED_OUTPATIENT_CLINIC_OR_DEPARTMENT_OTHER): Payer: Medicare Other | Attending: General Surgery

## 2012-10-10 DIAGNOSIS — G4733 Obstructive sleep apnea (adult) (pediatric): Secondary | ICD-10-CM | POA: Insufficient documentation

## 2012-10-10 DIAGNOSIS — Z79899 Other long term (current) drug therapy: Secondary | ICD-10-CM | POA: Insufficient documentation

## 2012-10-10 DIAGNOSIS — Z951 Presence of aortocoronary bypass graft: Secondary | ICD-10-CM | POA: Insufficient documentation

## 2012-10-10 DIAGNOSIS — I2510 Atherosclerotic heart disease of native coronary artery without angina pectoris: Secondary | ICD-10-CM | POA: Insufficient documentation

## 2012-10-10 DIAGNOSIS — I4891 Unspecified atrial fibrillation: Secondary | ICD-10-CM | POA: Insufficient documentation

## 2012-10-10 DIAGNOSIS — L97909 Non-pressure chronic ulcer of unspecified part of unspecified lower leg with unspecified severity: Secondary | ICD-10-CM | POA: Insufficient documentation

## 2012-10-10 DIAGNOSIS — I10 Essential (primary) hypertension: Secondary | ICD-10-CM | POA: Insufficient documentation

## 2012-10-11 NOTE — H&P (Signed)
[name], [name]                ACCOUNT NO.:  [id number]  MEDICAL RECORD NO.:  [id number]  LOCATION:  FOOT                         FACILITY:  [facility]  PHYSICIAN:  Joanne Gavel, M.D.        DATE OF BIRTH:  Jul 11, 1933  DATE OF ADMISSION:  10/10/2012 DATE OF DISCHARGE:                             HISTORY & PHYSICAL   CHIEF COMPLAINT:  Chronic venous hypertension with ulcer of right leg.  HISTORY OF PRESENT ILLNESS:  The patient has a long history of stasis dermatitis.  Several months ago, she had severe swelling from congestive heart failure.  She was several liter of fluid over hydrated.  She banged her leg and developed an ulceration.  This has not been particularly treated.  PAST MEDICAL HISTORY:  Significant for aortic stenosis, coronary artery disease, chronic heart failure, obesity, renal insufficiency, atrial fibrillation, hypertension, obstructive sleep apnea, diabetes, dementia, cataracts, retinopathy, anemia, and depression.  SURGICAL HISTORY:  She has had a valve replacement and CABG, right knee replacement, fracture of right femur, and cholecystectomy remotely.  MEDICATIONS:  Gabapentin, Lantus insulin, MiraLAX, benazepril, ferrous sulfate, Colace, simvastatin, amiodarone, furosemide, spironolactone, Coreg, and carvedilol.  ALLERGIES:  Morphine, oxycodone, Wellbutrin, Zyrtec, Celebrex.  REVIEW OF SYSTEMS:  As above.  PHYSICAL EXAMINATION:  VITAL SIGNS:  Temp 97.6, pulse 56, respirations 18, blood pressure 136/75.  Glucose is 186. GENERAL APPEARANCE:  Well developed, somewhat obese.  She appears oriented at present time. CHEST:  Clear. HEART:  In irregular rhythm.  I cannot hear the valve sounds. ABDOMEN:  Not examined. PELVIC:  Examination of sacrum revealed a small 0.3 x 0.8 very superficial decubitus. EXTREMITIES:  In the right lateral leg, there was a 4.0 x 2.9 x 0.2 deep ulceration in the midst of stasis dermatitis.  This is somewhat tender and there is a  little bit tendon and there is a little bit of inflammation.  IMPRESSION:  Chronic venous hypertension with ulceration, right lateral leg, small stage I decubitus of sacrum.  PLAN OF TREATMENT:  DuoDERM to the sacrum, Santyl and Hydrogel and wrap for the leg.  We will get arterial and venous studies and apply more pressure if arterial circulation is adequate.     Joanne Gavel, M.D.     RA/MEDQ  D:  10/10/2012  T:  10/11/2012  Job:  [id number]

## 2012-10-13 ENCOUNTER — Other Ambulatory Visit (HOSPITAL_COMMUNITY): Payer: Self-pay | Admitting: General Surgery

## 2012-10-13 DIAGNOSIS — T148XXA Other injury of unspecified body region, initial encounter: Secondary | ICD-10-CM

## 2012-10-13 DIAGNOSIS — I739 Peripheral vascular disease, unspecified: Secondary | ICD-10-CM

## 2012-10-13 DIAGNOSIS — I878 Other specified disorders of veins: Secondary | ICD-10-CM

## 2012-10-17 ENCOUNTER — Other Ambulatory Visit: Payer: Self-pay | Admitting: Family Medicine

## 2012-10-20 ENCOUNTER — Other Ambulatory Visit: Payer: Self-pay | Admitting: Family Medicine

## 2012-10-27 ENCOUNTER — Ambulatory Visit (HOSPITAL_COMMUNITY)
Admission: RE | Admit: 2012-10-27 | Discharge: 2012-10-27 | Disposition: A | Discharge status: Home / Self Care | Payer: Medicare Other | Source: Ambulatory Visit | Attending: Cardiovascular Disease | Admitting: Cardiovascular Disease

## 2012-10-27 DIAGNOSIS — I739 Peripheral vascular disease, unspecified: Secondary | ICD-10-CM

## 2012-10-27 DIAGNOSIS — T148XXA Other injury of unspecified body region, initial encounter: Secondary | ICD-10-CM

## 2012-10-27 DIAGNOSIS — I878 Other specified disorders of veins: Secondary | ICD-10-CM

## 2012-10-27 DIAGNOSIS — T07XXXA Unspecified multiple injuries, initial encounter: Secondary | ICD-10-CM | POA: Insufficient documentation

## 2012-10-31 ENCOUNTER — Encounter (HOSPITAL_BASED_OUTPATIENT_CLINIC_OR_DEPARTMENT_OTHER): Payer: Medicare Other | Attending: General Surgery

## 2012-10-31 DIAGNOSIS — L8993 Pressure ulcer of unspecified site, stage 3: Secondary | ICD-10-CM | POA: Insufficient documentation

## 2012-10-31 DIAGNOSIS — I872 Venous insufficiency (chronic) (peripheral): Secondary | ICD-10-CM | POA: Insufficient documentation

## 2012-10-31 DIAGNOSIS — L97909 Non-pressure chronic ulcer of unspecified part of unspecified lower leg with unspecified severity: Secondary | ICD-10-CM | POA: Insufficient documentation

## 2012-10-31 DIAGNOSIS — L89109 Pressure ulcer of unspecified part of back, unspecified stage: Secondary | ICD-10-CM | POA: Insufficient documentation

## 2012-11-02 ENCOUNTER — Encounter (HOSPITAL_COMMUNITY): Payer: Medicare Other

## 2012-11-28 ENCOUNTER — Encounter (HOSPITAL_BASED_OUTPATIENT_CLINIC_OR_DEPARTMENT_OTHER): Payer: Medicare Other | Attending: General Surgery

## 2012-11-28 DIAGNOSIS — I87309 Chronic venous hypertension (idiopathic) without complications of unspecified lower extremity: Secondary | ICD-10-CM | POA: Insufficient documentation

## 2012-11-28 DIAGNOSIS — L97809 Non-pressure chronic ulcer of other part of unspecified lower leg with unspecified severity: Secondary | ICD-10-CM | POA: Insufficient documentation

## 2012-12-25 ENCOUNTER — Ambulatory Visit (HOSPITAL_BASED_OUTPATIENT_CLINIC_OR_DEPARTMENT_OTHER): Payer: Medicare Other | Admitting: Medical

## 2012-12-25 ENCOUNTER — Other Ambulatory Visit: Payer: Self-pay | Admitting: Family Medicine

## 2012-12-25 ENCOUNTER — Other Ambulatory Visit (HOSPITAL_BASED_OUTPATIENT_CLINIC_OR_DEPARTMENT_OTHER): Payer: Medicare Other | Admitting: Lab

## 2012-12-25 ENCOUNTER — Ambulatory Visit: Payer: Medicare Other

## 2012-12-25 VITALS — BP 108/58 | HR 96 | Temp 97.6°F | Resp 16 | Ht 62.0 in | Wt 215.0 lb

## 2012-12-25 DIAGNOSIS — I509 Heart failure, unspecified: Secondary | ICD-10-CM

## 2012-12-25 DIAGNOSIS — D649 Anemia, unspecified: Secondary | ICD-10-CM

## 2012-12-25 DIAGNOSIS — D631 Anemia in chronic kidney disease: Secondary | ICD-10-CM

## 2012-12-25 DIAGNOSIS — E119 Type 2 diabetes mellitus without complications: Secondary | ICD-10-CM

## 2012-12-25 DIAGNOSIS — N289 Disorder of kidney and ureter, unspecified: Secondary | ICD-10-CM

## 2012-12-25 LAB — CBC WITH DIFFERENTIAL (CANCER CENTER ONLY)
BASO#: 0 10*3/uL (ref 0.0–0.2)
Eosinophils Absolute: 0.3 10*3/uL (ref 0.0–0.5)
HCT: 39.4 % (ref 34.8–46.6)
HGB: 13.8 g/dL (ref 11.6–15.9)
MCH: 32.2 pg (ref 26.0–34.0)
MCV: 92 fL (ref 81–101)
MONO%: 12 % (ref 0.0–13.0)
NEUT#: 4.4 10*3/uL (ref 1.5–6.5)
NEUT%: 63.7 % (ref 39.6–80.0)
RBC: 4.28 10*6/uL (ref 3.70–5.32)

## 2012-12-25 LAB — FERRITIN: Ferritin: 517 ng/mL — ABNORMAL HIGH (ref 10–291)

## 2012-12-25 LAB — CHCC SATELLITE - SMEAR

## 2012-12-25 LAB — IRON AND TIBC: UIBC: 275 ug/dL (ref 125–400)

## 2012-12-25 NOTE — Progress Notes (Signed)
CC:   [name], MD  DIAGNOSIS:  Anemia of renal insufficiency.  CURRENT THERAPY:  Aranesp 300 mcg subcu as needed for hemoglobin less than 11.  INTERIM HISTORY: [name] presents today for an office followup visit.  She is doing okay.  She states, that the wound.  On her right lower leg has now healed.  She remains on a rolling walker.  She still continues on a lot of medications.  Surprisingly enough, her blood, has remained stable.  She's not had to have an Aranesp injection and quite some time.  The last, time, we checked.  Her ferritin back in December it was 494. Her iron was 80, with 27% saturation.  She, reports, a good appetite.  She denies any nausea, vomiting, diarrhea, constipation, chest pain, shortness breath, or cough.  She denies any fevers, chills, or night sweats.  She denies any obvious, or abnormal bleeding.  She denies any headaches, visual changes, or rashes.  Review of Systems: Constitutional:Negative for malaise/fatigue, fever, chills, weight loss, diaphoresis, activity change, appetite change, and unexpected weight change.  HEENT: Negative for double vision, blurred vision, visual loss, ear pain, tinnitus, congestion, rhinorrhea, epistaxis sore throat or sinus disease, oral pain/lesion, tongue soreness Respiratory: Negative for cough, chest tightness, shortness of breath, wheezing and stridor.  Cardiovascular: Negative for chest pain, palpitations, leg swelling, orthopnea, PND, DOE or claudication Gastrointestinal: Negative for nausea, vomiting, abdominal pain, diarrhea, constipation, blood in stool, melena, hematochezia, abdominal distention, anal bleeding, rectal pain, anorexia and hematemesis.  Genitourinary: Negative for dysuria, frequency, hematuria,  Musculoskeletal: Negative for myalgias, back pain, joint swelling, arthralgias and gait problem.  Skin: Negative for rash, color change, pallor and wound.  Neurological:. Negative for dizziness/light-headedness,  tremors, seizures, syncope, facial asymmetry, speech difficulty, weakness, numbness, headaches and paresthesias.  Hematological: Negative for adenopathy. Does not bruise/bleed easily.  Psychiatric/Behavioral:  Negative for depression, no loss of interest in normal activity or change in sleep pattern.   Physical Exam: This is a chronically, ill appearing 79 year old, white female, in no obvious distress Vitals: Temperature 97.6 degrees, pulse 96, respirations 16, blood pressure 108/58, weight 215 pounds HEENT reveals a normocephalic, atraumatic skull, no scleral icterus, no oral lesions  Neck is supple without any cervical or supraclavicular adenopathy.  Lungs are clear to auscultation bilaterally. There are no wheezes, rales or rhonci Cardiac is regular rate and rhythm with a normal S1 and S2. There are no murmurs, rubs, or bruits.  Abdomen is soft with good bowel sounds, there is no palpable mass. There is no palpable hepatosplenomegaly. There is no palpable fluid wave.  Musculoskeletal no tenderness of the spine, ribs, or hips.  Extremities there are no clubbing, cyanosis, or edema.  Skin no petechia, purpura or ecchymosis Neurologic is nonfocal.  Laboratory Data: White count 6.9, hemoglobin 13.8, hematocrit 39.4, platelets 139,000  Current Outpatient Prescriptions on File Prior to Visit  Medication Sig Dispense Refill  . acetaminophen (TYLENOL) 325 MG tablet Take 0.5 tablets (162.5 mg total) by mouth every 6 (six) hours as needed. For pain  30 tablet  1  . amiodarone (PACERONE) 200 MG tablet Take 0.5 tablets (100 mg total) by mouth daily.  30 tablet  0  . aspirin EC 81 MG tablet Take 1 tablet (81 mg total) by mouth daily.  31 tablet  0  . carvedilol (COREG) 6.25 MG tablet Take 1 tablet (6.25 mg total) by mouth 2 (two) times daily with a meal.  60 tablet  0  . ciprofloxacin-dexamethasone (CIPRODEX)  otic suspension Place 4 drops into both ears 2 (two) times daily as needed. For otitis media   7.5 mL  1  . diphenhydrAMINE-zinc acetate (BENADRYL) cream Apply 1 application topically 2 (two) times daily as needed. For itching  28.4 g  1  . docusate sodium (COLACE) 100 MG capsule Take 1 capsule (100 mg total) by mouth 2 (two) times daily as needed. For constipation  30 capsule  0  . donepezil (ARICEPT) 10 MG tablet Take 1 tablet (10 mg total) by mouth at bedtime.  30 tablet  0  . ferrous sulfate 325 (65 FE) MG tablet Take 1 tablet (325 mg total) by mouth 2 (two) times daily.  60 tablet  0  . fish oil-omega-3 fatty acids 1000 MG capsule Take 1 capsule (1 g total) by mouth daily.  30 capsule  1  . furosemide (LASIX) 40 MG tablet Take 1 tablet (40 mg total) by mouth 2 (two) times daily. 4 tabs in the am, 2 tabs at bedtime  60 tablet  0  . gabapentin (NEURONTIN) 600 MG tablet Take 0.5-1 tablets (300-600 mg total) by mouth 2 (two) times daily. Take 1/2 tab in the morning and 1 tab at bedtime  45 tablet  0  . insulin glargine (LANTUS) 100 UNIT/ML injection Inject 24 Units into the skin daily.  10 mL  0  . metolazone (ZAROXOLYN) 2.5 MG tablet Take 1 tablet (2.5 mg total) by mouth once a week.  4 tablet  0  . Multiple Vitamin (MULTIVITAMIN) tablet Take 1 tablet by mouth daily.        Marland Kitchen PARoxetine (PAXIL) 40 MG tablet Take 1 tablet (40 mg total) by mouth every morning.  30 tablet  1  . polyethylene glycol (MIRALAX / GLYCOLAX) packet Take 17 g by mouth daily as needed. For constipation      . potassium chloride SA (K-DUR,KLOR-CON) 20 MEQ tablet Take 1 tablet (20 mEq total) by mouth 3 (three) times daily.  90 tablet  0  . Sennosides (SENNA) 15 MG TABS Take 1 tablet (15 mg total) by mouth daily as needed (constipation).  120 each    . simvastatin (ZOCOR) 10 MG tablet Take 1 tablet (10 mg total) by mouth at bedtime.  30 tablet  0  . spironolactone (ALDACTONE) 12.5 mg TABS Take 0.5 tablets (12.5 mg total) by mouth 2 (two) times daily.  60 tablet  0  . sulfamethoxazole-trimethoprim (BACTRIM DS,SEPTRA DS)  800-160 MG per tablet Take 1 tablet by mouth 2 (two) times daily.  16 tablet  0  . triamcinolone (KENALOG) 0.1 % paste Place 1 application onto teeth 4 (four) times daily.       No current facility-administered medications on file prior to visit.   Assessment/Plan: This is a pleasant, 79 year old, white female, with the following issues:  #1.  Anemia of renal insufficiency.  She probably has anemia of polypharmacy.  She remains on a slew of medications.  Her blood looks good.  Today.  She will not require an Aranesp injection.  #2.  Followup.  We will plan on follow back up with her in 3 months, but before then should there be questions or concerns.

## 2013-01-03 ENCOUNTER — Other Ambulatory Visit: Payer: Self-pay | Admitting: Family Medicine

## 2013-02-02 ENCOUNTER — Encounter (HOSPITAL_COMMUNITY): Payer: Self-pay | Admitting: Emergency Medicine

## 2013-02-02 ENCOUNTER — Emergency Department (HOSPITAL_COMMUNITY): Payer: Medicare Other

## 2013-02-02 ENCOUNTER — Inpatient Hospital Stay (HOSPITAL_COMMUNITY)
Admission: EM | Admit: 2013-02-02 | Discharge: 2013-02-09 | DRG: 871 | Disposition: A | Discharge status: Home Health | Payer: Medicare Other | Attending: Internal Medicine | Admitting: Internal Medicine

## 2013-02-02 ENCOUNTER — Other Ambulatory Visit: Payer: Self-pay

## 2013-02-02 DIAGNOSIS — Z954 Presence of other heart-valve replacement: Secondary | ICD-10-CM

## 2013-02-02 DIAGNOSIS — I2489 Other forms of acute ischemic heart disease: Secondary | ICD-10-CM | POA: Diagnosis present

## 2013-02-02 DIAGNOSIS — N039 Chronic nephritic syndrome with unspecified morphologic changes: Secondary | ICD-10-CM

## 2013-02-02 DIAGNOSIS — I4891 Unspecified atrial fibrillation: Secondary | ICD-10-CM

## 2013-02-02 DIAGNOSIS — Z9119 Patient's noncompliance with other medical treatment and regimen: Secondary | ICD-10-CM

## 2013-02-02 DIAGNOSIS — I129 Hypertensive chronic kidney disease with stage 1 through stage 4 chronic kidney disease, or unspecified chronic kidney disease: Secondary | ICD-10-CM | POA: Diagnosis present

## 2013-02-02 DIAGNOSIS — M6281 Muscle weakness (generalized): Secondary | ICD-10-CM

## 2013-02-02 DIAGNOSIS — K089 Disorder of teeth and supporting structures, unspecified: Secondary | ICD-10-CM

## 2013-02-02 DIAGNOSIS — Z951 Presence of aortocoronary bypass graft: Secondary | ICD-10-CM

## 2013-02-02 DIAGNOSIS — I4892 Unspecified atrial flutter: Secondary | ICD-10-CM | POA: Diagnosis present

## 2013-02-02 DIAGNOSIS — B9689 Other specified bacterial agents as the cause of diseases classified elsewhere: Secondary | ICD-10-CM | POA: Diagnosis present

## 2013-02-02 DIAGNOSIS — I5033 Acute on chronic diastolic (congestive) heart failure: Secondary | ICD-10-CM | POA: Diagnosis present

## 2013-02-02 DIAGNOSIS — D631 Anemia in chronic kidney disease: Secondary | ICD-10-CM

## 2013-02-02 DIAGNOSIS — F0280 Dementia in other diseases classified elsewhere without behavioral disturbance: Secondary | ICD-10-CM | POA: Diagnosis present

## 2013-02-02 DIAGNOSIS — L659 Nonscarring hair loss, unspecified: Secondary | ICD-10-CM | POA: Diagnosis present

## 2013-02-02 DIAGNOSIS — G4733 Obstructive sleep apnea (adult) (pediatric): Secondary | ICD-10-CM | POA: Diagnosis present

## 2013-02-02 DIAGNOSIS — E119 Type 2 diabetes mellitus without complications: Secondary | ICD-10-CM | POA: Diagnosis present

## 2013-02-02 DIAGNOSIS — R7881 Bacteremia: Principal | ICD-10-CM | POA: Diagnosis present

## 2013-02-02 DIAGNOSIS — I5031 Acute diastolic (congestive) heart failure: Secondary | ICD-10-CM

## 2013-02-02 DIAGNOSIS — M810 Age-related osteoporosis without current pathological fracture: Secondary | ICD-10-CM | POA: Diagnosis present

## 2013-02-02 DIAGNOSIS — L03119 Cellulitis of unspecified part of limb: Secondary | ICD-10-CM

## 2013-02-02 DIAGNOSIS — L02419 Cutaneous abscess of limb, unspecified: Secondary | ICD-10-CM | POA: Diagnosis present

## 2013-02-02 DIAGNOSIS — I2510 Atherosclerotic heart disease of native coronary artery without angina pectoris: Secondary | ICD-10-CM | POA: Diagnosis present

## 2013-02-02 DIAGNOSIS — N179 Acute kidney failure, unspecified: Secondary | ICD-10-CM | POA: Diagnosis present

## 2013-02-02 DIAGNOSIS — D649 Anemia, unspecified: Secondary | ICD-10-CM | POA: Diagnosis not present

## 2013-02-02 DIAGNOSIS — I248 Other forms of acute ischemic heart disease: Secondary | ICD-10-CM | POA: Diagnosis present

## 2013-02-02 DIAGNOSIS — I447 Left bundle-branch block, unspecified: Secondary | ICD-10-CM | POA: Diagnosis present

## 2013-02-02 DIAGNOSIS — Z91199 Patient's noncompliance with other medical treatment and regimen due to unspecified reason: Secondary | ICD-10-CM

## 2013-02-02 DIAGNOSIS — R7989 Other specified abnormal findings of blood chemistry: Secondary | ICD-10-CM | POA: Diagnosis present

## 2013-02-02 DIAGNOSIS — I10 Essential (primary) hypertension: Secondary | ICD-10-CM | POA: Diagnosis present

## 2013-02-02 DIAGNOSIS — Z96659 Presence of unspecified artificial knee joint: Secondary | ICD-10-CM

## 2013-02-02 DIAGNOSIS — G309 Alzheimer's disease, unspecified: Secondary | ICD-10-CM | POA: Diagnosis present

## 2013-02-02 DIAGNOSIS — E785 Hyperlipidemia, unspecified: Secondary | ICD-10-CM | POA: Diagnosis present

## 2013-02-02 DIAGNOSIS — Z952 Presence of prosthetic heart valve: Secondary | ICD-10-CM

## 2013-02-02 DIAGNOSIS — R5381 Other malaise: Secondary | ICD-10-CM | POA: Diagnosis present

## 2013-02-02 DIAGNOSIS — N183 Chronic kidney disease, stage 3 unspecified: Secondary | ICD-10-CM | POA: Diagnosis present

## 2013-02-02 DIAGNOSIS — B951 Streptococcus, group B, as the cause of diseases classified elsewhere: Secondary | ICD-10-CM | POA: Diagnosis present

## 2013-02-02 DIAGNOSIS — Z79899 Other long term (current) drug therapy: Secondary | ICD-10-CM

## 2013-02-02 DIAGNOSIS — I509 Heart failure, unspecified: Secondary | ICD-10-CM | POA: Diagnosis present

## 2013-02-02 DIAGNOSIS — D72829 Elevated white blood cell count, unspecified: Secondary | ICD-10-CM | POA: Diagnosis present

## 2013-02-02 LAB — TROPONIN I: Troponin I: 0.51 ng/mL (ref <0.30)

## 2013-02-02 LAB — COMPREHENSIVE METABOLIC PANEL
ALT: 33 U/L (ref 0–35)
Albumin: 4.2 g/dL (ref 3.5–5.2)
Alkaline Phosphatase: 158 U/L — ABNORMAL HIGH (ref 39–117)
BUN: 29 mg/dL — ABNORMAL HIGH (ref 6–23)
Calcium: 8.7 mg/dL (ref 8.4–10.5)
GFR calc Af Amer: 46 mL/min — ABNORMAL LOW (ref >90)
Glucose, Bld: 221 mg/dL — ABNORMAL HIGH (ref 70–99)
Glucose, Bld: 281 mg/dL — ABNORMAL HIGH (ref 70–99)
Potassium: 4.1 mEq/L (ref 3.5–5.1)
Sodium: 135 mEq/L (ref 135–145)
Total Protein: 8.6 g/dL — ABNORMAL HIGH (ref 6.0–8.3)
Total Protein: 7.1 g/dL (ref 6.0–8.3)

## 2013-02-02 LAB — CBC
Hemoglobin: 15.7 g/dL — ABNORMAL HIGH (ref 12.0–15.0)
MCHC: 35 g/dL (ref 30.0–36.0)
RDW: 14.3 % (ref 11.5–15.5)
WBC: 20.6 10*3/uL — ABNORMAL HIGH (ref 4.0–10.5)

## 2013-02-02 LAB — POCT I-STAT TROPONIN I: Troponin i, poc: 0.04 ng/mL (ref 0.00–0.08)

## 2013-02-02 LAB — PRO B NATRIURETIC PEPTIDE: Pro B Natriuretic peptide (BNP): 4802 pg/mL — ABNORMAL HIGH (ref 0–450)

## 2013-02-02 MED ORDER — PAROXETINE HCL 20 MG PO TABS
40.0000 mg | ORAL_TABLET | Freq: Every day | ORAL | Status: DC
  Administered 2013-02-03 – 2013-02-09 (×7): 40 mg via ORAL
  Filled 2013-02-02 (×7): qty 2

## 2013-02-02 MED ORDER — POTASSIUM CHLORIDE CRYS ER 20 MEQ PO TBCR
20.0000 meq | EXTENDED_RELEASE_TABLET | Freq: Three times a day (TID) | ORAL | Status: DC
  Administered 2013-02-02 – 2013-02-09 (×21): 20 meq via ORAL
  Filled 2013-02-02 (×22): qty 1

## 2013-02-02 MED ORDER — DILTIAZEM HCL 25 MG/5ML IV SOLN
20.0000 mg | Freq: Once | INTRAVENOUS | Status: AC
  Administered 2013-02-02: 20 mg via INTRAVENOUS

## 2013-02-02 MED ORDER — ASPIRIN EC 81 MG PO TBEC
81.0000 mg | DELAYED_RELEASE_TABLET | Freq: Every day | ORAL | Status: DC
  Administered 2013-02-03 – 2013-02-09 (×7): 81 mg via ORAL
  Filled 2013-02-02 (×7): qty 1

## 2013-02-02 MED ORDER — SODIUM CHLORIDE 0.9 % IV SOLN
1000.0000 mL | INTRAVENOUS | Status: DC
  Administered 2013-02-02 – 2013-02-09 (×2): 1000 mL via INTRAVENOUS

## 2013-02-02 MED ORDER — NITROGLYCERIN 0.4 MG SL SUBL: 0.4000 mg | SUBLINGUAL_TABLET | SUBLINGUAL | Status: DC | PRN

## 2013-02-02 MED ORDER — HEPARIN (PORCINE) IN NACL 100-0.45 UNIT/ML-% IJ SOLN
1800.0000 [IU]/h | INTRAMUSCULAR | Status: DC
  Administered 2013-02-02 – 2013-02-03 (×2): 1250 [IU]/h via INTRAVENOUS
  Administered 2013-02-04: 1700 [IU]/h via INTRAVENOUS
  Administered 2013-02-04: 1250 [IU]/h via INTRAVENOUS
  Administered 2013-02-05: 1800 [IU]/h via INTRAVENOUS
  Administered 2013-02-05: 1700 [IU]/h via INTRAVENOUS
  Filled 2013-02-02 (×7): qty 250

## 2013-02-02 MED ORDER — DILTIAZEM HCL 100 MG IV SOLR
5.0000 mg/h | Freq: Once | INTRAVENOUS | Status: AC
  Administered 2013-02-02: 10 mg/h via INTRAVENOUS

## 2013-02-02 MED ORDER — INSULIN GLARGINE 100 UNIT/ML ~~LOC~~ SOLN
24.0000 [IU] | Freq: Every day | SUBCUTANEOUS | Status: DC
  Administered 2013-02-02: 24 [IU] via SUBCUTANEOUS
  Filled 2013-02-02 (×2): qty 0.24

## 2013-02-02 MED ORDER — HEPARIN BOLUS VIA INFUSION
4000.0000 [IU] | Freq: Once | INTRAVENOUS | Status: AC
  Administered 2013-02-02: 4000 [IU] via INTRAVENOUS

## 2013-02-02 MED ORDER — FUROSEMIDE 80 MG PO TABS: 80.0000 mg | ORAL_TABLET | Freq: Two times a day (BID) | ORAL | Status: DC

## 2013-02-02 MED ORDER — FUROSEMIDE 80 MG PO TABS
160.0000 mg | ORAL_TABLET | Freq: Every day | ORAL | Status: DC
  Administered 2013-02-02 – 2013-02-04 (×3): 160 mg via ORAL
  Filled 2013-02-02 (×3): qty 2

## 2013-02-02 MED ORDER — ASPIRIN 81 MG PO CHEW
324.0000 mg | CHEWABLE_TABLET | ORAL | Status: AC
Start: 2013-02-02 — End: 2013-02-02
  Administered 2013-02-02: 324 mg via ORAL
  Filled 2013-02-02: qty 4

## 2013-02-02 MED ORDER — ASPIRIN EC 81 MG PO TBEC: 81.0000 mg | DELAYED_RELEASE_TABLET | Freq: Every day | ORAL | Status: DC

## 2013-02-02 MED ORDER — ADENOSINE 6 MG/2ML IV SOLN
12.0000 mg | Freq: Once | INTRAVENOUS | Status: AC
  Administered 2013-02-02: 12 mg via INTRAVENOUS

## 2013-02-02 MED ORDER — PAROXETINE HCL 20 MG PO TABS: 40.0000 mg | ORAL_TABLET | ORAL | Status: DC

## 2013-02-02 MED ORDER — ASPIRIN 300 MG RE SUPP
300.0000 mg | RECTAL | Status: AC
  Filled 2013-02-02: qty 1

## 2013-02-02 MED ORDER — GABAPENTIN 300 MG PO CAPS
300.0000 mg | ORAL_CAPSULE | Freq: Every morning | ORAL | Status: DC
  Administered 2013-02-03 – 2013-02-09 (×7): 300 mg via ORAL
  Filled 2013-02-02 (×7): qty 1

## 2013-02-02 MED ORDER — SIMVASTATIN 10 MG PO TABS
10.0000 mg | ORAL_TABLET | Freq: Every day | ORAL | Status: DC
  Administered 2013-02-02 – 2013-02-07 (×6): 10 mg via ORAL
  Filled 2013-02-02 (×7): qty 1

## 2013-02-02 MED ORDER — FERROUS SULFATE 325 (65 FE) MG PO TABS
325.0000 mg | ORAL_TABLET | Freq: Two times a day (BID) | ORAL | Status: DC
  Administered 2013-02-02 – 2013-02-08 (×12): 325 mg via ORAL
  Administered 2013-02-08: 12:00:00 via ORAL
  Administered 2013-02-09: 325 mg via ORAL
  Filled 2013-02-02 (×16): qty 1

## 2013-02-02 MED ORDER — ACETAMINOPHEN 325 MG PO TABS
650.0000 mg | ORAL_TABLET | ORAL | Status: DC | PRN
  Filled 2013-02-02: qty 2

## 2013-02-02 MED ORDER — FUROSEMIDE 80 MG PO TABS
80.0000 mg | ORAL_TABLET | Freq: Every day | ORAL | Status: DC
  Administered 2013-02-03: 80 mg via ORAL
  Filled 2013-02-02 (×2): qty 1

## 2013-02-02 MED ORDER — GABAPENTIN 300 MG PO CAPS
600.0000 mg | ORAL_CAPSULE | Freq: Every day | ORAL | Status: DC
  Administered 2013-02-02 – 2013-02-08 (×7): 600 mg via ORAL
  Filled 2013-02-02 (×8): qty 2

## 2013-02-02 MED ORDER — ACETAMINOPHEN 325 MG PO TABS: 650.0000 mg | ORAL_TABLET | ORAL | Status: DC | PRN

## 2013-02-02 MED ORDER — SODIUM CHLORIDE 0.9 % IJ SOLN
3.0000 mL | Freq: Two times a day (BID) | INTRAMUSCULAR | Status: DC
  Administered 2013-02-02 – 2013-02-08 (×12): 3 mL via INTRAVENOUS

## 2013-02-02 MED ORDER — AMIODARONE HCL 200 MG PO TABS
400.0000 mg | ORAL_TABLET | Freq: Two times a day (BID) | ORAL | Status: DC
  Administered 2013-02-02 – 2013-02-05 (×6): 400 mg via ORAL
  Filled 2013-02-02 (×7): qty 2

## 2013-02-02 MED ORDER — DOCUSATE SODIUM 100 MG PO CAPS
100.0000 mg | ORAL_CAPSULE | Freq: Two times a day (BID) | ORAL | Status: DC | PRN
  Filled 2013-02-02: qty 1

## 2013-02-02 MED ORDER — SPIRONOLACTONE 12.5 MG HALF TABLET
12.5000 mg | ORAL_TABLET | Freq: Two times a day (BID) | ORAL | Status: DC
  Administered 2013-02-02 – 2013-02-09 (×15): 12.5 mg via ORAL
  Filled 2013-02-02 (×17): qty 1

## 2013-02-02 MED ORDER — ONDANSETRON HCL 4 MG/2ML IJ SOLN: 4.0000 mg | Freq: Four times a day (QID) | INTRAMUSCULAR | Status: DC | PRN

## 2013-02-02 MED ORDER — POLYETHYLENE GLYCOL 3350 17 G PO PACK
17.0000 g | PACK | Freq: Every day | ORAL | Status: DC | PRN
  Filled 2013-02-02: qty 1

## 2013-02-02 MED ORDER — SODIUM CHLORIDE 0.9 % IJ SOLN
3.0000 mL | INTRAMUSCULAR | Status: DC | PRN
  Administered 2013-02-06 – 2013-02-09 (×3): 3 mL via INTRAVENOUS

## 2013-02-02 MED ORDER — GABAPENTIN 600 MG PO TABS: 300.0000 mg | ORAL_TABLET | Freq: Two times a day (BID) | ORAL | Status: DC

## 2013-02-02 MED ORDER — DONEPEZIL HCL 10 MG PO TABS
10.0000 mg | ORAL_TABLET | Freq: Every day | ORAL | Status: DC
  Administered 2013-02-02 – 2013-02-08 (×7): 10 mg via ORAL
  Filled 2013-02-02 (×8): qty 1

## 2013-02-02 MED ORDER — ADENOSINE 6 MG/2ML IV SOLN
INTRAVENOUS | Status: AC
  Filled 2013-02-02: qty 6

## 2013-02-02 MED ORDER — CARVEDILOL 6.25 MG PO TABS
6.2500 mg | ORAL_TABLET | Freq: Two times a day (BID) | ORAL | Status: DC
  Administered 2013-02-02 – 2013-02-05 (×6): 6.25 mg via ORAL
  Filled 2013-02-02 (×8): qty 1

## 2013-02-02 MED ORDER — DILTIAZEM HCL 60 MG PO TABS
60.0000 mg | ORAL_TABLET | Freq: Four times a day (QID) | ORAL | Status: DC
  Administered 2013-02-02 – 2013-02-08 (×21): 60 mg via ORAL
  Filled 2013-02-02 (×26): qty 1

## 2013-02-02 MED ORDER — ADENOSINE 6 MG/2ML IV SOLN
6.0000 mg | Freq: Once | INTRAVENOUS | Status: AC
  Administered 2013-02-02: 6 mg via INTRAVENOUS

## 2013-02-02 MED ORDER — SODIUM CHLORIDE 0.9 % IV SOLN: 250.0000 mL | INTRAVENOUS | Status: DC | PRN

## 2013-02-02 NOTE — ED Notes (Signed)
Cardiology at bedside.

## 2013-02-02 NOTE — ED Provider Notes (Signed)
History     CSN: [id number]  Arrival date & time 02/02/13  1607   First MD Initiated Contact with Patient 02/02/13 1618      No chief complaint on file.   (Consider location/radiation/quality/duration/timing/severity/associated sxs/prior treatment) Patient is a 79 y.o. female presenting with palpitations. The history is provided by medical records and the EMS personnel. No language interpreter was used.  Palpitations  This is a new problem. Episode onset: And felt weak, felt that her heart was racing. EMS was called, and her heart rate was about 2:15, with a wide complex on cardiac monitor. The problem occurs constantly. The problem has not changed since onset.On average, each episode lasts 45 minutes. Associated symptoms include diaphoresis, near-syncope and weakness. Pertinent negatives include no chest pressure, no vomiting and no shortness of breath. Treatments tried: IV amiodarone. The treatment provided no relief. Risk factors: Medical records shows a prior history of paroxysmal atrial fibrillation. Her past medical history is significant for valve disorder (prior aortic valve replacement.).    Past Medical History  Diagnosis Date  . Coronary artery disease 09/04/08    single vessel CABG at time of AVR   . Aortic stenosis 09/04/08    s/p AVR by Dr Tyrone Sage (tissue valve)  . Paroxysmal atrial fibrillation   . LBBB (left bundle branch block)   . CHF (congestive heart failure)     diastolic  . Sleep apnea   . Alzheimer's dementia   . Diabetes mellitus     type 2  . Hypertension   . Chronic anemia   . Osteoporosis   . Chronic renal failure   . Morbid obesity with BMI of 50.0-59.9, adult 03/09/2012    Past Surgical History  Procedure Laterality Date  . Coronary artery bypass graft  09/04/08    single vessel at time of AVR  . Aortic valve replacement  12/09    tissue valve by Dr Tyrone Sage  . Cholecystectomy  1995  . Cataract extraction    . Replacement total knee bilateral       L 2000, R 1997  . Appendectomy    . Closed reduction femoral shaft fracture  2007    R intramedullary rod    Family History  Problem Relation Age of Onset  . Other Father 63    deceased. multiple MI   . Lung cancer Mother 93    died    History  Substance Use Topics  . Smoking status: Never Smoker   . Smokeless tobacco: Never Used  . Alcohol Use: No    OB History   Grav Para Term Preterm Abortions TAB SAB Ect Mult Living                  Review of Systems  Unable to perform ROS: Unstable vital signs  Constitutional: Positive for diaphoresis.  Respiratory: Negative for shortness of breath.   Cardiovascular: Positive for palpitations and near-syncope.  Gastrointestinal: Negative for vomiting.  Neurological: Positive for weakness.    Allergies  Morphine and related and Celecoxib  Home Medications   Current Outpatient Rx  Name  Route  Sig  Dispense  Refill  . acetaminophen (TYLENOL) 325 MG tablet   Oral   Take 162.5 mg by mouth as needed. For pain         . amiodarone (PACERONE) 200 MG tablet   Oral   Take 0.5 tablets (100 mg total) by mouth daily.   30 tablet   0   . aspirin EC  81 MG tablet   Oral   Take 1 tablet (81 mg total) by mouth daily.   31 tablet   0   . carvedilol (COREG) 6.25 MG tablet   Oral   Take 1 tablet (6.25 mg total) by mouth 2 (two) times daily with a meal.   60 tablet   0   . ciprofloxacin-dexamethasone (CIPRODEX) otic suspension   Both Ears   Place 4 drops into both ears 2 (two) times daily as needed. For otitis media   7.5 mL   1   . diphenhydrAMINE-zinc acetate (BENADRYL) cream   Topical   Apply 1 application topically 2 (two) times daily as needed. For itching   28.4 g   1   . docusate sodium (COLACE) 100 MG capsule   Oral   Take 1 capsule (100 mg total) by mouth 2 (two) times daily as needed. For constipation   30 capsule   0   . donepezil (ARICEPT) 10 MG tablet   Oral   Take 1 tablet (10 mg total) by mouth  at bedtime.   30 tablet   0   . ferrous sulfate 325 (65 FE) MG tablet   Oral   Take 1 tablet (325 mg total) by mouth 2 (two) times daily.   60 tablet   0   . fish oil-omega-3 fatty acids 1000 MG capsule   Oral   Take 1 capsule (1 g total) by mouth daily.   30 capsule   1   . furosemide (LASIX) 40 MG tablet   Oral   Take 1 tablet (40 mg total) by mouth 2 (two) times daily. 4 tabs in the am, 2 tabs at bedtime   60 tablet   0   . gabapentin (NEURONTIN) 600 MG tablet   Oral   Take 0.5-1 tablets (300-600 mg total) by mouth 2 (two) times daily. Take 1/2 tab in the morning and 1 tab at bedtime   45 tablet   0   . insulin glargine (LANTUS) 100 UNIT/ML injection   Subcutaneous   Inject 24 Units into the skin daily.   10 mL   0   . metolazone (ZAROXOLYN) 2.5 MG tablet   Oral   Take 1 tablet (2.5 mg total) by mouth once a week.   4 tablet   0   . Multiple Vitamin (MULTIVITAMIN) tablet   Oral   Take 1 tablet by mouth daily.           Marland Kitchen PARoxetine (PAXIL) 40 MG tablet   Oral   Take 1 tablet (40 mg total) by mouth every morning.   30 tablet   1   . polyethylene glycol (MIRALAX / GLYCOLAX) packet   Oral   Take 17 g by mouth daily as needed. For constipation         . potassium chloride SA (K-DUR,KLOR-CON) 20 MEQ tablet   Oral   Take 1 tablet (20 mEq total) by mouth 3 (three) times daily.   90 tablet   0   . Sennosides (SENNA) 15 MG TABS   Oral   Take 1 tablet (15 mg total) by mouth daily as needed (constipation).   120 each      . simvastatin (ZOCOR) 10 MG tablet   Oral   Take 1 tablet (10 mg total) by mouth at bedtime.   30 tablet   0   . spironolactone (ALDACTONE) 12.5 mg TABS   Oral   Take 0.5 tablets (12.5  mg total) by mouth 2 (two) times daily.   60 tablet   0   . sulfamethoxazole-trimethoprim (BACTRIM DS,SEPTRA DS) 800-160 MG per tablet   Oral   Take 1 tablet by mouth 2 (two) times daily.   16 tablet   0   . triamcinolone (KENALOG) 0.1 %  paste   dental   Place 1 application onto teeth 4 (four) times daily.         Marland Kitchen UNKNOWN TO PATIENT      12-25-12 medication list print out given to patient, for her daughter to review and call me with changes.           There were no vitals taken for this visit.  Physical Exam  Nursing note and vitals reviewed. Constitutional: She is oriented to person, place, and time.  Obese elderly woman, completely bald, awake and alert, monitor showing rate appx 215 with wide comlexes.  HENT:  Head: Normocephalic and atraumatic.  Right Ear: External ear normal.  Left Ear: External ear normal.  Mouth/Throat: Oropharynx is clear and moist.  Eyes: Conjunctivae and EOM are normal. Pupils are equal, round, and reactive to light.  Neck: Normal range of motion. Neck supple.  Cardiovascular:  Rate 215, sounds fairly regular.  Pulmonary/Chest: Effort normal and breath sounds normal.  Abdominal: Soft. Bowel sounds are normal.  Musculoskeletal: Normal range of motion. She exhibits no edema and no tenderness.  Neurological: She is alert and oriented to person, place, and time.  No sensory or motor deficit.  Skin: Skin is warm and dry.  Psychiatric: She has a normal mood and affect. Her behavior is normal.    ED Course  CRITICAL CARE Performed by: Osvaldo Human Authorized by: Osvaldo Human Total critical care time: 30 minutes Critical care was necessary to treat or prevent imminent or life-threatening deterioration of the following conditions: 79 yo lady brought in by EMS with wide complex tachycardia which had not responded to amiodarone given by EMS, which did not respond to adenosine, but which did respond to IV Cardizem.  Admitted to CCU by Dr. Anne Fu, her cardiologist. Critical care was time spent personally by me on the following activities: discussions with consultants, evaluation of patient's response to treatment, examination of patient, obtaining history from patient or  surrogate, ordering and performing treatments and interventions, ordering and review of laboratory studies, ordering and review of radiographic studies, re-evaluation of patient's condition and review of old charts.   (including critical care time)  Labs Reviewed  CBC  COMPREHENSIVE METABOLIC PANEL  PROTIME-INR  APTT  URINALYSIS, ROUTINE W REFLEX MICROSCOPIC   4:37 PM Pt seen STAT upon arrival.  EMS strips and cardiac monitor showed wide complex tachycardia with rate appx 115.  She had received.amiodarone 150 mg IV by EMS without relief.  Initial BP 106 systolic.  She was awake and alert.  Given adenosine 6mg  and 12mg  IV with slowing to appx 160.  Given Cardizem 20 mg IV, with slowing to 107 and BP up to 130 systolic.  Call To [facility], spoke to Verdis Prime, M.D., who will ask Dr. Dione Housekeeper, her cardiologist, to see and admit her.    5:04 PM  Rate holding at 108.    Date: 02/02/2013  Rate: 107  Rhythm: normal sinus rhythm  QRS Axis: right  Intervals: QT prolonged QRS:  Poor r wave progression in lateral precordial leads suggests old lateral wall myocardial infarction  ST/T Wave abnormalities: ST elevations anteriorly  Conduction Disutrbances:nonspecific intraventricular conduction  delay  Narrative Interpretation: Abnormal EKG  Old EKG Reviewed: unchanged--ST elevations in the anterior leads are the same as on tracing 11/22013.  5:13 PM Case discussed with Dr. Anne Fu, who is admitting pt.    1. Atrial fibrillation with rapid ventricular response             Carleene Cooper III, MD 02/02/13 [phone]

## 2013-02-02 NOTE — ED Notes (Signed)
Pt arrived to room, placed on Zoll pads, Dr. Ignacia Palma at bedside. Pt given 6 mg adenoside at 1624 by Karolee Stamps, RN to Right [facility] pt showing VTACH on monitor at rate of 159. At 1625 HR remained at 160 BP -107/52. Pt given 12 mg adenosine at 1626 HR decreased to 134 at 1627 and increased to 160 again. BP - 120/70 at that time. Diltiazem 20mg  given at 1630. HR 108 at 1634. BP - 150/113 at that time. No LOC at any time.

## 2013-02-02 NOTE — ED Notes (Signed)
DR. Ignacia Palma at bedside.

## 2013-02-02 NOTE — H&P (Addendum)
Admit date: 02/02/2013 Primary Physician  Dr. Clent Ridges Primary Cardiologist  Dr. Anne Fu  CC: AFLUTTER one to one conduction (felt heart pounding), 216bpm.   HPI: 79 year old female with coronary artery disease status post single-vessel bypass, aortic valve replacement bioprosthetic, underlying left bundle branch block, paroxysmal atrial flutter on chronic low-dose amiodarone, issues with hypokalemia, medical noncompliance, diastolic heart failure, morbid obesity, diabetes who earlier today woke up and did not feel well. She felt as though her heart was pounding and she was diaphoretic. She denied any chest pain, dizziness, syncope, shortness of breath however when I asked her. No recent fevers, chills, rashes. Difficult to obtain a clear history. Nonetheless, her daughter called EMS and they found her to be with wide complex tachycardia heart rate of 216 bpm. They were concerned about ventricular tachycardia. They administered IV amiodarone x1 150 mg. She then was administered adenosine 6 mg then 12 mg here in the emergency department without any change. Finally, IV diltiazem was administered and her heart rate decreased to 108 beats per minute, exactly half of 216 beats per minute. She appears to be in atrial flutter.  She had been on Coumadin in the past but this was discontinued because of multiple falls/injuries. She is normally taking 100 mg of amiodarone. She will occasionally take metolazone as well as furosemide.  She has had significant bradycardia in the past.     PMH:   Past Medical History  Diagnosis Date  . Coronary artery disease 09/04/08    single vessel CABG at time of AVR   . Aortic stenosis 09/04/08    s/p AVR by Dr Tyrone Sage (tissue valve)  . Paroxysmal atrial fibrillation   . LBBB (left bundle branch block)   . CHF (congestive heart failure)     diastolic  . Sleep apnea   . Alzheimer's dementia   . Diabetes mellitus     type 2  . Hypertension   . Chronic anemia   .  Osteoporosis   . Chronic renal failure   . Morbid obesity with BMI of 50.0-59.9, adult 03/09/2012    PSH:   Past Surgical History  Procedure Laterality Date  . Coronary artery bypass graft  09/04/08    single vessel at time of AVR  . Aortic valve replacement  12/09    tissue valve by Dr Tyrone Sage  . Cholecystectomy  1995  . Cataract extraction    . Replacement total knee bilateral      L 2000, R 1997  . Appendectomy    . Closed reduction femoral shaft fracture  2007    R intramedullary rod   Allergies:  Morphine and related and Celecoxib Prior to Admit Meds:   (Not in a hospital admission) Fam HX:    Family History  Problem Relation Age of Onset  . Other Father 45    deceased. multiple MI   . Lung cancer Mother 4    died   Social HX:    History   Social History  . Marital Status: Widowed    Spouse Name: N/A    Number of Children: N/A  . Years of Education: N/A   Occupational History  . retired     lives with her daughter   Social History Main Topics  . Smoking status: Never Smoker   . Smokeless tobacco: Never Used  . Alcohol Use: No  . Drug Use: No  . Sexually Active: No   Other Topics Concern  . Not on file   Social History  Narrative   Lives in [place] with daughter, [name], who is an Charity fundraiser.     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   Widowed 2003     ROS:  All 11 ROS were addressed and are negative except what is stated in the HPI  Physical Exam: Blood pressure 107/52, pulse 107, temperature 99.7 F (37.6 C), temperature source Oral, resp. rate 18, SpO2 98.00%.    General: Well developed, well nourished, in no acute distress, overweight Head: Eyes PERRLA, No xanthomas.   Normal cephalic and atramatic  Lungs:   Clear bilaterally to auscultation and percussion. Normal respiratory effort. No wheezes, no rales. Heart: Tachycardic, regular rhythm S1 S2 Pulses are 2+ & equal. 1/6 systolic murmur right upper sternal border     No  carotid bruit. No JVD.  No abdominal bruits.  Abdomen: Bowel sounds are positive, abdomen soft and non-tender without masses obese Msk:  Back normal. Normal strength and tone for age. Extremities:   No clubbing, cyanosis. 1+ bilateral lower extremity edema.  DP +1 Neuro: Alert and oriented X 3, non-focal, MAE x 4 GU: Deferred Rectal: Deferred Psych:  Good affect, responds appropriately    Labs:   Lab Results  Component Value Date   WBC 20.6* 02/02/2013   HGB 15.7* 02/02/2013   HCT 44.9 02/02/2013   MCV 92.0 02/02/2013   PLT 184 02/02/2013   No results found for this basename: NA, K, CL, CO2, BUN, CREATININE, CALCIUM, LABALBU, PROT, BILITOT, ALKPHOS, ALT, AST, GLUCOSE,  in the last 168 hours No results found for this basename: PTT   Lab Results  Component Value Date   INR 1.18 08/01/2012   INR 1.23 07/28/2012   INR 1.17 04/11/2012   Lab Results  Component Value Date   CKTOTAL 42 04/12/2012   CKMB 1.4 04/12/2012   TROPONINI <0.30 07/29/2012     Lab Results  Component Value Date   CHOL  Value: 111        ATP III CLASSIFICATION:  <200     mg/dL   Desirable  454-098  mg/dL   Borderline High  >=119    mg/dL   High 14/03/8294   Lab Results  Component Value Date   HDL 42 08/30/2008   Lab Results  Component Value Date   LDLCALC  Value: 54        Total Cholesterol/HDL:CHD Risk Coronary Heart Disease Risk Table                     Men   Women  1/2 Average Risk   3.4   3.3 08/30/2008   Lab Results  Component Value Date   TRIG 76 08/30/2008   Lab Results  Component Value Date   CHOLHDL 2.6 08/30/2008   No results found for this basename: LDLDIRECT      Radiology:  Dg Chest Portable 1 View  02/02/2013  *RADIOLOGY REPORT*  Clinical Data: Shortness of breath and tachycardia.  PORTABLE CHEST - 1 VIEW  Comparison: 07/28/2012 and prior chest radiographs  Findings: Cardiomegaly with pulmonary vascular congestion noted. Mild chronic interstitial opacities are again noted. A defibrillator pad  overlying the left chest is noted. Evidence of previous cardiac surgery/valve replacement noted. There is no evidence of focal airspace disease, suspicious pulmonary nodule/mass, pleural effusion, or pneumothorax. No acute bony abnormalities are identified.  IMPRESSION: Cardiomegaly with pulmonary vascular congestion and possible minimal interstitial pulmonary edema.   Original Report Authenticated By: Harmon Pier,  M.D.    Personally viewed.   EKG:  First EKG from EMS on 15:59, left bundle branch block morphology 216 beats per minute, 1-1 flutter conduction. Current telemetry demonstrates heart rate of 108 beats per minute currently 2-1 flutter conduction. Personally viewed.  Echocardiogram: 7/13-EF 60-65%  ASSESSMENT/PLAN:   79 year old female with paroxysmal atrial flutter, earlier today conducting in a one-to-one fashion with associated diaphoresis, palpitations but no syncope, no chest pain, no shortness of breath. Multiple medical comorbidities.  1. Atrial flutter-I will go ahead and reload by mouth amiodarone. She had been on 100 mg daily secondary to previous bradycardia. I will place her on 400 mg twice a day over the next week then decrease to 200 mg once a day. She also responded well to IV diltiazem and I will transition to by mouth 60 mg every 6 hours. I will continue her carvedilol 6.25 mg twice a day. If she continues to have difficulty, I will have low threshold for consultation to electrophysiology. She may very well become bradycardic as is seen in the past. I will check cardiac biomarkers as well. I will check TSH. Currently she is not showing any signs of overt heart failure. Previously she has not been deemed Coumadin candidate because of multiple severe falls. Continuing with aspirin. While she is here, I will place on IV heparin (in case cardioversion is needed). 2. Diabetes-continue current medications 3. Chronic kidney disease-continue to monitor, avoid NSAIDs 4. Morbid  obesity-encourage weight loss 5. Chronic diastolic dysfunction-medical compliance has been an issue, compliance with fluids has been an issue. Watch for signs of hypokalemia. 6. Coronary artery disease-post single-vessel bypass in the setting of aortic valve replacement. No evidence of angina even with heart rate of 216 beats per minute. 7. Status post aortic valve replacement 8. Leukocytosis, white blood cell count 20,000. No obvious signs of infection, chest x-ray with no evidence of pneumonia. I will check urinalysis. ? Stress reaction. No recent prednisone reported.  I will ask hospitalist teamfor advice. We will monitor closely.  Donato Schultz, MD  02/02/2013  5:00 PM

## 2013-02-02 NOTE — ED Notes (Signed)
hospitalist at bedside

## 2013-02-02 NOTE — Consult Note (Signed)
[facility] Medical Consultation  [name] ZOX:[id number] DOB: 1932-10-07 DOA: 02/02/2013 PCP: [name], MD   Requesting physician: Dr. Donato Schultz Date of consultation: May 9 Reason for consultation:  leukocytosis  Impression/Recommendations Principal Problem:   PAROXYSMAL ATRIAL FIBRILLATION Active Problems:   AODM   HYPERTENSION, CONTROLLED   AORTIC VALVE REPLACEMENT, HX OF   Leukocytosis  Abnormal liver function tests   1. Leukocytosis-patient reports to me a episode of severe rigors at home prior to admission. With this kind of history I am concerned for bacteremia. I'm going to  obtain 2 blood cultures. If patient becomes febrile we will start empiric antibiotics with vancomycin and cefepime. Alternatively the leukocytosis may be reactive to the severe tachycardia. I concur with urine culture. I have reviewed the chest x-ray and I do not see evidence for pneumonia. I have examined the patient skin I do not see evidence for cellulitis. 2. abnormal liver function tests-acute. Could be fatty liver disease. I have ordered an abdominal ultrasound. we'll need a repeat tomorrow to follow trend.  3. diabetes mellitus type 2-for now we'll continue home dose insulin.   I will followup again tomorrow. Please contact me if I can be of assistance in the meanwhile. Thank you for this consultation.  Chief Complaint:  Chief Complaint  Patient presents with  . Tachycardia     HPI:  79 year old patient with history of atrial flutter present to the emergency room with tachycardia. She was seen by cardiologist and received adenosine and diltiazem and she is currently rate controlled and  hemodinamically stable. Incidentally she was found to have leukocytosis and we were called for consultation. Patient denies cough, diarrhea, abdominal pain, dysuria. She reports episode of rigors at home prior to admission  Review of Systems:  As per history of present illness   Past Medical  History  Diagnosis Date  . Coronary artery disease 09/04/08    single vessel CABG at time of AVR   . Aortic stenosis 09/04/08    s/p AVR by Dr Tyrone Sage (tissue valve)  . Paroxysmal atrial fibrillation   . LBBB (left bundle branch block)   . CHF (congestive heart failure)     diastolic  . Sleep apnea   . Alzheimer's dementia   . Diabetes mellitus     type 2  . Hypertension   . Chronic anemia   . Osteoporosis   . Chronic renal failure   . Morbid obesity with BMI of 50.0-59.9, adult 03/09/2012   Past Surgical History  Procedure Laterality Date  . Coronary artery bypass graft  09/04/08    single vessel at time of AVR  . Aortic valve replacement  12/09    tissue valve by Dr Tyrone Sage  . Cholecystectomy  1995  . Cataract extraction    . Replacement total knee bilateral      L 2000, R 1997  . Appendectomy    . Closed reduction femoral shaft fracture  2007    R intramedullary rod   Social History:  reports that she has never smoked. She has never used smokeless tobacco. She reports that she does not drink alcohol or use illicit drugs.  Allergies  Allergen Reactions  . Morphine And Related Other (See Comments)    "went crazy"-Hallucinations  . Celecoxib Other (See Comments)    Sore mouth  . Wellbutrin (Bupropion) Other (See Comments)    Hallucinations  . Zyrtec (Cetirizine) Rash   Family History  Problem Relation Age of Onset  . Other Father  45    deceased. multiple MI   . Lung cancer Mother 17    died    Prior to Admission medications   Medication Sig Start Date End Date Taking? Authorizing Provider  acetaminophen (TYLENOL) 325 MG tablet Take 162.5 mg by mouth as needed. For pain   Yes Dorothea Ogle, MD  amiodarone (PACERONE) 200 MG tablet Take 0.5 tablets (100 mg total) by mouth daily. 08/11/12  Yes Jacquelyn A McGill, MD  aspirin EC 81 MG tablet Take 1 tablet (81 mg total) by mouth daily. 08/11/12  Yes Jacquelyn A McGill, MD  carvedilol (COREG) 6.25 MG tablet Take 1  tablet (6.25 mg total) by mouth 2 (two) times daily with a meal. 08/11/12  Yes Jacquelyn A McGill, MD  diphenhydrAMINE-zinc acetate (BENADRYL) cream Apply 1 application topically 2 (two) times daily as needed. For itching 08/04/12 08/04/13 Yes Iskra Aurther Loft, MD  docusate sodium (COLACE) 100 MG capsule Take 1 capsule (100 mg total) by mouth 2 (two) times daily as needed. For constipation 08/14/12  Yes Brent Bulla, MD  donepezil (ARICEPT) 10 MG tablet Take 1 tablet (10 mg total) by mouth at bedtime. 08/11/12  Yes Jacquelyn A McGill, MD  ferrous sulfate 325 (65 FE) MG tablet Take 1 tablet (325 mg total) by mouth 2 (two) times daily. 08/11/12  Yes Jacquelyn A McGill, MD  fish oil-omega-3 fatty acids 1000 MG capsule Take 1 g by mouth daily. 08/04/12  Yes Dorothea Ogle, MD  furosemide (LASIX) 40 MG tablet Take 80-160 mg by mouth 2 (two) times daily. 4 tabs in the am, 2 tabs at bedtime 08/11/12  Yes Jacquelyn A McGill, MD  gabapentin (NEURONTIN) 600 MG tablet Take 300-600 mg by mouth 2 (two) times daily. Take 1/2 tab in the morning and 1 tab at bedtime 08/11/12  Yes Jacquelyn A McGill, MD  insulin glargine (LANTUS) 100 UNIT/ML injection Inject 24 Units into the skin every evening. 08/11/12  Yes Jacquelyn A McGill, MD  metolazone (ZAROXOLYN) 2.5 MG tablet Take 2.5 mg by mouth daily as needed. For edema 08/11/12  Yes Jacquelyn A McGill, MD  Multiple Vitamin (MULTIVITAMIN) tablet Take 1 tablet by mouth daily.     Yes Historical Provider, MD  PARoxetine (PAXIL) 40 MG tablet Take 1 tablet (40 mg total) by mouth every morning. 08/04/12  Yes Dorothea Ogle, MD  polyethylene glycol [facility] Ethelene Hal) packet Take 17 g by mouth daily as needed. For constipation   Yes Historical Provider, MD  potassium chloride SA (K-DUR,KLOR-CON) 20 MEQ tablet Take 1 tablet (20 mEq total) by mouth 3 (three) times daily. 08/11/12  Yes Jacquelyn A McGill, MD  Sennosides (SENNA) 15 MG TABS Take 1 tablet by mouth daily as needed. For  constipation   Yes Historical Provider, MD  simvastatin (ZOCOR) 10 MG tablet Take 1 tablet (10 mg total) by mouth at bedtime. 08/11/12  Yes Jacquelyn A McGill, MD  spironolactone (ALDACTONE) 12.5 mg TABS Take 0.5 tablets (12.5 mg total) by mouth 2 (two) times daily. 08/11/12  Yes Tito Dine, MD   Physical Exam: Blood pressure 116/67, pulse 95, temperature 99.7 F (37.6 C), temperature source Oral, resp. rate 19, height 5' 1.81" (1.57 m), weight 97.5 kg (214 lb 15.2 oz), SpO2 96.00%. Filed Vitals:   02/02/13 1700 02/02/13 1715 02/02/13 1730 02/02/13 1745  BP: 111/57 109/54 76/45 116/67  Pulse: 108 104 107 95  Temp:      TempSrc:      Resp:  21 21 20 19   Height:    5' 1.81" (1.57 m)  Weight:    97.5 kg (214 lb 15.2 oz)  SpO2: 97% 97% 96% 96%   Patient Vitals for the past 24 hrs:  BP Temp Temp src Pulse Resp SpO2 Height Weight  02/02/13 1745 116/67 mmHg - - 95 19 96 % 5' 1.81" (1.57 m) 97.5 kg (214 lb 15.2 oz)  02/02/13 1730 76/45 mmHg - - 107 20 96 % - -  02/02/13 1715 109/54 mmHg - - 104 21 97 % - -  02/02/13 1700 111/57 mmHg - - 108 21 97 % - -  02/02/13 1645 123/72 mmHg - - 107 23 95 % - -  02/02/13 1642 107/52 mmHg 99.7 F (37.6 C) Oral 107 18 98 % - -  02/02/13 1630 150/113 mmHg - - 41 29 96 % - -  02/02/13 1615 106/79 mmHg - - - 31 - - -     General:  Alert and oriented x3  Eyes: Pupil equal round react to light accommodation  ENT: Poor dentition  Neck: No jugular venous distention  Cardiovascular: Regular, gallop, systolic murmur from by prosthetic valve  Respiratory: Clear to auscultation bilaterally  Abdomen: Soft, nontender, bowel sounds present  Skin: Warm dry without rashes  Musculoskeletal: Intact  Psychiatric: Euthymic  Neurologic: Cranial nerves 2-12 intact, strength 5 out of 5 in all 4 extremity is, sensation intact  Labs on Admission:  Basic Metabolic Panel:  Recent Labs Lab 02/02/13 1624  NA 135  K 4.1  CL 97  CO2 21  GLUCOSE 221*   BUN 26*  CREATININE 1.16*  CALCIUM 9.8   Liver Function Tests:  Recent Labs Lab 02/02/13 1624  AST 71*  ALT 33  ALKPHOS 158*  BILITOT 1.5*  PROT 8.6*  ALBUMIN 4.2   No results found for this basename: LIPASE, AMYLASE,  in the last 168 hours No results found for this basename: AMMONIA,  in the last 168 hours CBC:  Recent Labs Lab 02/02/13 1624  WBC 20.6*  HGB 15.7*  HCT 44.9  MCV 92.0  PLT 184   Cardiac Enzymes: No results found for this basename: CKTOTAL, CKMB, CKMBINDEX, TROPONINI,  in the last 168 hours BNP: No components found with this basename: POCBNP,  CBG: No results found for this basename: GLUCAP,  in the last 168 hours  Radiological Exams on Admission: Dg Chest Portable 1 View  02/02/2013  *RADIOLOGY REPORT*  Clinical Data: Shortness of breath and tachycardia.  PORTABLE CHEST - 1 VIEW  Comparison: 07/28/2012 and prior chest radiographs  Findings: Cardiomegaly with pulmonary vascular congestion noted. Mild chronic interstitial opacities are again noted. A defibrillator pad overlying the left chest is noted. Evidence of previous cardiac surgery/valve replacement noted. There is no evidence of focal airspace disease, suspicious pulmonary nodule/mass, pleural effusion, or pneumothorax. No acute bony abnormalities are identified.  IMPRESSION: Cardiomegaly with pulmonary vascular congestion and possible minimal interstitial pulmonary edema.   Original Report Authenticated By: Harmon Pier, M.D.       Time spent: 55 minutes  Janautica Netzley [facility] Pager [phone]  If 7PM-7AM, please contact night-coverage [email or web address] Password [facility] 02/02/2013, 6:12 PM

## 2013-02-02 NOTE — ED Notes (Signed)
Per EMS pt came from home c/o diaphoresis, felt like her heart was racing and SOB. Initial BP was 140/98, when placed on monitor pt showed HR of 220 showing possible v-tach. EMS gave 150mg  amiodarone IV.

## 2013-02-02 NOTE — Progress Notes (Signed)
ANTICOAGULATION CONSULT NOTE - Initial Consult  Pharmacy Consult for heparin Indication: atrial fibrillation  Allergies  Allergen Reactions  . Morphine And Related Other (See Comments)    "went crazy"-Hallucinations  . Celecoxib Other (See Comments)    Sore mouth  . Wellbutrin (Bupropion) Other (See Comments)    Hallucinations  . Zyrtec (Cetirizine) Rash    Patient Measurements: Height: 5' 1.81" (157 cm) Weight: 214 lb 15.2 oz (97.5 kg) IBW/kg (Calculated) : 49.67 Heparin Dosing Weight: 90kg  Vital Signs: Temp: 99.7 F (37.6 C) (05/09 1642) Temp src: Oral (05/09 1642) BP: 116/67 mmHg (05/09 1745) Pulse Rate: 95 (05/09 1745)  Labs:  Recent Labs  02/02/13 1624  HGB 15.7*  HCT 44.9  PLT 184  APTT 24  LABPROT 13.5  INR 1.04  CREATININE 1.16*    Estimated Creatinine Clearance: 44.1 ml/min (by C-G formula based on Cr of 1.16).   Medical History: Past Medical History  Diagnosis Date  . Coronary artery disease 09/04/08    single vessel CABG at time of AVR   . Aortic stenosis 09/04/08    s/p AVR by Dr Tyrone Sage (tissue valve)  . Paroxysmal atrial fibrillation   . LBBB (left bundle branch block)   . CHF (congestive heart failure)     diastolic  . Sleep apnea   . Alzheimer's dementia   . Diabetes mellitus     type 2  . Hypertension   . Chronic anemia   . Osteoporosis   . Chronic renal failure   . Morbid obesity with BMI of 50.0-59.9, adult 03/09/2012    Medications:  Pending  Assessment: 79 year old female with PAF admitted from home with racing heart beat and sob. HR of 220 showing possible v-tach in ED. IV amio given, per cards will reload with po amio over the next couple of weeks. Also noted that she was previously not a coumadin candidate but will continue on IV heparin in hospital in case DCCV is needed.  Goal of Therapy:  Heparin level 0.3-0.7 units/ml Monitor platelets by anticoagulation protocol: Yes   Plan:  Give 4000 units bolus x 1 Start  heparin infusion at 1250 units/hr Check anti-Xa level in 8 hours and daily while on heparin Continue to monitor H&H and platelets  Sheppard Coil PharmD., BCPS Clinical Pharmacist Pager [phone] 02/02/2013 6:28 PM

## 2013-02-02 NOTE — Progress Notes (Signed)
CRITICAL VALUE ALERT  Critical value received:  Troponin 0.51  Date of notification:  02/02/13  Time of notification:  2135  Critical value read back:yes  Nurse who received alert:  Courtney Heys, RN  MD notified (1st page):  Dr. Antoine Poche  Time of first page:  2140  MD notified (2nd page):  Time of second page:  Responding MD:  Dr. Antoine Poche  Time MD responded:  2142

## 2013-02-02 NOTE — ED Notes (Addendum)
Pt arrives via EMS from home, family reports increased weakness and SOB for several days, per EMS, pt lethargic on arrival, when placed on monitor noted to be in V-tach with HR 220, BP stable without mental status changes, 150 mg Amiodorone hung enroute, 20g left AC, pt on 4l [place]

## 2013-02-03 ENCOUNTER — Inpatient Hospital Stay (HOSPITAL_COMMUNITY): Payer: Medicare Other

## 2013-02-03 ENCOUNTER — Encounter (HOSPITAL_COMMUNITY): Payer: Self-pay | Admitting: *Deleted

## 2013-02-03 DIAGNOSIS — I509 Heart failure, unspecified: Secondary | ICD-10-CM

## 2013-02-03 LAB — URINE MICROSCOPIC-ADD ON

## 2013-02-03 LAB — LIPID PANEL
Cholesterol: 143 mg/dL (ref 0–200)
LDL Cholesterol: 62 mg/dL (ref 0–99)
Triglycerides: 34 mg/dL (ref <150)

## 2013-02-03 LAB — URINALYSIS, ROUTINE W REFLEX MICROSCOPIC
Glucose, UA: NEGATIVE mg/dL
Hgb urine dipstick: NEGATIVE
Protein, ur: NEGATIVE mg/dL

## 2013-02-03 LAB — GLUCOSE, CAPILLARY
Glucose-Capillary: 293 mg/dL — ABNORMAL HIGH (ref 70–99)
Glucose-Capillary: 235 mg/dL — ABNORMAL HIGH (ref 70–99)
Glucose-Capillary: 184 mg/dL — ABNORMAL HIGH (ref 70–99)

## 2013-02-03 LAB — BASIC METABOLIC PANEL
BUN: 30 mg/dL — ABNORMAL HIGH (ref 6–23)
CO2: 25 mEq/L (ref 19–32)
Chloride: 98 mEq/L (ref 96–112)
Creatinine, Ser: 1.24 mg/dL — ABNORMAL HIGH (ref 0.50–1.10)
Potassium: 3.9 mEq/L (ref 3.5–5.1)

## 2013-02-03 LAB — HEMOGLOBIN A1C: Hgb A1c MFr Bld: 6.6 % — ABNORMAL HIGH (ref <5.7)

## 2013-02-03 LAB — HEPARIN LEVEL (UNFRACTIONATED): Heparin Unfractionated: 0.36 IU/mL (ref 0.30–0.70)

## 2013-02-03 LAB — COMPREHENSIVE METABOLIC PANEL
AST: 56 U/L — ABNORMAL HIGH (ref 0–37)
BUN: 32 mg/dL — ABNORMAL HIGH (ref 6–23)
CO2: 26 mEq/L (ref 19–32)
Calcium: 8.7 mg/dL (ref 8.4–10.5)
Creatinine, Ser: 1.25 mg/dL — ABNORMAL HIGH (ref 0.50–1.10)
GFR calc non Af Amer: 40 mL/min — ABNORMAL LOW (ref >90)

## 2013-02-03 LAB — T4, FREE: Free T4: 1.35 ng/dL (ref 0.80–1.80)

## 2013-02-03 LAB — TROPONIN I: Troponin I: 0.63 ng/mL (ref <0.30)

## 2013-02-03 LAB — CBC
Hemoglobin: 12.7 g/dL (ref 12.0–15.0)
MCH: 31.1 pg (ref 26.0–34.0)
MCHC: 33.7 g/dL (ref 30.0–36.0)
Platelets: 120 10*3/uL — ABNORMAL LOW (ref 150–400)

## 2013-02-03 MED ORDER — INSULIN ASPART 100 UNIT/ML ~~LOC~~ SOLN
0.0000 [IU] | Freq: Three times a day (TID) | SUBCUTANEOUS | Status: DC
Start: 2013-02-04 — End: 2013-02-09
  Administered 2013-02-03: 5 [IU] via SUBCUTANEOUS
  Administered 2013-02-04: 3 [IU] via SUBCUTANEOUS
  Administered 2013-02-04: 5 [IU] via SUBCUTANEOUS
  Administered 2013-02-04: 2 [IU] via SUBCUTANEOUS
  Administered 2013-02-05: 3 [IU] via SUBCUTANEOUS
  Administered 2013-02-05: 8 [IU] via SUBCUTANEOUS
  Administered 2013-02-05: 3 [IU] via SUBCUTANEOUS
  Administered 2013-02-06: 2 [IU] via SUBCUTANEOUS
  Administered 2013-02-06: 5 [IU] via SUBCUTANEOUS
  Administered 2013-02-06: 11 [IU] via SUBCUTANEOUS
  Administered 2013-02-07: 8 [IU] via SUBCUTANEOUS
  Administered 2013-02-07 – 2013-02-08 (×3): 3 [IU] via SUBCUTANEOUS
  Administered 2013-02-08: 11 [IU] via SUBCUTANEOUS
  Administered 2013-02-08: 8 [IU] via SUBCUTANEOUS
  Administered 2013-02-09: 3 [IU] via SUBCUTANEOUS
  Administered 2013-02-09: 5 [IU] via SUBCUTANEOUS
  Administered 2013-02-09: 11 [IU] via SUBCUTANEOUS

## 2013-02-03 MED ORDER — INSULIN GLARGINE 100 UNIT/ML ~~LOC~~ SOLN
20.0000 [IU] | Freq: Every day | SUBCUTANEOUS | Status: DC
  Administered 2013-02-03 – 2013-02-04 (×2): 20 [IU] via SUBCUTANEOUS
  Filled 2013-02-03 (×3): qty 0.2

## 2013-02-03 MED ORDER — VANCOMYCIN HCL 10 G IV SOLR
1250.0000 mg | INTRAVENOUS | Status: DC
  Administered 2013-02-03: 1250 mg via INTRAVENOUS
  Filled 2013-02-03 (×2): qty 1250

## 2013-02-03 NOTE — Progress Notes (Signed)
[facility] PROGRESS NOTE  [name] [facility] WUJ:[id number] DOB: January 07, 1933 DOA: 02/02/2013 PCP: [name], MD  Consult Note FU  Assessment/Plan: 1. Leukocytosis: -no clear evidence of infection thus far -afebrile, CXR/UA clear -FU blood Cx -monitor clinically off antibiotics  2. Mildly elevated LFTS -likely secondary to Amiodarone -USG benign  3. DM:  -continue Insulin at home dose  4. Aflutter/Diastolic CHF -amio/diuresis per cards  Thank you for the consult, we will follow  Zannie Cove, MD [phone]   HPI/Subjective: Doing better, no n/v/d/cough/congestion/SoB   Objective: Filed Vitals:   02/02/13 1830 02/02/13 1845 02/03/13 0220 02/03/13 0630  BP: 100/61 100/62 98/59 103/62  Pulse: 106 104 82 71  Temp:   98.4 F (36.9 C) 98.6 F (37 C)  TempSrc:      Resp: 21 19 20 18   Height:      Weight:      SpO2: 97% 97% 97% 96%    Intake/Output Summary (Last 24 hours) at 02/03/13 1242 Last data filed at 02/03/13 1100  Gross per 24 hour  Intake    600 ml  Output   1350 ml  Net   -750 ml   Filed Weights   02/02/13 1745  Weight: 97.5 kg (214 lb 15.2 oz)    Exam: General: Alert and oriented x3  HEENT PERRLA, EOMI, no JVD Cardiovascular: S1S2/RRR, systolic murmur   Respiratory: Clear to auscultation bilaterally  Abdomen: Soft, nontender, bowel sounds present  Skin: Warm dry without rashes  Ext: no edema c/c    Data Reviewed: Basic Metabolic Panel:  Recent Labs Lab 02/02/13 1624 02/02/13 2035 02/03/13 0157  NA 135 136 133*  136  K 4.1 4.1 3.9  3.6  CL 97 98 98  100  CO2 21 26 25  26   GLUCOSE 221* 281* 281*  205*  BUN 26* 29* 30*  32*  CREATININE 1.16* 1.27* 1.24*  1.25*  CALCIUM 9.8 8.7 8.7  8.7   Liver Function Tests:  Recent Labs Lab 02/02/13 1624 02/02/13 2035 02/03/13 0157  AST 71* 77* 56*  ALT 33 44* 39*  ALKPHOS 158* 130* 120*  BILITOT 1.5* 1.6* 1.6*  PROT 8.6* 7.1 6.8  ALBUMIN 4.2 3.5 3.1*   No results  found for this basename: LIPASE, AMYLASE,  in the last 168 hours No results found for this basename: AMMONIA,  in the last 168 hours CBC:  Recent Labs Lab 02/02/13 1624 02/03/13 0150  WBC 20.6* 21.0*  HGB 15.7* 12.7  HCT 44.9 37.7  MCV 92.0 92.4  PLT 184 120*   Cardiac Enzymes:  Recent Labs Lab 02/02/13 2035 02/03/13 0145 02/03/13 0805  TROPONINI 0.51* 0.69* 0.63*   BNP (last 3 results)  Recent Labs  04/11/12 2300 07/28/12 1536 02/02/13 2035  PROBNP 17127.0* 5777.0* 4802.0*   CBG:  Recent Labs Lab 02/02/13 2303 02/03/13 0712 02/03/13 1114  GLUCAP 293* 163* 199*    No results found for this or any previous visit (from the past 240 hour(s)).   Studies: US Abdomen Complete  02/03/2013  *RADIOLOGY REPORT*  Clinical Data:  Abnormal liver function tests.  COMPLETE ABDOMINAL ULTRASOUND  Comparison:  None.  Findings:  Gallbladder:  Removed.  Common bile duct:  Measures 0.8 cm.  Liver:  No focal lesion identified.  Within normal limits in parenchymal echogenicity.  IVC:  Appears normal.  Pancreas:  No focal abnormality seen.  Spleen:  Measures 10.0 cm and appears normal.  Right Kidney:  Measures 9.1 cm and demonstrate some cortical thinning.  Left Kidney:  Measures 9.1 cm and demonstrate some cortical thinning.  Abdominal aorta:  Negative for aneurysm.  IMPRESSION: No acute finding or abnormality to explain elevated liver function tests.  Status post cholecystectomy.   Original Report Authenticated By: Holley Dexter, M.D.    Dg Chest Portable 1 View  02/02/2013  *RADIOLOGY REPORT*  Clinical Data: Shortness of breath and tachycardia.  PORTABLE CHEST - 1 VIEW  Comparison: 07/28/2012 and prior chest radiographs  Findings: Cardiomegaly with pulmonary vascular congestion noted. Mild chronic interstitial opacities are again noted. A defibrillator pad overlying the left chest is noted. Evidence of previous cardiac surgery/valve replacement noted. There is no evidence of focal  airspace disease, suspicious pulmonary nodule/mass, pleural effusion, or pneumothorax. No acute bony abnormalities are identified.  IMPRESSION: Cardiomegaly with pulmonary vascular congestion and possible minimal interstitial pulmonary edema.   Original Report Authenticated By: Harmon Pier, M.D.     Scheduled Meds: . amiodarone  400 mg Oral BID  . aspirin EC  81 mg Oral Daily  . carvedilol  6.25 mg Oral BID WC  . diltiazem  60 mg Oral QID  . donepezil  10 mg Oral QHS  . ferrous sulfate  325 mg Oral BID  . furosemide  160 mg Oral Daily  . furosemide  80 mg Oral q1800  . gabapentin  300 mg Oral q morning - 10a  . gabapentin  600 mg Oral QHS  . insulin glargine  24 Units Subcutaneous QHS  . PARoxetine  40 mg Oral Daily  . potassium chloride SA  20 mEq Oral TID  . simvastatin  10 mg Oral QHS  . sodium chloride  3 mL Intravenous Q12H  . spironolactone  12.5 mg Oral BID   Continuous Infusions: . sodium chloride 1,000 mL (02/02/13 1615)  . heparin 1,250 Units/hr (02/03/13 1132)    Principal Problem:   PAROXYSMAL ATRIAL FIBRILLATION Active Problems:   AODM   HYPERTENSION, CONTROLLED   AORTIC VALVE REPLACEMENT, HX OF   Leukocytosis    Time spent:    [facility]  [facility] Pager [phone]. If 7PM-7AM, please contact night-coverage at [email or web address], password [facility] 02/03/2013, 12:42 PM  LOS: 1 day

## 2013-02-03 NOTE — Progress Notes (Signed)
ANTIBIOTIC CONSULT NOTE - INITIAL  Pharmacy Consult for Vancomycin Indication: leukocytosis-possible bacteremia  Allergies  Allergen Reactions  . Morphine And Related Other (See Comments)    "went crazy"-Hallucinations  . Celecoxib Other (See Comments)    Sore mouth  . Wellbutrin (Bupropion) Other (See Comments)    Hallucinations  . Zyrtec (Cetirizine) Rash    Patient Measurements: Height: 5' 1.81" (157 cm) Weight: 214 lb 15.2 oz (97.5 kg) IBW/kg (Calculated) : 49.67   Vital Signs: Temp: 98.8 F (37.1 C) (05/10 1335) Temp src: Oral (05/10 1335) BP: 121/73 mmHg (05/10 1335) Pulse Rate: 77 (05/10 1335) Intake/Output from previous day: 05/09 0701 - 05/10 0700 In: -  Out: 1050 [Urine:1050] Intake/Output from this shift: Total I/O In: 960 [P.O.:960] Out: 300 [Urine:300]  Labs:  Recent Labs  02/02/13 1624 02/02/13 2035 02/03/13 0150 02/03/13 0157  WBC 20.6*  --  21.0*  --   HGB 15.7*  --  12.7  --   PLT 184  --  120*  --   CREATININE 1.16* 1.27*  --  1.24*  1.25*   Estimated Creatinine Clearance: 41.3 ml/min (by C-G formula based on Cr of 1.24). No results found for this basename: VANCOTROUGH, Leodis Binet, VANCORANDOM, GENTTROUGH, GENTPEAK, GENTRANDOM, TOBRATROUGH, TOBRAPEAK, TOBRARND, AMIKACINPEAK, AMIKACINTROU, AMIKACIN,  in the last 72 hours   Microbiology: Recent Results (from the past 720 hour(s))  CULTURE, BLOOD (ROUTINE X 2)     Status: None   Collection Time    02/02/13  6:39 PM      Result Value Range Status   Specimen Description BLOOD ARM LEFT   Final   Special Requests BOTTLES DRAWN AEROBIC AND ANAEROBIC 10CC   Final   Culture  Setup Time 02/03/2013 05:05   Final   Culture     Final   Value: GRAM POSITIVE COCCI IN PAIRS     Note: Gram Stain Report Called to,Read Back By and Verified With: TINA WOODS 02/03/13 @ 2:29PM BY RUSCA.   Report Status PENDING   Incomplete  CULTURE, BLOOD (ROUTINE X 2)     Status: None   Collection Time    02/02/13  6:46  PM      Result Value Range Status   Specimen Description BLOOD HAND LEFT   Final   Special Requests BOTTLES DRAWN AEROBIC AND ANAEROBIC 10CC   Final   Culture  Setup Time 02/03/2013 05:05   Final   Culture     Final   Value: GRAM POSITIVE COCCI IN PAIRS     Note: Gram Stain Report Called to,Read Back By and Verified With: TINA WOODS 02/03/13 @ 2:29PM BY RUSCA.   Report Status PENDING   Incomplete    Medical History: Past Medical History  Diagnosis Date  . Coronary artery disease 09/04/08    single vessel CABG at time of AVR   . Aortic stenosis 09/04/08    s/p AVR by Dr Tyrone Sage (tissue valve)  . Paroxysmal atrial fibrillation   . LBBB (left bundle branch block)   . CHF (congestive heart failure)     diastolic  . Sleep apnea   . Alzheimer's dementia   . Diabetes mellitus     type 2  . Hypertension   . Chronic anemia   . Osteoporosis   . Chronic renal failure   . Morbid obesity with BMI of 50.0-59.9, adult 03/09/2012    Medications:  Scheduled:  . [EXPIRED] adenosine      . amiodarone  400 mg Oral BID  . [  COMPLETED] aspirin  324 mg Oral NOW   Or  . [COMPLETED] aspirin  300 mg Rectal NOW  . aspirin EC  81 mg Oral Daily  . carvedilol  6.25 mg Oral BID WC  . [COMPLETED] diltiazem  20 mg Intravenous Once  . diltiazem  60 mg Oral QID  . donepezil  10 mg Oral QHS  . ferrous sulfate  325 mg Oral BID  . furosemide  160 mg Oral Daily  . furosemide  80 mg Oral q1800  . gabapentin  300 mg Oral q morning - 10a  . gabapentin  600 mg Oral QHS  . [COMPLETED] heparin  4,000 Units Intravenous Once  . insulin glargine  24 Units Subcutaneous QHS  . PARoxetine  40 mg Oral Daily  . potassium chloride SA  20 mEq Oral TID  . simvastatin  10 mg Oral QHS  . sodium chloride  3 mL Intravenous Q12H  . spironolactone  12.5 mg Oral BID  . [DISCONTINUED] aspirin EC  81 mg Oral Daily  . [DISCONTINUED] furosemide  80-160 mg Oral BID  . [DISCONTINUED] gabapentin  300-600 mg Oral BID  .  [DISCONTINUED] PARoxetine  40 mg Oral BH-q7a   Assessment: 79 yr old female on vancomycin for possible bacteremia  Goal of Therapy:  Vancomycin trough level 15-20 mcg/ml  Plan:  Start Vancomycin 1250mg  IV q24hrs. F/u clinical status, blood cultures, renal function  Wendie Simmer, PharmD, BCPS Clinical Pharmacist  Pager: [phone]

## 2013-02-03 NOTE — Progress Notes (Signed)
Patient Name: [name] Date of Encounter: 02/03/2013    SUBJECTIVE:  The patient feels better than yesterday. No chest pain. Admitted yesterday with wide complex tachycardia.  TELEMETRY:   A flutter with variable block and ave HR 100 Filed Vitals:   02/02/13 1830 02/02/13 1845 02/03/13 0220 02/03/13 0630  BP: 100/61 100/62 98/59 103/62  Pulse: 106 104 82 71  Temp:   98.4 F (36.9 C) 98.6 F (37 C)  TempSrc:      Resp: 21 19 20 18   Height:      Weight:      SpO2: 97% 97% 97% 96%    Intake/Output Summary (Last 24 hours) at 02/03/13 0851 Last data filed at 02/03/13 0530  Gross per 24 hour  Intake      0 ml  Output   1050 ml  Net  -1050 ml    LABS: Basic Metabolic Panel:  Recent Labs  16/10/96 2035 02/03/13 0157  NA 136 133*  136  K 4.1 3.9  3.6  CL 98 98  100  CO2 26 25  26   GLUCOSE 281* 281*  205*  BUN 29* 30*  32*  CREATININE 1.27* 1.24*  1.25*  CALCIUM 8.7 8.7  8.7   CBC:  Recent Labs  02/02/13 1624 02/03/13 0150  WBC 20.6* 21.0*  HGB 15.7* 12.7  HCT 44.9 37.7  MCV 92.0 92.4  PLT 184 120*   Cardiac Enzymes:  Recent Labs  02/02/13 2035 02/03/13 0145  TROPONINI 0.51* 0.69*   BNP: No components found with this basename: POCBNP,  Hemoglobin A1C:  Recent Labs  02/02/13 2035  HGBA1C 6.6*   Fasting Lipid Panel:  Recent Labs  02/03/13 0157  CHOL 143  HDL 74  LDLCALC 62  TRIG 34  CHOLHDL 1.9    Radiology/Studies:  RADIOLOGY REPORT*  Clinical Data: Shortness of breath and tachycardia.  PORTABLE CHEST - 1 VIEW  Comparison: 07/28/2012 and prior chest radiographs  Findings: Cardiomegaly with pulmonary vascular congestion noted.  Mild chronic interstitial opacities are again noted.  A defibrillator pad overlying the left chest is noted.  Evidence of previous cardiac surgery/valve replacement noted.  There is no evidence of focal airspace disease, suspicious  pulmonary nodule/mass, pleural effusion, or pneumothorax.  No  acute bony abnormalities are identified.  IMPRESSION:  Cardiomegaly with pulmonary vascular congestion and possible  minimal interstitial pulmonary edema.    Physical Exam: Blood pressure 103/62, pulse 71, temperature 98.6 F (37 C), temperature source Oral, resp. rate 18, height 5' 1.81" (1.57 m), weight 97.5 kg (214 lb 15.2 oz), SpO2 96.00%. Weight change:    Distant heart sounds. Chest with rales left lung > right. Peripheral edema.  ASSESSMENT:  1. Recurrent atrial flutter with poor rate control. 2. Acute diastolic HF presumed with congestion on x ray and elevated BNP 3. ACS likely due to demand ischemia form rate. 4. Prior prosthetic AVR, biopros 5. Has not been on long term anticoagulation due to bleeding risk profile.  Plan:   1. Continue to load with amio 2. Any bradycardia will lead to EP eval for consideration of pacer. 3. Diuresis for CHF  Signed, Lesleigh Noe 02/03/2013, 8:51 AM

## 2013-02-03 NOTE — Progress Notes (Signed)
ANTICOAGULATION CONSULT NOTE - Initial Consult  Pharmacy Consult for heparin Indication: atrial fibrillation  Allergies  Allergen Reactions  . Morphine And Related Other (See Comments)    "went crazy"-Hallucinations  . Celecoxib Other (See Comments)    Sore mouth  . Wellbutrin (Bupropion) Other (See Comments)    Hallucinations  . Zyrtec (Cetirizine) Rash    Patient Measurements: Height: 5' 1.81" (157 cm) Weight: 214 lb 15.2 oz (97.5 kg) IBW/kg (Calculated) : 49.67 Heparin Dosing Weight: 90kg  Vital Signs: Temp: 98.4 F (36.9 C) (05/10 0220) Temp src: Oral (05/09 1642) BP: 98/59 mmHg (05/10 0220) Pulse Rate: 82 (05/10 0220)  Labs:  Recent Labs  02/02/13 1624 02/02/13 2035 02/03/13 0145 02/03/13 0150 02/03/13 0157  HGB 15.7*  --   --  12.7  --   HCT 44.9  --   --  37.7  --   PLT 184  --   --  120*  --   APTT 24  --   --   --   --   LABPROT 13.5  --   --   --   --   INR 1.04  --   --   --   --   HEPARINUNFRC  --   --   --   --  0.36  CREATININE 1.16* 1.27*  --   --  1.24*  TROPONINI  --  0.51* 0.69*  --   --     Estimated Creatinine Clearance: 41.3 ml/min (by C-G formula based on Cr of 1.24).   Medical History: Past Medical History  Diagnosis Date  . Coronary artery disease 09/04/08    single vessel CABG at time of AVR   . Aortic stenosis 09/04/08    s/p AVR by Dr Tyrone Sage (tissue valve)  . Paroxysmal atrial fibrillation   . LBBB (left bundle branch block)   . CHF (congestive heart failure)     diastolic  . Sleep apnea   . Alzheimer's dementia   . Diabetes mellitus     type 2  . Hypertension   . Chronic anemia   . Osteoporosis   . Chronic renal failure   . Morbid obesity with BMI of 50.0-59.9, adult 03/09/2012    Medications:  Pending  Assessment: 79 year old female with PAF admitted from home with racing heart beat and sob. HR of 220 showing possible v-tach in ED. IV amio given, per cards will reload with po amio over the next couple of weeks.  Also noted that she was previously not a coumadin candidate but will continue on IV heparin in hospital in case DCCV is needed. IV heparin level this am is 0.36 with no bleeding  noted  Goal of Therapy:  Heparin level 0.3-0.7 units/ml Monitor platelets by anticoagulation protocol: Yes   Plan:  Continue at current rate and follow-up daily   Janice Coffin

## 2013-02-04 DIAGNOSIS — R7881 Bacteremia: Secondary | ICD-10-CM | POA: Clinically undetermined

## 2013-02-04 DIAGNOSIS — B951 Streptococcus, group B, as the cause of diseases classified elsewhere: Secondary | ICD-10-CM

## 2013-02-04 DIAGNOSIS — L03119 Cellulitis of unspecified part of limb: Secondary | ICD-10-CM

## 2013-02-04 DIAGNOSIS — E119 Type 2 diabetes mellitus without complications: Secondary | ICD-10-CM

## 2013-02-04 DIAGNOSIS — K089 Disorder of teeth and supporting structures, unspecified: Secondary | ICD-10-CM

## 2013-02-04 DIAGNOSIS — Z96659 Presence of unspecified artificial knee joint: Secondary | ICD-10-CM

## 2013-02-04 LAB — CBC
Hemoglobin: 11.9 g/dL — ABNORMAL LOW (ref 12.0–15.0)
MCH: 30.7 pg (ref 26.0–34.0)
Platelets: 104 10*3/uL — ABNORMAL LOW (ref 150–400)
RBC: 3.87 MIL/uL (ref 3.87–5.11)
WBC: 15 10*3/uL — ABNORMAL HIGH (ref 4.0–10.5)

## 2013-02-04 LAB — BASIC METABOLIC PANEL
CO2: 23 mEq/L (ref 19–32)
Chloride: 96 mEq/L (ref 96–112)
GFR calc Af Amer: 38 mL/min — ABNORMAL LOW (ref >90)
Potassium: 3.8 mEq/L (ref 3.5–5.1)
Sodium: 131 mEq/L — ABNORMAL LOW (ref 135–145)

## 2013-02-04 LAB — URINE CULTURE: Colony Count: 30000

## 2013-02-04 LAB — HEPARIN LEVEL (UNFRACTIONATED)
Heparin Unfractionated: <0.1 IU/mL — ABNORMAL LOW (ref 0.30–0.70)
Heparin Unfractionated: 0.22 IU/mL — ABNORMAL LOW (ref 0.30–0.70)

## 2013-02-04 LAB — GLUCOSE, CAPILLARY: Glucose-Capillary: 190 mg/dL — ABNORMAL HIGH (ref 70–99)

## 2013-02-04 LAB — PRO B NATRIURETIC PEPTIDE: Pro B Natriuretic peptide (BNP): 23347 pg/mL — ABNORMAL HIGH (ref 0–450)

## 2013-02-04 MED ORDER — DEXTROSE 5 % IV SOLN
100.0000 mg | Freq: Two times a day (BID) | INTRAVENOUS | Status: DC
  Administered 2013-02-04 – 2013-02-09 (×10): 100 mg via INTRAVENOUS
  Filled 2013-02-04 (×12): qty 10

## 2013-02-04 MED ORDER — DEXTROSE 5 % IV SOLN
2.0000 g | INTRAVENOUS | Status: DC
  Administered 2013-02-04 – 2013-02-09 (×6): 2 g via INTRAVENOUS
  Filled 2013-02-04 (×6): qty 2

## 2013-02-04 MED ORDER — HEPARIN BOLUS VIA INFUSION
1000.0000 [IU] | Freq: Once | INTRAVENOUS | Status: AC
  Administered 2013-02-04: 1000 [IU] via INTRAVENOUS
  Filled 2013-02-04: qty 1000

## 2013-02-04 MED ORDER — FUROSEMIDE 10 MG/ML IJ SOLN
100.0000 mg | Freq: Two times a day (BID) | INTRAVENOUS | Status: DC
  Filled 2013-02-04 (×2): qty 10

## 2013-02-04 NOTE — Progress Notes (Signed)
ANTICOAGULATION CONSULT NOTE - Follow Up Consult  Pharmacy Consult for Heparin Indication: Atrial Fibrillation  Allergies  Allergen Reactions  . Morphine And Related Other (See Comments)    "went crazy"-Hallucinations  . Celecoxib Other (See Comments)    Sore mouth  . Wellbutrin (Bupropion) Other (See Comments)    Hallucinations  . Zyrtec (Cetirizine) Rash    Patient Measurements: Height: 5' 1.81" (157 cm) Weight: 216 lb 4.3 oz (98.1 kg) (bed) IBW/kg (Calculated) : 49.67 Heparin Dosing Weight: 73 kg  Vital Signs: Temp: 98 F (36.7 C) (05/11 1300) Temp src: Oral (05/11 1300) BP: 102/52 mmHg (05/11 1700) Pulse Rate: 58 (05/11 1712)  Labs:  Recent Labs  02/02/13 1624 02/02/13 2035 02/03/13 0145 02/03/13 0150 02/03/13 0157 02/03/13 0805 02/04/13 0450 02/04/13 1905  HGB 15.7*  --   --  12.7  --   --  11.9*  --   HCT 44.9  --   --  37.7  --   --  35.8*  --   PLT 184  --   --  120*  --   --  104*  --   APTT 24  --   --   --   --   --   --   --   LABPROT 13.5  --   --   --   --   --   --   --   INR 1.04  --   --   --   --   --   --   --   HEPARINUNFRC  --   --   --   --  0.36  --  <0.10* 0.22*  CREATININE 1.16* 1.27*  --   --  1.24*  1.25*  --  1.48*  --   TROPONINI  --  0.51* 0.69*  --   --  0.63*  --   --     Estimated Creatinine Clearance: 34.7 ml/min (by C-G formula based on Cr of 1.48).   Medications:  Heparin @ 1550 units/hr (15.5 ml/hr)  Assessment: 79 y.o. F who continues on heparin for atrial fibrillation with a SUBtherapeutic heparin level this evening (HL 0.22, goal of 0.3-0.7). No repeat CBC since this a.m -- no overt s/sx of bleeding noted at this time.  Goal of Therapy:  Heparin level 0.3-0.7 units/ml Monitor platelets by anticoagulation protocol: Yes   Plan:  1. Heparin 1000 unit bolus x 1 2. Increase heparin drip rate to 1700 units/hr (17 ml/hr) 3. Will continue to monitor for any signs/symptoms of bleeding and will follow up with heparin  level in 8 hours   Georgina Pillion, PharmD, BCPS Clinical Pharmacist Pager: [phone] 02/04/2013 8:16 PM

## 2013-02-04 NOTE — Progress Notes (Addendum)
Patient Name: [name] [facility] Date of Encounter: 02/04/2013    SUBJECTIVE: Patient has no complaints. She was sleeping when I arrived. She denies dyspnea and chest pain. No palpitations  TELEMETRY:  Difficult to tell with the patient is a flow with controlled rate or sinus bradycardia this morning. Filed Vitals:   02/03/13 2300 02/04/13 0230 02/04/13 0507 02/04/13 0800  BP:  112/68 98/61   Pulse:  69 70   Temp: 99.7 F (37.6 C) 98.7 F (37.1 C) 97.7 F (36.5 C)   TempSrc: Oral Oral Oral   Resp:  16 16   Height:      Weight:   98.1 kg (216 lb 4.3 oz)   SpO2:  95% 96% 95%    Intake/Output Summary (Last 24 hours) at 02/04/13 0944 Last data filed at 02/04/13 0600  Gross per 24 hour  Intake    800 ml  Output   1300 ml  Net   -500 ml    LABS: Basic Metabolic Panel:  Recent Labs  14/78/29 2035 02/03/13 0157  NA 136 133*  136  K 4.1 3.9  3.6  CL 98 98  100  CO2 26 25  26   GLUCOSE 281* 281*  205*  BUN 29* 30*  32*  CREATININE 1.27* 1.24*  1.25*  CALCIUM 8.7 8.7  8.7   CBC:  Recent Labs  02/03/13 0150 02/04/13 0450  WBC 21.0* 15.0*  HGB 12.7 11.9*  HCT 37.7 35.8*  MCV 92.4 92.5  PLT 120* 104*   Cardiac Enzymes:  Recent Labs  02/02/13 2035 02/03/13 0145 02/03/13 0805  TROPONINI 0.51* 0.69* 0.63*   BNP: No components found with this basename: POCBNP,  Hemoglobin A1C:  Recent Labs  02/02/13 2035  HGBA1C 6.6*   Fasting Lipid Panel:  Recent Labs  02/03/13 0157  CHOL 143  HDL 74  LDLCALC 62  TRIG 34  CHOLHDL 1.9    Radiology/Studies:  No new data  Physical Exam: Blood pressure 98/61, pulse 70, temperature 97.7 F (36.5 C), temperature source Oral, resp. rate 16, height 5' 1.81" (1.57 m), weight 98.1 kg (216 lb 4.3 oz), SpO2 95.00%. Weight change: 0.6 kg (1 lb 5.2 oz)   Chest is clear anteriorly.  Cardiac exam reveals distant heart sounds.  Moderate JVD  ASSESSMENT:  1. Atrial flutter, with better rate control. Will perform  EKG this morning to determine if she is in sinus or still in flutter. . To this point no excessive bradycardia has been noted. After ECG,clear that she has reverted to NSR/SB  2. Acute diastolic heart failure, clinically resistent  3. Acute on chronic kidney injury, related to diuresis and likely intrinsic renal disease  4. Leukocytosis with bacteremia (2 bottles positive for GPC), raises question of prosthetic valve endocarditis.  5. Elevated troponin likely related to myocardial demand in the setting of severe tachycardia  Plan:  1. Continue amiodarone  2. Continue diuresis and switch to IV for several doses  3. Does not appear that ischemic evaluation is needed  4. Will likely need TEE, will time according to Dr. Anne Fu schedule   5. Thanks to [facility]/ Dr. Jomarie Longs who will also have ID to see. Selinda Eon 02/04/2013, 9:44 AM

## 2013-02-04 NOTE — Progress Notes (Addendum)
[facility] PROGRESS NOTE  [name] [place] ZOX:[id number] DOB: 10/20/1932 DOA: 02/02/2013 PCP: [name], MD  Consult Note FU  Assessment/Plan: 1. Bacteremia GPC in pairs -started on IV Vancomycin yesterday -concern for endocarditis given no other source of infection and Aortic bioprosthetic valve -Await speciation, and sensitivity -repeat Blood Cx x2 -TTE unremarkable -will need TEE, Dr.Smith aware  2. Mildly elevated LFTS -likely secondary to Amiodarone -USG benign  3. DM:  -continue lantus, SSI  4. Aflutter/Diastolic CHF -amio/diuresis per cards  Thank you for the consult, we will follow  Zannie Cove, MD [phone]   HPI/Subjective: Doing better, no n/v/d/cough/congestion/SoB No fevers, no chills since thursday   Objective: Filed Vitals:   02/03/13 2300 02/04/13 0230 02/04/13 0507 02/04/13 0800  BP:  112/68 98/61   Pulse:  69 70   Temp: 99.7 F (37.6 C) 98.7 F (37.1 C) 97.7 F (36.5 C)   TempSrc: Oral Oral Oral   Resp:  16 16   Height:      Weight:   98.1 kg (216 lb 4.3 oz)   SpO2:  95% 96% 95%    Intake/Output Summary (Last 24 hours) at 02/04/13 1033 Last data filed at 02/04/13 0830  Gross per 24 hour  Intake   1280 ml  Output   1300 ml  Net    -20 ml   Filed Weights   02/02/13 1745 02/04/13 0507  Weight: 97.5 kg (214 lb 15.2 oz) 98.1 kg (216 lb 4.3 oz)    Exam: General: Alert and oriented x3  HEENT PERRLA, EOMI, no JVD Cardiovascular: S1S2/RRR, systolic murmur   Respiratory: Clear to auscultation bilaterally  Abdomen: Soft, nontender, bowel sounds present  Skin: Warm dry without rashes  Ext: no edema c/c    Data Reviewed: Basic Metabolic Panel:  Recent Labs Lab 02/02/13 1624 02/02/13 2035 02/03/13 0157 02/04/13 0450  NA 135 136 133*  136 131*  K 4.1 4.1 3.9  3.6 3.8  CL 97 98 98  100 96  CO2 21 26 25  26 23   GLUCOSE 221* 281* 281*  205* 148*  BUN 26* 29* 30*  32* 39*  CREATININE 1.16* 1.27* 1.24*  1.25*  1.48*  CALCIUM 9.8 8.7 8.7  8.7 8.8   Liver Function Tests:  Recent Labs Lab 02/02/13 1624 02/02/13 2035 02/03/13 0157  AST 71* 77* 56*  ALT 33 44* 39*  ALKPHOS 158* 130* 120*  BILITOT 1.5* 1.6* 1.6*  PROT 8.6* 7.1 6.8  ALBUMIN 4.2 3.5 3.1*   No results found for this basename: LIPASE, AMYLASE,  in the last 168 hours No results found for this basename: AMMONIA,  in the last 168 hours CBC:  Recent Labs Lab 02/02/13 1624 02/03/13 0150 02/04/13 0450  WBC 20.6* 21.0* 15.0*  HGB 15.7* 12.7 11.9*  HCT 44.9 37.7 35.8*  MCV 92.0 92.4 92.5  PLT 184 120* 104*   Cardiac Enzymes:  Recent Labs Lab 02/02/13 2035 02/03/13 0145 02/03/13 0805  TROPONINI 0.51* 0.69* 0.63*   BNP (last 3 results)  Recent Labs  04/11/12 2300 07/28/12 1536 02/02/13 2035  PROBNP 17127.0* 5777.0* 4802.0*   CBG:  Recent Labs Lab 02/02/13 2303 02/03/13 0712 02/03/13 1114 02/03/13 1629 02/03/13 2133  GLUCAP 293* 163* 199* 235* 184*    Recent Results (from the past 240 hour(s))  CULTURE, BLOOD (ROUTINE X 2)     Status: None   Collection Time    02/02/13  6:39 PM      Result Value Range Status  Specimen Description BLOOD ARM LEFT   Final   Special Requests BOTTLES DRAWN AEROBIC AND ANAEROBIC 10CC   Final   Culture  Setup Time 02/03/2013 05:05   Final   Culture     Final   Value: GRAM POSITIVE COCCI IN PAIRS     Note: Gram Stain Report Called to,Read Back By and Verified With: TINA WOODS 02/03/13 @ 2:29PM BY RUSCA.   Report Status PENDING   Incomplete  CULTURE, BLOOD (ROUTINE X 2)     Status: None   Collection Time    02/02/13  6:46 PM      Result Value Range Status   Specimen Description BLOOD HAND LEFT   Final   Special Requests BOTTLES DRAWN AEROBIC AND ANAEROBIC 10CC   Final   Culture  Setup Time 02/03/2013 05:05   Final   Culture     Final   Value: GRAM POSITIVE COCCI IN PAIRS     Note: Gram Stain Report Called to,Read Back By and Verified With: TINA WOODS 02/03/13 @ 2:29PM  BY RUSCA.   Report Status PENDING   Incomplete     Studies: US Abdomen Complete  02/03/2013  *RADIOLOGY REPORT*  Clinical Data:  Abnormal liver function tests.  COMPLETE ABDOMINAL ULTRASOUND  Comparison:  None.  Findings:  Gallbladder:  Removed.  Common bile duct:  Measures 0.8 cm.  Liver:  No focal lesion identified.  Within normal limits in parenchymal echogenicity.  IVC:  Appears normal.  Pancreas:  No focal abnormality seen.  Spleen:  Measures 10.0 cm and appears normal.  Right Kidney:  Measures 9.1 cm and demonstrate some cortical thinning.  Left Kidney:  Measures 9.1 cm and demonstrate some cortical thinning.  Abdominal aorta:  Negative for aneurysm.  IMPRESSION: No acute finding or abnormality to explain elevated liver function tests.  Status post cholecystectomy.   Original Report Authenticated By: Holley Dexter, M.D.    Dg Chest Portable 1 View  02/02/2013  *RADIOLOGY REPORT*  Clinical Data: Shortness of breath and tachycardia.  PORTABLE CHEST - 1 VIEW  Comparison: 07/28/2012 and prior chest radiographs  Findings: Cardiomegaly with pulmonary vascular congestion noted. Mild chronic interstitial opacities are again noted. A defibrillator pad overlying the left chest is noted. Evidence of previous cardiac surgery/valve replacement noted. There is no evidence of focal airspace disease, suspicious pulmonary nodule/mass, pleural effusion, or pneumothorax. No acute bony abnormalities are identified.  IMPRESSION: Cardiomegaly with pulmonary vascular congestion and possible minimal interstitial pulmonary edema.   Original Report Authenticated By: Harmon Pier, M.D.     Scheduled Meds: . amiodarone  400 mg Oral BID  . aspirin EC  81 mg Oral Daily  . carvedilol  6.25 mg Oral BID WC  . diltiazem  60 mg Oral QID  . donepezil  10 mg Oral QHS  . ferrous sulfate  325 mg Oral BID  . furosemide  160 mg Oral Daily  . furosemide  80 mg Oral q1800  . gabapentin  300 mg Oral q morning - 10a  . gabapentin   600 mg Oral QHS  . insulin aspart  0-15 Units Subcutaneous TID WC  . insulin glargine  20 Units Subcutaneous QHS  . PARoxetine  40 mg Oral Daily  . potassium chloride SA  20 mEq Oral TID  . simvastatin  10 mg Oral QHS  . sodium chloride  3 mL Intravenous Q12H  . spironolactone  12.5 mg Oral BID  . vancomycin  1,250 mg Intravenous Q24H   Continuous Infusions: .  sodium chloride 1,000 mL (02/02/13 1615)  . heparin 1,250 Units/hr (02/03/13 1132)    Principal Problem:   PAROXYSMAL ATRIAL FIBRILLATION Active Problems:   AODM   HYPERTENSION, CONTROLLED   AORTIC VALVE REPLACEMENT, HX OF   Leukocytosis    Time spent:    [facility]  [facility] Pager [phone]. If 7PM-7AM, please contact night-coverage at [email or web address], password [facility] 02/04/2013, 10:33 AM  LOS: 2 days

## 2013-02-04 NOTE — Consult Note (Signed)
[facility]    Date of Admission:  02/02/2013           Day 2 vancomycin       Reason for Consult: Group B streptococcal bacteremia    Referring Physician: Dr. Zannie Cove  Principal Problem:   Bacteremia due to group B Streptococcus Active Problems:   AODM   HYPERLIPIDEMIA   OBESITY, MORBID   SLEEP APNEA, OBSTRUCTIVE, MILD   HYPERTENSION, CONTROLLED   PAROXYSMAL ATRIAL FIBRILLATION   ATHEROSCLEROTIC CARDIOVASCULAR DISEASE   AORTIC VALVE REPLACEMENT, HX OF   CHF (congestive heart failure)   Poor dentition   S/P total knee arthroplasty   Recurrent cellulitis of lower leg   . amiodarone  400 mg Oral BID  . aspirin EC  81 mg Oral Daily  . carvedilol  6.25 mg Oral BID WC  . diltiazem  60 mg Oral QID  . donepezil  10 mg Oral QHS  . ferrous sulfate  325 mg Oral BID  . furosemide  100 mg Intravenous Q12H  . gabapentin  300 mg Oral q morning - 10a  . gabapentin  600 mg Oral QHS  . insulin aspart  0-15 Units Subcutaneous TID WC  . insulin glargine  20 Units Subcutaneous QHS  . PARoxetine  40 mg Oral Daily  . potassium chloride SA  20 mEq Oral TID  . simvastatin  10 mg Oral QHS  . sodium chloride  3 mL Intravenous Q12H  . spironolactone  12.5 mg Oral BID  . vancomycin  1,250 mg Intravenous Q24H    Recommendations: 1. Change vancomycin to ceftriaxone 2. Repeat blood cultures  3. Agree with echocardiogram  Assessment: She is group B streptococcal bacteremia and is at risk for prosthetic joint infection and endocarditis. Will treat her with IV ceftriaxone. We'll repeat blood cultures today and would prefer to hold off on PICC placement 2 we will blood cultures are negative. I do not see definite infection of prosthetic knee infection at this time. I agree with planned echocardiography.    HPI: [name] is a 79 y.o. female was admitted on May 9 with atrial fibrillation and flutter with rapid ventricular response. She was noted  to have a white blood cell count of 20,000 blood cultures were done. Both sets are now growing group B strep. She's had fever up to 101. She says that she just started feeling poorly recently. She went and saw orthopedic surgeon, Dr. Cleophas Dunker, several days ago because of increased right knee pain but he could not find any evidence of loosening or infection. She says her knee is feeling better now. She has a history of recurrent lower extremity cellulitis but no evidence of that recently. She has had prior bioprosthetic aortic valve replacement.   Review of Systems: Pertinent items are noted in HPI.  Past Medical History  Diagnosis Date  . Coronary artery disease 09/04/08    single vessel CABG at time of AVR   . Aortic stenosis 09/04/08    s/p AVR by Dr Tyrone Sage (tissue valve)  . Paroxysmal atrial fibrillation   . LBBB (left bundle branch block)   . CHF (congestive heart failure)     diastolic  . Sleep apnea   . Alzheimer's dementia   . Diabetes mellitus     type 2  . Hypertension   . Chronic anemia   . Osteoporosis   . Chronic renal failure   . Morbid  obesity with BMI of 50.0-59.9, adult 03/09/2012    History  Substance Use Topics  . Smoking status: Never Smoker   . Smokeless tobacco: Never Used  . Alcohol Use: No    Family History  Problem Relation Age of Onset  . Other Father 32    deceased. multiple MI   . Lung cancer Mother 33    died   Allergies  Allergen Reactions  . Morphine And Related Other (See Comments)    "went crazy"-Hallucinations  . Celecoxib Other (See Comments)    Sore mouth  . Wellbutrin (Bupropion) Other (See Comments)    Hallucinations  . Zyrtec (Cetirizine) Rash    OBJECTIVE: Blood pressure 98/61, pulse 70, temperature 97.7 F (36.5 C), temperature source Oral, resp. rate 16, height 5' 1.81" (1.57 m), weight 98.1 kg (216 lb 4.3 oz), SpO2 95.00%. General: She is asleep but arouses easily and is in no distress Oral: Many missing teeth Skin:  No splinter or conjunctival hemorrhages. No evidence of cellulitis. Lungs: Clear Cor: Very distant heart sounds. No murmur heard. Rhythm appears to be irregular on the monitor. Abdomen: Obese, soft and nontender She has healed incisions over both knees from prior arthroplasties. Her right knee is just slightly more warm than her left but with no obvious effusion and minimal discomfort with range of motion.  Microbiology: Recent Results (from the past 240 hour(s))  CULTURE, BLOOD (ROUTINE X 2)     Status: None   Collection Time    02/02/13  6:39 PM      Result Value Range Status   Specimen Description BLOOD ARM LEFT   Final   Special Requests BOTTLES DRAWN AEROBIC AND ANAEROBIC 10CC   Final   Culture  Setup Time 02/03/2013 05:05   Final   Culture     Final   Value: GROUP B STREP(S.AGALACTIAE)ISOLATED     Note: Gram Stain Report Called to,Read Back By and Verified With: TINA WOODS 02/03/13 @ 2:29PM BY RUSCA.   Report Status PENDING   Incomplete  CULTURE, BLOOD (ROUTINE X 2)     Status: None   Collection Time    02/02/13  6:46 PM      Result Value Range Status   Specimen Description BLOOD HAND LEFT   Final   Special Requests BOTTLES DRAWN AEROBIC AND ANAEROBIC 10CC   Final   Culture  Setup Time 02/03/2013 05:05   Final   Culture     Final   Value: GROUP B STREP(S.AGALACTIAE)ISOLATED     Note: Gram Stain Report Called to,Read Back By and Verified With: TINA WOODS 02/03/13 @ 2:29PM BY RUSCA.   Report Status PENDING   Incomplete  URINE CULTURE     Status: None   Collection Time    02/03/13  6:47 AM      Result Value Range Status   Specimen Description URINE, RANDOM   Final   Special Requests NONE   Final   Culture  Setup Time 02/03/2013 13:28   Final   Colony Count 30,000 COLONIES/ML   Final   Culture     Final   Value: Multiple bacterial morphotypes present, none predominant. Suggest appropriate recollection if clinically indicated.   Report Status 02/04/2013 FINAL   Final    Cliffton Asters, MD [facility] [phone] pager   [phone] cell 02/04/2013, 2:34 PM

## 2013-02-04 NOTE — Progress Notes (Addendum)
ANTICOAGULATION CONSULT NOTE - Follow up Consult  Pharmacy Consult for heparin Indication: atrial fibrillation  Allergies  Allergen Reactions  . Morphine And Related Other (See Comments)    "went crazy"-Hallucinations  . Celecoxib Other (See Comments)    Sore mouth  . Wellbutrin (Bupropion) Other (See Comments)    Hallucinations  . Zyrtec (Cetirizine) Rash    Patient Measurements: Height: 5' 1.81" (157 cm) Weight: 216 lb 4.3 oz (98.1 kg) (bed) IBW/kg (Calculated) : 49.67 Heparin Dosing Weight: 90kg  Vital Signs: Temp: 97.7 F (36.5 C) (05/11 0507) Temp src: Oral (05/11 0507) BP: 98/61 mmHg (05/11 0507) Pulse Rate: 70 (05/11 0507)  Labs:  Recent Labs  02/02/13 1624 02/02/13 2035 02/03/13 0145 02/03/13 0150 02/03/13 0157 02/03/13 0805 02/04/13 0450  HGB 15.7*  --   --  12.7  --   --  11.9*  HCT 44.9  --   --  37.7  --   --  35.8*  PLT 184  --   --  120*  --   --  104*  APTT 24  --   --   --   --   --   --   LABPROT 13.5  --   --   --   --   --   --   INR 1.04  --   --   --   --   --   --   HEPARINUNFRC  --   --   --   --  0.36  --  <0.10*  CREATININE 1.16* 1.27*  --   --  1.24*  1.25*  --  1.48*  TROPONINI  --  0.51* 0.69*  --   --  0.63*  --     Estimated Creatinine Clearance: 34.7 ml/min (by C-G formula based on Cr of 1.48).   Medical History: Past Medical History  Diagnosis Date  . Coronary artery disease 09/04/08    single vessel CABG at time of AVR   . Aortic stenosis 09/04/08    s/p AVR by Dr Tyrone Sage (tissue valve)  . Paroxysmal atrial fibrillation   . LBBB (left bundle branch block)   . CHF (congestive heart failure)     diastolic  . Sleep apnea   . Alzheimer's dementia   . Diabetes mellitus     type 2  . Hypertension   . Chronic anemia   . Osteoporosis   . Chronic renal failure   . Morbid obesity with BMI of 50.0-59.9, adult 03/09/2012     Assessment: 79 year old female with PAF admitted from home with racing heart beat and sob. HR of  220 showing possible v-tach in ED. IV amio given, per cards will reload with po amio over the next couple of weeks. Also noted that she was previously not a coumadin candidate but will continue on IV heparin in hospital in case DCCV is needed. IV heparin level this am is <0.1 with no bleeding  noted or per RN. Her Hg has dropped from 15.7 10 11.9 in under 2 days.  Patient's platelets have fallen from 184 to 104 since starting the heparin. The initial 184 reading is the highest she has been in years with her usual baseline of 100-120. She also has not had heparin within the last 30 days so the onset is more rapid than would be expected in HIT. Suspect cause of Hg and plt drp is hemodilution, still will need close monitoring   Goal of Therapy:  Heparin  level 0.3-0.7 units/ml Monitor platelets by anticoagulation protocol: Yes   Plan:  - Increase herparin gtt to 1550 units/hr - Follow up heparin level in 8 hrs - Monitor for s/sx of bleeding - Closely monitor platelets  Vania Rea. Darin Engels.D. Clinical Pharmacist Pager [phone] Phone [phone] 02/04/2013 10:35 AM

## 2013-02-05 DIAGNOSIS — I4891 Unspecified atrial fibrillation: Secondary | ICD-10-CM

## 2013-02-05 DIAGNOSIS — R7881 Bacteremia: Principal | ICD-10-CM

## 2013-02-05 DIAGNOSIS — F0280 Dementia in other diseases classified elsewhere without behavioral disturbance: Secondary | ICD-10-CM

## 2013-02-05 DIAGNOSIS — G309 Alzheimer's disease, unspecified: Secondary | ICD-10-CM

## 2013-02-05 DIAGNOSIS — I2510 Atherosclerotic heart disease of native coronary artery without angina pectoris: Secondary | ICD-10-CM

## 2013-02-05 LAB — GLUCOSE, CAPILLARY

## 2013-02-05 LAB — CBC
Hemoglobin: 10.9 g/dL — ABNORMAL LOW (ref 12.0–15.0)
MCH: 30.6 pg (ref 26.0–34.0)
MCHC: 33.3 g/dL (ref 30.0–36.0)
RDW: 14.6 % (ref 11.5–15.5)

## 2013-02-05 LAB — CULTURE, BLOOD (ROUTINE X 2)

## 2013-02-05 LAB — BASIC METABOLIC PANEL
BUN: 44 mg/dL — ABNORMAL HIGH (ref 6–23)
Calcium: 9.3 mg/dL (ref 8.4–10.5)
GFR calc Af Amer: 36 mL/min — ABNORMAL LOW (ref >90)
GFR calc non Af Amer: 31 mL/min — ABNORMAL LOW (ref >90)
Glucose, Bld: 164 mg/dL — ABNORMAL HIGH (ref 70–99)
Potassium: 4 mEq/L (ref 3.5–5.1)
Sodium: 133 mEq/L — ABNORMAL LOW (ref 135–145)

## 2013-02-05 LAB — HEPARIN LEVEL (UNFRACTIONATED): Heparin Unfractionated: 0.28 IU/mL — ABNORMAL LOW (ref 0.30–0.70)

## 2013-02-05 MED ORDER — AMIODARONE HCL 100 MG PO TABS
100.0000 mg | ORAL_TABLET | Freq: Two times a day (BID) | ORAL | Status: DC
  Administered 2013-02-05 – 2013-02-09 (×8): 100 mg via ORAL
  Filled 2013-02-05 (×9): qty 1

## 2013-02-05 MED ORDER — CEPASTAT 14.5 MG MT LOZG
1.0000 | LOZENGE | OROMUCOSAL | Status: DC | PRN
  Filled 2013-02-05: qty 18

## 2013-02-05 MED ORDER — CARVEDILOL 3.125 MG PO TABS
3.1250 mg | ORAL_TABLET | Freq: Two times a day (BID) | ORAL | Status: DC
  Administered 2013-02-05 – 2013-02-08 (×6): 3.125 mg via ORAL
  Filled 2013-02-05 (×8): qty 1

## 2013-02-05 MED ORDER — INSULIN GLARGINE 100 UNIT/ML ~~LOC~~ SOLN
24.0000 [IU] | Freq: Every day | SUBCUTANEOUS | Status: DC
  Administered 2013-02-05 – 2013-02-08 (×4): 24 [IU] via SUBCUTANEOUS
  Filled 2013-02-05 (×7): qty 0.24

## 2013-02-05 MED ORDER — MENTHOL 3 MG MT LOZG
1.0000 | LOZENGE | OROMUCOSAL | Status: DC | PRN
  Filled 2013-02-05: qty 9

## 2013-02-05 NOTE — Progress Notes (Addendum)
Subjective:  No new complaints. She feels as though she is urinating more. No new joint pain, rash. No syncope, dizziness. Asymptomatic transient bradycardia through the night.  Objective:  Vital Signs in the last 24 hours: Temp:  [98 F (36.7 C)] 98 F (36.7 C) (05/12 0433) Pulse Rate:  [55-69] 55 (05/12 0433) Resp:  [18] 18 (05/12 0433) BP: (99-111)/(52-68) 110/62 mmHg (05/12 0433) SpO2:  [94 %-96 %] 96 % (05/12 0433) Weight:  [96.9 kg (213 lb 10 oz)] 96.9 kg (213 lb 10 oz) (05/12 0433)  Intake/Output from previous day: 05/11 0701 - 05/12 0700 In: 2214 [P.O.:1660; I.V.:384; IV Piggyback:170] Out: 2775 [Urine:2775]   Physical Exam: General: Alopecia, no distress. Lungs: Clear to auscultation and percussion. Heart: Regular rate and rhythm with occasional ectopy, 2/6 systolic murmurAbdomen: soft, non-tender, positive bowel sounds. Extremities: No clubbing or cyanosis. No edema. Obese Neurologic: Alert and oriented x 3.    Lab Results:  Recent Labs  02/04/13 0450 02/05/13 0510  WBC 15.0* 7.4  HGB 11.9* 10.9*  PLT 104* 113*    Recent Labs  02/04/13 0450 02/05/13 0510  NA 131* 133*  K 3.8 4.0  CL 96 99  CO2 23 24  GLUCOSE 148* 164*  BUN 39* 44*  CREATININE 1.48* 1.56*    Recent Labs  02/03/13 0145 02/03/13 0805  TROPONINI 0.69* 0.63*   Hepatic Function Panel  Recent Labs  02/03/13 0157  PROT 6.8  ALBUMIN 3.1*  AST 56*  ALT 39*  ALKPHOS 120*  BILITOT 1.6*    Recent Labs  02/03/13 0157  CHOL 143   No results found for this basename: PROTIME,  in the last 72 hours  Imaging: No results found. Personally viewed.   Telemetry: Persistent atrial flutter with variable conduction. Atrial flutter with bradycardia noted at 2 AM, heart rate transiently in the 30s, currently in the 60s. Personally viewed.   EKG:  Continued atrial flutter  Cardiac Studies:  In 2013, normal EF, bioprosthetic aortic valve  Assessment/Plan:  Principal Problem:  Bacteremia due to group B Streptococcus Active Problems:   AODM   HYPERLIPIDEMIA   OBESITY, MORBID   SLEEP APNEA, OBSTRUCTIVE, MILD   HYPERTENSION, CONTROLLED   PAROXYSMAL ATRIAL FIBRILLATION   ATHEROSCLEROTIC CARDIOVASCULAR DISEASE   AORTIC VALVE REPLACEMENT, HX OF   CHF (congestive heart failure)   Poor dentition   S/P total knee arthroplasty   Recurrent cellulitis of lower leg  79 year old who was admitted with 1:1 atrial flutter with heart rate of 216 beats per minute, diltiazem/amiodarone decreased to 108 bpm and she is currently in the 60s. Continued flutter underlying. 2 blood cultures grew out group B strep. Has a history of bioprosthetic aortic valve.  -Given bradycardia overnight, I will last EP for suggestions for further control of atrial flutter. Given her current bacteremia, she is not a candidate for pacemaker. Once again, she was able to conduct 1:1. It is likely that continued AV nodal blockade with careful monitoring of heart rate is only option at this point. For now, I will continue with amiodarone load.  -Set up for transesophageal echocardiogram. Wed at 9:30am.  No other times available. Needs investigation of bioprosthetic aortic valve. Reviewed ID notes. She is at risk for prosthetic joint/bioprosthetic valve endocarditis. ID did not see any definite infection of prosthetic knee. There repeat blood cultures and preferred to hold off on PICC line after second set of blood cultures were negative. Leukocytosis improved. She has recently been seen by orthopedics for knee pain.  She was also recently been treated by the wound clinic for a right lower extremity wound infection which has resolved.  -Continuing with diuresis, diastolic heart failure. -1.6 L net     Geza Beranek 02/05/2013, 8:39 AM

## 2013-02-05 NOTE — Progress Notes (Signed)
Inpatient Diabetes Program Recommendations  AACE/ADA: New Consensus Statement on Inpatient Glycemic Control (2013)  Target Ranges:  Prepandial:   less than 140 mg/dL      Peak postprandial:   less than 180 mg/dL (1-2 hours)      Critically ill patients:  140 - 180 mg/dL     Results for [name], [name] (MRN [id number]) as of 02/05/2013 12:58  Ref. Range 02/04/2013 11:33 02/04/2013 16:16 02/04/2013 21:22  Glucose-Capillary Latest Range: 70-99 mg/dL 562 (H) 130 (H) 865 (H)    Results for [name], [name] (MRN [id number]) as of 02/05/2013 12:58  Ref. Range 02/05/2013 05:47 02/05/2013 12:49  Glucose-Capillary Latest Range: 70-99 mg/dL 284 (H) 132 (H)    Noted patient continues to have postprandial glucose excursions.   Inpatient Diabetes Program Recommendations Insulin - Meal Coverage: MD- Please consider adding Novolog meal coverage- Novolog 4 units tid with meals   Will follow. Ambrose Finland RN, MSN, CDE Diabetes Coordinator Inpatient Diabetes Program [phone]

## 2013-02-05 NOTE — Progress Notes (Signed)
[facility] PROGRESS NOTE  [name] [facility] QQV:[id number] DOB: 05-23-1933 DOA: 02/02/2013 PCP: [name], MD  Consult Note FU  Assessment/Plan: 1. Group B Strep Bacteremia -started on IV Ceftriaxone 5/11, was on Vancomcyin for 1 day prior -concern for endocarditis given no other source of infection and Aortic bioprosthetic valve -Await sensitivity -repeated Blood Cx x2 on 5/11 -TTE unremarkable -TEE for Wednesday -Appreciate ID consult  2. Mildly elevated LFTS -likely secondary to Amiodarone -USG benign  3. DM:  -continue lantus, SSI  4. Aflutter/Diastolic CHF -amio/diuresis per cards  Thank you for the consult, we will follow  Zannie Cove, MD [phone]   HPI/Subjective: Doing ok, c/o sore throat No fevers, no chills since Thursday Slept poorly yesterday due to lasix   Objective: Filed Vitals:   02/04/13 1712 02/04/13 2129 02/05/13 0433 02/05/13 1100  BP:  111/68 110/62 122/74  Pulse: 58 69 55 63  Temp:  98 F (36.7 C) 98 F (36.7 C)   TempSrc:  Oral Oral   Resp:  18 18   Height:      Weight:   96.9 kg (213 lb 10 oz)   SpO2:  94% 96%     Intake/Output Summary (Last 24 hours) at 02/05/13 1359 Last data filed at 02/05/13 1331  Gross per 24 hour  Intake 2112.96 ml  Output   3675 ml  Net -1562.04 ml   Filed Weights   02/02/13 1745 02/04/13 0507 02/05/13 0433  Weight: 97.5 kg (214 lb 15.2 oz) 98.1 kg (216 lb 4.3 oz) 96.9 kg (213 lb 10 oz)    Exam: General: Alert and oriented x3  HEENT PERRLA, EOMI, no JVD Cardiovascular: S1S2/RRR, systolic murmur   Respiratory: Clear to auscultation bilaterally  Abdomen: Soft, nontender, bowel sounds present  Skin: Warm dry without rashes  Ext: no edema c/c    Data Reviewed: Basic Metabolic Panel:  Recent Labs Lab 02/02/13 1624 02/02/13 2035 02/03/13 0157 02/04/13 0450 02/05/13 0510  NA 135 136 133*  136 131* 133*  K 4.1 4.1 3.9  3.6 3.8 4.0  CL 97 98 98  100 96 99  CO2 21 26 25  26 23  24   GLUCOSE 221* 281* 281*  205* 148* 164*  BUN 26* 29* 30*  32* 39* 44*  CREATININE 1.16* 1.27* 1.24*  1.25* 1.48* 1.56*  CALCIUM 9.8 8.7 8.7  8.7 8.8 9.3   Liver Function Tests:  Recent Labs Lab 02/02/13 1624 02/02/13 2035 02/03/13 0157  AST 71* 77* 56*  ALT 33 44* 39*  ALKPHOS 158* 130* 120*  BILITOT 1.5* 1.6* 1.6*  PROT 8.6* 7.1 6.8  ALBUMIN 4.2 3.5 3.1*   No results found for this basename: LIPASE, AMYLASE,  in the last 168 hours No results found for this basename: AMMONIA,  in the last 168 hours CBC:  Recent Labs Lab 02/02/13 1624 02/03/13 0150 02/04/13 0450 02/05/13 0510  WBC 20.6* 21.0* 15.0* 7.4  HGB 15.7* 12.7 11.9* 10.9*  HCT 44.9 37.7 35.8* 32.7*  MCV 92.0 92.4 92.5 91.9  PLT 184 120* 104* 113*   Cardiac Enzymes:  Recent Labs Lab 02/02/13 2035 02/03/13 0145 02/03/13 0805  TROPONINI 0.51* 0.69* 0.63*   BNP (last 3 results)  Recent Labs  07/28/12 1536 02/02/13 2035 02/04/13 1050  PROBNP 5777.0* 4802.0* 23347.0*   CBG:  Recent Labs Lab 02/04/13 1133 02/04/13 1616 02/04/13 2122 02/05/13 0547 02/05/13 1249  GLUCAP 190* 207* 214* 166* 267*    Recent Results (from the past 240 hour(s))  CULTURE, BLOOD (ROUTINE X 2)     Status: None   Collection Time    02/02/13  6:39 PM      Result Value Range Status   Specimen Description BLOOD ARM LEFT   Final   Special Requests BOTTLES DRAWN AEROBIC AND ANAEROBIC 10CC   Final   Culture  Setup Time 02/03/2013 05:05   Final   Culture     Final   Value: GROUP B STREP(S.AGALACTIAE)ISOLATED     Note: Gram Stain Report Called to,Read Back By and Verified With: TINA WOODS 02/03/13 @ 2:29PM BY RUSCA.   Report Status 02/05/2013 FINAL   Final   Organism ID, Bacteria GROUP B STREP(S.AGALACTIAE)ISOLATED   Final  CULTURE, BLOOD (ROUTINE X 2)     Status: None   Collection Time    02/02/13  6:46 PM      Result Value Range Status   Specimen Description BLOOD HAND LEFT   Final   Special Requests BOTTLES  DRAWN AEROBIC AND ANAEROBIC 10CC   Final   Culture  Setup Time 02/03/2013 05:05   Final   Culture     Final   Value: GROUP B STREP(S.AGALACTIAE)ISOLATED     Note: SUSCEPTIBILITIES PERFORMED ON PREVIOUS CULTURE WITHIN THE LAST 5 DAYS.     Note: Gram Stain Report Called to,Read Back By and Verified With: TINA WOODS 02/03/13 @ 2:29PM BY RUSCA.   Report Status 02/05/2013 FINAL   Final  URINE CULTURE     Status: None   Collection Time    02/03/13  6:47 AM      Result Value Range Status   Specimen Description URINE, RANDOM   Final   Special Requests NONE   Final   Culture  Setup Time 02/03/2013 13:28   Final   Colony Count 30,000 COLONIES/ML   Final   Culture     Final   Value: Multiple bacterial morphotypes present, none predominant. Suggest appropriate recollection if clinically indicated.   Report Status 02/04/2013 FINAL   Final  CULTURE, BLOOD (ROUTINE X 2)     Status: None   Collection Time    02/04/13 10:50 AM      Result Value Range Status   Specimen Description BLOOD LEFT ARM   Final   Special Requests BOTTLES DRAWN AEROBIC AND ANAEROBIC 10CC   Final   Culture  Setup Time 02/04/2013 19:22   Final   Culture     Final   Value:        BLOOD CULTURE RECEIVED NO GROWTH TO DATE CULTURE WILL BE HELD FOR 5 DAYS BEFORE ISSUING A FINAL NEGATIVE REPORT   Report Status PENDING   Incomplete  CULTURE, BLOOD (ROUTINE X 2)     Status: None   Collection Time    02/04/13 10:58 AM      Result Value Range Status   Specimen Description BLOOD LEFT HAND   Final   Special Requests BOTTLES DRAWN AEROBIC ONLY 10CC   Final   Culture  Setup Time 02/04/2013 19:23   Final   Culture     Final   Value:        BLOOD CULTURE RECEIVED NO GROWTH TO DATE CULTURE WILL BE HELD FOR 5 DAYS BEFORE ISSUING A FINAL NEGATIVE REPORT   Report Status PENDING   Incomplete  CULTURE, BLOOD (ROUTINE X 2)     Status: None   Collection Time    02/04/13  3:11 PM      Result Value Range Status  Specimen Description BLOOD RIGHT  HAND   Final   Special Requests BOTTLES DRAWN AEROBIC ONLY 8CC   Final   Culture  Setup Time 02/04/2013 21:42   Final   Culture     Final   Value:        BLOOD CULTURE RECEIVED NO GROWTH TO DATE CULTURE WILL BE HELD FOR 5 DAYS BEFORE ISSUING A FINAL NEGATIVE REPORT   Report Status PENDING   Incomplete  CULTURE, BLOOD (ROUTINE X 2)     Status: None   Collection Time    02/04/13  3:18 PM      Result Value Range Status   Specimen Description BLOOD LEFT ARM   Final   Special Requests BOTTLES DRAWN AEROBIC ONLY 10CC   Final   Culture  Setup Time 02/04/2013 21:42   Final   Culture     Final   Value:        BLOOD CULTURE RECEIVED NO GROWTH TO DATE CULTURE WILL BE HELD FOR 5 DAYS BEFORE ISSUING A FINAL NEGATIVE REPORT   Report Status PENDING   Incomplete     Studies: No results found.  Scheduled Meds: . amiodarone  400 mg Oral BID  . aspirin EC  81 mg Oral Daily  . carvedilol  6.25 mg Oral BID WC  . cefTRIAXone (ROCEPHIN)  IV  2 g Intravenous Q24H  . diltiazem  60 mg Oral QID  . donepezil  10 mg Oral QHS  . ferrous sulfate  325 mg Oral BID  . furosemide  100 mg Intravenous Q12H  . gabapentin  300 mg Oral q morning - 10a  . gabapentin  600 mg Oral QHS  . insulin aspart  0-15 Units Subcutaneous TID WC  . insulin glargine  24 Units Subcutaneous QHS  . PARoxetine  40 mg Oral Daily  . potassium chloride SA  20 mEq Oral TID  . simvastatin  10 mg Oral QHS  . sodium chloride  3 mL Intravenous Q12H  . spironolactone  12.5 mg Oral BID   Continuous Infusions: . sodium chloride 1,000 mL (02/02/13 1615)  . heparin 1,800 Units/hr (02/05/13 0911)    Principal Problem:   Bacteremia due to group B Streptococcus Active Problems:   AODM   HYPERLIPIDEMIA   OBESITY, MORBID   SLEEP APNEA, OBSTRUCTIVE, MILD   HYPERTENSION, CONTROLLED   PAROXYSMAL ATRIAL FIBRILLATION   ATHEROSCLEROTIC CARDIOVASCULAR DISEASE   AORTIC VALVE REPLACEMENT, HX OF   CHF (congestive heart failure)   Poor dentition    S/P total knee arthroplasty   Recurrent cellulitis of lower leg    Time spent:    [facility]  [facility] Pager [phone]. If 7PM-7AM, please contact night-coverage at [email or web address], password [facility] 02/05/2013, 1:59 PM  LOS: 3 days

## 2013-02-05 NOTE — Progress Notes (Signed)
INFECTIOUS DISEASE PROGRESS NOTE  ID: [name] is a 79 y.o. female with   Principal Problem:   Bacteremia due to group B Streptococcus Active Problems:   AODM   HYPERLIPIDEMIA   OBESITY, MORBID   SLEEP APNEA, OBSTRUCTIVE, MILD   HYPERTENSION, CONTROLLED   PAROXYSMAL ATRIAL FIBRILLATION   ATHEROSCLEROTIC CARDIOVASCULAR DISEASE   AORTIC VALVE REPLACEMENT, HX OF   CHF (congestive heart failure)   Poor dentition   S/P total knee arthroplasty   Recurrent cellulitis of lower leg  Subjective: Complains of sore throat  Abtx:  Anti-infectives   Start     Dose/Rate Route Frequency Ordered Stop   02/04/13 1600  cefTRIAXone (ROCEPHIN) 2 g in dextrose 5 % 50 mL IVPB     2 g 100 mL/hr over 30 Minutes Intravenous Every 24 hours 02/04/13 1440     02/03/13 1700  vancomycin (VANCOCIN) 1,250 mg in sodium chloride 0.9 % 250 mL IVPB  Status:  Discontinued     1,250 mg 166.7 mL/hr over 90 Minutes Intravenous Every 24 hours 02/03/13 1635 02/04/13 1440      Medications:  Scheduled: . amiodarone  400 mg Oral BID  . aspirin EC  81 mg Oral Daily  . carvedilol  6.25 mg Oral BID WC  . cefTRIAXone (ROCEPHIN)  IV  2 g Intravenous Q24H  . diltiazem  60 mg Oral QID  . donepezil  10 mg Oral QHS  . ferrous sulfate  325 mg Oral BID  . furosemide  100 mg Intravenous Q12H  . gabapentin  300 mg Oral q morning - 10a  . gabapentin  600 mg Oral QHS  . insulin aspart  0-15 Units Subcutaneous TID WC  . insulin glargine  24 Units Subcutaneous QHS  . PARoxetine  40 mg Oral Daily  . potassium chloride SA  20 mEq Oral TID  . simvastatin  10 mg Oral QHS  . sodium chloride  3 mL Intravenous Q12H  . spironolactone  12.5 mg Oral BID    Objective: Vital signs in last 24 hours: Temp:  [98 F (36.7 C)] 98 F (36.7 C) (05/12 0433) Pulse Rate:  [55-69] 63 (05/12 1100) Resp:  [18] 18 (05/12 0433) BP: (102-122)/(52-74) 122/74 mmHg (05/12 1100) SpO2:  [94 %-96 %] 96 % (05/12 0433) Weight:  [96.9 kg  (213 lb 10 oz)] 96.9 kg (213 lb 10 oz) (05/12 0433)   General appearance: alert, cooperative and no distress Resp: clear to auscultation bilaterally Cardio: irregularly irregular rhythm GI: normal findings: bowel sounds normal and soft, non-tender Her knees are non-tender, no effusions, no increase in heat.  Lab Results  Recent Labs  02/04/13 0450 02/05/13 0510  WBC 15.0* 7.4  HGB 11.9* 10.9*  HCT 35.8* 32.7*  NA 131* 133*  K 3.8 4.0  CL 96 99  CO2 23 24  BUN 39* 44*  CREATININE 1.48* 1.56*   Liver Panel  Recent Labs  02/02/13 2035 02/03/13 0157  PROT 7.1 6.8  ALBUMIN 3.5 3.1*  AST 77* 56*  ALT 44* 39*  ALKPHOS 130* 120*  BILITOT 1.6* 1.6*   Sedimentation Rate No results found for this basename: ESRSEDRATE,  in the last 72 hours C-Reactive Protein No results found for this basename: CRP,  in the last 72 hours  Microbiology: Recent Results (from the past 240 hour(s))  CULTURE, BLOOD (ROUTINE X 2)     Status: None   Collection Time    02/02/13  6:39 PM  Result Value Range Status   Specimen Description BLOOD ARM LEFT   Final   Special Requests BOTTLES DRAWN AEROBIC AND ANAEROBIC 10CC   Final   Culture  Setup Time 02/03/2013 05:05   Final   Culture     Final   Value: GROUP B STREP(S.AGALACTIAE)ISOLATED     Note: Gram Stain Report Called to,Read Back By and Verified With: TINA WOODS 02/03/13 @ 2:29PM BY RUSCA.   Report Status 02/05/2013 FINAL   Final   Organism ID, Bacteria GROUP B STREP(S.AGALACTIAE)ISOLATED   Final  CULTURE, BLOOD (ROUTINE X 2)     Status: None   Collection Time    02/02/13  6:46 PM      Result Value Range Status   Specimen Description BLOOD HAND LEFT   Final   Special Requests BOTTLES DRAWN AEROBIC AND ANAEROBIC 10CC   Final   Culture  Setup Time 02/03/2013 05:05   Final   Culture     Final   Value: GROUP B STREP(S.AGALACTIAE)ISOLATED     Note: SUSCEPTIBILITIES PERFORMED ON PREVIOUS CULTURE WITHIN THE LAST 5 DAYS.     Note: Gram  Stain Report Called to,Read Back By and Verified With: TINA WOODS 02/03/13 @ 2:29PM BY RUSCA.   Report Status 02/05/2013 FINAL   Final  URINE CULTURE     Status: None   Collection Time    02/03/13  6:47 AM      Result Value Range Status   Specimen Description URINE, RANDOM   Final   Special Requests NONE   Final   Culture  Setup Time 02/03/2013 13:28   Final   Colony Count 30,000 COLONIES/ML   Final   Culture     Final   Value: Multiple bacterial morphotypes present, none predominant. Suggest appropriate recollection if clinically indicated.   Report Status 02/04/2013 FINAL   Final  CULTURE, BLOOD (ROUTINE X 2)     Status: None   Collection Time    02/04/13 10:50 AM      Result Value Range Status   Specimen Description BLOOD LEFT ARM   Final   Special Requests BOTTLES DRAWN AEROBIC AND ANAEROBIC 10CC   Final   Culture  Setup Time 02/04/2013 19:22   Final   Culture     Final   Value:        BLOOD CULTURE RECEIVED NO GROWTH TO DATE CULTURE WILL BE HELD FOR 5 DAYS BEFORE ISSUING A FINAL NEGATIVE REPORT   Report Status PENDING   Incomplete  CULTURE, BLOOD (ROUTINE X 2)     Status: None   Collection Time    02/04/13 10:58 AM      Result Value Range Status   Specimen Description BLOOD LEFT HAND   Final   Special Requests BOTTLES DRAWN AEROBIC ONLY 10CC   Final   Culture  Setup Time 02/04/2013 19:23   Final   Culture     Final   Value:        BLOOD CULTURE RECEIVED NO GROWTH TO DATE CULTURE WILL BE HELD FOR 5 DAYS BEFORE ISSUING A FINAL NEGATIVE REPORT   Report Status PENDING   Incomplete  CULTURE, BLOOD (ROUTINE X 2)     Status: None   Collection Time    02/04/13  3:11 PM      Result Value Range Status   Specimen Description BLOOD RIGHT HAND   Final   Special Requests BOTTLES DRAWN AEROBIC ONLY 8CC   Final   Culture  Setup Time  02/04/2013 21:42   Final   Culture     Final   Value:        BLOOD CULTURE RECEIVED NO GROWTH TO DATE CULTURE WILL BE HELD FOR 5 DAYS BEFORE ISSUING A FINAL  NEGATIVE REPORT   Report Status PENDING   Incomplete  CULTURE, BLOOD (ROUTINE X 2)     Status: None   Collection Time    02/04/13  3:18 PM      Result Value Range Status   Specimen Description BLOOD LEFT ARM   Final   Special Requests BOTTLES DRAWN AEROBIC ONLY 10CC   Final   Culture  Setup Time 02/04/2013 21:42   Final   Culture     Final   Value:        BLOOD CULTURE RECEIVED NO GROWTH TO DATE CULTURE WILL BE HELD FOR 5 DAYS BEFORE ISSUING A FINAL NEGATIVE REPORT   Report Status PENDING   Incomplete    Studies/Results: No results found.   Assessment/Plan: GBS bacteremia AVR (tissue) Bilateral TKR  Total days of antibiotics: 3 (ceftriaxone) Would continue anbx Consider adding gent if her TEE is positive, very hesitant though, given her age and her CrCl 31.  Await repeat BCx prior to placing [facility]         [name] Infectious Diseases [phone] 02/05/2013, 1:57 PM   LOS: 3 days

## 2013-02-05 NOTE — Consult Note (Signed)
ELECTROPHYSIOLOGY CONSULT NOTE  Patient ID: [name] MRN: [id number], DOB/AGE: 1933-04-06   Admit date: 02/02/2013 Date of Consult: 02/05/2013  Primary Physician: [name], MD Primary Cardiologist: Anne Fu, MD Reason for Consultation: Atrial flutter  History of Present Illness [name] is a 79 year old woman with CAD s/p CABG, severe AS s/p tissue AVR, PAF, chronic LBBB, HTN, dementia, CKD and obesity who has been admitted with fevers and weakness, found to have group B Strep bacteremia. She reports she has not felt well in 3 months. She reports trouble with LE wounds and right ear and face pain. She has had intermittent fevers x 3 weeks. She denies CP, SOB or palpitations. She has dizziness with standing but denies near syncope or syncope. On admission she was found to have a regular WCT, most consistent with atrial flutter and underlying LBBB. She was taking low dose amiodarone and carvedilol prior to admission and reports compliance. Her amiodarone was increased on admission. Of note, she has had bradycardia with higher doses of amiodarone (see Dr. Jenel Lucks consult note from April 2012).     Past Medical History Past Medical History  Diagnosis Date  . Coronary artery disease 09/04/08    single vessel CABG at time of AVR   . Aortic stenosis 09/04/08    s/p AVR by Dr Tyrone Sage (tissue valve)  . Paroxysmal atrial fibrillation   . LBBB (left bundle branch block)   . CHF (congestive heart failure)     diastolic  . Sleep apnea   . Alzheimer's dementia   . Diabetes mellitus     type 2  . Hypertension   . Chronic anemia   . Osteoporosis   . Chronic renal failure   . Morbid obesity with BMI of 50.0-59.9, adult 03/09/2012    Past Surgical History Past Surgical History  Procedure Laterality Date  . Coronary artery bypass graft  09/04/08    single vessel at time of AVR  . Aortic valve replacement  12/09    tissue valve by Dr Tyrone Sage  . Cholecystectomy  1995  . Cataract  extraction    . Replacement total knee bilateral      L 2000, R 1997  . Appendectomy    . Closed reduction femoral shaft fracture  2007    R intramedullary rod    Allergies/Intolerances Allergies  Allergen Reactions  . Morphine And Related Other (See Comments)    "went crazy"-Hallucinations  . Celecoxib Other (See Comments)    Sore mouth  . Wellbutrin (Bupropion) Other (See Comments)    Hallucinations  . Zyrtec (Cetirizine) Rash   Inpatient Medications . amiodarone  400 mg Oral BID  . aspirin EC  81 mg Oral Daily  . carvedilol  6.25 mg Oral BID WC  . cefTRIAXone (ROCEPHIN)  IV  2 g Intravenous Q24H  . diltiazem  60 mg Oral QID  . donepezil  10 mg Oral QHS  . ferrous sulfate  325 mg Oral BID  . furosemide  100 mg Intravenous Q12H  . gabapentin  300 mg Oral q morning - 10a  . gabapentin  600 mg Oral QHS  . insulin aspart  0-15 Units Subcutaneous TID WC  . insulin glargine  20 Units Subcutaneous QHS  . PARoxetine  40 mg Oral Daily  . potassium chloride SA  20 mEq Oral TID  . simvastatin  10 mg Oral QHS  . sodium chloride  3 mL Intravenous Q12H  . spironolactone  12.5 mg Oral BID   .  sodium chloride 1,000 mL (02/02/13 1615)  . heparin 1,800 Units/hr (02/05/13 0911)   Family History Positive for premature CAD and lung CA   Social History Social History  . Marital Status: Widowed   Social History Main Topics  . Smoking status: Never Smoker   . Smokeless tobacco: Never Used  . Alcohol Use: No  . Drug Use: No   Social History Narrative   Lives in [place] with daughter, [name], who is an Char[employer] fundraiser.    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   Widowed 2003    Review of Systems General: No night sweats or weight changes  Cardiovascular:  No chest pain, dyspnea on exertion, edema, orthopnea, palpitations, paroxysmal nocturnal dyspnea Dermatological: No rash, lesions or masses Respiratory: No cough, dyspnea Urologic: No hematuria, dysuria Abdominal:  No nausea, vomiting, diarrhea, bright red blood per rectum, melena, or hematemesis Neurologic: No visual changes, changes in mental status All other systems reviewed and are otherwise negative except as noted above.  Physical Exam Blood pressure 110/62, pulse 55, temperature 98 F (36.7 C), temperature source Oral, resp. rate 18, height 5' 1.81" (1.57 m), weight 213 lb 10 oz (96.9 kg), SpO2 96.00%.  General: Well developed 79 year old female in no acute distress. HEENT: Normocephalic, atraumatic. EOMs intact. Sclera nonicteric. Oropharynx clear.  Neck: Supple without bruits. No JVD. Lungs: Respirations regular and unlabored, CTA bilaterally. No wheezes, rales or rhonchi. Heart: Regular. S1, S2 present. No murmurs, rub, S3 or S4. Abdomen: Soft, non-tender, non-distended. BS present x 4 quadrants.  Extremities: No clubbing, cyanosis or edema. DP/PT/Radials 2+ and equal bilaterally. Psych: Normal affect. Neuro: Alert and oriented X 3. Moves all extremities spontaneously. Musculoskeletal: No kyphosis. Skin: Intact. Warm and dry. No rashes or petechiae in exposed areas.   Labs  Recent Labs  02/02/13 2035 02/03/13 0145 02/03/13 0805  TROPONINI 0.51* 0.69* 0.63*   Lab Results  Component Value Date   WBC 7.4 02/05/2013   HGB 10.9* 02/05/2013   HCT 32.7* 02/05/2013   MCV 91.9 02/05/2013   PLT 113* 02/05/2013    Recent Labs Lab 02/03/13 0157  02/05/13 0510  NA 133*  136  < > 133*  K 3.9  3.6  < > 4.0  CL 98  100  < > 99  CO2 25  26  < > 24  BUN 30*  32*  < > 44*  CREATININE 1.24*  1.25*  < > 1.56*  CALCIUM 8.7  8.7  < > 9.3  PROT 6.8  --   --   BILITOT 1.6*  --   --   ALKPHOS 120*  --   --   ALT 39*  --   --   AST 56*  --   --   GLUCOSE 281*  205*  < > 164*  < > = values in this interval not displayed.  Lab Results  Component Value Date   CHOL 143 02/03/2013   HDL 74 02/03/2013   LDLCALC 62 02/03/2013   TRIG 34 02/03/2013    Recent Labs  02/02/13 2035  TSH 1.203     Recent Labs  02/02/13 1624  INR 1.04    Radiology/Studies US Abdomen Complete  02/03/2013  *RADIOLOGY REPORT*  Clinical Data:  Abnormal liver function tests.  COMPLETE ABDOMINAL ULTRASOUND  Comparison:  None.  Findings:  Gallbladder:  Removed.  Common bile duct:  Measures 0.8 cm.  Liver:  No focal lesion identified.  Within normal limits  in parenchymal echogenicity.  IVC:  Appears normal.  Pancreas:  No focal abnormality seen.  Spleen:  Measures 10.0 cm and appears normal.  Right Kidney:  Measures 9.1 cm and demonstrate some cortical thinning.  Left Kidney:  Measures 9.1 cm and demonstrate some cortical thinning.  Abdominal aorta:  Negative for aneurysm.  IMPRESSION: No acute finding or abnormality to explain elevated liver function tests.  Status post cholecystectomy.   Original Report Authenticated By: Holley Dexter, M.D.    Dg Chest Portable 1 View  02/02/2013  *RADIOLOGY REPORT*  Clinical Data: Shortness of breath and tachycardia.  PORTABLE CHEST - 1 VIEW  Comparison: 07/28/2012 and prior chest radiographs  Findings: Cardiomegaly with pulmonary vascular congestion noted. Mild chronic interstitial opacities are again noted. A defibrillator pad overlying the left chest is noted. Evidence of previous cardiac surgery/valve replacement noted. There is no evidence of focal airspace disease, suspicious pulmonary nodule/mass, pleural effusion, or pneumothorax. No acute bony abnormalities are identified.  IMPRESSION: Cardiomegaly with pulmonary vascular congestion and possible minimal interstitial pulmonary edema.   Original Report Authenticated By: Harmon Pier, M.D.     Echocardiogram most recent July 2013 Study Conclusions - Left ventricle: The cavity size was normal. Systolic function was normal. The estimated ejection fraction was in the range of 60% to 65%. Wall motion was normal; there were no regional wall motion abnormalities. - Ventricular septum: The contour showed  diastolic flattening. - Aortic valve: Transvalvular velocity was increased, due to stenosis. There was mild to moderate stenosis. - Mitral valve: Calcified annulus. Mildly thickened leaflets . Mild regurgitation. - Left atrium: The atrium was moderately dilated. - Right ventricle: The cavity size was moderately dilated. Wall thickness was normal. - Tricuspid valve: Moderate regurgitation. - Pulmonary arteries: Systolic pressure was moderately increased. PA peak pressure: 50mm Hg (S). - Pericardium, extracardiac: A trivial pericardial effusion was identified posterior to the heart.   12-lead ECG done by EMS show a regular WCT; repeat 12-lead ECG on presentation here shows a regular WCT but at 107 bpm; both are most consistent with atrial flutter and underlying LBBB Telemetry shows persistent atrial flutter with SVR   Assessment and Plan 1. Atrial flutter 2. PAF  3. Group B Strep bacteremia 4. AS s/p tissue AVR 5. CAD 6. Chronic LBBB   Signed, Rick Duff, PA-C 02/05/2013, 9:41 AM  Atrial fluuter with variable block   Too slow at present and as such would back off av nodal blockade  Orders written   Agree with Dr MS that device therapy for now is precluded by bacteremia

## 2013-02-06 DIAGNOSIS — I5031 Acute diastolic (congestive) heart failure: Secondary | ICD-10-CM

## 2013-02-06 LAB — CBC
HCT: 32.3 % — ABNORMAL LOW (ref 36.0–46.0)
MCH: 30.9 pg (ref 26.0–34.0)
MCV: 92.6 fL (ref 78.0–100.0)
RDW: 14.6 % (ref 11.5–15.5)
WBC: 6.6 10*3/uL (ref 4.0–10.5)

## 2013-02-06 LAB — BASIC METABOLIC PANEL
BUN: 37 mg/dL — ABNORMAL HIGH (ref 6–23)
Chloride: 98 mEq/L (ref 96–112)
GFR calc non Af Amer: 36 mL/min — ABNORMAL LOW (ref >90)
Glucose, Bld: 139 mg/dL — ABNORMAL HIGH (ref 70–99)
Potassium: 3.5 mEq/L (ref 3.5–5.1)

## 2013-02-06 LAB — GLUCOSE, CAPILLARY
Glucose-Capillary: 130 mg/dL — ABNORMAL HIGH (ref 70–99)
Glucose-Capillary: 303 mg/dL — ABNORMAL HIGH (ref 70–99)

## 2013-02-06 LAB — HEPARIN LEVEL (UNFRACTIONATED): Heparin Unfractionated: 0.51 IU/mL (ref 0.30–0.70)

## 2013-02-06 MED ORDER — SODIUM CHLORIDE 0.9 % IV SOLN: INTRAVENOUS | Status: DC

## 2013-02-06 MED ORDER — HEPARIN (PORCINE) IN NACL 100-0.45 UNIT/ML-% IJ SOLN
2050.0000 [IU]/h | INTRAMUSCULAR | Status: DC
  Administered 2013-02-06 – 2013-02-07 (×4): 2050 [IU]/h via INTRAVENOUS
  Filled 2013-02-06 (×4): qty 250

## 2013-02-06 NOTE — Progress Notes (Signed)
[facility] PROGRESS NOTE  [name] [facility] ZOX:[id number] DOB: [birth year] DOA: 02/02/2013 PCP: [name], MD  Consult Note FU  Brief Narrative: 79 year old female with CAD/CABG, Aortic valve replacement bioprosthetic, LBBB, P.atrial flutter on chronic low-dose amiodarone, diastolic heart failure, morbid obesity, diabetes woke up on day of admission and did not feel well. She felt as though her heart was pounding and she was diaphoretic. She came to the ER was noted to be in Aflutter with RVR, and leukocytosis. SHe was admitted by Dr.Skains and loaded with amiodarone, and started on CArdizem also was seen By [facility] in consultation for leukocytosis. She had blood Cx drawn on admission, which grew Group B streptococcus, Is getting work up to r/o endocarditis, TEE pending  Assessment/Plan: 1. Group B Strep Bacteremia -started on IV Ceftriaxone 5/11, was on Vancomcyin for 1 day prior -concern for endocarditis given no other source of infection and Aortic bioprosthetic valve -repeated Blood Cx x2 on 5/11: NGTD -TTE unremarkable -TEE for Wednesday -Appreciate ID consult  2. Mildly elevated LFTS -likely secondary to Amiodarone -USG benign  3. DM:  -continue lantus, SSI  4. Aflutter/Diastolic CHF -amio/diuresis per cards  Thank you for the consult, we will follow  Zannie Cove, MD [phone]   HPI/Subjective: Doing ok, sore throat improved, no fevers no chills since Thursday Slept poorly yesterday due to lasix   Objective: Filed Vitals:   02/06/13 0328 02/06/13 0500 02/06/13 0612 02/06/13 1015  BP: 117/63  110/65 110/66  Pulse: 66  65 65  Temp: 98.4 F (36.9 C)   98.2 F (36.8 C)  TempSrc: Oral   Oral  Resp: 18   20  Height:      Weight: 99.791 kg (220 lb) 99.791 kg (220 lb)    SpO2: 94%   94%    Intake/Output Summary (Last 24 hours) at 02/06/13 1355 Last data filed at 02/06/13 0900  Gross per 24 hour  Intake 1615.76 ml  Output   3525 ml  Net -1909.24 ml    Filed Weights   02/05/13 0433 02/06/13 0328 02/06/13 0500  Weight: 99.02 kg (218 lb 4.8 oz) 99.791 kg (220 lb) 99.791 kg (220 lb)    Exam: General: Alert and oriented x3  HEENT PERRLA, EOMI, no JVD Cardiovascular: S1S2/RRR, systolic murmur   Respiratory: Clear to auscultation bilaterally  Abdomen: Soft, nontender, bowel sounds present  Skin: Warm dry without rashes  Ext: no edema c/c    Data Reviewed: Basic Metabolic Panel:  Recent Labs Lab 02/02/13 2035 02/03/13 0157 02/04/13 0450 02/05/13 0510 02/06/13 0430  NA 136 133*  136 131* 133* 134*  K 4.1 3.9  3.6 3.8 4.0 3.5  CL 98 98  100 96 99 98  CO2 26 25  26 23 24 27   GLUCOSE 281* 281*  205* 148* 164* 139*  BUN 29* 30*  32* 39* 44* 37*  CREATININE 1.27* 1.24*  1.25* 1.48* 1.56* 1.38*  CALCIUM 8.7 8.7  8.7 8.8 9.3 9.4   Liver Function Tests:  Recent Labs Lab 02/02/13 1624 02/02/13 2035 02/03/13 0157  AST 71* 77* 56*  ALT 33 44* 39*  ALKPHOS 158* 130* 120*  BILITOT 1.5* 1.6* 1.6*  PROT 8.6* 7.1 6.8  ALBUMIN 4.2 3.5 3.1*   No results found for this basename: LIPASE, AMYLASE,  in the last 168 hours No results found for this basename: AMMONIA,  in the last 168 hours CBC:  Recent Labs Lab 02/02/13 1624 02/03/13 0150 02/04/13 0450 02/05/13 0510 02/06/13 0430  WBC  20.6* 21.0* 15.0* 7.4 6.6  HGB 15.7* 12.7 11.9* 10.9* 10.8*  HCT 44.9 37.7 35.8* 32.7* 32.3*  MCV 92.0 92.4 92.5 91.9 92.6  PLT 184 120* 104* 113* 123*   Cardiac Enzymes:  Recent Labs Lab 02/02/13 2035 02/03/13 0145 02/03/13 0805  TROPONINI 0.51* 0.69* 0.63*   BNP (last 3 results)  Recent Labs  07/28/12 1536 02/02/13 2035 02/04/13 1050  PROBNP 5777.0* 4802.0* 23347.0*   CBG:  Recent Labs Lab 02/05/13 0547 02/05/13 1249 02/05/13 1602 02/05/13 2137 02/06/13 0551  GLUCAP 166* 267* 196* 264* 130*    Recent Results (from the past 240 hour(s))  CULTURE, BLOOD (ROUTINE X 2)     Status: None   Collection Time     02/02/13  6:39 PM      Result Value Range Status   Specimen Description BLOOD ARM LEFT   Final   Special Requests BOTTLES DRAWN AEROBIC AND ANAEROBIC 10CC   Final   Culture  Setup Time 02/03/2013 05:05   Final   Culture     Final   Value: GROUP B STREP(S.AGALACTIAE)ISOLATED     Note: Gram Stain Report Called to,Read Back By and Verified With: TINA WOODS 02/03/13 @ 2:29PM BY RUSCA.   Report Status 02/05/2013 FINAL   Final   Organism ID, Bacteria GROUP B STREP(S.AGALACTIAE)ISOLATED   Final  CULTURE, BLOOD (ROUTINE X 2)     Status: None   Collection Time    02/02/13  6:46 PM      Result Value Range Status   Specimen Description BLOOD HAND LEFT   Final   Special Requests BOTTLES DRAWN AEROBIC AND ANAEROBIC 10CC   Final   Culture  Setup Time 02/03/2013 05:05   Final   Culture     Final   Value: GROUP B STREP(S.AGALACTIAE)ISOLATED     Note: SUSCEPTIBILITIES PERFORMED ON PREVIOUS CULTURE WITHIN THE LAST 5 DAYS.     Note: Gram Stain Report Called to,Read Back By and Verified With: TINA WOODS 02/03/13 @ 2:29PM BY RUSCA.   Report Status 02/05/2013 FINAL   Final  URINE CULTURE     Status: None   Collection Time    02/03/13  6:47 AM      Result Value Range Status   Specimen Description URINE, RANDOM   Final   Special Requests NONE   Final   Culture  Setup Time 02/03/2013 13:28   Final   Colony Count 30,000 COLONIES/ML   Final   Culture     Final   Value: Multiple bacterial morphotypes present, none predominant. Suggest appropriate recollection if clinically indicated.   Report Status 02/04/2013 FINAL   Final  CULTURE, BLOOD (ROUTINE X 2)     Status: None   Collection Time    02/04/13 10:50 AM      Result Value Range Status   Specimen Description BLOOD LEFT ARM   Final   Special Requests BOTTLES DRAWN AEROBIC AND ANAEROBIC 10CC   Final   Culture  Setup Time 02/04/2013 19:22   Final   Culture     Final   Value:        BLOOD CULTURE RECEIVED NO GROWTH TO DATE CULTURE WILL BE HELD FOR 5 DAYS  BEFORE ISSUING A FINAL NEGATIVE REPORT   Report Status PENDING   Incomplete  CULTURE, BLOOD (ROUTINE X 2)     Status: None   Collection Time    02/04/13 10:58 AM      Result Value Range Status   Specimen  Description BLOOD LEFT HAND   Final   Special Requests BOTTLES DRAWN AEROBIC ONLY 10CC   Final   Culture  Setup Time 02/04/2013 19:23   Final   Culture     Final   Value:        BLOOD CULTURE RECEIVED NO GROWTH TO DATE CULTURE WILL BE HELD FOR 5 DAYS BEFORE ISSUING A FINAL NEGATIVE REPORT   Report Status PENDING   Incomplete  CULTURE, BLOOD (ROUTINE X 2)     Status: None   Collection Time    02/04/13  3:11 PM      Result Value Range Status   Specimen Description BLOOD RIGHT HAND   Final   Special Requests BOTTLES DRAWN AEROBIC ONLY 8CC   Final   Culture  Setup Time 02/04/2013 21:42   Final   Culture     Final   Value:        BLOOD CULTURE RECEIVED NO GROWTH TO DATE CULTURE WILL BE HELD FOR 5 DAYS BEFORE ISSUING A FINAL NEGATIVE REPORT   Report Status PENDING   Incomplete  CULTURE, BLOOD (ROUTINE X 2)     Status: None   Collection Time    02/04/13  3:18 PM      Result Value Range Status   Specimen Description BLOOD LEFT ARM   Final   Special Requests BOTTLES DRAWN AEROBIC ONLY 10CC   Final   Culture  Setup Time 02/04/2013 21:42   Final   Culture     Final   Value:        BLOOD CULTURE RECEIVED NO GROWTH TO DATE CULTURE WILL BE HELD FOR 5 DAYS BEFORE ISSUING A FINAL NEGATIVE REPORT   Report Status PENDING   Incomplete     Studies: No results found.  Scheduled Meds: . amiodarone  100 mg Oral BID  . aspirin EC  81 mg Oral Daily  . carvedilol  3.125 mg Oral BID WC  . cefTRIAXone (ROCEPHIN)  IV  2 g Intravenous Q24H  . diltiazem  60 mg Oral QID  . donepezil  10 mg Oral QHS  . ferrous sulfate  325 mg Oral BID  . furosemide  100 mg Intravenous Q12H  . gabapentin  300 mg Oral q morning - 10a  . gabapentin  600 mg Oral QHS  . insulin aspart  0-15 Units Subcutaneous TID WC  .  insulin glargine  24 Units Subcutaneous QHS  . PARoxetine  40 mg Oral Daily  . potassium chloride SA  20 mEq Oral TID  . simvastatin  10 mg Oral QHS  . sodium chloride  3 mL Intravenous Q12H  . spironolactone  12.5 mg Oral BID   Continuous Infusions: . sodium chloride 1,000 mL (02/02/13 1615)  . heparin 2,050 Units/hr (02/06/13 0610)    Principal Problem:   Bacteremia due to group B Streptococcus Active Problems:   AODM   HYPERLIPIDEMIA   OBESITY, MORBID   SLEEP APNEA, OBSTRUCTIVE, MILD   HYPERTENSION, CONTROLLED   PAROXYSMAL ATRIAL FIBRILLATION   ATHEROSCLEROTIC CARDIOVASCULAR DISEASE   AORTIC VALVE REPLACEMENT, HX OF   CHF (congestive heart failure)   Poor dentition   S/P total knee arthroplasty   Recurrent cellulitis of lower leg    Time spent:    [facility]  [facility] Pager [phone]. If 7PM-7AM, please contact night-coverage at [email or web address], password [facility] 02/06/2013, 1:55 PM  LOS: 4 days

## 2013-02-06 NOTE — Progress Notes (Signed)
ANTICOAGULATION CONSULT NOTE - Follow Up Consult  Pharmacy Consult:  Heparin Indication: Atrial fibrillation  Allergies  Allergen Reactions  . Morphine And Related Other (See Comments)    "went crazy"-Hallucinations  . Celecoxib Other (See Comments)    Sore mouth  . Wellbutrin (Bupropion) Other (See Comments)    Hallucinations  . Zyrtec (Cetirizine) Rash    Patient Measurements: Height: 5' 1.81" (157 cm) Weight: 220 lb (99.791 kg) IBW/kg (Calculated) : 49.67 Heparin Dosing Weight: 73 kg  Vital Signs: Temp: 98.4 F (36.9 C) (05/13 0328) Temp src: Oral (05/13 0328) BP: 117/63 mmHg (05/13 0328) Pulse Rate: 66 (05/13 0328)  Labs:  Recent Labs  02/03/13 0805  02/04/13 0450 02/04/13 1905 02/05/13 0510 02/05/13 0525 02/06/13 0430  HGB  --   < > 11.9*  --  10.9*  --  10.8*  HCT  --   --  35.8*  --  32.7*  --  32.3*  PLT  --   --  104*  --  113*  --  123*  HEPARINUNFRC  --   < > <0.10* 0.22*  --  0.28* 0.17*  CREATININE  --   --  1.48*  --  1.56*  --   --   TROPONINI 0.63*  --   --   --   --   --   --   < > = values in this interval not displayed.  Estimated Creatinine Clearance: 33.2 ml/min (by C-G formula based on Cr of 1.56).       Assessment: 60 YOF continues on IV heparin for atrial fibrillation.  Heparin level sub-therapeutic and has trended down.  Patient's hemoglobin is relatively stable; her platelets are improving.   Goal of Therapy:  Heparin level 0.3-0.7 units/ml Monitor platelets by anticoagulation protocol: Yes     Plan:  - Increase heparin gtt to 2050 units/hr - Check 8 hr HL      Chrishun Scheer D. Laney Potash, PharmD, BCPS Pager:  [phone] 02/06/2013, 6:02 AM

## 2013-02-06 NOTE — Progress Notes (Signed)
INFECTIOUS DISEASE PROGRESS NOTE  ID: [name] is a 79 y.o. female with   Principal Problem:   Bacteremia due to group B Streptococcus Active Problems:   AODM   HYPERLIPIDEMIA   OBESITY, MORBID   SLEEP APNEA, OBSTRUCTIVE, MILD   HYPERTENSION, CONTROLLED   PAROXYSMAL ATRIAL FIBRILLATION   ATHEROSCLEROTIC CARDIOVASCULAR DISEASE   AORTIC VALVE REPLACEMENT, HX OF   CHF (congestive heart failure)   Poor dentition   S/P total knee arthroplasty   Recurrent cellulitis of lower leg  Subjective: Without complaints. No loose BM.   Abtx:  Anti-infectives   Start     Dose/Rate Route Frequency Ordered Stop   02/04/13 1600  cefTRIAXone (ROCEPHIN) 2 g in dextrose 5 % 50 mL IVPB     2 g 100 mL/hr over 30 Minutes Intravenous Every 24 hours 02/04/13 1440     02/03/13 1700  vancomycin (VANCOCIN) 1,250 mg in sodium chloride 0.9 % 250 mL IVPB  Status:  Discontinued     1,250 mg 166.7 mL/hr over 90 Minutes Intravenous Every 24 hours 02/03/13 1635 02/04/13 1440      Medications:  Scheduled: . amiodarone  100 mg Oral BID  . aspirin EC  81 mg Oral Daily  . carvedilol  3.125 mg Oral BID WC  . cefTRIAXone (ROCEPHIN)  IV  2 g Intravenous Q24H  . diltiazem  60 mg Oral QID  . donepezil  10 mg Oral QHS  . ferrous sulfate  325 mg Oral BID  . furosemide  100 mg Intravenous Q12H  . gabapentin  300 mg Oral q morning - 10a  . gabapentin  600 mg Oral QHS  . insulin aspart  0-15 Units Subcutaneous TID WC  . insulin glargine  24 Units Subcutaneous QHS  . PARoxetine  40 mg Oral Daily  . potassium chloride SA  20 mEq Oral TID  . simvastatin  10 mg Oral QHS  . sodium chloride  3 mL Intravenous Q12H  . spironolactone  12.5 mg Oral BID    Objective: Vital signs in last 24 hours: Temp:  [97.9 F (36.6 C)-98.4 F (36.9 C)] 98.2 F (36.8 C) (05/13 1015) Pulse Rate:  [51-66] 65 (05/13 1015) Resp:  [17-20] 20 (05/13 1015) BP: (109-117)/(63-66) 110/66 mmHg (05/13 1015) SpO2:  [92 %-94 %] 94 %  (05/13 1015) Weight:  [99.791 kg (220 lb)] 99.791 kg (220 lb) (05/13 0500)   General appearance: alert, cooperative and no distress Resp: clear to auscultation bilaterally Cardio: regular rate and rhythm GI: normal findings: bowel sounds normal and soft, non-tender Extremities: edema none  Lab Results  Recent Labs  02/05/13 0510 02/06/13 0430  WBC 7.4 6.6  HGB 10.9* 10.8*  HCT 32.7* 32.3*  NA 133* 134*  K 4.0 3.5  CL 99 98  CO2 24 27  BUN 44* 37*  CREATININE 1.56* 1.38*   Liver Panel No results found for this basename: PROT, ALBUMIN, AST, ALT, ALKPHOS, BILITOT, BILIDIR, IBILI,  in the last 72 hours Sedimentation Rate No results found for this basename: ESRSEDRATE,  in the last 72 hours C-Reactive Protein No results found for this basename: CRP,  in the last 72 hours  Microbiology: Recent Results (from the past 240 hour(s))  CULTURE, BLOOD (ROUTINE X 2)     Status: None   Collection Time    02/02/13  6:39 PM      Result Value Range Status   Specimen Description BLOOD ARM LEFT   Final   Special Requests  BOTTLES DRAWN AEROBIC AND ANAEROBIC 10CC   Final   Culture  Setup Time 02/03/2013 05:05   Final   Culture     Final   Value: GROUP B STREP(S.AGALACTIAE)ISOLATED     Note: Gram Stain Report Called to,Read Back By and Verified With: TINA WOODS 02/03/13 @ 2:29PM BY RUSCA.   Report Status 02/05/2013 FINAL   Final   Organism ID, Bacteria GROUP B STREP(S.AGALACTIAE)ISOLATED   Final  CULTURE, BLOOD (ROUTINE X 2)     Status: None   Collection Time    02/02/13  6:46 PM      Result Value Range Status   Specimen Description BLOOD HAND LEFT   Final   Special Requests BOTTLES DRAWN AEROBIC AND ANAEROBIC 10CC   Final   Culture  Setup Time 02/03/2013 05:05   Final   Culture     Final   Value: GROUP B STREP(S.AGALACTIAE)ISOLATED     Note: SUSCEPTIBILITIES PERFORMED ON PREVIOUS CULTURE WITHIN THE LAST 5 DAYS.     Note: Gram Stain Report Called to,Read Back By and Verified With:  TINA WOODS 02/03/13 @ 2:29PM BY RUSCA.   Report Status 02/05/2013 FINAL   Final  URINE CULTURE     Status: None   Collection Time    02/03/13  6:47 AM      Result Value Range Status   Specimen Description URINE, RANDOM   Final   Special Requests NONE   Final   Culture  Setup Time 02/03/2013 13:28   Final   Colony Count 30,000 COLONIES/ML   Final   Culture     Final   Value: Multiple bacterial morphotypes present, none predominant. Suggest appropriate recollection if clinically indicated.   Report Status 02/04/2013 FINAL   Final  CULTURE, BLOOD (ROUTINE X 2)     Status: None   Collection Time    02/04/13 10:50 AM      Result Value Range Status   Specimen Description BLOOD LEFT ARM   Final   Special Requests BOTTLES DRAWN AEROBIC AND ANAEROBIC 10CC   Final   Culture  Setup Time 02/04/2013 19:22   Final   Culture     Final   Value:        BLOOD CULTURE RECEIVED NO GROWTH TO DATE CULTURE WILL BE HELD FOR 5 DAYS BEFORE ISSUING A FINAL NEGATIVE REPORT   Report Status PENDING   Incomplete  CULTURE, BLOOD (ROUTINE X 2)     Status: None   Collection Time    02/04/13 10:58 AM      Result Value Range Status   Specimen Description BLOOD LEFT HAND   Final   Special Requests BOTTLES DRAWN AEROBIC ONLY 10CC   Final   Culture  Setup Time 02/04/2013 19:23   Final   Culture     Final   Value:        BLOOD CULTURE RECEIVED NO GROWTH TO DATE CULTURE WILL BE HELD FOR 5 DAYS BEFORE ISSUING A FINAL NEGATIVE REPORT   Report Status PENDING   Incomplete  CULTURE, BLOOD (ROUTINE X 2)     Status: None   Collection Time    02/04/13  3:11 PM      Result Value Range Status   Specimen Description BLOOD RIGHT HAND   Final   Special Requests BOTTLES DRAWN AEROBIC ONLY 8CC   Final   Culture  Setup Time 02/04/2013 21:42   Final   Culture     Final   Value:  BLOOD CULTURE RECEIVED NO GROWTH TO DATE CULTURE WILL BE HELD FOR 5 DAYS BEFORE ISSUING A FINAL NEGATIVE REPORT   Report Status PENDING   Incomplete    CULTURE, BLOOD (ROUTINE X 2)     Status: None   Collection Time    02/04/13  3:18 PM      Result Value Range Status   Specimen Description BLOOD LEFT ARM   Final   Special Requests BOTTLES DRAWN AEROBIC ONLY 10CC   Final   Culture  Setup Time 02/04/2013 21:42   Final   Culture     Final   Value:        BLOOD CULTURE RECEIVED NO GROWTH TO DATE CULTURE WILL BE HELD FOR 5 DAYS BEFORE ISSUING A FINAL NEGATIVE REPORT   Report Status PENDING   Incomplete    Studies/Results: No results found.   Assessment/Plan: GBS bacteremia  AVR (tissue)  Bilateral TKR   Total days of antibiotics: 4 (ceftriaxone)  TEE in AM Would continue current anbx  Guidelines suggest adding gent if her TEE is positive, very hesitant though, given her age and her CrCl 31.  Await repeat BCx prior to placing [facility], so far negative  Johny Sax Infectious Diseases [phone] 02/06/2013, 11:37 AM   LOS: 4 days

## 2013-02-06 NOTE — Plan of Care (Signed)
Problem: Phase I Progression Outcomes Goal: Ventricular heart rate < 120/min Outcome: Completed/Met Date Met:  02/06/13 Pt HR maintaining between 60-70  Goal: Anticoagulation Therapy per MD order Outcome: Completed/Met Date Met:  02/06/13 Pt on heparin drip per MD order

## 2013-02-06 NOTE — Progress Notes (Addendum)
Subjective:  Sitting up, pancakes for breakfast, no complaints of shortness of breath, no dizziness, no syncope. She doesn't understand why her weight is not decreasing he can she states that she is urinating quite a bit.  Objective:  Vital Signs in the last 24 hours: Temp:  [97.9 F (36.6 C)-98.4 F (36.9 C)] 98.4 F (36.9 C) (05/13 0328) Pulse Rate:  [51-66] 65 (05/13 0612) Resp:  [17-18] 18 (05/13 0328) BP: (109-122)/(63-74) 110/65 mmHg (05/13 0612) SpO2:  [92 %-94 %] 94 % (05/13 0328) Weight:  [99.791 kg (220 lb)] 99.791 kg (220 lb) (05/13 0500)  Intake/Output from previous day: 05/12 0701 - 05/13 0700 In: 2134.7 [P.O.:1504; I.V.:460.7; IV Piggyback:170] Out: 3625 [Urine:3625]   Physical Exam: General: Well developed, well nourished, in no acute distress. Head:  Normocephalic and atraumatic. Alopecia Lungs: Clear to auscultation and percussion. Heart: Normal S1 and S2, frequent ectopy.  2/6 systolic murmur right upper sternal border, no rubs or gallops.  Abdomen: soft, non-tender, positive bowel sounds. Obese Extremities: No clubbing or cyanosis. Trace edema. Neurologic: Alert and oriented x 3.    Lab Results:  Recent Labs  02/05/13 0510 02/06/13 0430  WBC 7.4 6.6  HGB 10.9* 10.8*  PLT 113* 123*    Recent Labs  02/05/13 0510 02/06/13 0430  NA 133* 134*  K 4.0 3.5  CL 99 98  CO2 24 27  GLUCOSE 164* 139*  BUN 44* 37*  CREATININE 1.56* 1.38*   Telemetry: Persistent atrial flutter, variable ventricular response, currently heart rate in the 70s/80s Personally viewed.    Cardiac Studies:  TEE pending  Assessment/Plan:   79 year old with atrial flutter admitted with rapid ventricular response, 1-1 conduction, with multiple comorbidities including diabetes, obesity, hypertension with coronary artery disease status post bypass/bioprosthetic aortic valve for aortic stenosis.  1. Atrial flutter-appreciate consultation from electrophysiology team. Carvedilol has  been decreased. Continued diltiazem at 60 every 6. Amiodarone has also been decreased to 100 mg twice a day. She has had prior bradycardia with higher dose medication/amiodarone. Agree that not candidate for pacemaker at this time given bacteremia.  2. Group B strep bacteremia - appreciate ID consult, hospitalist assistance. Ceftriaxone IV. Per ID, will need PICC line once second set of blood cultures are negative.  3. Diabetes-stable appreciate hospitalist assistance.  4. Leukocytosis-originally 20,000 now normal.  5. Diastolic heart failure/acute on chronic-continued with diuresis. Weight remains 99 kg. Negative net 3.4 L.   6. Mildly elevated troponin-secondary to tachycardia on admission.  7. CKD stage 3 - monitor.   Plan is for TEE tomorrow. We will make n.p.o. 9:30 AM.    SKAINS, MARK 02/06/2013, 8:05 AM

## 2013-02-06 NOTE — Progress Notes (Signed)
ANTICOAGULATION CONSULT NOTE - Follow Up Consult  Pharmacy Consult:  Heparin Indication: Atrial fibrillation  Allergies  Allergen Reactions  . Morphine And Related Other (See Comments)    "went crazy"-Hallucinations  . Celecoxib Other (See Comments)    Sore mouth  . Wellbutrin (Bupropion) Other (See Comments)    Hallucinations  . Zyrtec (Cetirizine) Rash    Patient Measurements: Height: 5' 1.81" (157 cm) Weight: 220 lb (99.791 kg) IBW/kg (Calculated) : 49.67 Heparin Dosing Weight: 73 kg  Vital Signs: Temp: 98.2 F (36.8 C) (05/13 1015) Temp src: Oral (05/13 1015) BP: 112/63 mmHg (05/13 1522) Pulse Rate: 65 (05/13 1015)  Labs:  Recent Labs  02/04/13 0450  02/05/13 0510 02/05/13 0525 02/06/13 0430 02/06/13 1452  HGB 11.9*  --  10.9*  --  10.8*  --   HCT 35.8*  --  32.7*  --  32.3*  --   PLT 104*  --  113*  --  123*  --   HEPARINUNFRC <0.10*  < >  --  0.28* 0.17* 0.51  CREATININE 1.48*  --  1.56*  --  1.38*  --   < > = values in this interval not displayed.  Estimated Creatinine Clearance: 37.6 ml/min (by C-G formula based on Cr of 1.38).   Assessment: 54 YOF continues on IV heparin for atrial fibrillation.  The 8hr heparin level is 0.51 which is therapeutic after heparin IV drip rate increased to 2050 units/hr this AM. No bleeding noted.  This AM the hemoglobin is relatively stable and her platelets are improving.   Goal of Therapy:  Heparin level 0.3-0.7 units/ml Monitor platelets by anticoagulation protocol: Yes    Plan:  Continue heparin gtt at 2050 units/hr Daily AM HL and CBC.    Noah Delaine, RPh Clinical Pharmacist Pager: [phone] 02/06/2013, 3:56 PM

## 2013-02-06 NOTE — Progress Notes (Signed)
Patient: Orphia Mctigue [facility] Date of Encounter: 02/06/2013, 7:36 AM Admit date: 02/02/2013     Subjective  Ms. Lory has no complaints. She denies CP, SOB or palpitations.   Objective  Physical Exam: Vitals: BP 110/65  Pulse 65  Temp(Src) 98.4 F (36.9 C) (Oral)  Resp 18  Ht 5' 1.81" (1.57 m)  Wt 220 lb (99.791 kg)  BMI 40.48 kg/m2  SpO2 94% General: Well developed 79 year old female in no acute distress. Neck: Supple. JVD not elevated. Lungs: Clear bilaterally to auscultation without wheezes, rales, or rhonchi. Breathing is unlabored. Heart: Regular S1 S2 without murmurs, rubs, or gallops.  Abdomen: Soft, non-distended. Extremities: No clubbing or cyanosis. No edema.   Neuro: Alert and oriented X 3. Moves all extremities spontaneously. No focal deficits.  Intake/Output:  Intake/Output Summary (Last 24 hours) at 02/06/13 0736 Last data filed at 02/06/13 0657  Gross per 24 hour  Intake 2134.72 ml  Output   3475 ml  Net -1340.28 ml    Inpatient Medications:  . amiodarone  100 mg Oral BID  . aspirin EC  81 mg Oral Daily  . carvedilol  3.125 mg Oral BID WC  . cefTRIAXone (ROCEPHIN)  IV  2 g Intravenous Q24H  . diltiazem  60 mg Oral QID  . donepezil  10 mg Oral QHS  . ferrous sulfate  325 mg Oral BID  . furosemide  100 mg Intravenous Q12H  . gabapentin  300 mg Oral q morning - 10a  . gabapentin  600 mg Oral QHS  . insulin aspart  0-15 Units Subcutaneous TID WC  . insulin glargine  24 Units Subcutaneous QHS  . PARoxetine  40 mg Oral Daily  . potassium chloride SA  20 mEq Oral TID  . simvastatin  10 mg Oral QHS  . sodium chloride  3 mL Intravenous Q12H  . spironolactone  12.5 mg Oral BID   . sodium chloride 1,000 mL (02/02/13 1615)  . heparin 2,050 Units/hr (02/06/13 0610)    Labs:  Recent Labs  02/05/13 0510 02/06/13 0430  NA 133* 134*  K 4.0 3.5  CL 99 98  CO2 24 27  GLUCOSE 164* 139*  BUN 44* 37*  CREATININE 1.56* 1.38*  CALCIUM 9.3 9.4    Recent  Labs  02/05/13 0510 02/06/13 0430  WBC 7.4 6.6  HGB 10.9* 10.8*  HCT 32.7* 32.3*  MCV 91.9 92.6  PLT 113* 123*    Recent Labs  02/03/13 0805  TROPONINI 0.63*    Radiology/Studies: US Abdomen Complete  02/03/2013  *RADIOLOGY REPORT*  Clinical Data:  Abnormal liver function tests.  COMPLETE ABDOMINAL ULTRASOUND  Comparison:  None.  Findings:  Gallbladder:  Removed.  Common bile duct:  Measures 0.8 cm.  Liver:  No focal lesion identified.  Within normal limits in parenchymal echogenicity.  IVC:  Appears normal.  Pancreas:  No focal abnormality seen.  Spleen:  Measures 10.0 cm and appears normal.  Right Kidney:  Measures 9.1 cm and demonstrate some cortical thinning.  Left Kidney:  Measures 9.1 cm and demonstrate some cortical thinning.  Abdominal aorta:  Negative for aneurysm.  IMPRESSION: No acute finding or abnormality to explain elevated liver function tests.  Status post cholecystectomy.   Original Report Authenticated By: Holley Dexter, M.D.    Dg Chest Portable 1 View  02/02/2013  *RADIOLOGY REPORT*  Clinical Data: Shortness of breath and tachycardia.  PORTABLE CHEST - 1 VIEW  Comparison: 07/28/2012 and prior chest radiographs  Findings: Cardiomegaly with pulmonary vascular congestion noted. Mild chronic interstitial opacities are again noted. A defibrillator pad overlying the left chest is noted. Evidence of previous cardiac surgery/valve replacement noted. There is no evidence of focal airspace disease, suspicious pulmonary nodule/mass, pleural effusion, or pneumothorax. No acute bony abnormalities are identified.  IMPRESSION: Cardiomegaly with pulmonary vascular congestion and possible minimal interstitial pulmonary edema.   Original Report Authenticated By: Harmon Pier, M.D.     Telemetry: persistent atrial flutter, rate controlled    Assessment and Plan  1. Atrial flutter  2. PAF  3. Group B Strep bacteremia  4. AS s/p tissue AVR  5. CAD  6. Chronic LBBB Ms. Lowman has  atrial flutter that is rate controlled and asymptomatic. Amiodarone and carvedilol dose decreased yesterday. Her rate is stable. PPM implant precluded by bacteremia. Dr. Graciela Husbands to see.  Signed, Hal Norrington PA-C

## 2013-02-06 NOTE — Clinical Documentation Improvement (Signed)
CKD DOCUMENTATION CLARIFICATION QUERY   CLINICAL DOCUMENTATION QUERIES ARE NOT PART OF THE PERMANENT MEDICAL RECORD   Please update your documentation within the medical record to reflect your response to this query.                                                                                         02/06/13   Dr. Anne Fu,  In a better effort to capture your patient's severity of illness, reflect appropriate length of stay and utilization of resources, a review of the patient medical record has revealed the following information:  Chronic kidney disease-continue to monitor, avoid NSAIDs H&P signed by Donato Schultz, MD at 02/02/2013 5:47 PM  Review of CHL back to 2009 - GFR  (white female) has ranged in the 30's and 40's   Known history of Hypertension and Diabetes Mellitus Type 2   Based on your clinical judgment, please document the STAGE of the patient's documented Chronic Kidney Disease in the progress notes and discharge summary:    - CKD Stage I -  GFR > OR = 90   - CKD Stage II - GFR 60-89   - CKD Stage III - GFR 30-59   - CKD Stage IV - GFR 15-29   - CKD Stage V - GFR < 15   - ESRD (End Stage Renal Disease)   - Other Condition   - Unable to Clinically Determine    In responding to this query please exercise your independent judgment.     The fact that a query is asked, does not imply that any particular answer is desired or expected.   Reviewed: additional documentation in the medical record   Thank You,  Jerral Ralph  RN BSN CCDS Certified Clinical Documentation Specialist: Cell   [phone]  Health Information Management [place]  TO RESPOND TO THE THIS QUERY, FOLLOW THE INSTRUCTIONS BELOW:  1. If needed, update documentation for the patient's encounter via the notes activity.  2. Access this query again and click edit on the In [employer].  3. After updating, or not, click F2 to complete all highlighted (required) fields concerning  your review. Select "additional documentation in the medical record" OR "no additional documentation provided".  4. Click Sign note button.  5. The deficiency will fall out of your In Basket *Please let us know if you are not able to complete this workflow by phone or e-mail (listed below).

## 2013-02-07 ENCOUNTER — Encounter (HOSPITAL_COMMUNITY)
Admission: EM | Disposition: A | Discharge status: Home Health | Payer: Self-pay | Source: Home / Self Care | Attending: Internal Medicine

## 2013-02-07 HISTORY — PX: TEE WITHOUT CARDIOVERSION: SHX5443

## 2013-02-07 LAB — GLUCOSE, CAPILLARY
Glucose-Capillary: 109 mg/dL — ABNORMAL HIGH (ref 70–99)
Glucose-Capillary: 156 mg/dL — ABNORMAL HIGH (ref 70–99)
Glucose-Capillary: 261 mg/dL — ABNORMAL HIGH (ref 70–99)

## 2013-02-07 LAB — CBC
MCHC: 33.2 g/dL (ref 30.0–36.0)
RDW: 14.5 % (ref 11.5–15.5)

## 2013-02-07 LAB — HEPARIN LEVEL (UNFRACTIONATED): Heparin Unfractionated: 0.51 IU/mL (ref 0.30–0.70)

## 2013-02-07 SURGERY — ECHOCARDIOGRAM, TRANSESOPHAGEAL
Anesthesia: Moderate Sedation

## 2013-02-07 MED ORDER — MIDAZOLAM HCL 10 MG/2ML IJ SOLN
INTRAMUSCULAR | Status: DC | PRN
  Administered 2013-02-07: 1 mg via INTRAVENOUS

## 2013-02-07 MED ORDER — MIDAZOLAM HCL 5 MG/ML IJ SOLN
INTRAMUSCULAR | Status: AC
  Filled 2013-02-07: qty 2

## 2013-02-07 MED ORDER — LIDOCAINE VISCOUS 2 % MT SOLN
OROMUCOSAL | Status: AC
  Filled 2013-02-07: qty 15

## 2013-02-07 MED ORDER — BUTAMBEN-TETRACAINE-BENZOCAINE 2-2-14 % EX AERO
INHALATION_SPRAY | CUTANEOUS | Status: DC | PRN
  Administered 2013-02-07: 2 via TOPICAL

## 2013-02-07 MED ORDER — FENTANYL CITRATE 0.05 MG/ML IJ SOLN
INTRAMUSCULAR | Status: DC | PRN
  Administered 2013-02-07: 25 ug via INTRAVENOUS

## 2013-02-07 MED ORDER — FENTANYL CITRATE 0.05 MG/ML IJ SOLN
INTRAMUSCULAR | Status: AC
  Filled 2013-02-07: qty 2

## 2013-02-07 NOTE — CV Procedure (Signed)
Normal EF No vegetations Bioprosthetic AV normal Mild MR/TR  Reassuring.

## 2013-02-07 NOTE — Progress Notes (Signed)
Subjective:  Feeling well no chest pain, no shortness of breath. Increased urination. Eating well.  Objective:  Vital Signs in the last 24 hours: Temp:  [98 F (36.7 C)-98.7 F (37.1 C)] 98.6 F (37 C) (05/14 0624) Pulse Rate:  [62-70] 68 (05/14 0624) Resp:  [18-20] 20 (05/14 0624) BP: (110-128)/(57-73) 128/73 mmHg (05/14 0624) SpO2:  [92 %-94 %] 92 % (05/14 0624) Weight:  [99.156 kg (218 lb 9.6 oz)-101.197 kg (223 lb 1.6 oz)] 99.156 kg (218 lb 9.6 oz) (05/14 0648)  Intake/Output from previous day: 05/13 0701 - 05/14 0700 In: 1364.5 [P.O.:920; I.V.:334.5; IV Piggyback:110] Out: 3800 [Urine:3800]   Physical Exam: General: Well developed, well nourished, in no acute distress.  Head: Normocephalic and atraumatic. Alopecia  Lungs: Clear to auscultation and percussion.  Heart: Normal S1 and S2, frequent ectopy. 2/6 systolic murmur right upper sternal border, no rubs or gallops.  Abdomen: soft, non-tender, positive bowel sounds. Obese  Extremities: No clubbing or cyanosis. Trace edema.  Neurologic: Alert and oriented x 3.    Lab Results:  Recent Labs  02/06/13 0430 02/07/13 0515  WBC 6.6 6.4  HGB 10.8* 11.6*  PLT 123* 130*    Recent Labs  02/05/13 0510 02/06/13 0430  NA 133* 134*  K 4.0 3.5  CL 99 98  CO2 24 27  GLUCOSE 164* 139*  BUN 44* 37*  CREATININE 1.56* 1.38*     Telemetry: Persistent atrial flutter, variable response, heart rate mostly in the 60s to 70s Personally viewed.    Cardiac Studies:  Bioprosthetic aortic valve, awake TEE this morning.  Assessment/Plan:  Principal Problem:   Bacteremia due to group B Streptococcus Active Problems:   AODM   HYPERLIPIDEMIA   OBESITY, MORBID   SLEEP APNEA, OBSTRUCTIVE, MILD   HYPERTENSION, CONTROLLED   PAROXYSMAL ATRIAL FIBRILLATION   ATHEROSCLEROTIC CARDIOVASCULAR DISEASE   AORTIC VALVE REPLACEMENT, HX OF   CHF (congestive heart failure)   Poor dentition   S/P total knee arthroplasty   Recurrent  cellulitis of lower leg  79-year-old with atrial flutter admitted with rapid ventricular response, 1-1 conduction, with multiple comorbidities including diabetes, obesity, hypertension with coronary artery disease status post bypass/bioprosthetic aortic valve for aortic stenosis.  1. Atrial flutter-appreciate consultation from electrophysiology team. Carvedilol has been decreased. Continued diltiazem at 60 every 6. Amiodarone has also been decreased to 100 mg twice a day. She has had prior bradycardia with higher dose medication/amiodarone. Agree that not candidate for pacemaker at this time given bacteremia.   2. Group B strep bacteremia - appreciate ID consult, hospitalist assistance. Ceftriaxone IV. Per ID, will need PICC line once second set of blood cultures are negative.   3. Diabetes-stable appreciate hospitalist assistance.   4. Leukocytosis-originally 20,000 now normal.   5. Diastolic heart failure/acute on chronic-continued with diuresis. Weight 101.1 down to 99.1 kg.  6. Mildly elevated troponin-secondary to tachycardia on admission.   7. CKD stage 3 - monitor.   Plan is for TEE this morning to evaluate bioprosthetic aortic valve. Possibility of endocarditis.    Rudean Icenhour 02/07/2013, 8:55 AM      

## 2013-02-07 NOTE — Progress Notes (Signed)
INFECTIOUS DISEASE PROGRESS NOTE ID: LEONTINE RADMAN is a 79 y.o. female with   Principal Problem:   Bacteremia due to group B Streptococcus Active Problems:   AODM   HYPERLIPIDEMIA   OBESITY, MORBID   SLEEP APNEA, OBSTRUCTIVE, MILD   HYPERTENSION, CONTROLLED   PAROXYSMAL ATRIAL FIBRILLATION   ATHEROSCLEROTIC CARDIOVASCULAR DISEASE   AORTIC VALVE REPLACEMENT, HX OF   CHF (congestive heart failure)   Poor dentition   S/P total knee arthroplasty   Recurrent cellulitis of lower leg  Subjective: without complaints  Abtx:  Anti-infectives   Start     Dose/Rate Route Frequency Ordered Stop   02/04/13 1600  cefTRIAXone (ROCEPHIN) 2 g in dextrose 5 % 50 mL IVPB     2 g 100 mL/hr over 30 Minutes Intravenous Every 24 hours 02/04/13 1440     02/03/13 1700  vancomycin (VANCOCIN) 1,250 mg in sodium chloride 0.9 % 250 mL IVPB  Status:  Discontinued     1,250 mg 166.7 mL/hr over 90 Minutes Intravenous Every 24 hours 02/03/13 1635 02/04/13 1440      Medications:  Scheduled: . amiodarone  100 mg Oral BID  . aspirin EC  81 mg Oral Daily  . carvedilol  3.125 mg Oral BID WC  . cefTRIAXone (ROCEPHIN)  IV  2 g Intravenous Q24H  . diltiazem  60 mg Oral QID  . donepezil  10 mg Oral QHS  . ferrous sulfate  325 mg Oral BID  . furosemide  100 mg Intravenous Q12H  . gabapentin  300 mg Oral q morning - 10a  . gabapentin  600 mg Oral QHS  . insulin aspart  0-15 Units Subcutaneous TID WC  . insulin glargine  24 Units Subcutaneous QHS  . PARoxetine  40 mg Oral Daily  . potassium chloride SA  20 mEq Oral TID  . simvastatin  10 mg Oral QHS  . sodium chloride  3 mL Intravenous Q12H  . spironolactone  12.5 mg Oral BID    Objective: Vital signs in last 24 hours: Temp:  [98 F (36.7 C)-98.8 F (37.1 C)] 98 F (36.7 C) (05/14 1433) Pulse Rate:  [62-70] 70 (05/14 1433) Resp:  [12-24] 16 (05/14 1433) BP: (108-144)/(54-73) 112/70 mmHg (05/14 1433) SpO2:  [90 %-100 %] 97 % (05/14  1433) Weight:  [99.156 kg (218 lb 9.6 oz)-101.197 kg (223 lb 1.6 oz)] 99.156 kg (218 lb 9.6 oz) (05/14 1610)   General appearance: alert, cooperative and no distress Resp: clear to auscultation bilaterally Cardio: regular rate and rhythm and systolic murmur: early systolic 2/6, crescendo at 2nd left intercostal space GI: normal findings: bowel sounds normal and soft, non-tender  Lab Results  Recent Labs  02/05/13 0510 02/06/13 0430 02/07/13 0515  WBC 7.4 6.6 6.4  HGB 10.9* 10.8* 11.6*  HCT 32.7* 32.3* 34.9*  NA 133* 134*  --   K 4.0 3.5  --   CL 99 98  --   CO2 24 27  --   BUN 44* 37*  --   CREATININE 1.56* 1.38*  --    Liver Panel No results found for this basename: PROT, ALBUMIN, AST, ALT, ALKPHOS, BILITOT, BILIDIR, IBILI,  in the last 72 hours Sedimentation Rate No results found for this basename: ESRSEDRATE,  in the last 72 hours C-Reactive Protein No results found for this basename: CRP,  in the last 72 hours  Microbiology: Recent Results (from the past 240 hour(s))  CULTURE, BLOOD (ROUTINE X 2)  Status: None   Collection Time    02/02/13  6:39 PM      Result Value Range Status   Specimen Description BLOOD ARM LEFT   Final   Special Requests BOTTLES DRAWN AEROBIC AND ANAEROBIC 10CC   Final   Culture  Setup Time 02/03/2013 05:05   Final   Culture     Final   Value: GROUP B STREP(S.AGALACTIAE)ISOLATED     Note: Gram Stain Report Called to,Read Back By and Verified With: TINA WOODS 02/03/13 @ 2:29PM BY RUSCA.   Report Status 02/05/2013 FINAL   Final   Organism ID, Bacteria GROUP B STREP(S.AGALACTIAE)ISOLATED   Final  CULTURE, BLOOD (ROUTINE X 2)     Status: None   Collection Time    02/02/13  6:46 PM      Result Value Range Status   Specimen Description BLOOD HAND LEFT   Final   Special Requests BOTTLES DRAWN AEROBIC AND ANAEROBIC 10CC   Final   Culture  Setup Time 02/03/2013 05:05   Final   Culture     Final   Value: GROUP B STREP(S.AGALACTIAE)ISOLATED      Note: SUSCEPTIBILITIES PERFORMED ON PREVIOUS CULTURE WITHIN THE LAST 5 DAYS.     Note: Gram Stain Report Called to,Read Back By and Verified With: TINA WOODS 02/03/13 @ 2:29PM BY RUSCA.   Report Status 02/05/2013 FINAL   Final  URINE CULTURE     Status: None   Collection Time    02/03/13  6:47 AM      Result Value Range Status   Specimen Description URINE, RANDOM   Final   Special Requests NONE   Final   Culture  Setup Time 02/03/2013 13:28   Final   Colony Count 30,000 COLONIES/ML   Final   Culture     Final   Value: Multiple bacterial morphotypes present, none predominant. Suggest appropriate recollection if clinically indicated.   Report Status 02/04/2013 FINAL   Final  CULTURE, BLOOD (ROUTINE X 2)     Status: None   Collection Time    02/04/13 10:50 AM      Result Value Range Status   Specimen Description BLOOD LEFT ARM   Final   Special Requests BOTTLES DRAWN AEROBIC AND ANAEROBIC 10CC   Final   Culture  Setup Time 02/04/2013 19:22   Final   Culture     Final   Value:        BLOOD CULTURE RECEIVED NO GROWTH TO DATE CULTURE WILL BE HELD FOR 5 DAYS BEFORE ISSUING A FINAL NEGATIVE REPORT   Report Status PENDING   Incomplete  CULTURE, BLOOD (ROUTINE X 2)     Status: None   Collection Time    02/04/13 10:58 AM      Result Value Range Status   Specimen Description BLOOD LEFT HAND   Final   Special Requests BOTTLES DRAWN AEROBIC ONLY 10CC   Final   Culture  Setup Time 02/04/2013 19:23   Final   Culture     Final   Value:        BLOOD CULTURE RECEIVED NO GROWTH TO DATE CULTURE WILL BE HELD FOR 5 DAYS BEFORE ISSUING A FINAL NEGATIVE REPORT   Report Status PENDING   Incomplete  CULTURE, BLOOD (ROUTINE X 2)     Status: None   Collection Time    02/04/13  3:11 PM      Result Value Range Status   Specimen Description BLOOD RIGHT HAND   Final  Special Requests BOTTLES DRAWN AEROBIC ONLY 8CC   Final   Culture  Setup Time 02/04/2013 21:42   Final   Culture     Final   Value:         BLOOD CULTURE RECEIVED NO GROWTH TO DATE CULTURE WILL BE HELD FOR 5 DAYS BEFORE ISSUING A FINAL NEGATIVE REPORT   Report Status PENDING   Incomplete  CULTURE, BLOOD (ROUTINE X 2)     Status: None   Collection Time    02/04/13  3:18 PM      Result Value Range Status   Specimen Description BLOOD LEFT ARM   Final   Special Requests BOTTLES DRAWN AEROBIC ONLY 10CC   Final   Culture  Setup Time 02/04/2013 21:42   Final   Culture     Final   Value:        BLOOD CULTURE RECEIVED NO GROWTH TO DATE CULTURE WILL BE HELD FOR 5 DAYS BEFORE ISSUING A FINAL NEGATIVE REPORT   Report Status PENDING   Incomplete    Studies/Results: No results found.   Assessment/Plan: GBS bacteremia  AVR (tissue)  Bilateral TKR  Total days of antibiotics: 5 (ceftriaxone)   TEE (-)  Would continue current anbx Await repeat BCx are negative to date. Would place pic and treat her for 4 weeks (including her 5 days so far, therefore 23 more days).  This is a long course with her negative studies, however she is very HIGH risk with AVR and TKRs.  would add rifampin 300mg  qday if ok with CV and her meds can be adjusted.   She needs repeat BCx 1 week after she completes her anbx  Happy to see in f/u in ID if needed.   Johny Sax Infectious Diseases [phone] 02/07/2013, 7:00 PM   LOS: 5 days

## 2013-02-07 NOTE — Progress Notes (Signed)
  Echocardiogram Echocardiogram Transesophageal has been performed.  [name] 02/07/2013, 10:50 AM

## 2013-02-07 NOTE — H&P (View-Only) (Signed)
Subjective:  Feeling well no chest pain, no shortness of breath. Increased urination. Eating well.  Objective:  Vital Signs in the last 24 hours: Temp:  [98 F (36.7 C)-98.7 F (37.1 C)] 98.6 F (37 C) (05/14 0624) Pulse Rate:  [62-70] 68 (05/14 0624) Resp:  [18-20] 20 (05/14 0624) BP: (110-128)/(57-73) 128/73 mmHg (05/14 0624) SpO2:  [92 %-94 %] 92 % (05/14 0624) Weight:  [99.156 kg (218 lb 9.6 oz)-101.197 kg (223 lb 1.6 oz)] 99.156 kg (218 lb 9.6 oz) (05/14 0648)  Intake/Output from previous day: 05/13 0701 - 05/14 0700 In: 1364.5 [P.O.:920; I.V.:334.5; IV Piggyback:110] Out: 3800 [Urine:3800]   Physical Exam: General: Well developed, well nourished, in no acute distress.  Head: Normocephalic and atraumatic. Alopecia  Lungs: Clear to auscultation and percussion.  Heart: Normal S1 and S2, frequent ectopy. 2/6 systolic murmur right upper sternal border, no rubs or gallops.  Abdomen: soft, non-tender, positive bowel sounds. Obese  Extremities: No clubbing or cyanosis. Trace edema.  Neurologic: Alert and oriented x 3.    Lab Results:  Recent Labs  02/06/13 0430 02/07/13 0515  WBC 6.6 6.4  HGB 10.8* 11.6*  PLT 123* 130*    Recent Labs  02/05/13 0510 02/06/13 0430  NA 133* 134*  K 4.0 3.5  CL 99 98  CO2 24 27  GLUCOSE 164* 139*  BUN 44* 37*  CREATININE 1.56* 1.38*     Telemetry: Persistent atrial flutter, variable response, heart rate mostly in the 60s to 70s Personally viewed.    Cardiac Studies:  Bioprosthetic aortic valve, awake TEE this morning.  Assessment/Plan:  Principal Problem:   Bacteremia due to group B Streptococcus Active Problems:   AODM   HYPERLIPIDEMIA   OBESITY, MORBID   SLEEP APNEA, OBSTRUCTIVE, MILD   HYPERTENSION, CONTROLLED   PAROXYSMAL ATRIAL FIBRILLATION   ATHEROSCLEROTIC CARDIOVASCULAR DISEASE   AORTIC VALVE REPLACEMENT, HX OF   CHF (congestive heart failure)   Poor dentition   S/P total knee arthroplasty   Recurrent  cellulitis of lower leg  79 year old with atrial flutter admitted with rapid ventricular response, 1-1 conduction, with multiple comorbidities including diabetes, obesity, hypertension with coronary artery disease status post bypass/bioprosthetic aortic valve for aortic stenosis.  1. Atrial flutter-appreciate consultation from electrophysiology team. Carvedilol has been decreased. Continued diltiazem at 60 every 6. Amiodarone has also been decreased to 100 mg twice a day. She has had prior bradycardia with higher dose medication/amiodarone. Agree that not candidate for pacemaker at this time given bacteremia.   2. Group B strep bacteremia - appreciate ID consult, hospitalist assistance. Ceftriaxone IV. Per ID, will need PICC line once second set of blood cultures are negative.   3. Diabetes-stable appreciate hospitalist assistance.   4. Leukocytosis-originally 20,000 now normal.   5. Diastolic heart failure/acute on chronic-continued with diuresis. Weight 101.1 down to 99.1 kg.  6. Mildly elevated troponin-secondary to tachycardia on admission.   7. CKD stage 3 - monitor.   Plan is for TEE this morning to evaluate bioprosthetic aortic valve. Possibility of endocarditis.    SKAINS, MARK 02/07/2013, 8:55 AM

## 2013-02-07 NOTE — Progress Notes (Signed)
[facility] PROGRESS NOTE  [name] [facility] NWG:[id number] DOB: [birth year] DOA: 02/02/2013 PCP: [name], MD  Assessment/Plan: Principal Problem:   Bacteremia due to group B Streptococcus Active Problems:   AODM   HYPERLIPIDEMIA   OBESITY, MORBID   SLEEP APNEA, OBSTRUCTIVE, MILD   HYPERTENSION, CONTROLLED   PAROXYSMAL ATRIAL FIBRILLATION   ATHEROSCLEROTIC CARDIOVASCULAR DISEASE   AORTIC VALVE REPLACEMENT, HX OF   CHF (congestive heart failure)   Poor dentition   S/P total knee arthroplasty   Recurrent cellulitis of lower leg    Consult Note FU  Brief Narrative:  79 year old female with CAD/CABG, Aortic valve replacement bioprosthetic, LBBB, P.atrial flutter on chronic low-dose amiodarone, diastolic heart failure, morbid obesity, diabetes woke up on day of admission and did not feel well. She felt as though her heart was pounding and she was diaphoretic.  She came to the ER was noted to be in Aflutter with RVR, and leukocytosis. SHe was admitted by Dr.Skains and loaded with amiodarone, and started on CArdizem also was seen By [facility] in consultation for leukocytosis.  She had blood Cx drawn on admission, which grew Group B streptococcus, Is getting work up to r/o endocarditis, TEE pending    Assessment/Plan:  1. Group B Strep Bacteremia -started on IV Ceftriaxone 5/11, was on Vancomcyin for 1 day prior  -concern for endocarditis given no other source of infection and Aortic bioprosthetic valve  -repeated Blood Cx x2 on 5/11: NGTD  -TTE unremarkable  -TEE unremarkable  -Appreciate ID consult  Currently on Rocephin day 5   2. Mildly elevated LFTS  -likely secondary to Amiodarone  -USG benign    3. DM:  -continue lantus, SSI    4. Aflutter/Diastolic CHF  -amio/diuresis per cards  Thank you for the consult, we will follow        HPI/Subjective: Feeling well no chest pain, no shortness of breath  Objective: Filed Vitals:   02/07/13 0950 02/07/13 0955  02/07/13 1000 02/07/13 1005  BP: 144/56 132/66 136/67 108/61  Pulse:      Temp:      TempSrc:      Resp: 17 15 23 24   Height:      Weight:      SpO2: 100% 99% 97% 97%    Intake/Output Summary (Last 24 hours) at 02/07/13 1016 Last data filed at 02/07/13 0809  Gross per 24 hour  Intake 1024.49 ml  Output   3700 ml  Net -2675.51 ml    Exam:  HENT:  General: Well developed, well nourished, in no acute distress.  Head: Normocephalic and atraumatic. Alopecia  Lungs: Clear to auscultation and percussion.  Heart: Normal S1 and S2, frequent ectopy. 2/6 systolic murmur right upper sternal border, no rubs or gallops.  Abdomen: soft, non-tender, positive bowel sounds. Obese  Extremities: No clubbing or cyanosis. Trace edema.  Neurologic: Alert and oriented x 3.    Data Reviewed: Basic Metabolic Panel:  Recent Labs Lab 02/02/13 2035 02/03/13 0157 02/04/13 0450 02/05/13 0510 02/06/13 0430  NA 136 133*  136 131* 133* 134*  K 4.1 3.9  3.6 3.8 4.0 3.5  CL 98 98  100 96 99 98  CO2 26 25  26 23 24 27   GLUCOSE 281* 281*  205* 148* 164* 139*  BUN 29* 30*  32* 39* 44* 37*  CREATININE 1.27* 1.24*  1.25* 1.48* 1.56* 1.38*  CALCIUM 8.7 8.7  8.7 8.8 9.3 9.4    Liver Function Tests:  Recent Labs Lab 02/02/13 1624 02/02/13  2035 02/03/13 0157  AST 71* 77* 56*  ALT 33 44* 39*  ALKPHOS 158* 130* 120*  BILITOT 1.5* 1.6* 1.6*  PROT 8.6* 7.1 6.8  ALBUMIN 4.2 3.5 3.1*   No results found for this basename: LIPASE, AMYLASE,  in the last 168 hours No results found for this basename: AMMONIA,  in the last 168 hours  CBC:  Recent Labs Lab 02/03/13 0150 02/04/13 0450 02/05/13 0510 02/06/13 0430 02/07/13 0515  WBC 21.0* 15.0* 7.4 6.6 6.4  HGB 12.7 11.9* 10.9* 10.8* 11.6*  HCT 37.7 35.8* 32.7* 32.3* 34.9*  MCV 92.4 92.5 91.9 92.6 91.8  PLT 120* 104* 113* 123* 130*    Cardiac Enzymes:  Recent Labs Lab 02/02/13 2035 02/03/13 0145 02/03/13 0805  TROPONINI 0.51*  0.69* 0.63*   BNP (last 3 results)  Recent Labs  07/28/12 1536 02/02/13 2035 02/04/13 1050  PROBNP 5777.0* 4802.0* 23347.0*     CBG:  Recent Labs Lab 02/05/13 2137 02/06/13 0551 02/06/13 1608 02/06/13 2039 02/07/13 0606  GLUCAP 264* 130* 303* 109* 178*    Recent Results (from the past 240 hour(s))  CULTURE, BLOOD (ROUTINE X 2)     Status: None   Collection Time    02/02/13  6:39 PM      Result Value Range Status   Specimen Description BLOOD ARM LEFT   Final   Special Requests BOTTLES DRAWN AEROBIC AND ANAEROBIC 10CC   Final   Culture  Setup Time 02/03/2013 05:05   Final   Culture     Final   Value: GROUP B STREP(S.AGALACTIAE)ISOLATED     Note: Gram Stain Report Called to,Read Back By and Verified With: TINA WOODS 02/03/13 @ 2:29PM BY RUSCA.   Report Status 02/05/2013 FINAL   Final   Organism ID, Bacteria GROUP B STREP(S.AGALACTIAE)ISOLATED   Final  CULTURE, BLOOD (ROUTINE X 2)     Status: None   Collection Time    02/02/13  6:46 PM      Result Value Range Status   Specimen Description BLOOD HAND LEFT   Final   Special Requests BOTTLES DRAWN AEROBIC AND ANAEROBIC 10CC   Final   Culture  Setup Time 02/03/2013 05:05   Final   Culture     Final   Value: GROUP B STREP(S.AGALACTIAE)ISOLATED     Note: SUSCEPTIBILITIES PERFORMED ON PREVIOUS CULTURE WITHIN THE LAST 5 DAYS.     Note: Gram Stain Report Called to,Read Back By and Verified With: TINA WOODS 02/03/13 @ 2:29PM BY RUSCA.   Report Status 02/05/2013 FINAL   Final  URINE CULTURE     Status: None   Collection Time    02/03/13  6:47 AM      Result Value Range Status   Specimen Description URINE, RANDOM   Final   Special Requests NONE   Final   Culture  Setup Time 02/03/2013 13:28   Final   Colony Count 30,000 COLONIES/ML   Final   Culture     Final   Value: Multiple bacterial morphotypes present, none predominant. Suggest appropriate recollection if clinically indicated.   Report Status 02/04/2013 FINAL   Final   CULTURE, BLOOD (ROUTINE X 2)     Status: None   Collection Time    02/04/13 10:50 AM      Result Value Range Status   Specimen Description BLOOD LEFT ARM   Final   Special Requests BOTTLES DRAWN AEROBIC AND ANAEROBIC 10CC   Final   Culture  Setup Time 02/04/2013 19:22  Final   Culture     Final   Value:        BLOOD CULTURE RECEIVED NO GROWTH TO DATE CULTURE WILL BE HELD FOR 5 DAYS BEFORE ISSUING A FINAL NEGATIVE REPORT   Report Status PENDING   Incomplete  CULTURE, BLOOD (ROUTINE X 2)     Status: None   Collection Time    02/04/13 10:58 AM      Result Value Range Status   Specimen Description BLOOD LEFT HAND   Final   Special Requests BOTTLES DRAWN AEROBIC ONLY 10CC   Final   Culture  Setup Time 02/04/2013 19:23   Final   Culture     Final   Value:        BLOOD CULTURE RECEIVED NO GROWTH TO DATE CULTURE WILL BE HELD FOR 5 DAYS BEFORE ISSUING A FINAL NEGATIVE REPORT   Report Status PENDING   Incomplete  CULTURE, BLOOD (ROUTINE X 2)     Status: None   Collection Time    02/04/13  3:11 PM      Result Value Range Status   Specimen Description BLOOD RIGHT HAND   Final   Special Requests BOTTLES DRAWN AEROBIC ONLY 8CC   Final   Culture  Setup Time 02/04/2013 21:42   Final   Culture     Final   Value:        BLOOD CULTURE RECEIVED NO GROWTH TO DATE CULTURE WILL BE HELD FOR 5 DAYS BEFORE ISSUING A FINAL NEGATIVE REPORT   Report Status PENDING   Incomplete  CULTURE, BLOOD (ROUTINE X 2)     Status: None   Collection Time    02/04/13  3:18 PM      Result Value Range Status   Specimen Description BLOOD LEFT ARM   Final   Special Requests BOTTLES DRAWN AEROBIC ONLY 10CC   Final   Culture  Setup Time 02/04/2013 21:42   Final   Culture     Final   Value:        BLOOD CULTURE RECEIVED NO GROWTH TO DATE CULTURE WILL BE HELD FOR 5 DAYS BEFORE ISSUING A FINAL NEGATIVE REPORT   Report Status PENDING   Incomplete     Studies: US Abdomen Complete  02/03/2013   *RADIOLOGY REPORT*  Clinical  Data:  Abnormal liver function tests.  COMPLETE ABDOMINAL ULTRASOUND  Comparison:  None.  Findings:  Gallbladder:  Removed.  Common bile duct:  Measures 0.8 cm.  Liver:  No focal lesion identified.  Within normal limits in parenchymal echogenicity.  IVC:  Appears normal.  Pancreas:  No focal abnormality seen.  Spleen:  Measures 10.0 cm and appears normal.  Right Kidney:  Measures 9.1 cm and demonstrate some cortical thinning.  Left Kidney:  Measures 9.1 cm and demonstrate some cortical thinning.  Abdominal aorta:  Negative for aneurysm.  IMPRESSION: No acute finding or abnormality to explain elevated liver function tests.  Status post cholecystectomy.   Original Report Authenticated By: Holley Dexter, M.D.   Dg Chest Portable 1 View  02/02/2013   *RADIOLOGY REPORT*  Clinical Data: Shortness of breath and tachycardia.  PORTABLE CHEST - 1 VIEW  Comparison: 07/28/2012 and prior chest radiographs  Findings: Cardiomegaly with pulmonary vascular congestion noted. Mild chronic interstitial opacities are again noted. A defibrillator pad overlying the left chest is noted. Evidence of previous cardiac surgery/valve replacement noted. There is no evidence of focal airspace disease, suspicious pulmonary nodule/mass, pleural effusion, or pneumothorax. No acute bony abnormalities  are identified.  IMPRESSION: Cardiomegaly with pulmonary vascular congestion and possible minimal interstitial pulmonary edema.   Original Report Authenticated By: Harmon Pier, M.D.    Scheduled Meds: . [facility] HOLD] amiodarone  100 mg Oral BID  . [facility] HOLD] aspirin EC  81 mg Oral Daily  . [facility] HOLD] carvedilol  3.125 mg Oral BID WC  . [[facility] HOLD] cefTRIAXone (ROCEPHIN)  IV  2 g Intravenous Q24H  . [facility] HOLD] diltiazem  60 mg Oral QID  . [[facility] HOLD] donepezil  10 mg Oral QHS  . [facility] HOLD] ferrous sulfate  325 mg Oral BID  . [facility] HOLD] furosemide  100 mg Intravenous Q12H  . [facility] HOLD] gabapentin  300 mg Oral q morning - 10a  . [facility] HOLD]  gabapentin  600 mg Oral QHS  . [[facility] HOLD] insulin aspart  0-15 Units Subcutaneous TID WC  . [[facility] HOLD] insulin glargine  24 Units Subcutaneous QHS  . [[facility] HOLD] PARoxetine  40 mg Oral Daily  . [facility] HOLD] potassium chloride SA  20 mEq Oral TID  . [facility] HOLD] simvastatin  10 mg Oral QHS  . [[facility] HOLD] sodium chloride  3 mL Intravenous Q12H  . [facility] HOLD] spironolactone  12.5 mg Oral BID   Continuous Infusions: . sodium chloride 1,000 mL (02/02/13 1615)  . sodium chloride    . sodium chloride    . heparin 2,050 Units/hr (02/07/13 0809)    Principal Problem:   Bacteremia due to group B Streptococcus Active Problems:   AODM   HYPERLIPIDEMIA   OBESITY, MORBID   SLEEP APNEA, OBSTRUCTIVE, MILD   HYPERTENSION, CONTROLLED   PAROXYSMAL ATRIAL FIBRILLATION   ATHEROSCLEROTIC CARDIOVASCULAR DISEASE   AORTIC VALVE REPLACEMENT, HX OF   CHF (congestive heart failure)   Poor dentition   S/P total knee arthroplasty   Recurrent cellulitis of lower leg    Time spent: 40 minutes   [facility]  [facility] Pager [phone]. If 8PM-8AM, please contact night-coverage at [email or web address], password [email or web address] 02/07/2013, 10:16 AM  LOS: 5 days

## 2013-02-07 NOTE — Progress Notes (Signed)
ANTICOAGULATION CONSULT NOTE - Follow Up Consult  Pharmacy Consult:  Heparin Indication: Atrial fibrillation  Patient Measurements: Height: 5' 1.81" (157 cm) Weight: 218 lb 9.6 oz (99.156 kg) (a scale) IBW/kg (Calculated) : 49.67 Heparin Dosing Weight: 73 kg  Vital Signs: Temp: 98.8 F (37.1 C) (05/14 1015) Temp src: Oral (05/14 1015) BP: 134/58 mmHg (05/14 1020) Pulse Rate: 69 (05/14 0856)  Labs:  Recent Labs  02/05/13 0510  02/06/13 0430 02/06/13 1452 02/07/13 0515  HGB 10.9*  --  10.8*  --  11.6*  HCT 32.7*  --  32.3*  --  34.9*  PLT 113*  --  123*  --  130*  HEPARINUNFRC  --   < > 0.17* 0.51 0.51  CREATININE 1.56*  --  1.38*  --   --   < > = values in this interval not displayed.  Estimated Creatinine Clearance: 37.5 ml/min (by C-G formula based on Cr of 1.38).   Assessment: [name] continues on IV heparin for atrial fibrillation. Heparin level this am is 0.51 which is therapeutic after heparin IV drip rate increased to 2050 units/hr yesterday. No bleeding noted.  This AM the hemoglobin is relatively stable and her platelets are improving.  TEE performed this am did not show vegetations or clot, plan is for dccv later today.  Goal of Therapy:  Heparin level 0.3-0.7 units/ml Monitor platelets by anticoagulation protocol: Yes    Plan:  Continue heparin gtt at 2050 units/hr Daily AM HL and CBC.   Sheppard Coil PharmD., BCPS Clinical Pharmacist Pager [phone] 02/07/2013 11:08 AM

## 2013-02-07 NOTE — Interval H&P Note (Signed)
History and Physical Interval Note:  02/07/2013 9:53 AM  [name]  has presented today for surgery, with the diagnosis of endo card  The various methods of treatment have been discussed with the patient and family. After consideration of risks, benefits and other options for treatment, the patient has consented to  Procedure(s): TRANSESOPHAGEAL ECHOCARDIOGRAM (TEE) (N/A) as a surgical intervention .  The patient's history has been reviewed, patient examined, no change in status, stable for surgery.  I have reviewed the patient's chart and labs.  Questions were answered to the patient's satisfaction.     Sukhman Kocher

## 2013-02-08 ENCOUNTER — Encounter (HOSPITAL_COMMUNITY): Payer: Self-pay | Admitting: Cardiology

## 2013-02-08 LAB — COMPREHENSIVE METABOLIC PANEL
ALT: 23 U/L (ref 0–35)
AST: 28 U/L (ref 0–37)
CO2: 29 mEq/L (ref 19–32)
Calcium: 10 mg/dL (ref 8.4–10.5)
Chloride: 95 mEq/L — ABNORMAL LOW (ref 96–112)
GFR calc Af Amer: 47 mL/min — ABNORMAL LOW (ref >90)
GFR calc non Af Amer: 40 mL/min — ABNORMAL LOW (ref >90)
Glucose, Bld: 221 mg/dL — ABNORMAL HIGH (ref 70–99)
Sodium: 136 mEq/L (ref 135–145)
Total Bilirubin: 0.6 mg/dL (ref 0.3–1.2)

## 2013-02-08 LAB — CBC
Hemoglobin: 12.9 g/dL (ref 12.0–15.0)
MCH: 31.2 pg (ref 26.0–34.0)
MCHC: 33.7 g/dL (ref 30.0–36.0)
MCV: 92.7 fL (ref 78.0–100.0)
Platelets: 153 10*3/uL (ref 150–400)
RBC: 4.13 MIL/uL (ref 3.87–5.11)

## 2013-02-08 LAB — GLUCOSE, CAPILLARY
Glucose-Capillary: 170 mg/dL — ABNORMAL HIGH (ref 70–99)
Glucose-Capillary: 197 mg/dL — ABNORMAL HIGH (ref 70–99)

## 2013-02-08 LAB — HEPARIN LEVEL (UNFRACTIONATED): Heparin Unfractionated: 0.64 IU/mL (ref 0.30–0.70)

## 2013-02-08 MED ORDER — METOPROLOL TARTRATE 50 MG PO TABS
50.0000 mg | ORAL_TABLET | Freq: Two times a day (BID) | ORAL | Status: DC
  Administered 2013-02-08 – 2013-02-09 (×2): 50 mg via ORAL
  Filled 2013-02-08 (×3): qty 1

## 2013-02-08 MED ORDER — HEPARIN (PORCINE) IN NACL 100-0.45 UNIT/ML-% IJ SOLN
1950.0000 [IU]/h | INTRAMUSCULAR | Status: DC
  Administered 2013-02-08 – 2013-02-09 (×2): 2000 [IU]/h via INTRAVENOUS
  Filled 2013-02-08: qty 250

## 2013-02-08 MED ORDER — RIFAMPIN 300 MG PO CAPS
300.0000 mg | ORAL_CAPSULE | Freq: Every day | ORAL | Status: DC
  Administered 2013-02-08 – 2013-02-09 (×2): 300 mg via ORAL
  Filled 2013-02-08 (×2): qty 1

## 2013-02-08 NOTE — Progress Notes (Signed)
INFECTIOUS DISEASE PROGRESS NOTE  ID: [name] is a 79 y.o. female with   Principal Problem:   Bacteremia due to group B Streptococcus Active Problems:   AODM   HYPERLIPIDEMIA   OBESITY, MORBID   SLEEP APNEA, OBSTRUCTIVE, MILD   HYPERTENSION, CONTROLLED   PAROXYSMAL ATRIAL FIBRILLATION   ATHEROSCLEROTIC CARDIOVASCULAR DISEASE   AORTIC VALVE REPLACEMENT, HX OF   CHF (congestive heart failure)   Poor dentition   S/P total knee arthroplasty   Recurrent cellulitis of lower leg  Subjective: Without complaints  Abtx:  Anti-infectives   Start     Dose/Rate Route Frequency Ordered Stop   02/04/13 1600  cefTRIAXone (ROCEPHIN) 2 g in dextrose 5 % 50 mL IVPB     2 g 100 mL/hr over 30 Minutes Intravenous Every 24 hours 02/04/13 1440     02/03/13 1700  vancomycin (VANCOCIN) 1,250 mg in sodium chloride 0.9 % 250 mL IVPB  Status:  Discontinued     1,250 mg 166.7 mL/hr over 90 Minutes Intravenous Every 24 hours 02/03/13 1635 02/04/13 1440      Medications:  Scheduled: . amiodarone  100 mg Oral BID  . aspirin EC  81 mg Oral Daily  . carvedilol  3.125 mg Oral BID WC  . cefTRIAXone (ROCEPHIN)  IV  2 g Intravenous Q24H  . diltiazem  60 mg Oral QID  . donepezil  10 mg Oral QHS  . ferrous sulfate  325 mg Oral BID  . furosemide  100 mg Intravenous Q12H  . gabapentin  300 mg Oral q morning - 10a  . gabapentin  600 mg Oral QHS  . insulin aspart  0-15 Units Subcutaneous TID WC  . insulin glargine  24 Units Subcutaneous QHS  . PARoxetine  40 mg Oral Daily  . potassium chloride SA  20 mEq Oral TID  . simvastatin  10 mg Oral QHS  . sodium chloride  3 mL Intravenous Q12H  . spironolactone  12.5 mg Oral BID    Objective: Vital signs in last 24 hours: Temp:  [97.7 F (36.5 C)-98.2 F (36.8 C)] 97.8 F (36.6 C) (05/15 1411) Pulse Rate:  [67-88] 88 (05/15 1411) Resp:  [16-20] 20 (05/15 1411) BP: (108-140)/(53-75) 129/66 mmHg (05/15 1411) SpO2:  [92 %-95 %] 95 % (05/15  1411) Weight:  [99.474 kg (219 lb 4.8 oz)] 99.474 kg (219 lb 4.8 oz) (05/15 0655)   General appearance: alert, cooperative and no distress Resp: clear to auscultation bilaterally Cardio: irregularly irregular rhythm GI: normal findings: bowel sounds normal and soft, non-tender  Lab Results  Recent Labs  02/06/13 0430 02/07/13 0515 02/08/13 0500 02/08/13 0923  WBC 6.6 6.4 7.7  --   HGB 10.8* 11.6* 12.9  --   HCT 32.3* 34.9* 38.3  --   NA 134*  --   --  136  K 3.5  --   --  4.0  CL 98  --   --  95*  CO2 27  --   --  29  BUN 37*  --   --  23  CREATININE 1.38*  --   --  1.25*   Liver Panel  Recent Labs  02/08/13 0923  PROT 7.9  ALBUMIN 3.3*  AST 28  ALT 23  ALKPHOS 115  BILITOT 0.6   Sedimentation Rate No results found for this basename: ESRSEDRATE,  in the last 72 hours C-Reactive Protein No results found for this basename: CRP,  in the last 72 hours  Microbiology: Recent Results (from the past 240 hour(s))  CULTURE, BLOOD (ROUTINE X 2)     Status: None   Collection Time    02/02/13  6:39 PM      Result Value Range Status   Specimen Description BLOOD ARM LEFT   Final   Special Requests BOTTLES DRAWN AEROBIC AND ANAEROBIC 10CC   Final   Culture  Setup Time 02/03/2013 05:05   Final   Culture     Final   Value: GROUP B STREP(S.AGALACTIAE)ISOLATED     Note: Gram Stain Report Called to,Read Back By and Verified With: TINA WOODS 02/03/13 @ 2:29PM BY RUSCA.   Report Status 02/05/2013 FINAL   Final   Organism ID, Bacteria GROUP B STREP(S.AGALACTIAE)ISOLATED   Final  CULTURE, BLOOD (ROUTINE X 2)     Status: None   Collection Time    02/02/13  6:46 PM      Result Value Range Status   Specimen Description BLOOD HAND LEFT   Final   Special Requests BOTTLES DRAWN AEROBIC AND ANAEROBIC 10CC   Final   Culture  Setup Time 02/03/2013 05:05   Final   Culture     Final   Value: GROUP B STREP(S.AGALACTIAE)ISOLATED     Note: SUSCEPTIBILITIES PERFORMED ON PREVIOUS CULTURE  WITHIN THE LAST 5 DAYS.     Note: Gram Stain Report Called to,Read Back By and Verified With: TINA WOODS 02/03/13 @ 2:29PM BY RUSCA.   Report Status 02/05/2013 FINAL   Final  URINE CULTURE     Status: None   Collection Time    02/03/13  6:47 AM      Result Value Range Status   Specimen Description URINE, RANDOM   Final   Special Requests NONE   Final   Culture  Setup Time 02/03/2013 13:28   Final   Colony Count 30,000 COLONIES/ML   Final   Culture     Final   Value: Multiple bacterial morphotypes present, none predominant. Suggest appropriate recollection if clinically indicated.   Report Status 02/04/2013 FINAL   Final  CULTURE, BLOOD (ROUTINE X 2)     Status: None   Collection Time    02/04/13 10:50 AM      Result Value Range Status   Specimen Description BLOOD LEFT ARM   Final   Special Requests BOTTLES DRAWN AEROBIC AND ANAEROBIC 10CC   Final   Culture  Setup Time 02/04/2013 19:22   Final   Culture     Final   Value:        BLOOD CULTURE RECEIVED NO GROWTH TO DATE CULTURE WILL BE HELD FOR 5 DAYS BEFORE ISSUING A FINAL NEGATIVE REPORT   Report Status PENDING   Incomplete  CULTURE, BLOOD (ROUTINE X 2)     Status: None   Collection Time    02/04/13 10:58 AM      Result Value Range Status   Specimen Description BLOOD LEFT HAND   Final   Special Requests BOTTLES DRAWN AEROBIC ONLY 10CC   Final   Culture  Setup Time 02/04/2013 19:23   Final   Culture     Final   Value:        BLOOD CULTURE RECEIVED NO GROWTH TO DATE CULTURE WILL BE HELD FOR 5 DAYS BEFORE ISSUING A FINAL NEGATIVE REPORT   Report Status PENDING   Incomplete  CULTURE, BLOOD (ROUTINE X 2)     Status: None   Collection Time    02/04/13  3:11 PM  Result Value Range Status   Specimen Description BLOOD RIGHT HAND   Final   Special Requests BOTTLES DRAWN AEROBIC ONLY 8CC   Final   Culture  Setup Time 02/04/2013 21:42   Final   Culture     Final   Value:        BLOOD CULTURE RECEIVED NO GROWTH TO DATE CULTURE WILL BE  HELD FOR 5 DAYS BEFORE ISSUING A FINAL NEGATIVE REPORT   Report Status PENDING   Incomplete  CULTURE, BLOOD (ROUTINE X 2)     Status: None   Collection Time    02/04/13  3:18 PM      Result Value Range Status   Specimen Description BLOOD LEFT ARM   Final   Special Requests BOTTLES DRAWN AEROBIC ONLY 10CC   Final   Culture  Setup Time 02/04/2013 21:42   Final   Culture     Final   Value:        BLOOD CULTURE RECEIVED NO GROWTH TO DATE CULTURE WILL BE HELD FOR 5 DAYS BEFORE ISSUING A FINAL NEGATIVE REPORT   Report Status PENDING   Incomplete    Studies/Results: No results found.   Assessment/Plan: GBS bacteremia  AVR (tissue)  Bilateral TKR  Total days of antibiotics: 6 (ceftriaxone)  TEE (-)   Would continue current anbx and add rifampin.  Await repeat BCx are negative to date.  Would place pic and treat her for 4 weeks (including her 6 days so far, therefore 22 more days).  This is a long course with her negative studies, however she is very HIGH risk with AVR and TKRs.  would stop statin Appreciate pharmacy help with drug interactions.  She needs repeat BCx 1 week after she completes her anbx  Happy to see in f/u in ID if needed, she would like to f/u with Dr Cathe [facility] [phone] 02/08/2013, 4:44 PM   LOS: 6 days

## 2013-02-08 NOTE — Evaluation (Signed)
Physical Therapy Evaluation Patient Details Name: [name] MRN: [id number] DOB: 1933/04/29 Today's Date: 02/08/2013 Time: 1625-1700 PT Time Calculation (min): 35 min  PT Assessment / Plan / Recommendation Clinical Impression  Pt is a 79 yo female with CHF and bacteremia. Pt w/ generalized deconditioning causing increased transfer time and decreased ambulation tolerance. Pt with good transfer technique and awareness of deficits. Pt would benefit from HHPT for d/c home to address limited activity endurance and improve overall mobility. Pt will require 24/7 supervision for OOB activity and assistance for ADLs on return home.    PT Assessment  Patient needs continued PT services    Follow Up Recommendations  Home health PT;Supervision/Assistance - 24 hour    Does the patient have the potential to tolerate intense rehabilitation      Barriers to Discharge        Equipment Recommendations       Recommendations for Other Services     Frequency Min 3X/week    Precautions / Restrictions Precautions Precautions: Fall Restrictions Weight Bearing Restrictions: No   Pertinent Vitals/Pain Pt denies pain but reports discomfort at IV site on L arm      Mobility  Bed Mobility Bed Mobility: Rolling Right;Right Sidelying to Sit;Sitting - Scoot to Edge of Bed Rolling Right: 4: Min assist;With rail Right Sidelying to Sit: 4: Min assist;HOB elevated;With rails Sitting - Scoot to Edge of Bed: 5: Supervision Details for Bed Mobility Assistance: Pt reports some discomfort with IV in L arm which impedes her bed mobility Transfers Transfers: Sit to Stand;Stand to Sit Sit to Stand: 3: Mod assist;From bed;From chair/3-in-1 (from bed x1, from 3in1 x1) Stand to Sit: 4: Min assist;To chair/3-in-1;With upper extremity assist (to 3in1 x1, to chair x1 ) Details for Transfer Assistance: Pt requires A to lift trunk and to descend in a slow controlled manner Ambulation/Gait Ambulation/Gait  Assistance: 4: Min guard Ambulation Distance (Feet): 50 Feet Assistive device: Rolling walker Ambulation/Gait Assistance Details: Pt reports she feels like she is walking better than PTA. Pt with min gaurd for safety Gait Pattern: Decreased stride length;Step-through pattern Gait velocity: slow General Gait Details: Pt with HR at 105 w/ ambulation, requesting to turn around after 25 ft of ambulation Stairs: No Wheelchair Mobility Wheelchair Mobility: No    Exercises     PT Diagnosis: Difficulty walking;Generalized weakness  PT Problem List: Decreased strength;Decreased activity tolerance;Decreased balance;Decreased mobility;Decreased coordination PT Treatment Interventions: Gait training;Stair training;Functional mobility training;Therapeutic activities;Therapeutic exercise;Balance training   PT Goals Acute Rehab PT Goals PT Goal Formulation: With patient Time For Goal Achievement: 02/22/13 Potential to Achieve Goals: Good Pt will go Supine/Side to Sit: with modified independence;with HOB 0 degrees PT Goal: Supine/Side to Sit - Progress: Goal set today Pt will go Sit to Stand: with modified independence;with upper extremity assist (to RW) PT Goal: Sit to Stand - Progress: Goal set today Pt will Transfer Bed to Chair/Chair to Bed: with modified independence (w/ R/W) PT Transfer Goal: Bed to Chair/Chair to Bed - Progress: Goal set today Pt will Ambulate: 16 - 50 feet;with modified independence;with least restrictive assistive device PT Goal: Ambulate - Progress: Goal set today  Visit Information  Last PT Received On: 02/08/13 Assistance Needed: +1    Subjective Data  Subjective: pt recieved in bed, agreeable to PT   Prior Functioning  Home Living Lives With: Daughter (Son in Social worker) Available Help at Discharge: Family;Available 24 hours/day (HH aid 3x/week; Aid helps with light housework & bathing) Type of Home:  House Home Access: Ramped entrance Home Layout: One  level Bathroom Shower/Tub: Engineer, manufacturing systems: Standard (Has raised toliet seat but doesnt use it) Bathroom Accessibility: Yes How Accessible: Accessible via walker Home Adaptive Equipment: Tub transfer bench;Walker - rolling Prior Function Level of Independence: Needs assistance Needs Assistance: Bathing Bath: Minimal Driving: No Vocation: Retired Comments: PTA, pt enjoyed doing Pharmacologist and going out to eat. Pt reports she sleeps in a Surveyor, minerals Communication: No difficulties Dominant Hand: Right    Cognition  Cognition Arousal/Alertness: Awake/alert Behavior During Therapy: WFL for tasks assessed/performed Overall Cognitive Status: Within Functional Limits for tasks assessed    Extremity/Trunk Assessment Right Upper Extremity Assessment RUE ROM/Strength/Tone: [facility] for tasks assessed Left Upper Extremity Assessment LUE ROM/Strength/Tone: WFL for tasks assessed Right Lower Extremity Assessment RLE ROM/Strength/Tone: [facility] for tasks assessed Left Lower Extremity Assessment LLE ROM/Strength/Tone: [facility] for tasks assessed   Balance Balance Balance Assessed: Yes Static Sitting Balance Static Sitting - Balance Support: Feet unsupported;No upper extremity supported Static Sitting - Level of Assistance: 5: Stand by assistance Static Sitting - Comment/# of Minutes: 5 mins during gait preparation and history taking Dynamic Standing Balance Dynamic Standing - Balance Support: During functional activity Dynamic Standing - Level of Assistance: 5: Stand by assistance Dynamic Standing - Balance Activities:  (wiping after voiding) Dynamic Standing - Comments: 1 min, pt SBA for safety  End of Session PT - End of Session Equipment Utilized During Treatment: Gait belt Activity Tolerance: Patient tolerated treatment well Patient left: in chair;with call bell/phone within reach;with family/visitor present  GP     02/08/2013, 5:28 PM Marvis Moeller, Student  Physical Therapist Office #: [phone]

## 2013-02-08 NOTE — Progress Notes (Signed)
[facility] PROGRESS NOTE  [name] WGN:[id number] DOB: 05/08/1933 DOA: 02/02/2013 PCP: [name], MD  Assessment/Plan: Principal Problem:   Bacteremia due to group B Streptococcus Active Problems:   AODM   HYPERLIPIDEMIA   OBESITY, MORBID   SLEEP APNEA, OBSTRUCTIVE, MILD   HYPERTENSION, CONTROLLED   PAROXYSMAL ATRIAL FIBRILLATION   ATHEROSCLEROTIC CARDIOVASCULAR DISEASE   AORTIC VALVE REPLACEMENT, HX OF   CHF (congestive heart failure)   Poor dentition   S/P total knee arthroplasty   Recurrent cellulitis of lower leg    Consult Note FU  Brief Narrative:  79 year old female with CAD/CABG, Aortic valve replacement bioprosthetic, LBBB, P.atrial flutter on chronic low-dose amiodarone, diastolic heart failure, morbid obesity, diabetes woke up on day of admission and did not feel well. She felt as though her heart was pounding and she was diaphoretic.  She came to the ER was noted to be in Aflutter with RVR, and leukocytosis. SHe was admitted by Dr.Skains and loaded with amiodarone, and started on CArdizem also was seen By [facility] in consultation for leukocytosis.  She had blood Cx drawn on admission, which grew Group B streptococcus, Is getting work up to r/o endocarditis, TEE pending    Assessment/Plan:  1. Group B Strep Bacteremia -started on IV Ceftriaxone 5/11, was on Vancomcyin for 1 day prior  -concern for endocarditis given no other source of infection and Aortic bioprosthetic valve  -repeated Blood Cx x2 on 5/11: NGTD  -TTE unremarkable  -TEE unremarkable  -Appreciate ID consult  Currently on Rocephin day 6  Would place pic and treat her for 4 weeks (including her 6 days so far, therefore 22 more days).  This is a long course with her negative studies, however she is very HIGH risk with AVR and TKRs.  Infectious disease would add rifampin 300mg  qday if ok with CV and her meds can be adjusted.  She needs repeat BCx 1 week after she completes her anbx The  pharmacy sticky note for interactions  2. Mildly elevated LFTS  -likely secondary to Amiodarone  -USG benign  Recheck LFTs today  3. DM:  -continue lantus, SSI   4. Aflutter/Diastolic CHF  -amio/diuresis per cards  Thank you for the consult, we will follow    HPI/Subjective:  Feeling well no chest pain, no shortness of breath      Objective: Filed Vitals:   02/07/13 1433 02/07/13 2100 02/08/13 0442 02/08/13 0655  BP: 112/70 108/53 130/75   Pulse: 70 67 73   Temp: 98 F (36.7 C) 98.2 F (36.8 C) 97.7 F (36.5 C)   TempSrc: Oral Oral Oral   Resp: 16 18 16    Height:      Weight:    99.474 kg (219 lb 4.8 oz)  SpO2: 97% 95% 92%     Intake/Output Summary (Last 24 hours) at 02/08/13 0834 Last data filed at 02/08/13 0604  Gross per 24 hour  Intake    480 ml  Output   2877 ml  Net  -2397 ml    Exam:  General: Well developed, well nourished, in no acute distress.  Head: Normocephalic and atraumatic. Alopecia  Lungs: Clear to auscultation and percussion.  Heart: Normal S1 and S2, frequent ectopy. 2/6 systolic murmur right upper sternal border, no rubs or gallops.  Abdomen: soft, non-tender, positive bowel sounds. Obese  Extremities: No clubbing or cyanosis. Trace edema.  Neurologic: Alert and oriented x 3.    Data Reviewed: Basic Metabolic Panel:  Recent Labs Lab 02/02/13 2035  02/03/13 0157 02/04/13 0450 02/05/13 0510 02/06/13 0430  NA 136 133*  136 131* 133* 134*  K 4.1 3.9  3.6 3.8 4.0 3.5  CL 98 98  100 96 99 98  CO2 26 25  26 23 24 27   GLUCOSE 281* 281*  205* 148* 164* 139*  BUN 29* 30*  32* 39* 44* 37*  CREATININE 1.27* 1.24*  1.25* 1.48* 1.56* 1.38*  CALCIUM 8.7 8.7  8.7 8.8 9.3 9.4    Liver Function Tests:  Recent Labs Lab 02/02/13 1624 02/02/13 2035 02/03/13 0157  AST 71* 77* 56*  ALT 33 44* 39*  ALKPHOS 158* 130* 120*  BILITOT 1.5* 1.6* 1.6*  PROT 8.6* 7.1 6.8  ALBUMIN 4.2 3.5 3.1*   No results found for this basename:  LIPASE, AMYLASE,  in the last 168 hours No results found for this basename: AMMONIA,  in the last 168 hours  CBC:  Recent Labs Lab 02/04/13 0450 02/05/13 0510 02/06/13 0430 02/07/13 0515 02/08/13 0500  WBC 15.0* 7.4 6.6 6.4 7.7  HGB 11.9* 10.9* 10.8* 11.6* 12.9  HCT 35.8* 32.7* 32.3* 34.9* 38.3  MCV 92.5 91.9 92.6 91.8 92.7  PLT 104* 113* 123* 130* 153    Cardiac Enzymes:  Recent Labs Lab 02/02/13 2035 02/03/13 0145 02/03/13 0805  TROPONINI 0.51* 0.69* 0.63*   BNP (last 3 results)  Recent Labs  07/28/12 1536 02/02/13 2035 02/04/13 1050  PROBNP 5777.0* 4802.0* 23347.0*     CBG:  Recent Labs Lab 02/07/13 0606 02/07/13 1112 02/07/13 1613 02/07/13 2054 02/08/13 0549  GLUCAP 178* 156* 261* 227* 170*    Recent Results (from the past 240 hour(s))  CULTURE, BLOOD (ROUTINE X 2)     Status: None   Collection Time    02/02/13  6:39 PM      Result Value Range Status   Specimen Description BLOOD ARM LEFT   Final   Special Requests BOTTLES DRAWN AEROBIC AND ANAEROBIC 10CC   Final   Culture  Setup Time 02/03/2013 05:05   Final   Culture     Final   Value: GROUP B STREP(S.AGALACTIAE)ISOLATED     Note: Gram Stain Report Called to,Read Back By and Verified With: TINA WOODS 02/03/13 @ 2:29PM BY RUSCA.   Report Status 02/05/2013 FINAL   Final   Organism ID, Bacteria GROUP B STREP(S.AGALACTIAE)ISOLATED   Final  CULTURE, BLOOD (ROUTINE X 2)     Status: None   Collection Time    02/02/13  6:46 PM      Result Value Range Status   Specimen Description BLOOD HAND LEFT   Final   Special Requests BOTTLES DRAWN AEROBIC AND ANAEROBIC 10CC   Final   Culture  Setup Time 02/03/2013 05:05   Final   Culture     Final   Value: GROUP B STREP(S.AGALACTIAE)ISOLATED     Note: SUSCEPTIBILITIES PERFORMED ON PREVIOUS CULTURE WITHIN THE LAST 5 DAYS.     Note: Gram Stain Report Called to,Read Back By and Verified With: TINA WOODS 02/03/13 @ 2:29PM BY RUSCA.   Report Status 02/05/2013  FINAL   Final  URINE CULTURE     Status: None   Collection Time    02/03/13  6:47 AM      Result Value Range Status   Specimen Description URINE, RANDOM   Final   Special Requests NONE   Final   Culture  Setup Time 02/03/2013 13:28   Final   Colony Count 30,000 COLONIES/ML   Final  Culture     Final   Value: Multiple bacterial morphotypes present, none predominant. Suggest appropriate recollection if clinically indicated.   Report Status 02/04/2013 FINAL   Final  CULTURE, BLOOD (ROUTINE X 2)     Status: None   Collection Time    02/04/13 10:50 AM      Result Value Range Status   Specimen Description BLOOD LEFT ARM   Final   Special Requests BOTTLES DRAWN AEROBIC AND ANAEROBIC 10CC   Final   Culture  Setup Time 02/04/2013 19:22   Final   Culture     Final   Value:        BLOOD CULTURE RECEIVED NO GROWTH TO DATE CULTURE WILL BE HELD FOR 5 DAYS BEFORE ISSUING A FINAL NEGATIVE REPORT   Report Status PENDING   Incomplete  CULTURE, BLOOD (ROUTINE X 2)     Status: None   Collection Time    02/04/13 10:58 AM      Result Value Range Status   Specimen Description BLOOD LEFT HAND   Final   Special Requests BOTTLES DRAWN AEROBIC ONLY 10CC   Final   Culture  Setup Time 02/04/2013 19:23   Final   Culture     Final   Value:        BLOOD CULTURE RECEIVED NO GROWTH TO DATE CULTURE WILL BE HELD FOR 5 DAYS BEFORE ISSUING A FINAL NEGATIVE REPORT   Report Status PENDING   Incomplete  CULTURE, BLOOD (ROUTINE X 2)     Status: None   Collection Time    02/04/13  3:11 PM      Result Value Range Status   Specimen Description BLOOD RIGHT HAND   Final   Special Requests BOTTLES DRAWN AEROBIC ONLY 8CC   Final   Culture  Setup Time 02/04/2013 21:42   Final   Culture     Final   Value:        BLOOD CULTURE RECEIVED NO GROWTH TO DATE CULTURE WILL BE HELD FOR 5 DAYS BEFORE ISSUING A FINAL NEGATIVE REPORT   Report Status PENDING   Incomplete  CULTURE, BLOOD (ROUTINE X 2)     Status: None   Collection Time     02/04/13  3:18 PM      Result Value Range Status   Specimen Description BLOOD LEFT ARM   Final   Special Requests BOTTLES DRAWN AEROBIC ONLY 10CC   Final   Culture  Setup Time 02/04/2013 21:42   Final   Culture     Final   Value:        BLOOD CULTURE RECEIVED NO GROWTH TO DATE CULTURE WILL BE HELD FOR 5 DAYS BEFORE ISSUING A FINAL NEGATIVE REPORT   Report Status PENDING   Incomplete     Studies: US Abdomen Complete  02/03/2013   *RADIOLOGY REPORT*  Clinical Data:  Abnormal liver function tests.  COMPLETE ABDOMINAL ULTRASOUND  Comparison:  None.  Findings:  Gallbladder:  Removed.  Common bile duct:  Measures 0.8 cm.  Liver:  No focal lesion identified.  Within normal limits in parenchymal echogenicity.  IVC:  Appears normal.  Pancreas:  No focal abnormality seen.  Spleen:  Measures 10.0 cm and appears normal.  Right Kidney:  Measures 9.1 cm and demonstrate some cortical thinning.  Left Kidney:  Measures 9.1 cm and demonstrate some cortical thinning.  Abdominal aorta:  Negative for aneurysm.  IMPRESSION: No acute finding or abnormality to explain elevated liver function tests.  Status post  cholecystectomy.   Original Report Authenticated By: Holley Dexter, M.D.   Dg Chest Portable 1 View  02/02/2013   *RADIOLOGY REPORT*  Clinical Data: Shortness of breath and tachycardia.  PORTABLE CHEST - 1 VIEW  Comparison: 07/28/2012 and prior chest radiographs  Findings: Cardiomegaly with pulmonary vascular congestion noted. Mild chronic interstitial opacities are again noted. A defibrillator pad overlying the left chest is noted. Evidence of previous cardiac surgery/valve replacement noted. There is no evidence of focal airspace disease, suspicious pulmonary nodule/mass, pleural effusion, or pneumothorax. No acute bony abnormalities are identified.  IMPRESSION: Cardiomegaly with pulmonary vascular congestion and possible minimal interstitial pulmonary edema.   Original Report Authenticated By: Harmon Pier,  M.D.    Scheduled Meds: . amiodarone  100 mg Oral BID  . aspirin EC  81 mg Oral Daily  . carvedilol  3.125 mg Oral BID WC  . cefTRIAXone (ROCEPHIN)  IV  2 g Intravenous Q24H  . diltiazem  60 mg Oral QID  . donepezil  10 mg Oral QHS  . ferrous sulfate  325 mg Oral BID  . furosemide  100 mg Intravenous Q12H  . gabapentin  300 mg Oral q morning - 10a  . gabapentin  600 mg Oral QHS  . insulin aspart  0-15 Units Subcutaneous TID WC  . insulin glargine  24 Units Subcutaneous QHS  . PARoxetine  40 mg Oral Daily  . potassium chloride SA  20 mEq Oral TID  . simvastatin  10 mg Oral QHS  . sodium chloride  3 mL Intravenous Q12H  . spironolactone  12.5 mg Oral BID   Continuous Infusions: . sodium chloride 1,000 mL (02/02/13 1615)  . heparin 2,050 Units/hr (02/07/13 2301)    Principal Problem:   Bacteremia due to group B Streptococcus Active Problems:   AODM   HYPERLIPIDEMIA   OBESITY, MORBID   SLEEP APNEA, OBSTRUCTIVE, MILD   HYPERTENSION, CONTROLLED   PAROXYSMAL ATRIAL FIBRILLATION   ATHEROSCLEROTIC CARDIOVASCULAR DISEASE   AORTIC VALVE REPLACEMENT, HX OF   CHF (congestive heart failure)   Poor dentition   S/P total knee arthroplasty   Recurrent cellulitis of lower leg    Time spent: 40 minutes   [facility]  [facility] Pager [phone]. If 8PM-8AM, please contact night-coverage at [email or web address], password [facility] 02/08/2013, 8:34 AM  LOS: 6 days

## 2013-02-08 NOTE — Progress Notes (Signed)
ID request Rifampin  Interacts with diltiazem. Pharm rec to change to metoprolol.   Will stop carvedilol 3.125mg  BID and diltiazem 60mg  Q6 Start metoprolol 50mg  PO BID tartrate.   Will monitor tele.  Once again, came in with 1:1 flutter at 216 bpm.   Appreciate Dr. Susie Cassette help with PICC, abx, home heath.

## 2013-02-08 NOTE — Progress Notes (Signed)
Subjective:  No complaints, sitting in chair. Legs are a bit sore.   Objective:  Vital Signs in the last 24 hours: Temp:  [97.7 F (36.5 C)-98.8 F (37.1 C)] 97.7 F (36.5 C) (05/15 0442) Pulse Rate:  [67-73] 73 (05/15 0442) Resp:  [12-24] 16 (05/15 0442) BP: (108-144)/(53-75) 130/75 mmHg (05/15 0442) SpO2:  [90 %-100 %] 92 % (05/15 0442) Weight:  [99.474 kg (219 lb 4.8 oz)] 99.474 kg (219 lb 4.8 oz) (05/15 0655)  Intake/Output from previous day: 05/14 0701 - 05/15 0700 In: 480 [P.O.:480] Out: 3677 [Urine:3675; Stool:2]   Physical Exam: General: Well developed, well nourished, in no acute distress.  Head: Normocephalic and atraumatic. Alopecia  Lungs: Clear to auscultation and percussion.  Heart: Normal S1 and S2, frequent ectopy. 2/6 systolic murmur right upper sternal border, no rubs or gallops.  Abdomen: soft, non-tender, positive bowel sounds. Obese  Extremities: No clubbing or cyanosis. Trace edema.  Neurologic: Alert and oriented x 3.     Lab Results:  Recent Labs  02/07/13 0515 02/08/13 0500  WBC 6.4 7.7  HGB 11.6* 12.9  PLT 130* 153    Recent Labs  02/06/13 0430  NA 134*  K 3.5  CL 98  CO2 27  GLUCOSE 139*  BUN 37*  CREATININE 1.38*   Telemetry: Aflutter, normal rate Personally viewed.  Cardiac Studies:  TEE no veg, normal tissue AV, mod pulm HTN  Assessment/Plan:  Principal Problem:   Bacteremia due to group B Streptococcus Active Problems:   AODM   HYPERLIPIDEMIA   OBESITY, MORBID   SLEEP APNEA, OBSTRUCTIVE, MILD   HYPERTENSION, CONTROLLED   PAROXYSMAL ATRIAL FIBRILLATION   ATHEROSCLEROTIC CARDIOVASCULAR DISEASE   AORTIC VALVE REPLACEMENT, HX OF   CHF (congestive heart failure)   Poor dentition   S/P total knee arthroplasty   Recurrent cellulitis of lower leg  79 year old with atrial flutter admitted with rapid ventricular response, 1-1 conduction, with multiple comorbidities including diabetes, obesity, hypertension with coronary  artery disease status post bypass/bioprosthetic aortic valve for aortic stenosis, GBS bacteremia.   1. Atrial flutter-appreciate consultation from electrophysiology team. Carvedilol has been decreased. Continued diltiazem at 60 every 6. Amiodarone has also been decreased to 100 mg twice a day. She has had prior bradycardia with higher dose medication/amiodarone. Agree that not candidate for pacemaker at this time given bacteremia. I will consolidate diltiazem to 240mg  QD. IV heparin. Not felt to be good overal coumadin candidate previously.  2. Group B strep bacteremia - appreciate ID consult, hospitalist assistance. Ceftriaxone IV. Per ID, will need PICC line. Appreciate ID and TRH assistance in this matter.  I am fine with addition of rifampin per ID. Pharmacy can assist. Will let ID prescribe.   3. Diabetes-stable appreciate hospitalist assistance.   4. Leukocytosis-originally 20,000 now normal.   5. Diastolic heart failure/acute on chronic-continued with diuresis. Weight remains 99 kg. Negative net 8.7 L.   6. Mildly elevated troponin-secondary to tachycardia on admission.   7. CKD stage 3 - monitor.   PT to eval, leg weakness.   I am OK with discharge once PICC line, abx issues have been resolved by ID/TRH.     SKAINS, MARK 02/08/2013, 9:16 AM

## 2013-02-08 NOTE — Evaluation (Signed)
Agree with PT evaluation.  Olsen Mccutchan, PT DPT [phone]  

## 2013-02-08 NOTE — Progress Notes (Signed)
ANTICOAGULATION CONSULT NOTE - Follow Up Consult  Pharmacy Consult:  Heparin Indication: Atrial fibrillation  Patient Measurements: Height: 5' 1.81" (157 cm) Weight: 219 lb 4.8 oz (99.474 kg) (SCALE A) IBW/kg (Calculated) : 49.67 Heparin Dosing Weight: 73 kg  Vital Signs: Temp: 97.7 F (36.5 C) (05/15 0442) Temp src: Oral (05/15 0442) BP: 130/75 mmHg (05/15 0442) Pulse Rate: 73 (05/15 0442)  Labs:  Recent Labs  02/06/13 0430 02/06/13 1452 02/07/13 0515 02/08/13 0500 02/08/13 0923  HGB 10.8*  --  11.6* 12.9  --   HCT 32.3*  --  34.9* 38.3  --   PLT 123*  --  130* 153  --   HEPARINUNFRC 0.17* 0.51 0.51 0.64  --   CREATININE 1.38*  --   --   --  1.25*    Estimated Creatinine Clearance: 41.4 ml/min (by C-G formula based on Cr of 1.25).   Assessment: 38 YOF continues on IV heparin for atrial fibrillation. Heparin level this am is 0.6 which is therapeutic on 2050 units/hr. No bleeding noted.  Hemoglobin is relatively stable and her platelets are improving. Previously not thought to be a coumadin candidate, also not thought to be a candidate for pacemaker.  TEE performed 5/14 did not show vegetations or clot.  Goal of Therapy:  Heparin level 0.3-0.7 units/ml Monitor platelets by anticoagulation protocol: Yes    Plan:  Continue heparin gtt at 2000 units/hr Daily AM HL and CBC.   Sheppard Coil PharmD., BCPS Clinical Pharmacist Pager [phone] 02/08/2013 10:50 AM

## 2013-02-09 ENCOUNTER — Inpatient Hospital Stay (HOSPITAL_COMMUNITY): Payer: Medicare Other

## 2013-02-09 LAB — CBC
HCT: 40.8 % (ref 36.0–46.0)
Hemoglobin: 14 g/dL (ref 12.0–15.0)
MCH: 31.6 pg (ref 26.0–34.0)
MCHC: 34.3 g/dL (ref 30.0–36.0)
MCV: 92.1 fL (ref 78.0–100.0)
RBC: 4.43 MIL/uL (ref 3.87–5.11)

## 2013-02-09 LAB — GLUCOSE, CAPILLARY
Glucose-Capillary: 183 mg/dL — ABNORMAL HIGH (ref 70–99)
Glucose-Capillary: 318 mg/dL — ABNORMAL HIGH (ref 70–99)

## 2013-02-09 LAB — HEPARIN LEVEL (UNFRACTIONATED): Heparin Unfractionated: 0.74 IU/mL — ABNORMAL HIGH (ref 0.30–0.70)

## 2013-02-09 MED ORDER — SODIUM CHLORIDE 0.9 % IJ SOLN: 10.0000 mL | INTRAMUSCULAR | Status: DC | PRN

## 2013-02-09 MED ORDER — FUROSEMIDE 80 MG PO TABS
80.0000 mg | ORAL_TABLET | Freq: Two times a day (BID) | ORAL | Status: DC
  Filled 2013-02-09 (×2): qty 1

## 2013-02-09 MED ORDER — METOPROLOL TARTRATE 50 MG PO TABS: 50.0000 mg | ORAL_TABLET | Freq: Two times a day (BID) | ORAL | Status: DC

## 2013-02-09 MED ORDER — DEXTROSE 5 % IV SOLN: 2.0000 g | INTRAVENOUS | Status: AC

## 2013-02-09 MED ORDER — RIFAMPIN 300 MG PO CAPS: 300.0000 mg | ORAL_CAPSULE | Freq: Every day | ORAL | Status: AC

## 2013-02-09 MED ORDER — AMIODARONE HCL 100 MG PO TABS: 100.0000 mg | ORAL_TABLET | Freq: Two times a day (BID) | ORAL | Status: DC

## 2013-02-09 NOTE — Progress Notes (Signed)
Peripherally Inserted Central Catheter/Midline Placement  The IV Nurse has discussed with the patient and/or persons authorized to consent for the patient, the purpose of this procedure and the potential benefits and risks involved with this procedure.  The benefits include less needle sticks, lab draws from the catheter and patient may be discharged home with the catheter.  Risks include, but not limited to, infection, bleeding, blood clot (thrombus formation), and puncture of an artery; nerve damage and irregular heat beat.  Alternatives to this procedure were also discussed.  PICC/Midline Placement Documentation        Lisabeth Devoid 02/09/2013, 9:36 AM

## 2013-02-09 NOTE — Progress Notes (Signed)
Went over all discharge instructions with daughter who lives with pt. And is also an Charity fundraiser

## 2013-02-09 NOTE — Progress Notes (Signed)
Physical Therapy Treatment Patient Details Name: [name] MRN: [id number] DOB: 1933-08-20 Today's Date: 02/09/2013 Time: 1478-2956 PT Time Calculation (min): 26 min  PT Assessment / Plan / Recommendation Comments on Treatment Session  Pt complaining of HA today (5/10).  she walked 100 feet with RW.  did well. no SOB.  pt worked on Field seismologist with walker for turning and backing up.  Pt cued to keep the walker with her at all times and stay in teh walker.  Pt planning to go home today.  she has ramped entry.  we talked about energy conservation technques for getting home and how she can slowly progress her mobilty and endurance.  I agree with HHPT recommendation.    Follow Up Recommendations  Home health PT;Supervision/Assistance - 24 hour     Does the patient have the potential to tolerate intense rehabilitation     Barriers to Discharge        Equipment Recommendations  None recommended by PT    Recommendations for Other Services    Frequency Min 3X/week   Plan Discharge plan remains appropriate    Precautions / Restrictions Precautions Precautions: Fall Restrictions Weight Bearing Restrictions: No   Pertinent Vitals/Pain Pt with headache 5/10    Mobility  Bed Mobility Right Sidelying to Sit: 5: Supervision Sitting - Scoot to Edge of Bed: 5: Supervision Transfers Transfers: Sit to Stand;Stand to Sit Sit to Stand: 4: Min assist;With upper extremity assist;From bed Stand to Sit: 4: Min guard;With upper extremity assist;To bed Ambulation/Gait Ambulation/Gait Assistance: 4: Min guard Ambulation Distance (Feet): 100 Feet Assistive device: Rolling walker Ambulation/Gait Assistance Details: at end of walk pt stopped at [facility] and used bathroom.  Pt needed cues for safety for backing up and turning.  she was cued to keep walker with her for safety.  she practuced this a few times to get make sure she understood Gait Pattern: Trunk flexed;Step-through pattern;Decreased stride length     Exercises     PT Diagnosis:    PT Problem List:   PT Treatment Interventions:     PT Goals Acute Rehab PT Goals PT Goal: Supine/Side to Sit - Progress: Progressing toward goal PT Goal: Sit to Stand - Progress: Progressing toward goal PT Transfer Goal: Bed to Chair/Chair to Bed - Progress: Progressing toward goal PT Goal: Ambulate - Progress: Progressing toward goal  Visit Information  Last PT Received On: 02/09/13 Assistance Needed: +1    Subjective Data  Subjective: i have a headache today but I will walk   Cognition  Cognition Arousal/Alertness: Awake/alert Overall Cognitive Status: Within Functional Limits for tasks assessed    Balance     End of Session PT - End of Session Activity Tolerance: Patient tolerated treatment well Patient left: in bed;with call bell/phone within reach   GP     Judson Roch 02/09/2013, 4:41 PM   Ranae Palms, PT

## 2013-02-09 NOTE — Progress Notes (Signed)
[facility] PROGRESS NOTE  [name] [facility] ZOX:[id number] DOB: [birth year] DOA: 02/02/2013 PCP: [name], MD  Assessment/Plan: Principal Problem:   Bacteremia due to group B Streptococcus Active Problems:   AODM   HYPERLIPIDEMIA   OBESITY, MORBID   SLEEP APNEA, OBSTRUCTIVE, MILD   HYPERTENSION, CONTROLLED   PAROXYSMAL ATRIAL FIBRILLATION   ATHEROSCLEROTIC CARDIOVASCULAR DISEASE   AORTIC VALVE REPLACEMENT, HX OF   CHF (congestive heart failure)   Poor dentition   S/P total knee arthroplasty   Recurrent cellulitis of lower leg     Consult Note FU  Brief Narrative:  79 year old female with CAD/CABG, Aortic valve replacement bioprosthetic, LBBB, P.atrial flutter on chronic low-dose amiodarone, diastolic heart failure, morbid obesity, diabetes woke up on day of admission and did not feel well. She felt as though her heart was pounding and she was diaphoretic.  She came to the ER was noted to be in Aflutter with RVR, and leukocytosis. SHe was admitted by Dr.Skains and loaded with amiodarone, and started on CArdizem also was seen By [facility] in consultation for leukocytosis.  She had blood Cx drawn on admission, which grew Group B streptococcus, Is getting work up to r/o endocarditis, TEE pending    Assessment/Plan:  1. Group B Strep Bacteremia -started on IV Ceftriaxone 5/11, was on Vancomcyin for 1 day prior  -concern for endocarditis given no other source of infection and Aortic bioprosthetic valve  -repeated Blood Cx x2 on 5/11: NGTD  -TTE unremarkable  -TEE unremarkable  -Appreciate ID consult  Currently on Rocephin day 6  Would place pic and treat her for 4 weeks (including her 6 days so far, therefore 22 more days).  This is a long course with her negative studies, however she is very HIGH risk with AVR and TKRs.  Infectious disease would add rifampin 300mg  qday if ok with CV and her meds can be adjusted.  She needs repeat BCx 1 week after she completes her anbx  PICC  line placed  Anticipate DC today    2. Mildly elevated LFTS  -likely secondary to Amiodarone  -USG benign  Recheck LFTs today  3. DM:  -continue lantus, SSI  4. Aflutter/Diastolic CHF  -amio/diuresis per cards  Thank you for the consult, we will follow    HPI/Subjective:  Feeling well no chest pain, no shortness of breath    Objective: Filed Vitals:   02/08/13 1411 02/08/13 1938 02/08/13 2259 02/09/13 0551  BP: 129/66 128/67 128/51 121/62  Pulse: 88 74 70 83  Temp: 97.8 F (36.6 C) 98.1 F (36.7 C)  97.6 F (36.4 C)  TempSrc: Oral Oral  Oral  Resp: 20 18  18   Height:      Weight:    97.16 kg (214 lb 3.2 oz)  SpO2: 95% 91%  96%    Intake/Output Summary (Last 24 hours) at 02/09/13 1026 Last data filed at 02/09/13 0831  Gross per 24 hour  Intake   1782 ml  Output   1600 ml  Net    182 ml    Exam:  General: Well developed, well nourished, in no acute distress.  Head: Normocephalic and atraumatic. Alopecia  Lungs: Clear to auscultation and percussion.  Heart: Normal S1 and S2, frequent ectopy. 2/6 systolic murmur right upper sternal border, no rubs or gallops.  Abdomen: soft, non-tender, positive bowel sounds. Obese  Extremities: No clubbing or cyanosis. Trace edema.  Neurologic: Alert and oriented x 3.       Data Reviewed: Basic Metabolic Panel:  Recent Labs Lab 02/03/13 0157 02/04/13 0450 02/05/13 0510 02/06/13 0430 02/08/13 0923  NA 133*  136 131* 133* 134* 136  K 3.9  3.6 3.8 4.0 3.5 4.0  CL 98  100 96 99 98 95*  CO2 25  26 23 24 27 29   GLUCOSE 281*  205* 148* 164* 139* 221*  BUN 30*  32* 39* 44* 37* 23  CREATININE 1.24*  1.25* 1.48* 1.56* 1.38* 1.25*  CALCIUM 8.7  8.7 8.8 9.3 9.4 10.0    Liver Function Tests:  Recent Labs Lab 02/02/13 1624 02/02/13 2035 02/03/13 0157 02/08/13 0923  AST 71* 77* 56* 28  ALT 33 44* 39* 23  ALKPHOS 158* 130* 120* 115  BILITOT 1.5* 1.6* 1.6* 0.6  PROT 8.6* 7.1 6.8 7.9  ALBUMIN 4.2 3.5 3.1*  3.3*   No results found for this basename: LIPASE, AMYLASE,  in the last 168 hours No results found for this basename: AMMONIA,  in the last 168 hours  CBC:  Recent Labs Lab 02/05/13 0510 02/06/13 0430 02/07/13 0515 02/08/13 0500 02/09/13 0500  WBC 7.4 6.6 6.4 7.7 9.5  HGB 10.9* 10.8* 11.6* 12.9 14.0  HCT 32.7* 32.3* 34.9* 38.3 40.8  MCV 91.9 92.6 91.8 92.7 92.1  PLT 113* 123* 130* 153 159    Cardiac Enzymes:  Recent Labs Lab 02/02/13 2035 02/03/13 0145 02/03/13 0805  TROPONINI 0.51* 0.69* 0.63*   BNP (last 3 results)  Recent Labs  07/28/12 1536 02/02/13 2035 02/04/13 1050  PROBNP 5777.0* 4802.0* 23347.0*     CBG:  Recent Labs Lab 02/08/13 0549 02/08/13 1136 02/08/13 1609 02/08/13 2057 02/09/13 0607  GLUCAP 170* 312* 300* 197* 183*    Recent Results (from the past 240 hour(s))  CULTURE, BLOOD (ROUTINE X 2)     Status: None   Collection Time    02/02/13  6:39 PM      Result Value Range Status   Specimen Description BLOOD ARM LEFT   Final   Special Requests BOTTLES DRAWN AEROBIC AND ANAEROBIC 10CC   Final   Culture  Setup Time 02/03/2013 05:05   Final   Culture     Final   Value: GROUP B STREP(S.AGALACTIAE)ISOLATED     Note: Gram Stain Report Called to,Read Back By and Verified With: TINA WOODS 02/03/13 @ 2:29PM BY RUSCA.   Report Status 02/05/2013 FINAL   Final   Organism ID, Bacteria GROUP B STREP(S.AGALACTIAE)ISOLATED   Final  CULTURE, BLOOD (ROUTINE X 2)     Status: None   Collection Time    02/02/13  6:46 PM      Result Value Range Status   Specimen Description BLOOD HAND LEFT   Final   Special Requests BOTTLES DRAWN AEROBIC AND ANAEROBIC 10CC   Final   Culture  Setup Time 02/03/2013 05:05   Final   Culture     Final   Value: GROUP B STREP(S.AGALACTIAE)ISOLATED     Note: SUSCEPTIBILITIES PERFORMED ON PREVIOUS CULTURE WITHIN THE LAST 5 DAYS.     Note: Gram Stain Report Called to,Read Back By and Verified With: TINA WOODS 02/03/13 @ 2:29PM BY  RUSCA.   Report Status 02/05/2013 FINAL   Final  URINE CULTURE     Status: None   Collection Time    02/03/13  6:47 AM      Result Value Range Status   Specimen Description URINE, RANDOM   Final   Special Requests NONE   Final   Culture  Setup Time 02/03/2013 13:28  Final   Colony Count 30,000 COLONIES/ML   Final   Culture     Final   Value: Multiple bacterial morphotypes present, none predominant. Suggest appropriate recollection if clinically indicated.   Report Status 02/04/2013 FINAL   Final  CULTURE, BLOOD (ROUTINE X 2)     Status: None   Collection Time    02/04/13 10:50 AM      Result Value Range Status   Specimen Description BLOOD LEFT ARM   Final   Special Requests BOTTLES DRAWN AEROBIC AND ANAEROBIC 10CC   Final   Culture  Setup Time 02/04/2013 19:22   Final   Culture     Final   Value:        BLOOD CULTURE RECEIVED NO GROWTH TO DATE CULTURE WILL BE HELD FOR 5 DAYS BEFORE ISSUING A FINAL NEGATIVE REPORT   Report Status PENDING   Incomplete  CULTURE, BLOOD (ROUTINE X 2)     Status: None   Collection Time    02/04/13 10:58 AM      Result Value Range Status   Specimen Description BLOOD LEFT HAND   Final   Special Requests BOTTLES DRAWN AEROBIC ONLY 10CC   Final   Culture  Setup Time 02/04/2013 19:23   Final   Culture     Final   Value:        BLOOD CULTURE RECEIVED NO GROWTH TO DATE CULTURE WILL BE HELD FOR 5 DAYS BEFORE ISSUING A FINAL NEGATIVE REPORT   Report Status PENDING   Incomplete  CULTURE, BLOOD (ROUTINE X 2)     Status: None   Collection Time    02/04/13  3:11 PM      Result Value Range Status   Specimen Description BLOOD RIGHT HAND   Final   Special Requests BOTTLES DRAWN AEROBIC ONLY 8CC   Final   Culture  Setup Time 02/04/2013 21:42   Final   Culture     Final   Value:        BLOOD CULTURE RECEIVED NO GROWTH TO DATE CULTURE WILL BE HELD FOR 5 DAYS BEFORE ISSUING A FINAL NEGATIVE REPORT   Report Status PENDING   Incomplete  CULTURE, BLOOD (ROUTINE X 2)      Status: None   Collection Time    02/04/13  3:18 PM      Result Value Range Status   Specimen Description BLOOD LEFT ARM   Final   Special Requests BOTTLES DRAWN AEROBIC ONLY 10CC   Final   Culture  Setup Time 02/04/2013 21:42   Final   Culture     Final   Value:        BLOOD CULTURE RECEIVED NO GROWTH TO DATE CULTURE WILL BE HELD FOR 5 DAYS BEFORE ISSUING A FINAL NEGATIVE REPORT   Report Status PENDING   Incomplete     Studies: US Abdomen Complete  02/03/2013   *RADIOLOGY REPORT*  Clinical Data:  Abnormal liver function tests.  COMPLETE ABDOMINAL ULTRASOUND  Comparison:  None.  Findings:  Gallbladder:  Removed.  Common bile duct:  Measures 0.8 cm.  Liver:  No focal lesion identified.  Within normal limits in parenchymal echogenicity.  IVC:  Appears normal.  Pancreas:  No focal abnormality seen.  Spleen:  Measures 10.0 cm and appears normal.  Right Kidney:  Measures 9.1 cm and demonstrate some cortical thinning.  Left Kidney:  Measures 9.1 cm and demonstrate some cortical thinning.  Abdominal aorta:  Negative for aneurysm.  IMPRESSION: No acute  finding or abnormality to explain elevated liver function tests.  Status post cholecystectomy.   Original Report Authenticated By: Holley Dexter, M.D.   Dg Chest Port 1 View  02/09/2013   *RADIOLOGY REPORT*  Clinical Data: Right PICC line placement.  PORTABLE CHEST - 1 VIEW  Comparison: 02/02/2013.  Findings: Right PICC line tip mid superior vena cava level.  No gross pneumothorax.  Cardiomegaly post valve replacement.  Pulmonary vascular congestion.  No gross pneumothorax or segmental consolidation. Calcified mildly tortuous aorta.  IMPRESSION: Right PICC line tip mid superior vena cava level.  Please see above.  This is a call report.   Original Report Authenticated By: Lacy Duverney, M.D.   Dg Chest Portable 1 View  02/02/2013   *RADIOLOGY REPORT*  Clinical Data: Shortness of breath and tachycardia.  PORTABLE CHEST - 1 VIEW  Comparison: 07/28/2012  and prior chest radiographs  Findings: Cardiomegaly with pulmonary vascular congestion noted. Mild chronic interstitial opacities are again noted. A defibrillator pad overlying the left chest is noted. Evidence of previous cardiac surgery/valve replacement noted. There is no evidence of focal airspace disease, suspicious pulmonary nodule/mass, pleural effusion, or pneumothorax. No acute bony abnormalities are identified.  IMPRESSION: Cardiomegaly with pulmonary vascular congestion and possible minimal interstitial pulmonary edema.   Original Report Authenticated By: Harmon Pier, M.D.    Scheduled Meds: . amiodarone  100 mg Oral BID  . aspirin EC  81 mg Oral Daily  . cefTRIAXone (ROCEPHIN)  IV  2 g Intravenous Q24H  . donepezil  10 mg Oral QHS  . ferrous sulfate  325 mg Oral BID  . furosemide  80 mg Oral BID  . gabapentin  300 mg Oral q morning - 10a  . gabapentin  600 mg Oral QHS  . insulin aspart  0-15 Units Subcutaneous TID WC  . insulin glargine  24 Units Subcutaneous QHS  . metoprolol tartrate  50 mg Oral BID  . PARoxetine  40 mg Oral Daily  . potassium chloride SA  20 mEq Oral TID  . rifampin  300 mg Oral Daily  . sodium chloride  3 mL Intravenous Q12H  . spironolactone  12.5 mg Oral BID   Continuous Infusions: . sodium chloride 1,000 mL (02/09/13 0717)  . heparin 1,950 Units/hr (02/09/13 0713)    Principal Problem:   Bacteremia due to group B Streptococcus Active Problems:   AODM   HYPERLIPIDEMIA   OBESITY, MORBID   SLEEP APNEA, OBSTRUCTIVE, MILD   HYPERTENSION, CONTROLLED   PAROXYSMAL ATRIAL FIBRILLATION   ATHEROSCLEROTIC CARDIOVASCULAR DISEASE   AORTIC VALVE REPLACEMENT, HX OF   CHF (congestive heart failure)   Poor dentition   S/P total knee arthroplasty   Recurrent cellulitis of lower leg    Time spent: 40 minutes   [facility]  [facility] Pager [phone]. If 8PM-8AM, please contact night-coverage at [email or web address], password [facility] 02/09/2013, 10:26 AM   LOS: 7 days

## 2013-02-09 NOTE — Discharge Summary (Addendum)
Patient ID: [name] MRN: [id number] DOB/AGE: [birth year] 79 y.o.  Admit date: 02/02/2013 Discharge date: 02/09/2013  Primary Discharge Diagnosis: group B strep bacteremia, blood cultures x2, atrial flutter with rapid ventricular response, heart rate 216bpm  Secondary Discharge Diagnosis: Diabetes, obesity, deconditioning, acute on chronic diastolic heart failure, memory impairment, coronary artery disease status post bypass  Significant Diagnostic Studies: transesophageal echocardiogram-no evidence of vegetation on bioprosthetic aortic valve  Consults: electrophysiology, [facility], infectious disease  Hospital Course: 79 year old female who [facility] with rapid ventricular response atrial flutter conducting in a one to one fashion at 216 beats per minute. She remembers feeling diaphoretic,, palpitations, pounding at home and she told her daughter. EMS gave her 150 mg of IV amiodarone. Adenosine seen was administered twice in the emergency room without success. Diltiazem IV finally helped blocker AV node and decreased her heart rate to 108 beats per minute, exactly half of the 216. She was now conducting in a 2-1 pattern. Blood cultures were drawn and she was found to have 2 positive blood cultures for group B strep. Infectious disease, Dr. Ninetta Lights was consulted. IV ceftriaxone was administered. A PICC line was placed on 02/09/13 after 7 days of antibiotics. Repeat blood cultures were negative. Recommendation was to complete 4 weeks of antibiotics total with ceftriaxone IV as well as administer rifampin 300 mg. Pharmacy describe potential interactions with rifampin including decreased efficacy/bioavailability of diltiazem and recommended transition to metoprolol. Because of this, her carvedilol 3.125 mg twice a day and diltiazem 60 mg by mouth every 6 hours was discontinued and metoprolol heart rate 50 mg twice a day was administered. She seemed to tolerate this  well. Heart rate remained in the 70s.  Throughout the admission she remained in atrial flutter. IV heparin was administered. She is demonstrated on multiple occasions at home that she is not able to tolerate chronic anticoagulation and was felt not to be a Coumadin candidate after multiple failed attempts. Because of this, she will resume aspirin only. Both she and her daughter understand the risk of stroke.  Electrophysiology, Dr. Berton Mount, saw her in consultation as well and recommended continued AV blockade. Mild adjustments were made in her beta blocker at that time. Given her bacteremia, clearly she is not a pacemaker candidate. She did have bradycardia in the past with increased amiodarone dosage. Because of this, she was decreased to 100 mg by mouth twice a day.  She was also noted to have acute on chronic diastolic heart failure during admission and was treated with IV furosemideand has a net negative output of 8.5 L throughout admission.her weight was 97 kg on discharge.  Throughout the admission,  hospitalist team, has been assisting with multiple medical issues and assisted with PICC line placement as well as helping with establishing IV antibiotics at home. I have also written an order for home health and PT. Physical therapy saw her in consultation and recommended PT.     Discharge Exam: Blood pressure 121/62, pulse 83, temperature 97.6 F (36.4 C), temperature source Oral, resp. rate 18, height 5' 1.81" (1.57 m), weight 97.16 kg (214 lb 3.2 oz), SpO2 96.00%.    General: Well developed, well nourished, in no acute distress.  Head: Normocephalic and atraumatic. Alopecia  Lungs: Clear to auscultation and percussion.  Heart: Normal S1 and S2, frequent ectopy. 2/6 systolic murmur right upper sternal border, no rubs or gallops.  Abdomen: soft, non-tender, positive bowel sounds. Obese  Extremities: No clubbing or  cyanosis. Trace edema.  Neurologic: Alert and oriented x 3.  Labs:    Lab Results  Component Value Date   WBC 9.5 02/09/2013   HGB 14.0 02/09/2013   HCT 40.8 02/09/2013   MCV 92.1 02/09/2013   PLT 159 02/09/2013    Recent Labs Lab 02/08/13 0923  NA 136  K 4.0  CL 95*  CO2 29  BUN 23  CREATININE 1.25*  CALCIUM 10.0  PROT 7.9  BILITOT 0.6  ALKPHOS 115  ALT 23  AST 28  GLUCOSE 221*   Lab Results  Component Value Date   CKTOTAL 42 04/12/2012   CKMB 1.4 04/12/2012   TROPONINI 0.63* 02/03/2013    Lab Results  Component Value Date   CHOL 143 02/03/2013   CHOL  Value: 111        ATP III CLASSIFICATION:  <200     mg/dL   Desirable  161-096  mg/dL   Borderline High  >=045    mg/dL   High 40/05/8118   Lab Results  Component Value Date   HDL 74 02/03/2013   HDL 42 14/03/8294   Lab Results  Component Value Date   LDLCALC 62 02/03/2013   LDLCALC  Value: 54        Total Cholesterol/HDL:CHD Risk Coronary Heart Disease Risk Table                     Men   Women  1/2 Average Risk   3.4   3.3 08/30/2008   Lab Results  Component Value Date   TRIG 34 02/03/2013   TRIG 76 08/30/2008   Lab Results  Component Value Date   CHOLHDL 1.9 02/03/2013   CHOLHDL 2.6 08/30/2008   No results found for this basename: LDLDIRECT      FOLLOW UP PLANS AND APPOINTMENTS    Medication List    STOP taking these medications       carvedilol 6.25 MG tablet  Commonly known as:  COREG     metolazone 2.5 MG tablet  Commonly known as:  ZAROXOLYN      TAKE these medications       acetaminophen 325 MG tablet  Commonly known as:  TYLENOL  Take 162.5 mg by mouth as needed. For pain     amiodarone 100 MG tablet  Commonly known as:  PACERONE  Take 1 tablet (100 mg total) by mouth 2 (two) times daily.     aspirin EC 81 MG tablet  Take 1 tablet (81 mg total) by mouth daily.     dextrose 5 % SOLN 50 mL with cefTRIAXone 2 G SOLR 2 g  Inject 2 g into the vein daily.     diphenhydrAMINE-zinc acetate cream  Commonly known as:  BENADRYL  Apply 1 application topically 2  (two) times daily as needed. For itching     docusate sodium 100 MG capsule  Commonly known as:  COLACE  Take 1 capsule (100 mg total) by mouth 2 (two) times daily as needed. For constipation     donepezil 10 MG tablet  Commonly known as:  ARICEPT  Take 1 tablet (10 mg total) by mouth at bedtime.     ferrous sulfate 325 (65 FE) MG tablet  Take 1 tablet (325 mg total) by mouth 2 (two) times daily.     fish oil-omega-3 fatty acids 1000 MG capsule  Take 1 g by mouth daily.     furosemide 40 MG tablet  Commonly known  as:  LASIX  Take 80-160 mg by mouth 2 (two) times daily. 4 tabs in the am, 2 tabs at bedtime     gabapentin 600 MG tablet  Commonly known as:  NEURONTIN  Take 300-600 mg by mouth 2 (two) times daily. Take 1/2 tab in the morning and 1 tab at bedtime     insulin glargine 100 UNIT/ML injection  Commonly known as:  LANTUS  Inject 24 Units into the skin every evening.     metoprolol 50 MG tablet  Commonly known as:  LOPRESSOR  Take 1 tablet (50 mg total) by mouth 2 (two) times daily.     multivitamin tablet  Take 1 tablet by mouth daily.     PARoxetine 40 MG tablet  Commonly known as:  PAXIL  Take 1 tablet (40 mg total) by mouth every morning.     polyethylene glycol packet  Commonly known as:  MIRALAX / GLYCOLAX  Take 17 g by mouth daily as needed. For constipation     potassium chloride SA 20 MEQ tablet  Commonly known as:  K-DUR,KLOR-CON  Take 1 tablet (20 mEq total) by mouth 3 (three) times daily.     rifampin 300 MG capsule  Commonly known as:  RIFADIN  Take 1 capsule (300 mg total) by mouth daily.     Senna 15 MG Tabs  Take 1 tablet by mouth daily as needed. For constipation     simvastatin 10 MG tablet  Commonly known as:  ZOCOR  Take 1 tablet (10 mg total) by mouth at bedtime.     spironolactone 12.5 mg Tabs  Commonly known as:  ALDACTONE  Take 0.5 tablets (12.5 mg total) by mouth 2 (two) times daily.           Follow-up Information    Follow up with FERGUSON,CYNTHIA A, NP On 02/16/2013. (1:30pm)    Contact information:   [facility] [address], [address] [place] [address] [phone]       BRING ALL MEDICATIONS WITH YOU TO FOLLOW UP APPOINTMENTS  Time spent with patient to include physician time: , medicine reconciliation, review of medical records, instruction  SignedDonato Schultz 02/09/2013, 8:58 AM

## 2013-02-09 NOTE — Progress Notes (Signed)
ANTICOAGULATION CONSULT NOTE - Follow Up Consult  Pharmacy Consult:  Heparin Indication: Atrial fibrillation  Patient Measurements: Height: 5' 1.81" (157 cm) Weight: 214 lb 3.2 oz (97.16 kg) (scale a) IBW/kg (Calculated) : 49.67 Heparin Dosing Weight: 73 kg  Vital Signs: Temp: 97.6 F (36.4 C) (05/16 0551) Temp src: Oral (05/16 0551) BP: 121/62 mmHg (05/16 0551) Pulse Rate: 83 (05/16 0551)  Labs:  Recent Labs  02/07/13 0515 02/08/13 0500 02/08/13 0923 02/09/13 0500  HGB 11.6* 12.9  --  14.0  HCT 34.9* 38.3  --  40.8  PLT 130* 153  --  159  HEPARINUNFRC 0.51 0.64  --  0.74*  CREATININE  --   --  1.25*  --     Estimated Creatinine Clearance: 40.9 ml/min (by C-G formula based on Cr of 1.25).   Assessment: 23 YOF continues on IV heparin for atrial fibrillation. Previously not thought to be a Coumadin candidate, also not thought to be a candidate for pacemaker.  TEE performed 5/14 did not show vegetations or clot.  Heparin level (0.74) is above-goal on 2000 units/hr. No bleeding per RN.  Goal of Therapy:  Heparin level 0.3-0.7 units/ml Monitor platelets by anticoagulation protocol: Yes   Plan:  1. Decrease IV heparin to 1950 units/hr.  2. Heparin level in 8 hours.   Lorre Munroe, PharmD 02/09/2013 6:57 AM

## 2013-02-09 NOTE — Progress Notes (Signed)
Discharged per w/c accompanied by nurse tech and family. Pt has all personal belongings.

## 2013-02-09 NOTE — Care Management Note (Signed)
    Page 1 of 2   02/09/2013     12:00:40 PM   CARE MANAGEMENT NOTE 02/09/2013  Patient:  [name], [name]   Account Number:  [id number]  Date Initiated:  02/09/2013  Documentation initiated by:  [facility]  Subjective/Objective Assessment:   79 yo female CC: AFLUTTER one to one conduction (felt heart pounding), 216bpm.     Action/Plan:   Anti arrthymia/ Home with PICC for IV antix, HHRN, HHPT   Anticipated DC Date:  02/09/2013   Anticipated DC Plan:  HOME W HOME HEALTH SERVICES      DC Planning Services  CM consult      [facility] Choice  HOME HEALTH   Choice offered to / List presented to:  C-1 Patient        HH arranged  HH-1 RN  HH-10 DISEASE MANAGEMENT  HH-2 PT  IV Antibiotics      HH agency  [facility]   Status of service:  Completed, signed off Medicare Important Message given?   (If response is "NO", the following Medicare IM given date fields will be blank) Date Medicare IM given:   Date Additional Medicare IM given:    Discharge Disposition:    Per UR Regulation:  Reviewed for med. necessity/level of care/duration of stay  If discussed at Long Length of Stay Meetings, dates discussed:   02/08/2013    Comments:  02/09/13 @ 1100.Marland KitchenMarland KitchenOletta Cohn, RN, BSN, [employer] [phone] Spoke with pt regarding discharge planning.  Offered pt [facility] of home health agencies.  Pt chose [facility] to render services of RN, PT and IV antibiotics.  Kizzie Furnish of [facility] notifried.

## 2013-02-10 LAB — CULTURE, BLOOD (ROUTINE X 2)
Culture: NO GROWTH
Culture: NO GROWTH

## 2013-03-13 ENCOUNTER — Encounter (HOSPITAL_COMMUNITY): Payer: Self-pay | Admitting: Physical Medicine and Rehabilitation

## 2013-03-13 ENCOUNTER — Inpatient Hospital Stay (HOSPITAL_COMMUNITY)
Admission: EM | Admit: 2013-03-13 | Discharge: 2013-03-16 | DRG: 292 | Disposition: A | Discharge status: Home Health | Payer: Medicare Other | Attending: Internal Medicine | Admitting: Internal Medicine

## 2013-03-13 ENCOUNTER — Emergency Department (HOSPITAL_COMMUNITY): Payer: Medicare Other

## 2013-03-13 DIAGNOSIS — L659 Nonscarring hair loss, unspecified: Secondary | ICD-10-CM

## 2013-03-13 DIAGNOSIS — K644 Residual hemorrhoidal skin tags: Secondary | ICD-10-CM

## 2013-03-13 DIAGNOSIS — N183 Chronic kidney disease, stage 3 unspecified: Secondary | ICD-10-CM | POA: Diagnosis present

## 2013-03-13 DIAGNOSIS — N039 Chronic nephritic syndrome with unspecified morphologic changes: Secondary | ICD-10-CM

## 2013-03-13 DIAGNOSIS — D631 Anemia in chronic kidney disease: Secondary | ICD-10-CM | POA: Diagnosis present

## 2013-03-13 DIAGNOSIS — I498 Other specified cardiac arrhythmias: Secondary | ICD-10-CM | POA: Diagnosis present

## 2013-03-13 DIAGNOSIS — Z951 Presence of aortocoronary bypass graft: Secondary | ICD-10-CM

## 2013-03-13 DIAGNOSIS — I447 Left bundle-branch block, unspecified: Secondary | ICD-10-CM | POA: Diagnosis present

## 2013-03-13 DIAGNOSIS — F0280 Dementia in other diseases classified elsewhere without behavioral disturbance: Secondary | ICD-10-CM | POA: Diagnosis present

## 2013-03-13 DIAGNOSIS — Z794 Long term (current) use of insulin: Secondary | ICD-10-CM

## 2013-03-13 DIAGNOSIS — E119 Type 2 diabetes mellitus without complications: Secondary | ICD-10-CM

## 2013-03-13 DIAGNOSIS — Z593 Problems related to living in residential institution: Secondary | ICD-10-CM

## 2013-03-13 DIAGNOSIS — M129 Arthropathy, unspecified: Secondary | ICD-10-CM | POA: Diagnosis present

## 2013-03-13 DIAGNOSIS — R531 Weakness: Secondary | ICD-10-CM

## 2013-03-13 DIAGNOSIS — I739 Peripheral vascular disease, unspecified: Secondary | ICD-10-CM | POA: Diagnosis present

## 2013-03-13 DIAGNOSIS — E1149 Type 2 diabetes mellitus with other diabetic neurological complication: Secondary | ICD-10-CM | POA: Diagnosis present

## 2013-03-13 DIAGNOSIS — M810 Age-related osteoporosis without current pathological fracture: Secondary | ICD-10-CM | POA: Diagnosis present

## 2013-03-13 DIAGNOSIS — M6281 Muscle weakness (generalized): Secondary | ICD-10-CM | POA: Diagnosis present

## 2013-03-13 DIAGNOSIS — I2510 Atherosclerotic heart disease of native coronary artery without angina pectoris: Secondary | ICD-10-CM | POA: Diagnosis present

## 2013-03-13 DIAGNOSIS — Z954 Presence of other heart-valve replacement: Secondary | ICD-10-CM

## 2013-03-13 DIAGNOSIS — R7881 Bacteremia: Secondary | ICD-10-CM

## 2013-03-13 DIAGNOSIS — I5043 Acute on chronic combined systolic (congestive) and diastolic (congestive) heart failure: Principal | ICD-10-CM | POA: Diagnosis present

## 2013-03-13 DIAGNOSIS — G4733 Obstructive sleep apnea (adult) (pediatric): Secondary | ICD-10-CM

## 2013-03-13 DIAGNOSIS — I359 Nonrheumatic aortic valve disorder, unspecified: Secondary | ICD-10-CM | POA: Diagnosis present

## 2013-03-13 DIAGNOSIS — G309 Alzheimer's disease, unspecified: Secondary | ICD-10-CM | POA: Diagnosis present

## 2013-03-13 DIAGNOSIS — D649 Anemia, unspecified: Secondary | ICD-10-CM | POA: Diagnosis present

## 2013-03-13 DIAGNOSIS — I129 Hypertensive chronic kidney disease with stage 1 through stage 4 chronic kidney disease, or unspecified chronic kidney disease: Secondary | ICD-10-CM | POA: Diagnosis present

## 2013-03-13 DIAGNOSIS — K140 Glossitis: Secondary | ICD-10-CM

## 2013-03-13 DIAGNOSIS — L03119 Cellulitis of unspecified part of limb: Secondary | ICD-10-CM

## 2013-03-13 DIAGNOSIS — N179 Acute kidney failure, unspecified: Secondary | ICD-10-CM | POA: Diagnosis not present

## 2013-03-13 DIAGNOSIS — I509 Heart failure, unspecified: Secondary | ICD-10-CM | POA: Diagnosis present

## 2013-03-13 DIAGNOSIS — E1142 Type 2 diabetes mellitus with diabetic polyneuropathy: Secondary | ICD-10-CM | POA: Diagnosis present

## 2013-03-13 DIAGNOSIS — K089 Disorder of teeth and supporting structures, unspecified: Secondary | ICD-10-CM

## 2013-03-13 DIAGNOSIS — Z952 Presence of prosthetic heart valve: Secondary | ICD-10-CM

## 2013-03-13 DIAGNOSIS — Z9849 Cataract extraction status, unspecified eye: Secondary | ICD-10-CM

## 2013-03-13 DIAGNOSIS — F330 Major depressive disorder, recurrent, mild: Secondary | ICD-10-CM

## 2013-03-13 DIAGNOSIS — I10 Essential (primary) hypertension: Secondary | ICD-10-CM | POA: Diagnosis present

## 2013-03-13 DIAGNOSIS — Z6841 Body Mass Index (BMI) 40.0 and over, adult: Secondary | ICD-10-CM

## 2013-03-13 DIAGNOSIS — I4891 Unspecified atrial fibrillation: Secondary | ICD-10-CM | POA: Diagnosis present

## 2013-03-13 DIAGNOSIS — Z79899 Other long term (current) drug therapy: Secondary | ICD-10-CM

## 2013-03-13 DIAGNOSIS — E785 Hyperlipidemia, unspecified: Secondary | ICD-10-CM | POA: Diagnosis present

## 2013-03-13 HISTORY — DX: Peripheral vascular disease, unspecified: I73.9

## 2013-03-13 HISTORY — DX: Unspecified osteoarthritis, unspecified site: M19.90

## 2013-03-13 LAB — URINALYSIS, ROUTINE W REFLEX MICROSCOPIC
Leukocytes, UA: NEGATIVE
Nitrite: NEGATIVE
Specific Gravity, Urine: 1.007 (ref 1.005–1.030)
Urobilinogen, UA: 0.2 mg/dL (ref 0.0–1.0)

## 2013-03-13 LAB — CREATININE, SERUM
GFR calc Af Amer: 52 mL/min — ABNORMAL LOW (ref >90)
GFR calc non Af Amer: 45 mL/min — ABNORMAL LOW (ref >90)

## 2013-03-13 LAB — CBC WITH DIFFERENTIAL/PLATELET
Basophils Absolute: 0.1 10*3/uL (ref 0.0–0.1)
Basophils Relative: 1 % (ref 0–1)
Eosinophils Absolute: 0.2 10*3/uL (ref 0.0–0.7)
Eosinophils Relative: 3 % (ref 0–5)
HCT: 36.8 % (ref 36.0–46.0)
Lymphocytes Relative: 19 % (ref 12–46)
MCH: 31.1 pg (ref 26.0–34.0)
MCHC: 33.4 g/dL (ref 30.0–36.0)
MCV: 93.2 fL (ref 78.0–100.0)
Monocytes Absolute: 0.8 10*3/uL (ref 0.1–1.0)
Platelets: 129 10*3/uL — ABNORMAL LOW (ref 150–400)
RDW: 15.2 % (ref 11.5–15.5)

## 2013-03-13 LAB — GLUCOSE, CAPILLARY
Glucose-Capillary: 116 mg/dL — ABNORMAL HIGH (ref 70–99)
Glucose-Capillary: 139 mg/dL — ABNORMAL HIGH (ref 70–99)

## 2013-03-13 LAB — HEMOGLOBIN A1C: Mean Plasma Glucose: 151 mg/dL — ABNORMAL HIGH (ref <117)

## 2013-03-13 LAB — TROPONIN I: Troponin I: <0.3 ng/mL (ref <0.30)

## 2013-03-13 LAB — COMPREHENSIVE METABOLIC PANEL
AST: 23 U/L (ref 0–37)
CO2: 24 mEq/L (ref 19–32)
Calcium: 8.9 mg/dL (ref 8.4–10.5)
Creatinine, Ser: 1.18 mg/dL — ABNORMAL HIGH (ref 0.50–1.10)
GFR calc non Af Amer: 43 mL/min — ABNORMAL LOW (ref >90)
Sodium: 133 mEq/L — ABNORMAL LOW (ref 135–145)
Total Protein: 7.3 g/dL (ref 6.0–8.3)

## 2013-03-13 LAB — CBC
MCV: 93.8 fL (ref 78.0–100.0)
Platelets: 120 10*3/uL — ABNORMAL LOW (ref 150–400)
RBC: 3.72 MIL/uL — ABNORMAL LOW (ref 3.87–5.11)
RDW: 15.1 % (ref 11.5–15.5)
WBC: 5.8 10*3/uL (ref 4.0–10.5)

## 2013-03-13 LAB — URINE MICROSCOPIC-ADD ON

## 2013-03-13 LAB — PROTIME-INR: Prothrombin Time: 15 seconds (ref 11.6–15.2)

## 2013-03-13 MED ORDER — POLYETHYLENE GLYCOL 3350 17 G PO PACK
17.0000 g | PACK | Freq: Every day | ORAL | Status: DC | PRN
  Filled 2013-03-13: qty 1

## 2013-03-13 MED ORDER — GABAPENTIN 600 MG PO TABS
600.0000 mg | ORAL_TABLET | Freq: Every day | ORAL | Status: DC
  Administered 2013-03-13 – 2013-03-15 (×3): 600 mg via ORAL
  Filled 2013-03-13 (×4): qty 1

## 2013-03-13 MED ORDER — SPIRONOLACTONE 12.5 MG HALF TABLET
12.5000 mg | ORAL_TABLET | Freq: Two times a day (BID) | ORAL | Status: DC
Start: 2013-03-13 — End: 2013-03-16
  Administered 2013-03-13 – 2013-03-16 (×6): 12.5 mg via ORAL
  Filled 2013-03-13 (×9): qty 1

## 2013-03-13 MED ORDER — INSULIN ASPART 100 UNIT/ML ~~LOC~~ SOLN: 0.0000 [IU] | Freq: Every day | SUBCUTANEOUS | Status: DC

## 2013-03-13 MED ORDER — NITROGLYCERIN 2 % TD OINT
0.5000 [in_us] | TOPICAL_OINTMENT | Freq: Four times a day (QID) | TRANSDERMAL | Status: DC
  Administered 2013-03-13 – 2013-03-14 (×3): 0.5 [in_us] via TOPICAL
  Filled 2013-03-13: qty 30
  Filled 2013-03-13: qty 1

## 2013-03-13 MED ORDER — ALUM & MAG HYDROXIDE-SIMETH 200-200-20 MG/5ML PO SUSP: 30.0000 mL | Freq: Four times a day (QID) | ORAL | Status: DC | PRN

## 2013-03-13 MED ORDER — ONDANSETRON HCL 4 MG/2ML IJ SOLN: 4.0000 mg | Freq: Four times a day (QID) | INTRAMUSCULAR | Status: DC | PRN

## 2013-03-13 MED ORDER — DOCUSATE SODIUM 100 MG PO CAPS
100.0000 mg | ORAL_CAPSULE | Freq: Two times a day (BID) | ORAL | Status: DC
  Administered 2013-03-13 – 2013-03-16 (×6): 100 mg via ORAL
  Filled 2013-03-13 (×7): qty 1

## 2013-03-13 MED ORDER — FERROUS SULFATE 325 (65 FE) MG PO TABS
325.0000 mg | ORAL_TABLET | Freq: Two times a day (BID) | ORAL | Status: DC
  Administered 2013-03-13 – 2013-03-16 (×6): 325 mg via ORAL
  Filled 2013-03-13 (×7): qty 1

## 2013-03-13 MED ORDER — OMEGA-3 FATTY ACIDS 1000 MG PO CAPS: 1.0000 g | ORAL_CAPSULE | Freq: Every day | ORAL | Status: DC

## 2013-03-13 MED ORDER — HEPARIN SODIUM (PORCINE) 5000 UNIT/ML IJ SOLN
5000.0000 [IU] | Freq: Three times a day (TID) | INTRAMUSCULAR | Status: DC
  Administered 2013-03-13 – 2013-03-14 (×3): 5000 [IU] via SUBCUTANEOUS
  Filled 2013-03-13 (×6): qty 1

## 2013-03-13 MED ORDER — ONDANSETRON HCL 4 MG PO TABS: 4.0000 mg | ORAL_TABLET | Freq: Four times a day (QID) | ORAL | Status: DC | PRN

## 2013-03-13 MED ORDER — FUROSEMIDE 10 MG/ML IJ SOLN
10.0000 mg/h | INTRAVENOUS | Status: AC
  Administered 2013-03-13: 10 mg/h via INTRAVENOUS
  Filled 2013-03-13 (×2): qty 25

## 2013-03-13 MED ORDER — PAROXETINE HCL 20 MG PO TABS
40.0000 mg | ORAL_TABLET | ORAL | Status: DC
  Administered 2013-03-14 – 2013-03-16 (×3): 40 mg via ORAL
  Filled 2013-03-13 (×4): qty 2

## 2013-03-13 MED ORDER — HYDROCODONE-ACETAMINOPHEN 5-325 MG PO TABS: 1.0000 | ORAL_TABLET | ORAL | Status: DC | PRN

## 2013-03-13 MED ORDER — GABAPENTIN 300 MG PO CAPS
300.0000 mg | ORAL_CAPSULE | Freq: Every day | ORAL | Status: DC
  Administered 2013-03-14 – 2013-03-16 (×3): 300 mg via ORAL
  Filled 2013-03-13 (×3): qty 1

## 2013-03-13 MED ORDER — SODIUM CHLORIDE 0.9 % IJ SOLN
3.0000 mL | Freq: Two times a day (BID) | INTRAMUSCULAR | Status: DC
  Administered 2013-03-13 – 2013-03-16 (×6): 3 mL via INTRAVENOUS

## 2013-03-13 MED ORDER — ALBUTEROL SULFATE (5 MG/ML) 0.5% IN NEBU: 2.5000 mg | INHALATION_SOLUTION | RESPIRATORY_TRACT | Status: DC | PRN

## 2013-03-13 MED ORDER — GUAIFENESIN-DM 100-10 MG/5ML PO SYRP: 5.0000 mL | ORAL_SOLUTION | ORAL | Status: DC | PRN

## 2013-03-13 MED ORDER — AMIODARONE HCL 100 MG PO TABS
100.0000 mg | ORAL_TABLET | Freq: Two times a day (BID) | ORAL | Status: DC
  Administered 2013-03-13 – 2013-03-16 (×6): 100 mg via ORAL
  Filled 2013-03-13 (×7): qty 1

## 2013-03-13 MED ORDER — ASPIRIN EC 81 MG PO TBEC
81.0000 mg | DELAYED_RELEASE_TABLET | Freq: Every day | ORAL | Status: DC
  Administered 2013-03-13 – 2013-03-16 (×4): 81 mg via ORAL
  Filled 2013-03-13 (×5): qty 1

## 2013-03-13 MED ORDER — FUROSEMIDE 10 MG/ML IJ SOLN
40.0000 mg | Freq: Once | INTRAMUSCULAR | Status: AC
  Administered 2013-03-13: 40 mg via INTRAVENOUS
  Filled 2013-03-13: qty 4

## 2013-03-13 MED ORDER — DONEPEZIL HCL 10 MG PO TABS
10.0000 mg | ORAL_TABLET | Freq: Every day | ORAL | Status: DC
  Administered 2013-03-13 – 2013-03-15 (×3): 10 mg via ORAL
  Filled 2013-03-13 (×4): qty 1

## 2013-03-13 MED ORDER — INSULIN ASPART 100 UNIT/ML ~~LOC~~ SOLN
0.0000 [IU] | Freq: Three times a day (TID) | SUBCUTANEOUS | Status: DC
  Administered 2013-03-13 – 2013-03-14 (×2): 1 [IU] via SUBCUTANEOUS
  Administered 2013-03-14: 2 [IU] via SUBCUTANEOUS
  Administered 2013-03-15 – 2013-03-16 (×2): 3 [IU] via SUBCUTANEOUS

## 2013-03-13 MED ORDER — METOPROLOL TARTRATE 50 MG PO TABS
50.0000 mg | ORAL_TABLET | Freq: Two times a day (BID) | ORAL | Status: DC
  Administered 2013-03-13 – 2013-03-15 (×5): 50 mg via ORAL
  Filled 2013-03-13 (×7): qty 1

## 2013-03-13 MED ORDER — SIMVASTATIN 10 MG PO TABS
10.0000 mg | ORAL_TABLET | Freq: Every day | ORAL | Status: DC
  Administered 2013-03-13 – 2013-03-15 (×3): 10 mg via ORAL
  Filled 2013-03-13 (×5): qty 1

## 2013-03-13 MED ORDER — OMEGA-3-ACID ETHYL ESTERS 1 G PO CAPS
1.0000 g | ORAL_CAPSULE | Freq: Every day | ORAL | Status: DC
  Administered 2013-03-13 – 2013-03-16 (×4): 1 g via ORAL
  Filled 2013-03-13 (×4): qty 1

## 2013-03-13 MED ORDER — METOLAZONE 2.5 MG PO TABS
2.5000 mg | ORAL_TABLET | Freq: Every day | ORAL | Status: AC
  Administered 2013-03-13 – 2013-03-15 (×3): 2.5 mg via ORAL
  Filled 2013-03-13 (×4): qty 1

## 2013-03-13 MED ORDER — INSULIN GLARGINE 100 UNIT/ML ~~LOC~~ SOLN
24.0000 [IU] | Freq: Every evening | SUBCUTANEOUS | Status: DC
  Administered 2013-03-13 – 2013-03-15 (×3): 24 [IU] via SUBCUTANEOUS
  Filled 2013-03-13 (×4): qty 0.24

## 2013-03-13 MED ORDER — GABAPENTIN 600 MG PO TABS
300.0000 mg | ORAL_TABLET | Freq: Two times a day (BID) | ORAL | Status: DC
  Filled 2013-03-13: qty 1

## 2013-03-13 NOTE — ED Notes (Signed)
Pt presents to department for evaluation of CHF exacerbation. Daughter states patient is gaining weight and swelling all over body, more than usual. Pt states she is more SOB, especially on exertion. Respirations unlabored at the time. Speaking complete sentences. Denies chest pain. Does take Lasix at home.

## 2013-03-13 NOTE — H&P (Signed)
[facility]                                                                                    Patient Demographics  [name], is a 79 y.o. female  CSN: [id number]  MRN: [id number]  DOB - 03-28-1933  Admit Date - 03/13/2013  Outpatient Primary MD for the patient is [name], MD   With History of -  Past Medical History  Diagnosis Date  . Coronary artery disease 09/04/08    single vessel CABG at time of AVR   . Aortic stenosis 09/04/08    s/p AVR by Dr Tyrone Sage (tissue valve)  . Paroxysmal atrial fibrillation   . LBBB (left bundle branch block)   . CHF (congestive heart failure)     diastolic  . Sleep apnea   . Alzheimer's dementia   . Diabetes mellitus     type 2  . Hypertension   . Chronic anemia   . Osteoporosis   . Chronic renal failure   . Morbid obesity with BMI of 50.0-59.9, adult 03/09/2012      Past Surgical History  Procedure Laterality Date  . Coronary artery bypass graft  09/04/08    single vessel at time of AVR  . Aortic valve replacement  12/09    tissue valve by Dr Tyrone Sage  . Cholecystectomy  1995  . Cataract extraction    . Replacement total knee bilateral      L 2000, R 1997  . Appendectomy    . Closed reduction femoral shaft fracture  2007    R intramedullary rod  . Tee without cardioversion N/A 02/07/2013    Procedure: TRANSESOPHAGEAL ECHOCARDIOGRAM (TEE);  Surgeon: Donato Schultz, MD;  Location[facility] ENDOSCOPY;  Service: Cardiovascular;  Laterality: N/A;    in for   Chief Complaint  Patient presents with  . Congestive Heart Failure  . Edema     HPI  [name]  is a 79 y.o. female, with history of chronic diastolic CHF, CAD, aortic valve replacement which is nonmetallic, proximal atrial fibrillation on amiodarone, recent bacteremia for which she has finished her antibiotics, diabetes mellitus type 2, hypertension, Alzheimer's dementia comes to the hospital with gradually progressive edema in her legs, exertional shortness of  breath and weight gain, she tried calling her cardiologist office and got her Lasix dose increased without much benefit, she called her cardiologist office today and was told to come to the ER where she was diagnosed with acute on chronic diastolic CHF and I was called to admit the patient. She does not have any chest pain, no shortness of breath at rest, no fever chills no productive cough, no abdominal pain no diarrhea. She is to be compliant with her diet, fluid restriction and her medication regimen.    Review of Systems    In addition to the HPI above,   No Fever-chills, No Headache, No changes with Vision or hearing, No problems swallowing food or Liquids, No Chest pain, positive edema and exertional shortness of breath No Abdominal pain, No Nausea or Vommitting, Bowel movements are regular, No Blood in stool or Urine, No dysuria, No new skin rashes or bruises, No  new joints pains-aches,  No new weakness, tingling, numbness in any extremity, No recent weight  loss, No polyuria, polydypsia or polyphagia, No significant Mental Stressors.  A full 10 point Review of Systems was done, except as stated above, all other Review of Systems were negative.   Social History History  Substance Use Topics  . Smoking status: Never Smoker   . Smokeless tobacco: Never Used  . Alcohol Use: No      Family History Family History  Problem Relation Age of Onset  . Other Father 27    deceased. multiple MI   . Lung cancer Mother 25    died      Prior to Admission medications   Medication Sig Start Date End Date Taking? Authorizing Provider  acetaminophen (TYLENOL) 325 MG tablet Take 162.5 mg by mouth every 6 (six) hours as needed. For pain   Yes Dorothea Ogle, MD  amiodarone (PACERONE) 100 MG tablet Take 1 tablet (100 mg total) by mouth 2 (two) times daily. 02/09/13  Yes Donato Schultz, MD  aspirin EC 81 MG tablet Take 1 tablet (81 mg total) by mouth daily. 08/11/12  Yes Jacquelyn A McGill,  MD  diphenhydrAMINE-zinc acetate (BENADRYL) cream Apply 1 application topically 2 (two) times daily as needed. For itching 08/04/12 08/04/13 Yes Iskra Aurther Loft, MD  docusate sodium (COLACE) 100 MG capsule Take 1 capsule (100 mg total) by mouth 2 (two) times daily as needed. For constipation 08/14/12  Yes Brent Bulla, MD  donepezil (ARICEPT) 10 MG tablet Take 1 tablet (10 mg total) by mouth at bedtime. 08/11/12  Yes Jacquelyn A McGill, MD  ferrous sulfate 325 (65 FE) MG tablet Take 1 tablet (325 mg total) by mouth 2 (two) times daily. 08/11/12  Yes Jacquelyn A McGill, MD  fish oil-omega-3 fatty acids 1000 MG capsule Take 1 g by mouth daily. 08/04/12  Yes Dorothea Ogle, MD  furosemide (LASIX) 20 MG tablet Take 20 mg by mouth 2 (two) times daily.   Yes Historical Provider, MD  gabapentin (NEURONTIN) 600 MG tablet Take 300-600 mg by mouth 2 (two) times daily. Take 1/2 tab in the morning and 1 tab at bedtime 08/11/12  Yes Jacquelyn A McGill, MD  insulin glargine (LANTUS) 100 UNIT/ML injection Inject 24 Units into the skin every evening. 08/11/12  Yes Jacquelyn A McGill, MD  metolazone (ZAROXOLYN) 2.5 MG tablet Take 2.5 mg by mouth once.   Yes Historical Provider, MD  metoprolol (LOPRESSOR) 50 MG tablet Take 1 tablet (50 mg total) by mouth 2 (two) times daily. 02/09/13  Yes Donato Schultz, MD  Multiple Vitamin (MULTIVITAMIN) tablet Take 1 tablet by mouth daily.     Yes Historical Provider, MD  Multiple Vitamins-Minerals (PRESERVISION AREDS PO) Take 1 capsule by mouth daily.   Yes Historical Provider, MD  PARoxetine (PAXIL) 40 MG tablet Take 1 tablet (40 mg total) by mouth every morning. 08/04/12  Yes Dorothea Ogle, MD  polyethylene glycol [facility] Ethelene Hal) packet Take 17 g by mouth daily as needed. For constipation   Yes Historical Provider, MD  potassium chloride SA (K-DUR,KLOR-CON) 20 MEQ tablet Take 1 tablet (20 mEq total) by mouth 3 (three) times daily. 08/11/12  Yes Jacquelyn A McGill, MD  rifampin  (RIFADIN) 300 MG capsule Take 300 mg by mouth daily.   Yes Historical Provider, MD  Sennosides (SENNA) 15 MG TABS Take 1 tablet by mouth daily as needed. For constipation   Yes Historical Provider, MD  simvastatin (  ZOCOR) 10 MG tablet Take 1 tablet (10 mg total) by mouth at bedtime. 08/11/12  Yes Jacquelyn A McGill, MD  spironolactone (ALDACTONE) 12.5 mg TABS Take 0.5 tablets (12.5 mg total) by mouth 2 (two) times daily. 08/11/12  Yes Tito Dine, MD    Allergies  Allergen Reactions  . Morphine And Related Other (See Comments)    "went crazy"-Hallucinations  . Celecoxib Other (See Comments)    Sore mouth  . Wellbutrin (Bupropion) Other (See Comments)    Hallucinations  . Zyrtec (Cetirizine) Rash    Physical Exam  Vitals  Blood pressure 119/53, pulse 71, temperature 98 F (36.7 C), temperature source Oral, resp. rate 16, SpO2 97.00%.   1. General  elderly white female lying in bed in mild shortness of breath  2. Normal affect and insight, Not Suicidal or Homicidal, Awake Alert, Oriented X 2.  3. No F.N deficits, ALL C.Nerves Intact, Strength 5/5 all 4 extremities, Sensation intact all 4 extremities, Plantars down going.  4. Ears and Eyes appear Normal, Conjunctivae clear, PERRLA. Moist Oral Mucosa.  5. Supple Neck, No JVD, No cervical lymphadenopathy appriciated, No Carotid Bruits.  6. Symmetrical Chest wall movement, Good air movement bilaterally, CTAB.  7. RRR, No Gallops, Rubs or Murmurs, No Parasternal Heave.  8. Positive Bowel Sounds, Abdomen Soft, Non tender, No organomegaly appriciated,No rebound -guarding or rigidity.  9.  No Cyanosis, Normal Skin Turgor, No Skin Rash or Bruise. 2+ pedal edema  10. Good muscle tone,  joints appear normal , no effusions, Normal ROM.  11. No Palpable Lymph Nodes in Neck or Axillae     Data Review  CBC  Recent Labs Lab 03/13/13 1345  WBC 6.7  HGB 12.3  HCT 36.8  PLT 129*  MCV 93.2  MCH 31.1  MCHC 33.4  RDW  15.2  LYMPHSABS 1.3  MONOABS 0.8  EOSABS 0.2  BASOSABS 0.1   ------------------------------------------------------------------------------------------------------------------  Chemistries   Recent Labs Lab 03/13/13 1345  NA 133*  K 4.5  CL 96  CO2 24  GLUCOSE 182*  BUN 26*  CREATININE 1.18*  CALCIUM 8.9  AST 23  ALT 11  ALKPHOS 104  BILITOT 1.0   ------------------------------------------------------------------------------------------------------------------ CrCl is unknown because both a height and weight (above a minimum accepted value) are required for this calculation. ------------------------------------------------------------------------------------------------------------------ No results found for this basename: TSH, T4TOTAL, FREET3, T3FREE, THYROIDAB,  in the last 72 hours   Coagulation profile No results found for this basename: INR, PROTIME,  in the last 168 hours ------------------------------------------------------------------------------------------------------------------- No results found for this basename: DDIMER,  in the last 72 hours -------------------------------------------------------------------------------------------------------------------  Cardiac Enzymes  Recent Labs Lab 03/13/13 1345  TROPONINI <0.30   ------------------------------------------------------------------------------------------------------------------ No components found with this basename: POCBNP,    ---------------------------------------------------------------------------------------------------------------  Urinalysis    Component Value Date/Time   COLORURINE YELLOW 02/03/2013 0647   APPEARANCEUR CLEAR 02/03/2013 0647   LABSPEC 1.014 02/03/2013 0647   PHURINE 5.0 02/03/2013 0647   GLUCOSEU NEGATIVE 02/03/2013 0647   HGBUR NEGATIVE 02/03/2013 0647   BILIRUBINUR NEGATIVE 02/03/2013 0647   KETONESUR NEGATIVE 02/03/2013 0647   PROTEINUR NEGATIVE 02/03/2013 0647    UROBILINOGEN 0.2 02/03/2013 0647   NITRITE NEGATIVE 02/03/2013 0647   LEUKOCYTESUR TRACE* 02/03/2013 0647    ----------------------------------------------------------------------------------------------------------------  Imaging results:   Dg Chest 2 View  03/13/2013   *RADIOLOGY REPORT*  Clinical Data: Congestive heart failure  CHEST - 2 VIEW  Comparison: Feb 09, 2013  Findings:  There is cardiomegaly with mild pulmonary venous hypertension  and mild interstitial edema. There is no appreciable effusion or airspace consolidation.  No adenopathy.  The patient is status post aortic valve replacement.  There is calcification in the mitral annulus.  IMPRESSION: Evidence of a degree of congestive heart failure.  No airspace consolidation.   Original Report Authenticated By: Bretta Bang, M.D.    My personal review of EKG: Rhythm Afib, LBBB chronic    Assessment & Plan   1. Acute on chronic CHF diastolic along with mildly reduced EF per last echogram done early this year with EF of around 45-50%.- He will be admitted on a telemetry bed, she will be placed on fluid and salt restriction, she will be initiated on IV Lasix drip along with oral Zaroxolyn, we will monitor ins and outs closely, nitro paste will be applied, we will request cardiology to see her tomorrow if needed. Recent echo noted from early this year.   2. History of paroxysmal atrial fibrillation and aortic valve replacement. No acute issues, continue her on amiodarone along with her beta blocker and aspirin.   3. Diabetes mellitus type 2. We'll check A1c, home dose Lantus will be continued, will add sliding scale insulin q. a.c. at bedtime.   4. Recent bacteremia. She has finished her total 4 weeks of antibiotics prior to admission. No acute issues.   5. Mild dementia. Stable, mild confusion and delirium at this time and expected to continue during hospitalization, avoid benzodiazepine exposure, continue Aricept.    6.  Hypertension on the dyslipidemia. Stable home medications which include beta blocker, and statin will be continued.   DVT Prophylaxis Heparin    AM Labs Ordered, also please review Full Orders  Family Communication: Admission, patients condition and plan of care including tests being ordered have been discussed with the patient and daughter who indicate understanding and agree with the plan and Code Status.  Code Status no intubation  Likely DC to  home  Time spent in minutes : 35  Condition GUARDED   Leroy Sea M.D on 03/13/2013 at 3:21 PM  Between 7am to 7pm - Pager - [phone]  After 7pm go to [email or web address] - password [email or web address]  And look for the night coverage person covering me after hours  [facility] Office  [phone]

## 2013-03-13 NOTE — ED Provider Notes (Signed)
History     CSN: [id number]  Arrival date & time 03/13/13  1318   First MD Initiated Contact with Patient 03/13/13 1331      Chief Complaint  Patient presents with  . Congestive Heart Failure  . Edema     HPI Pt was seen at 1340.  Per pt and her family, c/o gradual onset and worsening of persistent SOB and peripheral edema for the past 2 to 3 weeks. Pt's daughter states pt has "gained 30 lbs in the past 2 to 3 weeks."  States "she is gaining at least 2 lbs per day."  Pt states the SOB worsens on exertion. Pt sleeps in a recliner chair at baseline. Pt's daughter states they called her Cards MD office and was told to come to the ED for further evaluation and admission for "IV diuresis."  Denies CP/palpitations, no cough, no abd pain, no N/V/D, no fevers, no back pain.   Cards: Dr. Anne Fu Past Medical History  Diagnosis Date  . Coronary artery disease 09/04/08    single vessel CABG at time of AVR   . Aortic stenosis 09/04/08    s/p AVR by Dr Tyrone Sage (tissue valve)  . Paroxysmal atrial fibrillation   . LBBB (left bundle branch block)   . CHF (congestive heart failure)     diastolic  . Sleep apnea   . Alzheimer's dementia   . Diabetes mellitus     type 2  . Hypertension   . Chronic anemia   . Osteoporosis   . Chronic renal failure   . Morbid obesity with BMI of 50.0-59.9, adult 03/09/2012    Past Surgical History  Procedure Laterality Date  . Coronary artery bypass graft  09/04/08    single vessel at time of AVR  . Aortic valve replacement  12/09    tissue valve by Dr Tyrone Sage  . Cholecystectomy  1995  . Cataract extraction    . Replacement total knee bilateral      L 2000, R 1997  . Appendectomy    . Closed reduction femoral shaft fracture  2007    R intramedullary rod  . Tee without cardioversion N/A 02/07/2013    Procedure: TRANSESOPHAGEAL ECHOCARDIOGRAM (TEE);  Surgeon: Donato Schultz, MD;  Location: [facility] ENDOSCOPY;  Service: Cardiovascular;  Laterality: N/A;    Family  History  Problem Relation Age of Onset  . Other Father 25    deceased. multiple MI   . Lung cancer Mother 31    died    History  Substance Use Topics  . Smoking status: Never Smoker   . Smokeless tobacco: Never Used  . Alcohol Use: No    Review of Systems ROS: Statement: All systems negative except as marked or noted in the HPI; Constitutional: Negative for fever and chills. ; ; Eyes: Negative for eye pain, redness and discharge. ; ; ENMT: Negative for ear pain, hoarseness, nasal congestion, sinus pressure and sore throat. ; ; Cardiovascular: Negative for chest pain, palpitations, diaphoresis, +dyspnea and peripheral edema. ; ; Respiratory: Negative for cough, wheezing and stridor. ; ; Gastrointestinal: Negative for nausea, vomiting, diarrhea, abdominal pain, blood in stool, hematemesis, jaundice and rectal bleeding. . ; ; Genitourinary: Negative for dysuria, flank pain and hematuria. ; ; Musculoskeletal: Negative for back pain and neck pain. Negative for swelling and trauma.; ; Skin: Negative for pruritus, rash, abrasions, blisters, bruising and skin lesion.; ; Neuro: Negative for headache, lightheadedness and neck stiffness. Negative for weakness, altered level of consciousness , altered  mental status, extremity weakness, paresthesias, involuntary movement, seizure and syncope.      Allergies  Morphine and related; Celecoxib; Wellbutrin; and Zyrtec  Home Medications   Current Outpatient Rx  Name  Route  Sig  Dispense  Refill  . acetaminophen (TYLENOL) 325 MG tablet   Oral   Take 162.5 mg by mouth every 6 (six) hours as needed. For pain         . amiodarone (PACERONE) 100 MG tablet   Oral   Take 1 tablet (100 mg total) by mouth 2 (two) times daily.   60 tablet   12   . aspirin EC 81 MG tablet   Oral   Take 1 tablet (81 mg total) by mouth daily.   31 tablet   0   . diphenhydrAMINE-zinc acetate (BENADRYL) cream   Topical   Apply 1 application topically 2 (two) times  daily as needed. For itching   28.4 g   1   . docusate sodium (COLACE) 100 MG capsule   Oral   Take 1 capsule (100 mg total) by mouth 2 (two) times daily as needed. For constipation   30 capsule   0   . donepezil (ARICEPT) 10 MG tablet   Oral   Take 1 tablet (10 mg total) by mouth at bedtime.   30 tablet   0   . ferrous sulfate 325 (65 FE) MG tablet   Oral   Take 1 tablet (325 mg total) by mouth 2 (two) times daily.   60 tablet   0   . fish oil-omega-3 fatty acids 1000 MG capsule   Oral   Take 1 g by mouth daily.         . furosemide (LASIX) 20 MG tablet   Oral   Take 20 mg by mouth 2 (two) times daily.         Marland Kitchen gabapentin (NEURONTIN) 600 MG tablet   Oral   Take 300-600 mg by mouth 2 (two) times daily. Take 1/2 tab in the morning and 1 tab at bedtime         . insulin glargine (LANTUS) 100 UNIT/ML injection   Subcutaneous   Inject 24 Units into the skin every evening.         . metolazone (ZAROXOLYN) 2.5 MG tablet   Oral   Take 2.5 mg by mouth once.         . metoprolol (LOPRESSOR) 50 MG tablet   Oral   Take 1 tablet (50 mg total) by mouth 2 (two) times daily.   60 tablet   12   . Multiple Vitamin (MULTIVITAMIN) tablet   Oral   Take 1 tablet by mouth daily.           . Multiple Vitamins-Minerals (PRESERVISION AREDS PO)   Oral   Take 1 capsule by mouth daily.         Marland Kitchen PARoxetine (PAXIL) 40 MG tablet   Oral   Take 1 tablet (40 mg total) by mouth every morning.   30 tablet   1   . polyethylene glycol (MIRALAX / GLYCOLAX) packet   Oral   Take 17 g by mouth daily as needed. For constipation         . potassium chloride SA (K-DUR,KLOR-CON) 20 MEQ tablet   Oral   Take 1 tablet (20 mEq total) by mouth 3 (three) times daily.   90 tablet   0   . rifampin (RIFADIN) 300 MG capsule  Oral   Take 300 mg by mouth daily.         . Sennosides (SENNA) 15 MG TABS   Oral   Take 1 tablet by mouth daily as needed. For constipation          . simvastatin (ZOCOR) 10 MG tablet   Oral   Take 1 tablet (10 mg total) by mouth at bedtime.   30 tablet   0   . spironolactone (ALDACTONE) 12.5 mg TABS   Oral   Take 0.5 tablets (12.5 mg total) by mouth 2 (two) times daily.   60 tablet   0     BP 119/53  Pulse 71  Temp(Src) 98 F (36.7 C) (Oral)  Resp 16  SpO2 97%  Physical Exam 1345: Physical examination:  Nursing notes reviewed; Vital signs and O2 SAT reviewed;  Constitutional: Well developed, Well nourished, Well hydrated, In no acute distress; Head:  Normocephalic, atraumatic; Eyes: EOMI, PERRL, No scleral icterus; ENMT: Mouth and pharynx normal, Mucous membranes moist; Neck: Supple, Full range of motion, No lymphadenopathy; Cardiovascular: Irregular irregular rate and rhythm, No gallop; Respiratory: Breath sounds clear & equal bilaterally, No wheezes.  Speaking full sentences with ease, Normal respiratory effort/excursion; Chest: Nontender, Movement normal; Abdomen: Soft, Nontender, Nondistended, Normal bowel sounds; Genitourinary: No CVA tenderness; Extremities: Pulses normal, No tenderness, +2 pedal edema bilat without calf asymmetry.; Neuro: AA&Ox3, vague historian. Major CN grossly intact.  Speech clear. No gross focal motor or sensory deficits in extremities.; Skin: Color normal, Warm, Dry.   ED Course  Procedures     MDM  MDM Reviewed: previous chart, nursing note and vitals Reviewed previous: labs and ECG Interpretation: labs, ECG and x-ray      Date: 03/13/2013  Rate: 68  Rhythm: atrial fibrillation  QRS Axis: left  Intervals: normal  ST/T Wave abnormalities: normal  Conduction Disutrbances:nonspecific intraventricular conduction delay  Narrative Interpretation:   Old EKG Reviewed: unchanged; no significant changes from previous EKG dated 02/02/2013.  Results for orders placed during the hospital encounter of 03/13/13  PRO B NATRIURETIC PEPTIDE      Result Value Range   Pro B Natriuretic peptide (BNP)  6455.0 (*) 0 - 450 pg/mL  CBC WITH DIFFERENTIAL      Result Value Range   WBC 6.7  4.0 - 10.5 K/uL   RBC 3.95  3.87 - 5.11 MIL/uL   Hemoglobin 12.3  12.0 - 15.0 g/dL   HCT 16.1  09.6 - 04.5 %   MCV 93.2  78.0 - 100.0 fL   MCH 31.1  26.0 - 34.0 pg   MCHC 33.4  30.0 - 36.0 g/dL   RDW 40.9  81.1 - 91.4 %   Platelets 129 (*) 150 - 400 K/uL   Neutrophils Relative % 65  43 - 77 %   Neutro Abs 4.3  1.7 - 7.7 K/uL   Lymphocytes Relative 19  12 - 46 %   Lymphs Abs 1.3  0.7 - 4.0 K/uL   Monocytes Relative 12  3 - 12 %   Monocytes Absolute 0.8  0.1 - 1.0 K/uL   Eosinophils Relative 3  0 - 5 %   Eosinophils Absolute 0.2  0.0 - 0.7 K/uL   Basophils Relative 1  0 - 1 %   Basophils Absolute 0.1  0.0 - 0.1 K/uL  COMPREHENSIVE METABOLIC PANEL      Result Value Range   Sodium 133 (*) 135 - 145 mEq/L   Potassium 4.5  3.5 -  5.1 mEq/L   Chloride 96  96 - 112 mEq/L   CO2 24  19 - 32 mEq/L   Glucose, Bld 182 (*) 70 - 99 mg/dL   BUN 26 (*) 6 - 23 mg/dL   Creatinine, Ser 4.09 (*) 0.50 - 1.10 mg/dL   Calcium 8.9  8.4 - 81.1 mg/dL   Total Protein 7.3  6.0 - 8.3 g/dL   Albumin 3.5  3.5 - 5.2 g/dL   AST 23  0 - 37 U/L   ALT 11  0 - 35 U/L   Alkaline Phosphatase 104  39 - 117 U/L   Total Bilirubin 1.0  0.3 - 1.2 mg/dL   GFR calc non Af Amer 43 (*) >90 mL/min   GFR calc Af Amer 50 (*) >90 mL/min  TROPONIN I      Result Value Range   Troponin I <0.30  <0.30 ng/mL   Dg Chest 2 View 03/13/2013   *RADIOLOGY REPORT*  Clinical Data: Congestive heart failure  CHEST - 2 VIEW  Comparison: Feb 09, 2013  Findings:  There is cardiomegaly with mild pulmonary venous hypertension and mild interstitial edema. There is no appreciable effusion or airspace consolidation.  No adenopathy.  The patient is status post aortic valve replacement.  There is calcification in the mitral annulus.  IMPRESSION: Evidence of a degree of congestive heart failure.  No airspace consolidation.   Original Report Authenticated By: Bretta Bang, M.D.    Results for LOZA, PRELL (MRN [id number]) as of 03/13/2013 15:06  Ref. Range 02/05/2013 05:10 02/06/2013 04:30 02/08/2013 09:23 03/13/2013 13:45  BUN Latest Range: 6-23 mg/dL 44 (H) 37 (H) 23 26 (H)  Creatinine Latest Range: 0.50-1.10 mg/dL 2.13 (H) 0.86 (H) 5.78 (H) 1.18 (H)    Results for JENNAMARIE, GOINGS (MRN [id number]) as of 03/13/2013 15:06  Ref. Range 02/02/2013 20:35 02/04/2013 10:50 03/13/2013 13:45  Pro B Natriuretic peptide (BNP) Latest Range: 0-450 pg/mL 4802.0 (H) 23347.0 (H) 6455.0 (H)    1500:  BNP elevated with evidence of CHF on CXR; will dose IV lasix. Dx and testing d/w pt and family.  Questions answered.  Verb understanding, agreeable to admit. T/C to [facility] Dr. Thedore Mins, case discussed, including:  HPI, pertinent PM/SHx, VS/PE, dx testing, ED course and treatment:  Agreeable to admit.      Laray Anger, DO 03/14/13 2140

## 2013-03-13 NOTE — ED Notes (Signed)
Patient requested CBG to be taken.

## 2013-03-14 LAB — GLUCOSE, CAPILLARY
Glucose-Capillary: 153 mg/dL — ABNORMAL HIGH (ref 70–99)
Glucose-Capillary: 141 mg/dL — ABNORMAL HIGH (ref 70–99)

## 2013-03-14 LAB — CBC
HCT: 33.1 % — ABNORMAL LOW (ref 36.0–46.0)
Hemoglobin: 11.2 g/dL — ABNORMAL LOW (ref 12.0–15.0)
MCH: 31.8 pg (ref 26.0–34.0)
MCHC: 33.8 g/dL (ref 30.0–36.0)
MCV: 94 fL (ref 78.0–100.0)
RDW: 15.2 % (ref 11.5–15.5)

## 2013-03-14 LAB — BASIC METABOLIC PANEL
BUN: 25 mg/dL — ABNORMAL HIGH (ref 6–23)
CO2: 31 mEq/L (ref 19–32)
Chloride: 97 mEq/L (ref 96–112)
Creatinine, Ser: 1.33 mg/dL — ABNORMAL HIGH (ref 0.50–1.10)
GFR calc Af Amer: 43 mL/min — ABNORMAL LOW (ref >90)
Glucose, Bld: 142 mg/dL — ABNORMAL HIGH (ref 70–99)
Potassium: 3.2 mEq/L — ABNORMAL LOW (ref 3.5–5.1)

## 2013-03-14 LAB — URINE CULTURE

## 2013-03-14 MED ORDER — POTASSIUM CHLORIDE CRYS ER 20 MEQ PO TBCR
40.0000 meq | EXTENDED_RELEASE_TABLET | Freq: Two times a day (BID) | ORAL | Status: AC
  Administered 2013-03-14 (×2): 40 meq via ORAL
  Filled 2013-03-14 (×2): qty 2

## 2013-03-14 MED ORDER — RIVAROXABAN 20 MG PO TABS
20.0000 mg | ORAL_TABLET | Freq: Every day | ORAL | Status: DC
  Filled 2013-03-14: qty 1

## 2013-03-14 MED ORDER — POTASSIUM CHLORIDE CRYS ER 20 MEQ PO TBCR
40.0000 meq | EXTENDED_RELEASE_TABLET | Freq: Every day | ORAL | Status: DC
  Administered 2013-03-15: 40 meq via ORAL
  Filled 2013-03-14 (×2): qty 2

## 2013-03-14 MED ORDER — FUROSEMIDE 10 MG/ML IJ SOLN
80.0000 mg | Freq: Two times a day (BID) | INTRAMUSCULAR | Status: DC
  Administered 2013-03-14 – 2013-03-16 (×5): 80 mg via INTRAVENOUS
  Filled 2013-03-14 (×6): qty 8

## 2013-03-14 MED ORDER — RIVAROXABAN 15 MG PO TABS
15.0000 mg | ORAL_TABLET | Freq: Every day | ORAL | Status: DC
  Administered 2013-03-14 – 2013-03-15 (×2): 15 mg via ORAL
  Filled 2013-03-14 (×4): qty 1

## 2013-03-14 NOTE — Care Management Note (Signed)
    Page 1 of 1   03/14/2013     2:00:22 PM   CARE MANAGEMENT NOTE 03/14/2013  Patient:  [name], [name]   Account Number:  [id number]  Date Initiated:  03/14/2013  Documentation initiated by:  Tera Mater  Subjective/Objective Assessment:   79yo female admitted with CHF.  Pt. lives at home with children.  Of noted, pt. was recently discharged from [facility] on 02/08/13.     Action/Plan:   discharge planning   Anticipated DC Date:  03/16/2013   Anticipated DC Plan:  HOME W HOME HEALTH SERVICES         Choice offered to / List presented to:          [facility] arranged  HH-1 RN  HH-10 DISEASE MANAGEMENT  HH-2 PT      HH agency  [facility]   Status of service:  In process, will continue to follow Medicare Important Message given?   (If response is "NO", the following Medicare IM given date fields will be blank) Date Medicare IM given:   Date Additional Medicare IM given:    Discharge Disposition:    Per UR Regulation:  Reviewed for med. necessity/level of care/duration of stay  If discussed at Long Length of Stay Meetings, dates discussed:    Comments:  03/14/13 1045 Pt. recently dc from [facility] with home health services with [facility] for [facility] RN, [facility] PT. TC to Lupita Leash, with [facility], to give referral for [facility] RN for Heart failure management and HHPT.  Currently pt. on IV Lasix gtt.  NCM will continue to follow. Tera Mater, RN, BSN NCM [phone]

## 2013-03-14 NOTE — Progress Notes (Signed)
Pt. Alert and oriented this am. Pt. Slept well during the night. Total ouptut from 7p-7a was 3950. Pt. Stated she feels better being able to get a good nights sleep.Pt. Alarms during the night for bradycardia. Pt. Resting well.  Pt. Denies pain. No distress or discomfort noted. RN will continue to monitor pt. For changes. Felipe Cabell, Cheryll Dessert

## 2013-03-14 NOTE — Evaluation (Signed)
Physical Therapy Evaluation Patient Details Name: [name] MRN: [id number] DOB: [birth year] Today's Date: 03/14/2013 Time: 4098-1191 PT Time Calculation (min): 26 min  PT Assessment / Plan / Recommendation Clinical Impression  Pt is a 79 yo female with CHF well known to this facility. Pt requires minA for transfers and ambulation but has the requisite 24/7 assistance available at home. Pt would benefit from HHPT to continue to improve independence with mobility and achieve modI for ADLs as family is available 24/7 short term but would prefer to provide assistance intermittently/PRN.    PT Assessment  Patient needs continued PT services    Follow Up Recommendations  Home health PT;Supervision/Assistance - 24 hour    Does the patient have the potential to tolerate intense rehabilitation      Barriers to Discharge        Equipment Recommendations       Recommendations for Other Services     Frequency Min 3X/week    Precautions / Restrictions Precautions Precautions: Fall Restrictions Weight Bearing Restrictions: No   Pertinent Vitals/Pain 2/10 knee pain      Mobility  Bed Mobility Bed Mobility: Supine to Sit;Sitting - Scoot to Edge of Bed Supine to Sit: 5: Supervision;HOB elevated;With rails Sitting - Scoot to Edge of Bed: 6: Modified independent (Device/Increase time);With rail Details for Bed Mobility Assistance: Pt requiring increased time, reports she sleeps in a lift chair at home Transfers Transfers: Sit to Stand;Stand to Sit Sit to Stand: 4: Min assist;With upper extremity assist;From bed Stand to Sit: 4: Min assist;To chair/3-in-1;With upper extremity assist Details for Transfer Assistance: minA to lift trunk and for balance, v/c's for hand placement Ambulation/Gait Ambulation/Gait Assistance: 4: Min assist Ambulation Distance (Feet): 100 Feet Assistive device: Rolling walker Ambulation/Gait Assistance Details: Pt required minA for safety with RW  manipulation. Pt lifting walker and placing on the ground instead of pushing it to turn Gait Pattern: Step-through pattern;Decreased stride length (increased lateral sway) Gait velocity: decreased    Exercises     PT Diagnosis: Difficulty walking  PT Problem List: Decreased activity tolerance;Decreased balance;Decreased mobility;Decreased knowledge of use of DME;Cardiopulmonary status limiting activity PT Treatment Interventions: DME instruction;Gait training;Functional mobility training;Therapeutic activities;Therapeutic exercise;Balance training   PT Goals Acute Rehab PT Goals PT Goal Formulation: With patient Time For Goal Achievement: 03/28/13 Potential to Achieve Goals: Good Pt will go Supine/Side to Sit: with modified independence;with HOB 0 degrees PT Goal: Supine/Side to Sit - Progress: Goal set today Pt will go Sit to Stand: with supervision;with upper extremity assist PT Goal: Sit to Stand - Progress: Goal set today Pt will Transfer Bed to Chair/Chair to Bed: with supervision PT Transfer Goal: Bed to Chair/Chair to Bed - Progress: Goal set today Pt will Ambulate: 51 - 150 feet;with supervision;with least restrictive assistive device PT Goal: Ambulate - Progress: Goal set today Pt will Perform Home Exercise Program: with supervision, verbal cues required/provided PT Goal: Perform Home Exercise Program - Progress: Goal set today  Visit Information  Last PT Received On: 03/14/13 Assistance Needed: +1    Subjective Data  Subjective: Pt recieved in bed agreeable to PT   Prior Functioning  Home Living Lives With: Daughter;Family Available Help at Discharge: Available 24 hours/day;Personal care attendant (4 times a week for 2 hours) Type of Home: House Home Access: Ramped entrance Home Layout: One level Bathroom Shower/Tub: Tub/shower unit Bathroom Accessibility: No Home Adaptive Equipment: Hand-held shower hose;Raised toilet seat with rails;Tub transfer bench;Walker -  four wheeled;Bedside commode/3-in-1 (lift chair)  Additional Comments: walker doe snot fit in kitchen or bathroom Prior Function Level of Independence: Needs assistance Needs Assistance: Bathing;Dressing;Toileting;Meal Prep;Light Housekeeping;Grooming Bath: Moderate Dressing: Moderate Grooming: Moderate Toileting: Moderate Meal Prep: Moderate Light Housekeeping: Maximal Able to Take Stairs?: No Driving: No Vocation: Retired Comments: meals on wheels, granddaughter close by Communication Communication: No difficulties Dominant Hand: Right    Cognition  Cognition Arousal/Alertness: Awake/alert Behavior During Therapy: WFL for tasks assessed/performed Overall Cognitive Status: Within Functional Limits for tasks assessed    Extremity/Trunk Assessment Right Upper Extremity Assessment RUE ROM/Strength/Tone: [facility] for tasks assessed Left Upper Extremity Assessment LUE ROM/Strength/Tone: WFL for tasks assessed Right Lower Extremity Assessment RLE ROM/Strength/Tone: [facility] for tasks assessed Left Lower Extremity Assessment LLE ROM/Strength/Tone: WFL for tasks assessed   Balance    End of Session PT - End of Session Equipment Utilized During Treatment: Gait belt Activity Tolerance: Patient tolerated treatment well Patient left: in chair;with call bell/phone within reach Nurse Communication: Mobility status  GP     03/14/2013, 9:22 AM Marvis Moeller, Student Physical Therapist Office #: [phone]   Agree with above assessment.  Lewis Shock, PT, DPT Pager #: [phone] Office #: [phone]

## 2013-03-14 NOTE — Progress Notes (Signed)
[facility]                                                                                Patient Demographics  [name], is a 79 y.o. female, DOB - 07-27-1933, NWG:[id number], [id number]:[id number]  Admit date - 03/13/2013  Admitting Physician Leroy Sea, MD  Outpatient Primary MD for the patient is [name], MD  LOS - 1   Chief Complaint  Patient presents with  . Congestive Heart Failure  . Edema        Assessment & Plan    1. Acute on chronic CHF diastolic along with mildly reduced EF per last echogram done early this year with EF of around 45-50%.- she is much improved after IV Lasix drip and Zaroxolyn, she was seen by cardiologist Dr. Chales Abrahams today, continue diuresis, she feels much better,continue beta blocker and nitro paste. Recent echo noted from early this year.   Echo 01/2013  - Left ventricle: The cavity size was normal. Wall thickness was normal. Systolic function was mildly reduced. The estimated ejection fraction was in the range of 45% to 50%. - Aortic valve: A prosthesis was present and functioning normally. The prosthesis had a normal range of motion. The sewing ring appeared normal, had no rocking motion, and showed no evidence of dehiscence. - Mitral valve: Moderately calcified annulus. Mildly thickened leaflets . No evidence of vegetation. Mild regurgitation. - Left atrium: No evidence of thrombus in the atrial cavity or appendage. No evidence of thrombus in the appendage. - Right atrium: No evidence of thrombus in the atrial cavity or appendage. - Atrial septum: No defect or patent foramen ovale was identified. - Tricuspid valve: No evidence of vegetation. No evidence of vegetation. - Pulmonic valve: No evidence of vegetation. - Pulmonary arteries: Systolic pressure was moderately increased. - Superior vena cava: The study excluded a thrombus.  Impressions:  - No evidence of endocarditis. Vegetation is absent.    2. History of  paroxysmal atrial fibrillation and aortic valve replacement. No acute issues, continue her on amiodarone along with her beta blocker, aspirin  xaralto has been added today by cardiology.    3. Diabetes mellitus type 2.  home dose Lantus will be continued, will add sliding scale insulin q. a.c. at bedtime.    Lab Results  Component Value Date   HGBA1C 6.9* 03/13/2013    CBG (last 3)   Recent Labs  03/13/13 2132 03/14/13 0644 03/14/13 1122  GLUCAP 108* 117* 153*      4. Recent bacteremia. She has finished her total 4 weeks of antibiotics prior to admission. No acute issues.     5. Mild dementia. Stable, mild confusion and delirium at this time and expected to continue during hospitalization, avoid benzodiazepine exposure, continue Aricept.     6. Hypertension on the dyslipidemia. Stable home medications which include beta blocker, and statin will be continued.     Code Status: partial  Family Communication: daughter  Disposition Plan: home   Procedures     Consults  [facility]   DVT Prophylaxis  xaralto  Lab Results  Component Value Date   PLT 106* 03/14/2013    Medications  Scheduled Meds: . amiodarone  100 mg Oral BID  . aspirin EC  81 mg Oral Daily  . docusate sodium  100 mg Oral BID  . donepezil  10 mg Oral QHS  . ferrous sulfate  325 mg Oral BID  . furosemide  80 mg Intravenous BID  . gabapentin  300 mg Oral Daily  . gabapentin  600 mg Oral QHS  . insulin aspart  0-5 Units Subcutaneous QHS  . insulin aspart  0-9 Units Subcutaneous TID WC  . insulin glargine  24 Units Subcutaneous QPM  . metolazone  2.5 mg Oral Daily  . metoprolol  50 mg Oral BID  . omega-3 acid ethyl esters  1 g Oral Daily  . PARoxetine  40 mg Oral BH-q7a  . potassium chloride  40 mEq Oral BID  . [START ON 03/15/2013] potassium chloride  40 mEq Oral Daily  . rivaroxaban  15 mg Oral Q supper  . simvastatin  10 mg Oral QHS  . sodium chloride  3 mL Intravenous Q12H  .  spironolactone  12.5 mg Oral BID   Continuous Infusions:  PRN Meds:.albuterol, alum & mag hydroxide-simeth, guaiFENesin-dextromethorphan, HYDROcodone-acetaminophen, ondansetron (ZOFRAN) IV, ondansetron, polyethylene glycol  Antibiotics    Anti-infectives   None       Time Spent in minutes   35   Susa Raring K M.D on 03/14/2013 at 12:27 PM  Between 7am to 7pm - Pager - [phone]  After 7pm go to [email or web address] - password [email or web address]  And look for the night coverage person covering for me after hours  [facility] Office  [phone]    Subjective:   [facility] today has, No headache, No chest pain, No abdominal pain - No Nausea, No new weakness tingling or numbness, No Cough - SOB.   Objective:   Filed Vitals:   03/13/13 1651 03/13/13 2003 03/14/13 0517 03/14/13 0946  BP: 112/63 106/65 100/56 118/62  Pulse: 56 67 61 70  Temp: 98.2 F (36.8 C) 97.5 F (36.4 C) 98.1 F (36.7 C)   TempSrc: Oral Oral Oral   Resp: 20 16 18    Height: 5\' 3"  (1.6 m)     Weight: 106.1 kg (233 lb 14.5 oz)  103.012 kg (227 lb 1.6 oz)   SpO2: 100% 95% 94%     Wt Readings from Last 3 Encounters:  03/14/13 103.012 kg (227 lb 1.6 oz)  02/09/13 97.16 kg (214 lb 3.2 oz)  02/09/13 97.16 kg (214 lb 3.2 oz)     Intake/Output Summary (Last 24 hours) at 03/14/13 1227 Last data filed at 03/14/13 0951  Gross per 24 hour  Intake    480 ml  Output   5300 ml  Net  -4820 ml    Exam Awake Alert, Oriented X 3, No new F.N deficits, Normal affect [place].AT,PERRAL Supple Neck,No JVD, No cervical lymphadenopathy appriciated.  Symmetrical Chest wall movement, Good air movement bilaterally, CTAB RRR,No Gallops,Rubs or new Murmurs, No Parasternal Heave +ve B.Sounds, Abd Soft, Non tender, No organomegaly appriciated, No rebound - guarding or rigidity. No Cyanosis, Clubbing , 2+edema, No new Rash or bruise     Data Review   Micro Results Recent Results (from the past 240 hour(s))  MRSA  PCR SCREENING     Status: None   Collection Time    03/13/13  5:11 PM      Result Value Range Status   MRSA by PCR NEGATIVE  NEGATIVE Final   Comment:  The GeneXpert MRSA Assay (FDA     approved for NASAL specimens     only), is one component of a     comprehensive MRSA colonization     surveillance program. It is not     intended to diagnose MRSA     infection nor to guide or     monitor treatment for     MRSA infections.    Radiology Reports Dg Chest 2 View  03/13/2013   *RADIOLOGY REPORT*  Clinical Data: Congestive heart failure  CHEST - 2 VIEW  Comparison: Feb 09, 2013  Findings:  There is cardiomegaly with mild pulmonary venous hypertension and mild interstitial edema. There is no appreciable effusion or airspace consolidation.  No adenopathy.  The patient is status post aortic valve replacement.  There is calcification in the mitral annulus.  IMPRESSION: Evidence of a degree of congestive heart failure.  No airspace consolidation.   Original Report Authenticated By: Bretta Bang, M.D.    CBC  Recent Labs Lab 03/13/13 1345 03/13/13 1743 03/14/13 0427  WBC 6.7 5.8 6.4  HGB 12.3 11.8* 11.2*  HCT 36.8 34.9* 33.1*  PLT 129* 120* 106*  MCV 93.2 93.8 94.0  MCH 31.1 31.7 31.8  MCHC 33.4 33.8 33.8  RDW 15.2 15.1 15.2  LYMPHSABS 1.3  --   --   MONOABS 0.8  --   --   EOSABS 0.2  --   --   BASOSABS 0.1  --   --     Chemistries   Recent Labs Lab 03/13/13 1345 03/13/13 1743 03/14/13 0427  NA 133*  --  137  K 4.5  --  3.2*  CL 96  --  97  CO2 24  --  31  GLUCOSE 182*  --  142*  BUN 26*  --  25*  CREATININE 1.18* 1.15* 1.33*  CALCIUM 8.9  --  8.9  AST 23  --   --   ALT 11  --   --   ALKPHOS 104  --   --   BILITOT 1.0  --   --    ------------------------------------------------------------------------------------------------------------------ estimated creatinine clearance is 40.6 ml/min (by C-G formula based on Cr of  1.33). ------------------------------------------------------------------------------------------------------------------  Recent Labs  03/13/13 1743  HGBA1C 6.9*   ------------------------------------------------------------------------------------------------------------------ No results found for this basename: CHOL, HDL, LDLCALC, TRIG, CHOLHDL, LDLDIRECT,  in the last 72 hours ------------------------------------------------------------------------------------------------------------------  Recent Labs  03/13/13 1743  TSH 3.515   ------------------------------------------------------------------------------------------------------------------ No results found for this basename: VITAMINB12, FOLATE, FERRITIN, TIBC, IRON, RETICCTPCT,  in the last 72 hours  Coagulation profile  Recent Labs Lab 03/13/13 1743  INR 1.20    No results found for this basename: DDIMER,  in the last 72 hours  Cardiac Enzymes  Recent Labs Lab 03/13/13 1345  TROPONINI <0.30   ------------------------------------------------------------------------------------------------------------------ No components found with this basename: POCBNP,

## 2013-03-14 NOTE — Evaluation (Signed)
Physical Therapy Evaluation Patient Details Name: [name] MRN: [id number] DOB: 05/21/1933 Today's Date: 03/14/2013 Time: 9147-8295 PT Time Calculation (min): 26 min  PT Assessment / Plan / Recommendation Clinical Impression  Pt is a 79 yo female with CHF well known to this SPT. Pt requires minA for transfers and ambulation but has the requisite 24/7 assistance available at home. Pt would benefit from HHPT to continue to improve independence with mobility and achieve modI for ADLs as family is available 24/7 short term but would prefer to provide assistance intermittently/PRN.    PT Assessment  Patient needs continued PT services    Follow Up Recommendations  Home health PT;Supervision/Assistance - 24 hour    Does the patient have the potential to tolerate intense rehabilitation      Barriers to Discharge        Equipment Recommendations       Recommendations for Other Services     Frequency Min 3X/week    Precautions / Restrictions Precautions Precautions: Fall Restrictions Weight Bearing Restrictions: No   Pertinent Vitals/Pain 2/10 knee pain      Mobility  Bed Mobility Bed Mobility: Supine to Sit;Sitting - Scoot to Edge of Bed Supine to Sit: 5: Supervision;HOB elevated;With rails Sitting - Scoot to Edge of Bed: 6: Modified independent (Device/Increase time);With rail Details for Bed Mobility Assistance: Pt requiring increased time, reports she sleeps in a lift chair at home Transfers Transfers: Sit to Stand;Stand to Sit Sit to Stand: 4: Min assist;With upper extremity assist;From bed Stand to Sit: 4: Min assist;To chair/3-in-1;With upper extremity assist Details for Transfer Assistance: minA to lift trunk and for balance, v/c's for hand placement Ambulation/Gait Ambulation/Gait Assistance: 4: Min assist Ambulation Distance (Feet): 100 Feet Assistive device: Rolling walker Ambulation/Gait Assistance Details: Pt required minA for safety with RW manipulation.  Pt lifting walker and placing on the ground instead of pushing it to turn Gait Pattern: Step-through pattern;Decreased stride length (increased lateral sway) Gait velocity: decreased    Exercises     PT Diagnosis: Difficulty walking  PT Problem List: Decreased activity tolerance;Decreased balance;Decreased mobility;Decreased knowledge of use of DME;Cardiopulmonary status limiting activity PT Treatment Interventions: DME instruction;Gait training;Functional mobility training;Therapeutic activities;Therapeutic exercise;Balance training   PT Goals Acute Rehab PT Goals PT Goal Formulation: With patient Time For Goal Achievement: 03/28/13 Potential to Achieve Goals: Good Pt will go Supine/Side to Sit: with modified independence;with HOB 0 degrees PT Goal: Supine/Side to Sit - Progress: Goal set today Pt will go Sit to Stand: with supervision;with upper extremity assist PT Goal: Sit to Stand - Progress: Goal set today Pt will Transfer Bed to Chair/Chair to Bed: with supervision PT Transfer Goal: Bed to Chair/Chair to Bed - Progress: Goal set today Pt will Ambulate: 51 - 150 feet;with supervision;with least restrictive assistive device PT Goal: Ambulate - Progress: Goal set today Pt will Perform Home Exercise Program: with supervision, verbal cues required/provided PT Goal: Perform Home Exercise Program - Progress: Goal set today  Visit Information  Last PT Received On: 03/14/13 Assistance Needed: +1    Subjective Data  Subjective: Pt recieved in bed agreeable to PT   Prior Functioning  Home Living Lives With: Daughter;Family Available Help at Discharge: Available 24 hours/day;Personal care attendant (4 times a week for 2 hours) Type of Home: House Home Access: Ramped entrance Home Layout: One level Bathroom Shower/Tub: Tub/shower unit Bathroom Accessibility: No Home Adaptive Equipment: Hand-held shower hose;Raised toilet seat with rails;Tub transfer bench;Walker - four  wheeled;Bedside commode/3-in-1 (lift chair)  Additional Comments: walker doe snot fit in kitchen or bathroom Prior Function Level of Independence: Needs assistance Needs Assistance: Bathing;Dressing;Toileting;Meal Prep;Light Housekeeping;Grooming Bath: Moderate Dressing: Moderate Grooming: Moderate Toileting: Moderate Meal Prep: Moderate Light Housekeeping: Maximal Able to Take Stairs?: No Driving: No Vocation: Retired Comments: meals on wheels, granddaughter close by Communication Communication: No difficulties Dominant Hand: Right    Cognition  Cognition Arousal/Alertness: Awake/alert Behavior During Therapy: WFL for tasks assessed/performed Overall Cognitive Status: Within Functional Limits for tasks assessed    Extremity/Trunk Assessment Right Upper Extremity Assessment RUE ROM/Strength/Tone: [facility] for tasks assessed Left Upper Extremity Assessment LUE ROM/Strength/Tone: WFL for tasks assessed Right Lower Extremity Assessment RLE ROM/Strength/Tone: [facility] for tasks assessed Left Lower Extremity Assessment LLE ROM/Strength/Tone: WFL for tasks assessed   Balance    End of Session PT - End of Session Equipment Utilized During Treatment: Gait belt Activity Tolerance: Patient tolerated treatment well Patient left: in chair;with call bell/phone within reach Nurse Communication: Mobility status  GP     03/14/2013, 9:22 AM Marvis Moeller, Student Physical Therapist Office #: [phone]

## 2013-03-14 NOTE — Progress Notes (Signed)
UR COMPLETED  

## 2013-03-14 NOTE — Consult Note (Addendum)
Admit date: 03/13/2013 Referring Physician  Dr. Thedore Mins Primary Physician [name], MD Primary Cardiologist  Dr. Anne Fu Reason for Consultation  Acute on chronic heart failure  HPI: 79 year old with coronary artery disease status post bypass as well as aortic valve replacement, bioprosthetic with paroxysmal atrial fibrillation, left bundle branch block chronic, chronic diastolic heart failure, exertional shortness of breath with issues in the past with dietary indiscretions, fluid indiscretions he would increase shortness of breath consistent with acute on chronic diastolic heart failure. I had seen her recently for TEE to ensure that she did not have any evidence of bacterial endocarditis, this was reassuring.  Over the last several days she has had increasing shortness of breath mostly with exertion with progressive edema in her legs. She is tried increasing oral Lasix dose without much benefit. We have had issues with this in the past at home. We have had issues with hypokalemia in the past as well.    PMH:   Past Medical History  Diagnosis Date  . Coronary artery disease 09/04/08    single vessel CABG at time of AVR   . Aortic stenosis 09/04/08    s/p AVR by Dr Tyrone Sage (tissue valve)  . Paroxysmal atrial fibrillation   . LBBB (left bundle branch block)   . CHF (congestive heart failure)     diastolic  . Sleep apnea   . Alzheimer's dementia   . Diabetes mellitus     type 2  . Hypertension   . Chronic anemia   . Osteoporosis   . Chronic renal failure   . Morbid obesity with BMI of 50.0-59.9, adult 03/09/2012  . Peripheral vascular disease   . Arthritis     PSH:   Past Surgical History  Procedure Laterality Date  . Coronary artery bypass graft  09/04/08    single vessel at time of AVR  . Aortic valve replacement  12/09    tissue valve by Dr Tyrone Sage  . Cholecystectomy  1995  . Cataract extraction    . Replacement total knee bilateral      L 2000, R 1997  . Appendectomy     . Closed reduction femoral shaft fracture  2007    R intramedullary rod  . Tee without cardioversion N/A 02/07/2013    Procedure: TRANSESOPHAGEAL ECHOCARDIOGRAM (TEE);  Surgeon: Donato Schultz, MD;  Location: [facility] ENDOSCOPY;  Service: Cardiovascular;  Laterality: N/A;   Allergies:  Morphine and related; Celecoxib; Wellbutrin; and Zyrtec Prior to Admit Meds:   Prescriptions prior to admission  Medication Sig Dispense Refill  . acetaminophen (TYLENOL) 325 MG tablet Take 162.5 mg by mouth every 6 (six) hours as needed. For pain      . amiodarone (PACERONE) 100 MG tablet Take 1 tablet (100 mg total) by mouth 2 (two) times daily.  60 tablet  12  . aspirin EC 81 MG tablet Take 1 tablet (81 mg total) by mouth daily.  31 tablet  0  . diphenhydrAMINE-zinc acetate (BENADRYL) cream Apply 1 application topically 2 (two) times daily as needed. For itching  28.4 g  1  . docusate sodium (COLACE) 100 MG capsule Take 1 capsule (100 mg total) by mouth 2 (two) times daily as needed. For constipation  30 capsule  0  . donepezil (ARICEPT) 10 MG tablet Take 1 tablet (10 mg total) by mouth at bedtime.  30 tablet  0  . ferrous sulfate 325 (65 FE) MG tablet Take 1 tablet (325 mg total) by mouth 2 (two) times daily.  60 tablet  0  . fish oil-omega-3 fatty acids 1000 MG capsule Take 1 g by mouth daily.      . furosemide (LASIX) 20 MG tablet Take 20 mg by mouth 2 (two) times daily.      Marland Kitchen gabapentin (NEURONTIN) 600 MG tablet Take 300-600 mg by mouth 2 (two) times daily. Take 1/2 tab in the morning and 1 tab at bedtime      . insulin glargine (LANTUS) 100 UNIT/ML injection Inject 24 Units into the skin every evening.      . metolazone (ZAROXOLYN) 2.5 MG tablet Take 2.5 mg by mouth once.      . metoprolol (LOPRESSOR) 50 MG tablet Take 1 tablet (50 mg total) by mouth 2 (two) times daily.  60 tablet  12  . Multiple Vitamin (MULTIVITAMIN) tablet Take 1 tablet by mouth daily.        . Multiple Vitamins-Minerals (PRESERVISION  AREDS PO) Take 1 capsule by mouth daily.      Marland Kitchen PARoxetine (PAXIL) 40 MG tablet Take 1 tablet (40 mg total) by mouth every morning.  30 tablet  1  . polyethylene glycol (MIRALAX / GLYCOLAX) packet Take 17 g by mouth daily as needed. For constipation      . potassium chloride SA (K-DUR,KLOR-CON) 20 MEQ tablet Take 1 tablet (20 mEq total) by mouth 3 (three) times daily.  90 tablet  0  . rifampin (RIFADIN) 300 MG capsule Take 300 mg by mouth daily.      . Sennosides (SENNA) 15 MG TABS Take 1 tablet by mouth daily as needed. For constipation      . simvastatin (ZOCOR) 10 MG tablet Take 1 tablet (10 mg total) by mouth at bedtime.  30 tablet  0  . spironolactone (ALDACTONE) 12.5 mg TABS Take 0.5 tablets (12.5 mg total) by mouth 2 (two) times daily.  60 tablet  0   Fam HX:    Family History  Problem Relation Age of Onset  . Other Father 71    deceased. multiple MI   . Lung cancer Mother 70    died   Social HX:    History   Social History  . Marital Status: Widowed    Spouse Name: N/A    Number of Children: N/A  . Years of Education: N/A   Occupational History  . retired     lives with her daughter   Social History Main Topics  . Smoking status: Never Smoker   . Smokeless tobacco: Never Used  . Alcohol Use: No  . Drug Use: No  . Sexually Active: No   Other Topics Concern  . Not on file   Social History Narrative   Lives in [place] with daughter, [name], who is an Charity fundraiser.     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   Widowed 2003     ROS: Denies any bleeding, strokelike symptoms, syncope, significant palpitations, orthopnea, PND. Positive shortness of breath, edema. All 11 ROS were addressed and are negative except what is stated in the HPI  Physical Exam: Blood pressure 118/62, pulse 70, temperature 98.1 F (36.7 C), temperature source Oral, resp. rate 18, height 5\' 3"  (1.6 m), weight 103.012 kg (227 lb 1.6 oz), SpO2 94.00%.    General: Well developed,  well nourished, in no acute distress Head: Eyes PERRLA, No xanthomas.   Normal cephalic and atramatic  Lungs:   Clear bilaterally to auscultation and percussion. Normal respiratory effort. No wheezes,  no rales. Heart:   Irregularly irregular Pulses are 2+ & equal. No murmur, no significant JVD  No carotid bruit.   No abdominal bruits.  Abdomen: Bowel sounds are positive, abdomen soft and non-tender without masses. No hepatosplenomegaly. Obese Msk:  Back normal. Normal strength and tone for age. Extremities:   No clubbing, cyanosis. 1-2+ lower extremity edema.  DP +1 Neuro: Alert and oriented X 3, non-focal, MAE x 4 GU: Deferred Rectal: Deferred Psych:  Good affect, responds appropriately    Labs:   Lab Results  Component Value Date   WBC 6.4 03/14/2013   HGB 11.2* 03/14/2013   HCT 33.1* 03/14/2013   MCV 94.0 03/14/2013   PLT 106* 03/14/2013    Recent Labs Lab 03/13/13 1345  03/14/13 0427  NA 133*  --  137  K 4.5  --  3.2*  CL 96  --  97  CO2 24  --  31  BUN 26*  --  25*  CREATININE 1.18*  < > 1.33*  CALCIUM 8.9  --  8.9  PROT 7.3  --   --   BILITOT 1.0  --   --   ALKPHOS 104  --   --   ALT 11  --   --   AST 23  --   --   GLUCOSE 182*  --  142*  < > = values in this interval not displayed. No results found for this basename: PTT   Lab Results  Component Value Date   INR 1.20 03/13/2013   INR 1.04 02/02/2013   INR 1.18 08/01/2012   Lab Results  Component Value Date   CKTOTAL 42 04/12/2012   CKMB 1.4 04/12/2012   TROPONINI <0.30 03/13/2013     Lab Results  Component Value Date   CHOL 143 02/03/2013   CHOL  Value: 111        ATP III CLASSIFICATION:  <200     mg/dL   Desirable  161-096  mg/dL   Borderline High  >=045    mg/dL   High 40/05/8118   Lab Results  Component Value Date   HDL 74 02/03/2013   HDL 42 14/03/8294   Lab Results  Component Value Date   LDLCALC 62 02/03/2013   LDLCALC  Value: 54        Total Cholesterol/HDL:CHD Risk Coronary Heart Disease Risk Table                      Men   Women  1/2 Average Risk   3.4   3.3 08/30/2008   Lab Results  Component Value Date   TRIG 34 02/03/2013   TRIG 76 08/30/2008   Lab Results  Component Value Date   CHOLHDL 1.9 02/03/2013   CHOLHDL 2.6 08/30/2008   No results found for this basename: LDLDIRECT      Radiology:  Dg Chest 2 View  03/13/2013   *RADIOLOGY REPORT*  Clinical Data: Congestive heart failure  CHEST - 2 VIEW  Comparison: Feb 09, 2013  Findings:  There is cardiomegaly with mild pulmonary venous hypertension and mild interstitial edema. There is no appreciable effusion or airspace consolidation.  No adenopathy.  The patient is status post aortic valve replacement.  There is calcification in the mitral annulus.  IMPRESSION: Evidence of a degree of congestive heart failure.  No airspace consolidation.   Original Report Authenticated By: Bretta Bang, M.D.   Personally viewed.  EKG: Atrial fibrillation, heart rate 68, left bundle  branch block  Personally viewed.   ASSESSMENT/PLAN:   79 year old with acute on chronic diastolic heart failure, obesity, coronary artery disease status post bypass, bioprosthetic aortic valve replacement, chronic left bundle branch block, atrial fibrillation.  1. Acute on chronic diastolic heart failure-EF approximately 45-50%. IV Lasix drip was initiated as well as oral Zaroxolyn. She is diuresed approximately 4.8 L in total, net. Replete potassium. Creatinine from 1.18 to 1.33. Sodium maintaining at 137. Once again discussed not drinking too much cranberry juice at home.  Let us continue today with IV furosemide 80 mg IV twice a day.  Repleting potassium as above.  2. Coronary artery disease appears stable 3. Aortic valve bioprosthetic is stable.  4. Atrial fibrillation - She warrants anticoagulation given her elevated risk. She is at elevated risk for stroke. Will start Xarelto 20mg  PO QD. Discussed with her bleeding risks. In the past, following bypass, she was on  Coumadin transiently. She does not require anticoagulation for her bioprosthetic aortic valve nor do I believe that her bioprosthetic aortic valve is contributing to her atrial fibrillation. I do not think it is unreasonable to utilize Xarelto in this setting especially given the possible compliance issues with Coumadin.  5. Transient bradycardia-asymptomatic, no syncope. With left bundle branch block. She has seen Dr. Johney Frame in the past. Did not want to have pacemaker placed at that time. We will continue with low-dose amiodarone. She has also had issues with atrial tachycardias in the past.  Discussed with referring team.    Donato Schultz, MD  03/14/2013  10:26 AM

## 2013-03-15 LAB — BASIC METABOLIC PANEL WITH GFR
BUN: 30 mg/dL — ABNORMAL HIGH (ref 6–23)
CO2: 35 meq/L — ABNORMAL HIGH (ref 19–32)
Calcium: 9.4 mg/dL (ref 8.4–10.5)
Chloride: 96 meq/L (ref 96–112)
Creatinine, Ser: 1.29 mg/dL — ABNORMAL HIGH (ref 0.50–1.10)
GFR calc Af Amer: 45 mL/min — ABNORMAL LOW
GFR calc non Af Amer: 39 mL/min — ABNORMAL LOW
Glucose, Bld: 118 mg/dL — ABNORMAL HIGH (ref 70–99)
Potassium: 3.7 meq/L (ref 3.5–5.1)
Sodium: 139 meq/L (ref 135–145)

## 2013-03-15 LAB — GLUCOSE, CAPILLARY
Glucose-Capillary: 220 mg/dL — ABNORMAL HIGH (ref 70–99)
Glucose-Capillary: 117 mg/dL — ABNORMAL HIGH (ref 70–99)

## 2013-03-15 LAB — MAGNESIUM: Magnesium: 1.9 mg/dL (ref 1.5–2.5)

## 2013-03-15 MED ORDER — MAGNESIUM SULFATE IN D5W 10-5 MG/ML-% IV SOLN
1.0000 g | Freq: Once | INTRAVENOUS | Status: AC
  Administered 2013-03-15: 1 g via INTRAVENOUS
  Filled 2013-03-15: qty 100

## 2013-03-15 MED ORDER — POTASSIUM CHLORIDE CRYS ER 20 MEQ PO TBCR
40.0000 meq | EXTENDED_RELEASE_TABLET | Freq: Once | ORAL | Status: AC
  Administered 2013-03-15: 40 meq via ORAL
  Filled 2013-03-15: qty 2

## 2013-03-15 NOTE — Progress Notes (Signed)
Halvor Behrend, PTA [phone] 03/15/2013  

## 2013-03-15 NOTE — Clinical Documentation Improvement (Signed)
  THIS DOCUMENT IS NOT A PERMANENT PART OF THE MEDICAL RECORD.   Please update your documentation within the medical record to reflect your response to this query.                                                                                        03/15/13   Dear Dr.Anielle Headrick / Associates,  In a better effort to capture your patient's severity of illness, reflect appropriate length of stay and utilization of resources, a review of the patient medical record has revealed the following indicators.   Acute on chronic diastolic heart failure Chronic renal failure noted in H&P Hypertension noted in H&P    1. Please clarify if patient has:   - any linkage between CRF, hypertension and her acute on chronic diastolic heart failure                                        - no relationship between hypertension and CRF to her acute on chronic diastolic heart failure              - other   2. Please clarify the stage of CRF if known. GFR range per CHL are 30's to 50's since 2012 (white female)   Please document your findings in progress note and discharge summary.   In responding to this query please exercise your independent judgment.  The fact that a query is asked, does not imply that any particular answer is desired or expected.  You may use possible, probable, or suspect with inpatient documentation. possible, probable, suspected diagnoses MUST be documented at the time of discharge  Reviewed: additional documentation in the medical record  Thank You,  Shellee Milo  Clinical Documentation Specialist:  cell: [phone]  Health Information Management [place]  TO RESPOND TO THE THIS QUERY, FOLLOW THE INSTRUCTIONS BELOW:  1. If needed, update documentation for the patient's encounter via the notes activity.  2. Access this query again and click edit on the In [employer].  3. After updating, or not, click F2 to complete all highlighted (required) fields concerning your  review. Select "additional documentation in the medical record" OR "no additional documentation provided".  4. Click Sign note button.  5. The deficiency will fall out of your In Basket *Please let us know if you are not able to complete this workflow by phone or e-mail (listed below).

## 2013-03-15 NOTE — Progress Notes (Signed)
Physical Therapy Treatment Patient Details Name: [name] MRN: [id number] DOB: July 08, 1933 Today's Date: 03/15/2013 Time: 0143-0200 PT Time Calculation (min): 17 min  PT Assessment / Plan / Recommendation Comments on Treatment Session  Pt. was able to ambulate further this treatment session and is making progress toward her ambulation goal.     Follow Up Recommendations  Home health PT;Supervision/Assistance - 24 hour     Does the patient have the potential to tolerate intense rehabilitation     Barriers to Discharge        Equipment Recommendations  Rolling walker with 5" wheels    Recommendations for Other Services    Frequency Min 3X/week   Plan Discharge plan remains appropriate    Precautions / Restrictions Precautions Precautions: Fall Restrictions Weight Bearing Restrictions: No   Pertinent Vitals/Pain Pt. Stated she had right knee discomfort that had been persistent over the last 3 months.    Mobility  Bed Mobility Bed Mobility: Sit to Supine Sit to Supine: 4: Min guard;With rail;HOB flat Details for Bed Mobility Assistance: Pt. required VC to raise her hips and scoot up in order to get toward the head of the bed. Transfers Transfers: Sit to Stand;Stand to Sit Sit to Stand: 4: Min assist;With upper extremity assist;From chair/3-in-1 Stand to Sit: 4: Min guard;With upper extremity assist;With armrests;To bed;To chair/3-in-1 Details for Transfer Assistance: Pt. required minA for standing balance upon initially standing. VC were given to pause for several seconds in order to gain balance when standing. Ambulation/Gait Ambulation/Gait Assistance: 4: Min guard Ambulation Distance (Feet): 120 Feet Assistive device: Rolling walker Ambulation/Gait Assistance Details: Pt. needed VC for correct posture while ambulating. Pt. c/o of Right knee discomfort and gerneralized weakness. Gait Pattern: Step-through pattern;Decreased stride length;Trunk flexed Gait velocity:  decreased    Exercises General Exercises - Lower Extremity Long Arc Quad: 10 reps;Both;Seated Hip Flexion/Marching: 10 reps;Both;Seated     PT Goals Acute Rehab PT Goals Time For Goal Achievement: 03/28/13 Potential to Achieve Goals: Good Pt will go Supine/Side to Sit: with modified independence;with HOB 0 degrees Pt will go Sit to Stand: with supervision;with upper extremity assist PT Goal: Sit to Stand - Progress: Progressing toward goal Pt will Transfer Bed to Chair/Chair to Bed: with supervision Pt will Ambulate: 51 - 150 feet;with supervision;with least restrictive assistive device PT Goal: Ambulate - Progress: Progressing toward goal Pt will Perform Home Exercise Program: with supervision, verbal cues required/provided  Visit Information  Last PT Received On: 03/15/13    Subjective Data  Subjective: Pt. was recieved sitting in chair agreeable to PT. Pt. reported discomfort in her right knee.    Cognition  Cognition Arousal/Alertness: Awake/alert Behavior During Therapy: WFL for tasks assessed/performed Overall Cognitive Status: Within Functional Limits for tasks assessed    Balance     End of Session PT - End of Session Equipment Utilized During Treatment: Gait belt Activity Tolerance: Patient limited by fatigue Patient left: in bed;with call bell/phone within reach   GP     Jolyn Nap, SPTA 03/15/2013, 2:24 PM

## 2013-03-15 NOTE — Progress Notes (Signed)
Subjective:  Feeling better. Less SOB.   Objective:  Vital Signs in the last 24 hours: Temp:  [98 F (36.7 C)-98.4 F (36.9 C)] 98.2 F (36.8 C) (06/19 0617) Pulse Rate:  [51-72] 51 (06/19 0617) Resp:  [18] 18 (06/19 0617) BP: (96-122)/(56-70) 105/61 mmHg (06/19 0617) SpO2:  [94 %-95 %] 95 % (06/19 0617) Weight:  [98.93 kg (218 lb 1.6 oz)] 98.93 kg (218 lb 1.6 oz) (06/19 0617)  Intake/Output from previous day: 06/18 0701 - 06/19 0700 In: 840 [P.O.:840] Out: 6500 [Urine:6500]   Physical Exam: General: Well developed, well nourished, in no acute distress  Head: Eyes PERRLA, No xanthomas. Normal cephalic and atramatic  Lungs: Clear bilaterally to auscultation and percussion. Normal respiratory effort. No wheezes, no rales.  Heart: Irregularly irregular Pulses are 2+ & equal. No murmur, no significant JVD No carotid bruit. No abdominal bruits.  Abdomen: Bowel sounds are positive, abdomen soft and non-tender without masses. No hepatosplenomegaly. Obese  Msk: Back normal. Normal strength and tone for age.  Extremities: No clubbing, cyanosis. 1-2+ lower extremity edema. DP +1  Neuro: Alert and oriented X 3, non-focal, MAE x 4  GU: Deferred  Rectal: Deferred  Psych: Good affect, responds appropriately     Lab Results:  Recent Labs  03/13/13 1743 03/14/13 0427  WBC 5.8 6.4  HGB 11.8* 11.2*  PLT 120* 106*    Recent Labs  03/14/13 0427 03/15/13 0635  NA 137 139  K 3.2* 3.7  CL 97 96  CO2 31 35*  GLUCOSE 142* 118*  BUN 25* 30*  CREATININE 1.33* 1.29*    Recent Labs  03/13/13 1345  TROPONINI <0.30   Hepatic Function Panel  Recent Labs  03/13/13 1345  PROT 7.3  ALBUMIN 3.5  AST 23  ALT 11  ALKPHOS 104  BILITOT 1.0   Imaging: Dg Chest 2 View  03/13/2013   *RADIOLOGY REPORT*  Clinical Data: Congestive heart failure  CHEST - 2 VIEW  Comparison: Feb 09, 2013  Findings:  There is cardiomegaly with mild pulmonary venous hypertension and mild interstitial  edema. There is no appreciable effusion or airspace consolidation.  No adenopathy.  The patient is status post aortic valve replacement.  There is calcification in the mitral annulus.  IMPRESSION: Evidence of a degree of congestive heart failure.  No airspace consolidation.   Original Report Authenticated By: Bretta Bang, M.D.   Personally viewed.   Telemetry: No adverse rhythms Personally viewed.   Assessment/Plan:  Active Problems:   DIABETIC PERIPHERAL NEUROPATHY   HYPERLIPIDEMIA   OBESITY, MORBID   ANEMIA OF RENAL FAILURE   SLEEP APNEA, OBSTRUCTIVE, MILD   ALZHEIMERS DISEASE   HYPERTENSION, CONTROLLED   PAROXYSMAL ATRIAL FIBRILLATION   ATHEROSCLEROTIC CARDIOVASCULAR DISEASE   Muscle weakness (generalized)   AORTIC VALVE REPLACEMENT, HX OF   CHF (congestive heart failure)   Weakness generalized    79 year old with acute on chronic diastolic heart failure, obesity, coronary artery disease status post bypass, bioprosthetic aortic valve replacement, chronic left bundle branch block, atrial fibrillation.   1. Acute on chronic diastolic heart failure-EF approximately 45-50%.Sodium maintaining. Once again discussed not drinking too much cranberry juice at home.  Let us continue today with IV furosemide 80 mg IV twice a day. Repleting potassium. Tomorrow will likely be day to change to oral. Watch BMET.  2. Coronary artery disease appears stable  3. Aortic valve bioprosthetic is stable.   4. Atrial fibrillation - She warrants anticoagulation given her elevated risk. She is at  elevated risk for stroke. Started Xarelto 20mg  PO QD. Discussed with her bleeding risks. In the past, following bypass, she was on Coumadin transiently. She does not require anticoagulation for her bioprosthetic aortic valve nor do I believe that her bioprosthetic aortic valve is contributing to her atrial fibrillation. I do not think it is unreasonable to utilize Xarelto in this setting especially given the  possible compliance issues with Coumadin.   5. Transient bradycardia-asymptomatic, no syncope. With left bundle branch block. She has seen Dr. Johney Frame in the past. Did not want to have pacemaker placed at that time. We will continue with low-dose amiodarone. She has also had issues with atrial tachycardias in the past.   Possible DC tomorrrow.   SKAINS, MARK 03/15/2013, 8:53 AM

## 2013-03-15 NOTE — Progress Notes (Addendum)
[facility]                                                                                Patient Demographics  [name], is a 79 y.o. female, DOB - [birth year], ZOX:[id number], WJX:[id number]  Admit date - 03/13/2013  Admitting Physician Leroy Sea, MD  Outpatient Primary MD for the patient is [name], MD  LOS - 2   Chief Complaint  Patient presents with  . Congestive Heart Failure  . Edema        Assessment & Plan    1. Acute on chronic CHF diastolic along with mildly reduced EF per last echogram done early this year with EF of around 45-50%.- she is much improved after IV Lasix drip and Zaroxolyn, she was seen by cardiologist Dr. Chales Abrahams today, continue diuresis, she feels much better,continue beta blocker and nitro paste. Recent echo noted from early this year.   Echo 01/2013  - Left ventricle: The cavity size was normal. Wall thickness was normal. Systolic function was mildly reduced. The estimated ejection fraction was in the range of 45% to 50%. - Aortic valve: A prosthesis was present and functioning normally. The prosthesis had a normal range of motion. The sewing ring appeared normal, had no rocking motion, and showed no evidence of dehiscence. - Mitral valve: Moderately calcified annulus. Mildly thickened leaflets . No evidence of vegetation. Mild regurgitation. - Left atrium: No evidence of thrombus in the atrial cavity or appendage. No evidence of thrombus in the appendage. - Right atrium: No evidence of thrombus in the atrial cavity or appendage. - Atrial septum: No defect or patent foramen ovale was identified. - Tricuspid valve: No evidence of vegetation. No evidence of vegetation. - Pulmonic valve: No evidence of vegetation. - Pulmonary arteries: Systolic pressure was moderately increased. - Superior vena cava: The study excluded a thrombus.  Impressions:  - No evidence of endocarditis. Vegetation is absent.    2. History of  paroxysmal atrial fibrillation and aortic valve replacement. No acute issues, continue her on amiodarone along with her beta blocker, aspirin  xaralto has been added today by cardiology.    3. Diabetes mellitus type 2.  home dose Lantus will be continued, will add sliding scale insulin q. a.c. at bedtime.    Lab Results  Component Value Date   HGBA1C 6.9* 03/13/2013    CBG (last 3)   Recent Labs  03/14/13 2057 03/15/13 0620 03/15/13 1132  GLUCAP 110* 116* 220*      4. Recent bacteremia. She has finished her total 4 weeks of antibiotics prior to admission. No acute issues.     5. Mild dementia. Stable, mild confusion and delirium at this time and expected to continue during hospitalization, avoid benzodiazepine exposure, continue Aricept.     6. Hypertension on the dyslipidemia. Stable home medications which include beta blocker, and statin will be continued.    7.CKD 3 - at baseline.     Code Status: partial  Family Communication: daughter  Disposition Plan: home   Procedures     Consults  [facility]   DVT Prophylaxis  xaralto  Lab Results  Component Value Date   PLT 106* 03/14/2013  Medications  Scheduled Meds: . amiodarone  100 mg Oral BID  . aspirin EC  81 mg Oral Daily  . docusate sodium  100 mg Oral BID  . donepezil  10 mg Oral QHS  . ferrous sulfate  325 mg Oral BID  . furosemide  80 mg Intravenous BID  . gabapentin  300 mg Oral Daily  . gabapentin  600 mg Oral QHS  . insulin aspart  0-5 Units Subcutaneous QHS  . insulin aspart  0-9 Units Subcutaneous TID WC  . insulin glargine  24 Units Subcutaneous QPM  . metoprolol  50 mg Oral BID  . omega-3 acid ethyl esters  1 g Oral Daily  . PARoxetine  40 mg Oral BH-q7a  . potassium chloride  40 mEq Oral Daily  . rivaroxaban  15 mg Oral Q supper  . simvastatin  10 mg Oral QHS  . sodium chloride  3 mL Intravenous Q12H  . spironolactone  12.5 mg Oral BID   Continuous Infusions:  PRN  Meds:.albuterol, alum & mag hydroxide-simeth, guaiFENesin-dextromethorphan, HYDROcodone-acetaminophen, ondansetron (ZOFRAN) IV, ondansetron, polyethylene glycol  Antibiotics    Anti-infectives   None       Time Spent in minutes   35   Susa Raring K M.D on 03/15/2013 at 1:07 PM  Between 7am to 7pm - Pager - [phone]  After 7pm go to [email or web address] - password [email or web address]  And look for the night coverage person covering for me after hours  [facility] Office  [phone]    Subjective:   [facility] today has, No headache, No chest pain, No abdominal pain - No Nausea, No new weakness tingling or numbness, No Cough - SOB.   Objective:   Filed Vitals:   03/14/13 1500 03/14/13 2059 03/15/13 0617 03/15/13 1052  BP: 96/56 122/70 105/61 109/52  Pulse: 72 63 51 56  Temp: 98 F (36.7 C) 98.4 F (36.9 C) 98.2 F (36.8 C)   TempSrc: Oral Oral Oral   Resp: 18 18 18    Height:      Weight:   98.93 kg (218 lb 1.6 oz)   SpO2: 95% 94% 95%     Wt Readings from Last 3 Encounters:  03/15/13 98.93 kg (218 lb 1.6 oz)  02/09/13 97.16 kg (214 lb 3.2 oz)  02/09/13 97.16 kg (214 lb 3.2 oz)     Intake/Output Summary (Last 24 hours) at 03/15/13 1307 Last data filed at 03/15/13 1125  Gross per 24 hour  Intake    603 ml  Output   5750 ml  Net  -5147 ml    Exam Awake Alert, Oriented X 3, No new F.N deficits, Normal affect [place].AT,PERRAL Supple Neck,No JVD, No cervical lymphadenopathy appriciated.  Symmetrical Chest wall movement, Good air movement bilaterally, CTAB RRR,No Gallops,Rubs or new Murmurs, No Parasternal Heave +ve B.Sounds, Abd Soft, Non tender, No organomegaly appriciated, No rebound - guarding or rigidity. No Cyanosis, Clubbing , 2+edema, No new Rash or bruise     Data Review   Micro Results Recent Results (from the past 240 hour(s))  URINE CULTURE     Status: None   Collection Time    03/13/13  2:47 PM      Result Value Range Status   Specimen  Description URINE, CLEAN CATCH   Final   Special Requests NONE   Final   Culture  Setup Time 03/13/2013 15:23   Final   Colony Count NO GROWTH   Final   Culture NO GROWTH  Final   Report Status 03/14/2013 FINAL   Final  MRSA PCR SCREENING     Status: None   Collection Time    03/13/13  5:11 PM      Result Value Range Status   MRSA by PCR NEGATIVE  NEGATIVE Final   Comment:            The GeneXpert MRSA Assay (FDA     approved for NASAL specimens     only), is one component of a     comprehensive MRSA colonization     surveillance program. It is not     intended to diagnose MRSA     infection nor to guide or     monitor treatment for     MRSA infections.    Radiology Reports Dg Chest 2 View  03/13/2013   *RADIOLOGY REPORT*  Clinical Data: Congestive heart failure  CHEST - 2 VIEW  Comparison: Feb 09, 2013  Findings:  There is cardiomegaly with mild pulmonary venous hypertension and mild interstitial edema. There is no appreciable effusion or airspace consolidation.  No adenopathy.  The patient is status post aortic valve replacement.  There is calcification in the mitral annulus.  IMPRESSION: Evidence of a degree of congestive heart failure.  No airspace consolidation.   Original Report Authenticated By: Bretta Bang, M.D.    CBC  Recent Labs Lab 03/13/13 1345 03/13/13 1743 03/14/13 0427  WBC 6.7 5.8 6.4  HGB 12.3 11.8* 11.2*  HCT 36.8 34.9* 33.1*  PLT 129* 120* 106*  MCV 93.2 93.8 94.0  MCH 31.1 31.7 31.8  MCHC 33.4 33.8 33.8  RDW 15.2 15.1 15.2  LYMPHSABS 1.3  --   --   MONOABS 0.8  --   --   EOSABS 0.2  --   --   BASOSABS 0.1  --   --     Chemistries   Recent Labs Lab 03/13/13 1345 03/13/13 1743 03/14/13 0427 03/15/13 0635  NA 133*  --  137 139  K 4.5  --  3.2* 3.7  CL 96  --  97 96  CO2 24  --  31 35*  GLUCOSE 182*  --  142* 118*  BUN 26*  --  25* 30*  CREATININE 1.18* 1.15* 1.33* 1.29*  CALCIUM 8.9  --  8.9 9.4  MG  --   --   --  1.9  AST 23   --   --   --   ALT 11  --   --   --   ALKPHOS 104  --   --   --   BILITOT 1.0  --   --   --    ------------------------------------------------------------------------------------------------------------------ estimated creatinine clearance is 40.9 ml/min (by C-G formula based on Cr of 1.29). ------------------------------------------------------------------------------------------------------------------  Recent Labs  03/13/13 1743  HGBA1C 6.9*   ------------------------------------------------------------------------------------------------------------------ No results found for this basename: CHOL, HDL, LDLCALC, TRIG, CHOLHDL, LDLDIRECT,  in the last 72 hours ------------------------------------------------------------------------------------------------------------------  Recent Labs  03/13/13 1743  TSH 3.515   ------------------------------------------------------------------------------------------------------------------ No results found for this basename: VITAMINB12, FOLATE, FERRITIN, TIBC, IRON, RETICCTPCT,  in the last 72 hours  Coagulation profile  Recent Labs Lab 03/13/13 1743  INR 1.20    No results found for this basename: DDIMER,  in the last 72 hours  Cardiac Enzymes  Recent Labs Lab 03/13/13 1345  TROPONINI <0.30   ------------------------------------------------------------------------------------------------------------------ No components found with this basename: POCBNP,

## 2013-03-16 LAB — GLUCOSE, CAPILLARY: Glucose-Capillary: 180 mg/dL — ABNORMAL HIGH (ref 70–99)

## 2013-03-16 MED ORDER — FUROSEMIDE 20 MG PO TABS: 40.0000 mg | ORAL_TABLET | Freq: Two times a day (BID) | ORAL | Status: DC

## 2013-03-16 MED ORDER — RIVAROXABAN 15 MG PO TABS: 15.0000 mg | ORAL_TABLET | Freq: Every day | ORAL | Status: DC

## 2013-03-16 MED ORDER — METOPROLOL TARTRATE 25 MG PO TABS
25.0000 mg | ORAL_TABLET | Freq: Two times a day (BID) | ORAL | Status: DC
  Administered 2013-03-16: 25 mg via ORAL
  Filled 2013-03-16 (×2): qty 1

## 2013-03-16 MED ORDER — POTASSIUM CHLORIDE ER 20 MEQ PO TBCR: 40.0000 meq | EXTENDED_RELEASE_TABLET | Freq: Two times a day (BID) | ORAL | Status: DC

## 2013-03-16 MED ORDER — POTASSIUM CHLORIDE CRYS ER 20 MEQ PO TBCR
40.0000 meq | EXTENDED_RELEASE_TABLET | ORAL | Status: AC
  Administered 2013-03-16 (×2): 40 meq via ORAL
  Filled 2013-03-16 (×2): qty 2

## 2013-03-16 NOTE — Progress Notes (Signed)
The patient had a 2.06 second pause this morning.  The MD was text paged x1 and the oncoming nurse was notified.

## 2013-03-16 NOTE — Progress Notes (Signed)
[name], PTA [phone] 03/16/2013  

## 2013-03-16 NOTE — Progress Notes (Signed)
Physical Therapy Treatment Patient Details Name: [name] MRN: [id number] DOB: Jan 23, 1933 Today's Date: 03/16/2013 Time: 4098-1191 PT Time Calculation (min): 29 min  PT Assessment / Plan / Recommendation Comments on Treatment Session  Pt. able to increase ambulation distance during today's treatement and continues to make progress toward her ambulation goal. She needs VC to extend trunk when standing and pause briefly before taking a step. Pt. Is agreeable to PT, but continues to be limited by fatigue.     Follow Up Recommendations  Home health PT;Supervision/Assistance - 24 hour     Does the patient have the potential to tolerate intense rehabilitation     Barriers to Discharge        Equipment Recommendations  Rolling walker with 5" wheels    Recommendations for Other Services    Frequency Min 3X/week   Plan Discharge plan remains appropriate    Precautions / Restrictions Precautions Precautions: Fall Restrictions Weight Bearing Restrictions: No   Pertinent Vitals/Pain Patient made reports of pain during today's treatment.     Mobility  Transfers Transfers: Sit to Stand;Stand to Sit Sit to Stand: 4: Min assist;With upper extremity assist;From chair/3-in-1 Stand to Sit: 4: Min guard;With upper extremity assist;With armrests;To bed;To chair/3-in-1 Details for Transfer Assistance: Pt. required only minor minA today to achieve standing. Pt. continures to need VC for trunk extension when initially standing. Pt able to perform stand-pivot from recliner<>3-in-1 with [employer].  Pt. Performed sit to stand x 5.  Ambulation/Gait Ambulation/Gait Assistance: 4: Min guard Ambulation Distance (Feet): 140 Feet Assistive device: Rolling walker Ambulation/Gait Assistance Details: Pt. required VC to maintain walker close to body while walking and to slow gait for safety and balance.  Gait Pattern: Step-through pattern;Decreased stride length;Trunk flexed Gait velocity: decreased     Exercises General Exercises - Lower Extremity Long Arc Quad: AROM;Both;10 reps;Seated Toe Raises: AROM;Both;10 reps;Seated Heel Raises: AROM;Both;10 reps;Seated     PT Goals Acute Rehab PT Goals Time For Goal Achievement: 03/28/13 Potential to Achieve Goals: Good Pt will go Supine/Side to Sit: with modified independence;with HOB 0 degrees Pt will go Sit to Stand: with supervision;with upper extremity assist PT Goal: Sit to Stand - Progress: Progressing toward goal Pt will Transfer Bed to Chair/Chair to Bed: with supervision Pt will Ambulate: 51 - 150 feet;with supervision;with least restrictive assistive device PT Goal: Ambulate - Progress: Progressing toward goal Pt will Perform Home Exercise Program: with supervision, verbal cues required/provided  Visit Information  Last PT Received On: 03/16/13 Assistance Needed: +1    Subjective Data  Subjective: Pt. c/o of gerneral soreness in bil LE. Pt. Expressed that she was eager to get home.    Cognition  Cognition Arousal/Alertness: Awake/alert Behavior During Therapy: WFL for tasks assessed/performed Overall Cognitive Status: Within Functional Limits for tasks assessed    Balance     End of Session PT - End of Session Equipment Utilized During Treatment: Gait belt Activity Tolerance: Patient limited by fatigue Patient left: in chair;with call bell/phone within reach   GP     Jolyn Nap, SPTA 03/16/2013, 11:03 AM

## 2013-03-16 NOTE — Discharge Summary (Signed)
[facility]                                                                                   [name], is a 79 y.o. female  DOB April 14, 1933  MRN [id number].  Admission date:  03/13/2013  Discharge Date:  03/16/2013  Primary MD  [name], MD  Admitting Physician  Leroy Sea, MD  Admission Diagnosis  Anemia in chronic kidney disease(285.21) [285.21] Alzheimer's disease [331.0] Atrial fibrillation [427.31] CHF, acute on chronic [428.0]  Discharge Diagnosis     Active Problems:   DIABETIC PERIPHERAL NEUROPATHY   HYPERLIPIDEMIA   OBESITY, MORBID   ANEMIA OF RENAL FAILURE   SLEEP APNEA, OBSTRUCTIVE, MILD   ALZHEIMERS DISEASE   HYPERTENSION, CONTROLLED   PAROXYSMAL ATRIAL FIBRILLATION   ATHEROSCLEROTIC CARDIOVASCULAR DISEASE   Muscle weakness (generalized)   AORTIC VALVE REPLACEMENT, HX OF   CHF (congestive heart failure)   Weakness generalized    Past Medical History  Diagnosis Date  . Coronary artery disease 09/04/08    single vessel CABG at time of AVR   . Aortic stenosis 09/04/08    s/p AVR by Dr Tyrone Sage (tissue valve)  . Paroxysmal atrial fibrillation   . LBBB (left bundle branch block)   . CHF (congestive heart failure)     diastolic  . Sleep apnea   . Alzheimer's dementia   . Diabetes mellitus     type 2  . Hypertension   . Chronic anemia   . Osteoporosis   . Chronic renal failure   . Morbid obesity with BMI of 50.0-59.9, adult 03/09/2012  . Peripheral vascular disease   . Arthritis     Past Surgical History  Procedure Laterality Date  . Coronary artery bypass graft  09/04/08    single vessel at time of AVR  . Aortic valve replacement  12/09    tissue valve by Dr Tyrone Sage  . Cholecystectomy  1995  . Cataract extraction    . Replacement total knee bilateral      L 2000, R 1997  . Appendectomy    . Closed reduction femoral shaft fracture  2007    R intramedullary rod  . Tee without cardioversion N/A 02/07/2013    Procedure:  TRANSESOPHAGEAL ECHOCARDIOGRAM (TEE);  Surgeon: Donato Schultz, MD;  Location: [facility] ENDOSCOPY;  Service: Cardiovascular;  Laterality: N/A;     Recommendations for primary care physician for things to follow:   Follow weight, BMP and diuretic dose closely.   Discharge Diagnoses:   Active Problems:   DIABETIC PERIPHERAL NEUROPATHY   HYPERLIPIDEMIA   OBESITY, MORBID   ANEMIA OF RENAL FAILURE   SLEEP APNEA, OBSTRUCTIVE, MILD   ALZHEIMERS DISEASE   HYPERTENSION, CONTROLLED   PAROXYSMAL ATRIAL FIBRILLATION   ATHEROSCLEROTIC CARDIOVASCULAR DISEASE   Muscle weakness (generalized)   AORTIC VALVE REPLACEMENT, HX OF   CHF (congestive heart failure)   Weakness generalized    Discharge Condition: Stable   Diet recommendation: See Discharge Instructions below   Consults cardiology- Dr Anne Fu    History of present illness and  Hospital Course:     Kindly see H&P for history of present illness and admission details, please  review complete Labs, Consult reports and Test reports for all details in brief [name], is a 79 y.o. female, patient with history of combined systolic and diastolic heart failure with EF 45%, type 2 diabetes mellitus under diet control, morbid obesity, paroxysmal atrial fibrillation, mild dementia, hypertension and dyslipidemia was admitted to the hospital with gradually progressive edema and weight gain due to acute on chronic combined systolic and diastolic heart failure, she was kept on IV Lasix along with Zaroxolyn combination with good effect, she is diuresed close to 15 L and is now edema free. She was seen here by Dr. Dorothyann Peng with whom I discussed her case today in detail, patient is clear for discharge on below 80 medications, she will need close followup on her weight BMP and diuretic dose. Of note patient had asymptomatic 2 second pause, at this time Dr. Chales Abrahams does not recommend any medication changes. Patient will closely follow with him post  discharge.     Echo 01/2013  - Left ventricle: The cavity size was normal. Wall thickness was normal. Systolic function was mildly reduced. The estimated ejection fraction was in the range of 45% to 50%. - Aortic valve: A prosthesis was present and functioning normally. The prosthesis had a normal range of motion. The sewing ring appeared normal, had no rocking motion, and showed no evidence of dehiscence. - Mitral valve: Moderately calcified annulus. Mildly thickened leaflets . No evidence of vegetation. Mild regurgitation. - Left atrium: No evidence of thrombus in the atrial cavity or appendage. No evidence of thrombus in the appendage. - Right atrium: No evidence of thrombus in the atrial cavity or appendage. - Atrial septum: No defect or patent foramen ovale was identified. - Tricuspid valve: No evidence of vegetation. No evidence of vegetation. - Pulmonic valve: No evidence of vegetation. - Pulmonary arteries: Systolic pressure was moderately increased. - Superior vena cava: The study excluded a thrombus.   Impressions:  - No evidence of endocarditis. Vegetation is absent.     For her history of paroxysmal atrial fibrillation she will continue her home dose of amiodarone along with aspirin and beta blocker, she has been placed on xaralto by cardiology which she will continue to do post discharge.   For diabetes mellitus type 2 she will continue diet control, for her hypertension and dyslipidemia she will continue her home medications unchanged.   She has chronic kidney disease stage III she was in mild acute renal failure upon admission this has resolved and she is back to her baseline post diuresis.     Today   Subjective:   [name] today has no headache,no chest abdominal pain,no new weakness tingling or numbness, feels much better wants to go home today.    Objective:   Blood pressure 112/64, pulse 57, temperature 98 F (36.7 C), temperature source Oral,  resp. rate 20, height 5\' 3"  (1.6 m), weight 97.115 kg (214 lb 1.6 oz), SpO2 92.00%.   Intake/Output Summary (Last 24 hours) at 03/16/13 0948 Last data filed at 03/16/13 0754  Gross per 24 hour  Intake   1386 ml  Output   3400 ml  Net  -2014 ml    Exam Awake Alert, Oriented *3, No new F.N deficits, Normal affect [place].AT,PERRAL Supple Neck,No JVD, No cervical lymphadenopathy appriciated.  Symmetrical Chest wall movement, Good air movement bilaterally, CTAB RRR,No Gallops,Rubs or new Murmurs, No Parasternal Heave +ve B.Sounds, Abd Soft, Non tender, No organomegaly appriciated, No rebound -guarding or rigidity. No Cyanosis, Clubbing  or edema, No new Rash or bruise  Data Review   Major procedures and Radiology Reports - PLEASE review detailed and final reports for all details in brief -       Dg Chest 2 View  03/13/2013   *RADIOLOGY REPORT*  Clinical Data: Congestive heart failure  CHEST - 2 VIEW  Comparison: Feb 09, 2013  Findings:  There is cardiomegaly with mild pulmonary venous hypertension and mild interstitial edema. There is no appreciable effusion or airspace consolidation.  No adenopathy.  The patient is status post aortic valve replacement.  There is calcification in the mitral annulus.  IMPRESSION: Evidence of a degree of congestive heart failure.  No airspace consolidation.   Original Report Authenticated By: Bretta Bang, M.D.    Micro Results      Recent Results (from the past 240 hour(s))  URINE CULTURE     Status: None   Collection Time    03/13/13  2:47 PM      Result Value Range Status   Specimen Description URINE, CLEAN CATCH   Final   Special Requests NONE   Final   Culture  Setup Time 03/13/2013 15:23   Final   Colony Count NO GROWTH   Final   Culture NO GROWTH   Final   Report Status 03/14/2013 FINAL   Final  MRSA PCR SCREENING     Status: None   Collection Time    03/13/13  5:11 PM      Result Value Range Status   MRSA by PCR NEGATIVE  NEGATIVE  Final   Comment:            The GeneXpert MRSA Assay (FDA     approved for NASAL specimens     only), is one component of a     comprehensive MRSA colonization     surveillance program. It is not     intended to diagnose MRSA     infection nor to guide or     monitor treatment for     MRSA infections.     CBC w Diff: Lab Results  Component Value Date   WBC 6.4 03/14/2013   WBC 6.9 12/25/2012   WBC 5.6 10/12/2011   HGB 11.2* 03/14/2013   HGB 13.8 12/25/2012   HGB 12.3 10/12/2011   HCT 33.1* 03/14/2013   HCT 39.4 12/25/2012   HCT 35.9 10/12/2011   PLT 106* 03/14/2013   PLT 139* 12/25/2012   PLT 122* 10/12/2011   LYMPHOPCT 19 03/13/2013   LYMPHOPCT 19.3 12/25/2012   LYMPHOPCT 17.5 10/12/2011   MONOPCT 12 03/13/2013   MONOPCT 12.0 12/25/2012   MONOPCT 12.0 10/12/2011   EOSPCT 3 03/13/2013   EOSPCT 4.4 12/25/2012   EOSPCT 1.4 10/12/2011   BASOPCT 1 03/13/2013   BASOPCT 0.6 12/25/2012   BASOPCT 0.6 10/12/2011    CMP: Lab Results  Component Value Date   NA 139 03/15/2013   K 3.4* 03/16/2013   CL 96 03/15/2013   CO2 35* 03/15/2013   BUN 30* 03/15/2013   CREATININE 1.29* 03/15/2013   PROT 7.3 03/13/2013   ALBUMIN 3.5 03/13/2013   BILITOT 1.0 03/13/2013   ALKPHOS 104 03/13/2013   AST 23 03/13/2013   ALT 11 03/13/2013  .   Discharge Instructions     Follow with Primary MD [name], MD in 4 days, follow with Dr. Serena Croissant in a week and get your Lasix, Aldactone, potassium and xaralto dose readjusted as needed.  Get CBC, CMP, checked 4  days by Primary MD and again as instructed by your Primary MD. Get a 2 view Chest X ray done next visit..  Get Medicines reviewed and adjusted.  Please request your Prim.MD to go over all Hospital Tests and Procedure/Radiological results at the follow up, please get all Hospital records sent to your Prim MD by signing hospital release before you go home.  Activity: As tolerated with Full fall precautions use walker/cane & assistance as needed   Diet:   Heart Healthy - low carbohydrate,  Fluid restriction 1.5 lit/day, Aspiration precautions.  Check your Weight same time everyday, if you gain over 2 pounds, or you develop in leg swelling, experience more shortness of breath or chest pain, call your Primary MD immediately. Follow Cardiac Low Salt Diet and 1.5 lit/day fluid restriction.  Disposition Home    If you experience worsening of your admission symptoms, develop shortness of breath, life threatening emergency, suicidal or homicidal thoughts you must seek medical attention immediately by calling 911 or calling your MD immediately  if symptoms less severe.  You Must read complete instructions/literature along with all the possible adverse reactions/side effects for all the Medicines you take and that have been prescribed to you. Take any new Medicines after you have completely understood and accpet all the possible adverse reactions/side effects.   Do not drive and provide baby sitting services if your were admitted for syncope or siezures until you have seen by Primary MD or a Neurologist and advised to do so again.  Do not drive when taking Pain medications.    Do not take more than prescribed Pain, Sleep and Anxiety Medications  Special Instructions: If you have smoked or chewed Tobacco  in the last 2 yrs please stop smoking, stop any regular Alcohol  and or any Recreational drug use.  Wear Seat belts while driving.   Please note  You were cared for by a hospitalist during your hospital stay. If you have any questions about your discharge medications or the care you received while you were in the hospital after you are discharged, you can call the unit and asked to speak with the hospitalist on call if the hospitalist that took care of you is not available. Once you are discharged, your primary care physician will handle any further medical issues. Please note that NO REFILLS for any discharge medications will be authorized once you  are discharged, as it is imperative that you return to your primary care physician (or establish a relationship with a primary care physician if you do not have one) for your aftercare needs so that they can reassess your need for medications and monitor your lab values.   Follow-up Information   Follow up with [facility]. [facility] Nurse)    Contact information:   [phone]      Follow up with [facility], MD. Schedule an appointment as soon as possible for a visit in 4 days.   Contact information:   [address] [place] [address] [place] [address] [phone]       Follow up with Donato Schultz, MD. Schedule an appointment as soon as possible for a visit in 1 week.   Contact information:   [address] [place] [address] [phone]         Discharge Medications     Medication List    STOP taking these medications       potassium chloride SA 20 MEQ tablet  Commonly known as:  K-DUR,KLOR-CON  rifampin 300 MG capsule  Commonly known as:  RIFADIN      TAKE these medications       acetaminophen 325 MG tablet  Commonly known as:  TYLENOL  Take 162.5 mg by mouth every 6 (six) hours as needed. For pain     amiodarone 100 MG tablet  Commonly known as:  PACERONE  Take 1 tablet (100 mg total) by mouth 2 (two) times daily.     aspirin EC 81 MG tablet  Take 1 tablet (81 mg total) by mouth daily.     diphenhydrAMINE-zinc acetate cream  Commonly known as:  BENADRYL  Apply 1 application topically 2 (two) times daily as needed. For itching     docusate sodium 100 MG capsule  Commonly known as:  COLACE  Take 1 capsule (100 mg total) by mouth 2 (two) times daily as needed. For constipation     donepezil 10 MG tablet  Commonly known as:  ARICEPT  Take 1 tablet (10 mg total) by mouth at bedtime.     ferrous sulfate 325 (65 FE) MG tablet  Take 1 tablet (325 mg total) by mouth 2 (two) times daily.     fish oil-omega-3 fatty acids 1000  MG capsule  Take 1 g by mouth daily.     furosemide 20 MG tablet  Commonly known as:  LASIX  Take 2 tablets (40 mg total) by mouth 2 (two) times daily.     gabapentin 600 MG tablet  Commonly known as:  NEURONTIN  Take 300-600 mg by mouth 2 (two) times daily. Take 1/2 tab in the morning and 1 tab at bedtime     insulin glargine 100 UNIT/ML injection  Commonly known as:  LANTUS  Inject 24 Units into the skin every evening.     metolazone 2.5 MG tablet  Commonly known as:  ZAROXOLYN  Take 2.5 mg by mouth once.     metoprolol 50 MG tablet  Commonly known as:  LOPRESSOR  Take 1 tablet (50 mg total) by mouth 2 (two) times daily.     multivitamin tablet  Take 1 tablet by mouth daily.     PARoxetine 40 MG tablet  Commonly known as:  PAXIL  Take 1 tablet (40 mg total) by mouth every morning.     polyethylene glycol packet  Commonly known as:  MIRALAX / GLYCOLAX  Take 17 g by mouth daily as needed. For constipation     Potassium Chloride ER 20 MEQ Tbcr  Take 40 mEq by mouth 2 (two) times daily.     PRESERVISION AREDS PO  Take 1 capsule by mouth daily.     Rivaroxaban 15 MG Tabs tablet  Commonly known as:  XARELTO  Take 1 tablet (15 mg total) by mouth daily with supper.     Senna 15 MG Tabs  Take 1 tablet by mouth daily as needed. For constipation     simvastatin 10 MG tablet  Commonly known as:  ZOCOR  Take 1 tablet (10 mg total) by mouth at bedtime.     spironolactone 12.5 mg Tabs  Commonly known as:  ALDACTONE  Take 0.5 tablets (12.5 mg total) by mouth 2 (two) times daily.           Total Time in preparing paper work, data evaluation and todays exam - 35 minutes  Leroy Sea M.D on 03/16/2013 at 9:48 AM  [facility] Office  [phone]

## 2013-03-16 NOTE — Progress Notes (Signed)
The patient slept well during the night and was very pleasant this morning.  The patient did have periods of asymptomatic bradycardia last night in which her HR dropped into the upper 30s briefly.

## 2013-06-12 ENCOUNTER — Other Ambulatory Visit (HOSPITAL_COMMUNITY): Payer: Self-pay | Admitting: Orthopaedic Surgery

## 2013-06-12 DIAGNOSIS — M25561 Pain in right knee: Secondary | ICD-10-CM

## 2013-06-19 ENCOUNTER — Encounter (HOSPITAL_COMMUNITY): Payer: Medicare Other

## 2013-07-03 ENCOUNTER — Encounter (HOSPITAL_COMMUNITY)
Admission: RE | Admit: 2013-07-03 | Discharge: 2013-07-03 | Disposition: A | Discharge status: Home / Self Care | Payer: Medicare Other | Source: Ambulatory Visit | Attending: Orthopaedic Surgery | Admitting: Orthopaedic Surgery

## 2013-07-03 ENCOUNTER — Encounter (HOSPITAL_COMMUNITY): Payer: Self-pay

## 2013-07-03 DIAGNOSIS — M25469 Effusion, unspecified knee: Secondary | ICD-10-CM | POA: Insufficient documentation

## 2013-07-03 DIAGNOSIS — Z96659 Presence of unspecified artificial knee joint: Secondary | ICD-10-CM | POA: Insufficient documentation

## 2013-07-03 DIAGNOSIS — M25561 Pain in right knee: Secondary | ICD-10-CM

## 2013-07-03 DIAGNOSIS — M25569 Pain in unspecified knee: Secondary | ICD-10-CM | POA: Insufficient documentation

## 2013-07-03 MED ORDER — TECHNETIUM TC 99M MEDRONATE IV KIT
26.1000 | PACK | Freq: Once | INTRAVENOUS | Status: AC | PRN
  Administered 2013-07-03: 26.1 via INTRAVENOUS

## 2013-07-19 ENCOUNTER — Encounter: Payer: Self-pay | Admitting: Cardiology

## 2013-07-19 ENCOUNTER — Ambulatory Visit (INDEPENDENT_AMBULATORY_CARE_PROVIDER_SITE_OTHER): Payer: Medicare Other | Admitting: Cardiology

## 2013-07-19 VITALS — BP 152/51 | HR 76 | Ht 63.0 in | Wt 228.0 lb

## 2013-07-19 DIAGNOSIS — I480 Paroxysmal atrial fibrillation: Secondary | ICD-10-CM

## 2013-07-19 DIAGNOSIS — I4891 Unspecified atrial fibrillation: Secondary | ICD-10-CM

## 2013-07-19 DIAGNOSIS — I5032 Chronic diastolic (congestive) heart failure: Secondary | ICD-10-CM

## 2013-07-19 DIAGNOSIS — I10 Essential (primary) hypertension: Secondary | ICD-10-CM

## 2013-07-19 DIAGNOSIS — I509 Heart failure, unspecified: Secondary | ICD-10-CM

## 2013-07-19 LAB — BASIC METABOLIC PANEL
CO2: 30 mEq/L (ref 19–32)
Chloride: 99 mEq/L (ref 96–112)
Sodium: 137 mEq/L (ref 135–145)

## 2013-07-19 NOTE — Patient Instructions (Signed)
Your physician recommends that you continue on your current medications as directed. Please refer to the Current Medication list given to you today.  Labs today: Bmet  Your physician recommends that you schedule a follow-up appointment in: 1 month with Dr.Skains

## 2013-07-19 NOTE — Progress Notes (Signed)
[address] [place], [place]  [address] Phone: [phone] Fax:  [phone]  Date:  07/19/2013   ID:  [name], DOB [birth year], MRN [id number]  PCP:  [name], MD   History of Present Illness: [name] is a 79 y.o. female with chronic diastolic heart failure with multiple recurrent hospitalizations, normal EF, bypass with aortic valve replacement in 2009, morbid obesity, type 2 diabetes and bradycardia with pacemaker as well as atrial fibrillation here for followup.  06/15/13-she was taking too metolazone since previous visit. Her weight was up. She was worried about her dog with tooth abscess. [name], her daughter states that she constantly preaches to her about her fluid intake and salt intake. No knee replacement do to poor surgical candidate.  07/19/13 - Ortho with injection in knee. No SHOB. No CP. Knee a little improved. She takes metolazone extra approximately once per week. She still admits to dietary indiscretions. Her weight has been stable over the last month. No shortness of breath, no edema in lower extremities. She is weak. No chest pain.   Wt Readings from Last 3 Encounters:  07/19/13 228 lb (103.42 kg)  03/16/13 214 lb 1.6 oz (97.115 kg)  02/09/13 214 lb 3.2 oz (97.16 kg)     Past Medical History  Diagnosis Date  . Coronary artery disease 09/04/08    single vessel CABG at time of AVR   . Aortic stenosis 09/04/08    s/p AVR by Dr Tyrone Sage (tissue valve)  . Paroxysmal atrial fibrillation   . LBBB (left bundle branch block)   . CHF (congestive heart failure)     diastolic  . Sleep apnea   . Alzheimer's dementia   . Diabetes mellitus     type 2  . Hypertension   . Chronic anemia   . Osteoporosis   . Chronic renal failure   . Morbid obesity with BMI of 50.0-59.9, adult 03/09/2012  . Peripheral vascular disease   . Arthritis     Past Surgical History  Procedure Laterality Date  . Coronary artery bypass graft  09/04/08      single vessel at time of AVR  . Aortic valve replacement  12/09    tissue valve by Dr Tyrone Sage  . Cholecystectomy  1995  . Cataract extraction    . Replacement total knee bilateral      L 2000, R 1997  . Appendectomy    . Closed reduction femoral shaft fracture  2007    R intramedullary Stacy  . Tee without cardioversion N/A 02/07/2013    Procedure: TRANSESOPHAGEAL ECHOCARDIOGRAM (TEE);  Surgeon: Donato Schultz, MD;  Location: [facility] ENDOSCOPY;  Service: Cardiovascular;  Laterality: N/A;    Current Outpatient Prescriptions  Medication Sig Dispense Refill  . acetaminophen (TYLENOL) 325 MG tablet Take 162.5 mg by mouth every 6 (six) hours as needed. For pain      . amiodarone (PACERONE) 100 MG tablet Take 1 tablet (100 mg total) by mouth 2 (two) times daily.  60 tablet  12  . aspirin EC 81 MG tablet Take 1 tablet (81 mg total) by mouth daily.  31 tablet  0  . diclofenac sodium (VOLTAREN) 1 % GEL Apply topically.      . diphenhydrAMINE-zinc acetate (BENADRYL) cream Apply 1 application topically 2 (two) times daily as needed. For itching  28.4 g  1  . docusate sodium (COLACE) 100 MG capsule Take 1 capsule (100 mg total) by mouth 2 (  two) times daily as needed. For constipation  30 capsule  0  . donepezil (ARICEPT) 10 MG tablet Take 1 tablet (10 mg total) by mouth at bedtime.  30 tablet  0  . ferrous sulfate 325 (65 FE) MG tablet Take 1 tablet (325 mg total) by mouth 2 (two) times daily.  60 tablet  0  . fish oil-omega-3 fatty acids 1000 MG capsule Take 1 g by mouth daily.      Marland Kitchen FLUTICASONE PROPIONATE  HFA IN Inhale into the lungs.      . furosemide (LASIX) 20 MG tablet Take 2 tablets (40 mg total) by mouth 2 (two) times daily.  30 tablet  0  . gabapentin (NEURONTIN) 600 MG tablet Take 300-600 mg by mouth 2 (two) times daily. Take 1/2 tab in the morning and 1 tab at bedtime      . insulin glargine (LANTUS) 100 UNIT/ML injection Inject 24 Units into the skin every evening.      . lactobacillus  acidophilus (BACID) TABS tablet Take 2 tablets by mouth 2 (two) times daily.      . metolazone (ZAROXOLYN) 2.5 MG tablet Take 2.5 mg by mouth once.      . metoprolol (LOPRESSOR) 50 MG tablet Take 1 tablet (50 mg total) by mouth 2 (two) times daily.  60 tablet  12  . Multiple Vitamin (MULTIVITAMIN) tablet Take 1 tablet by mouth daily.        . Multiple Vitamins-Minerals (PRESERVISION AREDS PO) Take 1 capsule by mouth daily.      Marland Kitchen PARoxetine (PAXIL) 40 MG tablet Take 1 tablet (40 mg total) by mouth every morning.  30 tablet  1  . polyethylene glycol (MIRALAX / GLYCOLAX) packet Take 17 g by mouth daily as needed. For constipation      . potassium chloride 20 MEQ TBCR Take 40 mEq by mouth 2 (two) times daily.  60 tablet  1  . Rivaroxaban (XARELTO) 15 MG TABS tablet Take 1 tablet (15 mg total) by mouth daily with supper.  30 tablet  0  . Sennosides (SENNA) 15 MG TABS Take 1 tablet by mouth daily as needed. For constipation      . simvastatin (ZOCOR) 10 MG tablet Take 1 tablet (10 mg total) by mouth at bedtime.  30 tablet  0  . spironolactone (ALDACTONE) 12.5 mg TABS Take 0.5 tablets (12.5 mg total) by mouth 2 (two) times daily.  60 tablet  0   No current facility-administered medications for this visit.    Allergies:    Allergies  Allergen Reactions  . Morphine And Related Other (See Comments)    "went crazy"-Hallucinations  . Celecoxib Other (See Comments)    Sore mouth  . Wellbutrin [Bupropion] Other (See Comments)    Hallucinations  . Zyrtec [Cetirizine] Rash    Social History:  The patient  reports that she has never smoked. She has never used smokeless tobacco. She reports that she does not drink alcohol or use illicit drugs.   ROS:  Please see the history of present illness.   No syncope, no fevers, no bleeding, no orthopnea   All other systems reviewed and negative.   PHYSICAL EXAM: VS:  BP 152/51  Pulse 76  Ht 5\' 3"  (1.6 m)  Wt 228 lb (103.42 kg)  BMI 40.4 kg/m2 Well  nourished, well developed, in no acute distress HEENT: normal Neck: no JVD Cardiac:  normal S1, S2; RRR; no murmur Lungs:  clear to auscultation bilaterally, no  wheezing, rhonchi or rales Abd: soft, nontender, no hepatomegaly Ext: no edema Skin: warm and dry Neuro: no focal abnormalities noted  EKG:  Atrial fibrillation, left bundle branch block, 72    ASSESSMENT AND PLAN:  1. Atrial fibrillation-currently on anticoagulation. No evidence of bleeding. Xarelto 15mg . no changes made. Well rate controlled. 2. Chronic diastolic heart failure-he is currently stable. I'm fine with her using metolazone on a once weekly basis. She understands that if she overdoes this, this may result in worsening hypokalemia and renal failure. Checking basic metabolic profile today. 3. Coronary artery disease-status post bypass, doing well, no exertional anginal symptoms. 4. Hypertension-currently well controlled. 5. Diabetes-per primary physician 6. Morbid obesity-continue to encourage weight loss.  Signed, Donato Schultz, MD [facility]  07/19/2013 3:22 PM

## 2013-07-20 ENCOUNTER — Encounter: Payer: Self-pay | Admitting: Cardiology

## 2013-07-20 DIAGNOSIS — I5032 Chronic diastolic (congestive) heart failure: Secondary | ICD-10-CM

## 2013-07-20 HISTORY — DX: Chronic diastolic (congestive) heart failure: I50.32

## 2013-07-23 ENCOUNTER — Encounter (HOSPITAL_COMMUNITY): Payer: Self-pay | Admitting: Emergency Medicine

## 2013-07-23 ENCOUNTER — Inpatient Hospital Stay (HOSPITAL_COMMUNITY)
Admission: EM | Admit: 2013-07-23 | Discharge: 2013-07-26 | DRG: 193 | Disposition: A | Discharge status: Home Health | Payer: Medicare Other | Attending: Internal Medicine | Admitting: Internal Medicine

## 2013-07-23 ENCOUNTER — Emergency Department (HOSPITAL_COMMUNITY): Payer: Medicare Other

## 2013-07-23 DIAGNOSIS — Z96659 Presence of unspecified artificial knee joint: Secondary | ICD-10-CM

## 2013-07-23 DIAGNOSIS — I739 Peripheral vascular disease, unspecified: Secondary | ICD-10-CM | POA: Diagnosis present

## 2013-07-23 DIAGNOSIS — E1142 Type 2 diabetes mellitus with diabetic polyneuropathy: Secondary | ICD-10-CM | POA: Diagnosis present

## 2013-07-23 DIAGNOSIS — Z6841 Body Mass Index (BMI) 40.0 and over, adult: Secondary | ICD-10-CM

## 2013-07-23 DIAGNOSIS — J209 Acute bronchitis, unspecified: Secondary | ICD-10-CM | POA: Diagnosis present

## 2013-07-23 DIAGNOSIS — J449 Chronic obstructive pulmonary disease, unspecified: Secondary | ICD-10-CM | POA: Diagnosis present

## 2013-07-23 DIAGNOSIS — Z79899 Other long term (current) drug therapy: Secondary | ICD-10-CM

## 2013-07-23 DIAGNOSIS — I5032 Chronic diastolic (congestive) heart failure: Secondary | ICD-10-CM | POA: Diagnosis present

## 2013-07-23 DIAGNOSIS — Z7982 Long term (current) use of aspirin: Secondary | ICD-10-CM

## 2013-07-23 DIAGNOSIS — I4891 Unspecified atrial fibrillation: Secondary | ICD-10-CM | POA: Diagnosis present

## 2013-07-23 DIAGNOSIS — I129 Hypertensive chronic kidney disease with stage 1 through stage 4 chronic kidney disease, or unspecified chronic kidney disease: Secondary | ICD-10-CM | POA: Diagnosis present

## 2013-07-23 DIAGNOSIS — Z951 Presence of aortocoronary bypass graft: Secondary | ICD-10-CM

## 2013-07-23 DIAGNOSIS — Z954 Presence of other heart-valve replacement: Secondary | ICD-10-CM

## 2013-07-23 DIAGNOSIS — G309 Alzheimer's disease, unspecified: Secondary | ICD-10-CM | POA: Diagnosis present

## 2013-07-23 DIAGNOSIS — D631 Anemia in chronic kidney disease: Secondary | ICD-10-CM | POA: Diagnosis present

## 2013-07-23 DIAGNOSIS — E871 Hypo-osmolality and hyponatremia: Secondary | ICD-10-CM | POA: Diagnosis present

## 2013-07-23 DIAGNOSIS — E785 Hyperlipidemia, unspecified: Secondary | ICD-10-CM | POA: Diagnosis present

## 2013-07-23 DIAGNOSIS — I509 Heart failure, unspecified: Secondary | ICD-10-CM | POA: Diagnosis present

## 2013-07-23 DIAGNOSIS — G4730 Sleep apnea, unspecified: Secondary | ICD-10-CM | POA: Diagnosis present

## 2013-07-23 DIAGNOSIS — Z794 Long term (current) use of insulin: Secondary | ICD-10-CM

## 2013-07-23 DIAGNOSIS — F330 Major depressive disorder, recurrent, mild: Secondary | ICD-10-CM | POA: Diagnosis present

## 2013-07-23 DIAGNOSIS — M810 Age-related osteoporosis without current pathological fracture: Secondary | ICD-10-CM | POA: Diagnosis present

## 2013-07-23 DIAGNOSIS — I2510 Atherosclerotic heart disease of native coronary artery without angina pectoris: Secondary | ICD-10-CM | POA: Diagnosis present

## 2013-07-23 DIAGNOSIS — F0280 Dementia in other diseases classified elsewhere without behavioral disturbance: Secondary | ICD-10-CM | POA: Diagnosis present

## 2013-07-23 DIAGNOSIS — J9600 Acute respiratory failure, unspecified whether with hypoxia or hypercapnia: Secondary | ICD-10-CM | POA: Diagnosis present

## 2013-07-23 DIAGNOSIS — D696 Thrombocytopenia, unspecified: Secondary | ICD-10-CM

## 2013-07-23 DIAGNOSIS — J189 Pneumonia, unspecified organism: Principal | ICD-10-CM | POA: Diagnosis present

## 2013-07-23 DIAGNOSIS — I10 Essential (primary) hypertension: Secondary | ICD-10-CM | POA: Diagnosis present

## 2013-07-23 DIAGNOSIS — E1149 Type 2 diabetes mellitus with other diabetic neurological complication: Secondary | ICD-10-CM | POA: Diagnosis present

## 2013-07-23 DIAGNOSIS — L659 Nonscarring hair loss, unspecified: Secondary | ICD-10-CM

## 2013-07-23 DIAGNOSIS — J440 Chronic obstructive pulmonary disease with acute lower respiratory infection: Secondary | ICD-10-CM | POA: Diagnosis present

## 2013-07-23 DIAGNOSIS — N189 Chronic kidney disease, unspecified: Secondary | ICD-10-CM | POA: Diagnosis present

## 2013-07-23 DIAGNOSIS — I447 Left bundle-branch block, unspecified: Secondary | ICD-10-CM | POA: Diagnosis present

## 2013-07-23 DIAGNOSIS — J9601 Acute respiratory failure with hypoxia: Secondary | ICD-10-CM | POA: Diagnosis present

## 2013-07-23 LAB — CBC WITH DIFFERENTIAL/PLATELET
Eosinophils Absolute: 0 10*3/uL (ref 0.0–0.7)
Hemoglobin: 15 g/dL (ref 12.0–15.0)
Lymphocytes Relative: 12 % (ref 12–46)
Lymphs Abs: 1 10*3/uL (ref 0.7–4.0)
MCH: 32 pg (ref 26.0–34.0)
MCV: 92.1 fL (ref 78.0–100.0)
Monocytes Relative: 12 % (ref 3–12)
Neutrophils Relative %: 76 % (ref 43–77)
RBC: 4.69 MIL/uL (ref 3.87–5.11)
WBC: 8.2 10*3/uL (ref 4.0–10.5)

## 2013-07-23 LAB — POCT I-STAT TROPONIN I: Troponin i, poc: 0.03 ng/mL (ref 0.00–0.08)

## 2013-07-23 LAB — BASIC METABOLIC PANEL
CO2: 27 mEq/L (ref 19–32)
GFR calc non Af Amer: 47 mL/min — ABNORMAL LOW (ref >90)
Glucose, Bld: 158 mg/dL — ABNORMAL HIGH (ref 70–99)
Potassium: 4 mEq/L (ref 3.5–5.1)
Sodium: 128 mEq/L — ABNORMAL LOW (ref 135–145)

## 2013-07-23 LAB — PROTIME-INR: INR: 1.25 (ref 0.00–1.49)

## 2013-07-23 MED ORDER — IPRATROPIUM BROMIDE 0.02 % IN SOLN
0.5000 mg | Freq: Once | RESPIRATORY_TRACT | Status: AC
  Administered 2013-07-23: 0.5 mg via RESPIRATORY_TRACT
  Filled 2013-07-23: qty 2.5

## 2013-07-23 MED ORDER — ALBUTEROL SULFATE (5 MG/ML) 0.5% IN NEBU
2.5000 mg | INHALATION_SOLUTION | Freq: Once | RESPIRATORY_TRACT | Status: AC
  Administered 2013-07-23: 2.5 mg via RESPIRATORY_TRACT
  Filled 2013-07-23: qty 0.5

## 2013-07-23 MED ORDER — METHYLPREDNISOLONE SODIUM SUCC 125 MG IJ SOLR
125.0000 mg | Freq: Once | INTRAMUSCULAR | Status: AC
  Administered 2013-07-24: 125 mg via INTRAVENOUS
  Filled 2013-07-23: qty 2

## 2013-07-23 NOTE — ED Provider Notes (Signed)
CSN: [id number]     Arrival date & time 07/23/13  1842 History   First MD Initiated Contact with Patient 07/23/13 1956     Chief Complaint  Patient presents with  . Extremity Weakness  . Cough   (Consider location/radiation/quality/duration/timing/severity/associated sxs/prior Treatment) Patient is a 79 y.o. female presenting with extremity weakness and cough. The history is provided by the patient.  Extremity Weakness  Cough She has had a cough for about the last 3 days. Cough is productive of clear sputum. She initially had fever which has subsided. She did not check her temperature. She denies chills or sweats. Denies chest pain, heaviness, tightness, pressure. She denies nausea, vomiting. Nothing makes her symptoms better nothing makes them worse. In spite of cough, she denies dyspnea.  Past Medical History  Diagnosis Date  . Coronary artery disease 09/04/08    single vessel CABG at time of AVR   . Aortic stenosis 09/04/08    s/p AVR by Dr Tyrone Sage (tissue valve)  . Paroxysmal atrial fibrillation   . LBBB (left bundle branch block)   . CHF (congestive heart failure)     diastolic  . Sleep apnea   . Alzheimer's dementia   . Diabetes mellitus     type 2  . Hypertension   . Chronic anemia   . Osteoporosis   . Chronic renal failure   . Morbid obesity with BMI of 50.0-59.9, adult 03/09/2012  . Peripheral vascular disease   . Arthritis   . Chronic diastolic heart failure 07/20/2013   Past Surgical History  Procedure Laterality Date  . Coronary artery bypass graft  09/04/08    single vessel at time of AVR  . Aortic valve replacement  12/09    tissue valve by Dr Tyrone Sage  . Cholecystectomy  1995  . Cataract extraction    . Replacement total knee bilateral      L 2000, R 1997  . Appendectomy    . Closed reduction femoral shaft fracture  2007    R intramedullary rod  . Tee without cardioversion N/A 02/07/2013    Procedure: TRANSESOPHAGEAL ECHOCARDIOGRAM (TEE);  Surgeon: Donato Schultz, MD;  Location: [facility] ENDOSCOPY;  Service: Cardiovascular;  Laterality: N/A;   Family History  Problem Relation Age of Onset  . Other Father 53    deceased. multiple MI   . Lung cancer Mother 4    died   History  Substance Use Topics  . Smoking status: Never Smoker   . Smokeless tobacco: Never Used  . Alcohol Use: No   OB History   Grav Para Term Preterm Abortions TAB SAB Ect Mult Living                 Review of Systems  Respiratory: Positive for cough.   Musculoskeletal: Positive for extremity weakness.  All other systems reviewed and are negative.    Allergies  Morphine and related; Celecoxib; Codeine; Oxycodone; Wellbutrin; and Zyrtec  Home Medications   Current Outpatient Rx  Name  Route  Sig  Dispense  Refill  . acetaminophen (TYLENOL) 325 MG tablet   Oral   Take 162.5 mg by mouth every 6 (six) hours as needed. For pain         . amiodarone (PACERONE) 100 MG tablet   Oral   Take 1 tablet (100 mg total) by mouth 2 (two) times daily.   60 tablet   12   . aspirin EC 81 MG tablet   Oral   Take  1 tablet (81 mg total) by mouth daily.   31 tablet   0   . diclofenac sodium (VOLTAREN) 1 % GEL   Topical   Apply topically.         . diphenhydrAMINE-zinc acetate (BENADRYL) cream   Topical   Apply 1 application topically 2 (two) times daily as needed. For itching   28.4 g   1   . docusate sodium (COLACE) 100 MG capsule   Oral   Take 1 capsule (100 mg total) by mouth 2 (two) times daily as needed. For constipation   30 capsule   0   . donepezil (ARICEPT) 10 MG tablet   Oral   Take 1 tablet (10 mg total) by mouth at bedtime.   30 tablet   0   . ferrous sulfate 325 (65 FE) MG tablet   Oral   Take 1 tablet (325 mg total) by mouth 2 (two) times daily.   60 tablet   0   . fish oil-omega-3 fatty acids 1000 MG capsule   Oral   Take 1 g by mouth daily.         Marland Kitchen FLUTICASONE PROPIONATE  HFA IN   Inhalation   Inhale into the lungs.          . furosemide (LASIX) 20 MG tablet   Oral   Take 2 tablets (40 mg total) by mouth 2 (two) times daily.   30 tablet   0   . gabapentin (NEURONTIN) 600 MG tablet   Oral   Take 300-600 mg by mouth 2 (two) times daily. Take 1/2 tab in the morning and 1 tab at bedtime         . insulin glargine (LANTUS) 100 UNIT/ML injection   Subcutaneous   Inject 24 Units into the skin every evening.         . lactobacillus acidophilus (BACID) TABS tablet   Oral   Take 2 tablets by mouth 2 (two) times daily.         . metolazone (ZAROXOLYN) 2.5 MG tablet   Oral   Take 2.5 mg by mouth once.         . metoprolol (LOPRESSOR) 50 MG tablet   Oral   Take 1 tablet (50 mg total) by mouth 2 (two) times daily.   60 tablet   12   . Multiple Vitamin (MULTIVITAMIN) tablet   Oral   Take 1 tablet by mouth daily.           . Multiple Vitamins-Minerals (PRESERVISION AREDS PO)   Oral   Take 1 capsule by mouth daily.         Marland Kitchen PARoxetine (PAXIL) 40 MG tablet   Oral   Take 1 tablet (40 mg total) by mouth every morning.   30 tablet   1   . polyethylene glycol (MIRALAX / GLYCOLAX) packet   Oral   Take 17 g by mouth daily as needed. For constipation         . potassium chloride 20 MEQ TBCR   Oral   Take 40 mEq by mouth 2 (two) times daily.   60 tablet   1   . Rivaroxaban (XARELTO) 15 MG TABS tablet   Oral   Take 1 tablet (15 mg total) by mouth daily with supper.   30 tablet   0   . Sennosides (SENNA) 15 MG TABS   Oral   Take 1 tablet by mouth daily as needed. For constipation         .  simvastatin (ZOCOR) 10 MG tablet   Oral   Take 1 tablet (10 mg total) by mouth at bedtime.   30 tablet   0   . spironolactone (ALDACTONE) 12.5 mg TABS   Oral   Take 0.5 tablets (12.5 mg total) by mouth 2 (two) times daily.   60 tablet   0    BP 134/81  Pulse 105  Temp(Src) 99.4 F (37.4 C) (Oral)  Resp 24  SpO2 97% Physical Exam  Nursing note and vitals reviewed.  79 year  old female, resting comfortably and in no acute distress. Vital signs are significant for tachycardia very 105, and tachypnea with respiratory rate of 24. Oxygen saturation is 97%, which is normal. Head is normocephalic and atraumatic. PERRLA, EOMI. Oropharynx is clear. Neck is nontender and supple without adenopathy or JVD. Back is nontender and there is no CVA tenderness. Lungs have diffuse expiratory wheezes without rales or rhonchi. Chest is nontender. Heart has regular rate and rhythm without murmur. Abdomen is soft, flat, nontender without masses or hepatosplenomegaly and peristalsis is normoactive. Extremities have no cyanosis or edema, full range of motion is present. Skin is warm and dry without rash. Neurologic: Mental status is normal, cranial nerves are intact, there are no motor or sensory deficits.  ED Course  Procedures (including critical care time) Labs Review Results for orders placed during the hospital encounter of 07/23/13  PRO B NATRIURETIC PEPTIDE      Result Value Range   Pro B Natriuretic peptide (BNP) 6746.0 (*) 0 - 450 pg/mL  BASIC METABOLIC PANEL      Result Value Range   Sodium 128 (*) 135 - 145 mEq/L   Potassium 4.0  3.5 - 5.1 mEq/L   Chloride 90 (*) 96 - 112 mEq/L   CO2 27  19 - 32 mEq/L   Glucose, Bld 158 (*) 70 - 99 mg/dL   BUN 20  6 - 23 mg/dL   Creatinine, Ser 1.61  0.50 - 1.10 mg/dL   Calcium 9.4  8.4 - 09.6 mg/dL   GFR calc non Af Amer 47 (*) >90 mL/min   GFR calc Af Amer 55 (*) >90 mL/min  CBC WITH DIFFERENTIAL      Result Value Range   WBC 8.2  4.0 - 10.5 K/uL   RBC 4.69  3.87 - 5.11 MIL/uL   Hemoglobin 15.0  12.0 - 15.0 g/dL   HCT 04.5  40.9 - 81.1 %   MCV 92.1  78.0 - 100.0 fL   MCH 32.0  26.0 - 34.0 pg   MCHC 34.7  30.0 - 36.0 g/dL   RDW 91.4  78.2 - 95.6 %   Platelets 99 (*) 150 - 400 K/uL   Neutrophils Relative % 76  43 - 77 %   Neutro Abs 6.2  1.7 - 7.7 K/uL   Lymphocytes Relative 12  12 - 46 %   Lymphs Abs 1.0  0.7 - 4.0 K/uL    Monocytes Relative 12  3 - 12 %   Monocytes Absolute 1.0  0.1 - 1.0 K/uL   Eosinophils Relative 0  0 - 5 %   Eosinophils Absolute 0.0  0.0 - 0.7 K/uL   Basophils Relative 0  0 - 1 %   Basophils Absolute 0.0  0.0 - 0.1 K/uL  PROTIME-INR      Result Value Range   Prothrombin Time 15.4 (*) 11.6 - 15.2 seconds   INR 1.25  0.00 - 1.49  POCT I-STAT TROPONIN  I      Result Value Range   Troponin i, poc 0.03  0.00 - 0.08 ng/mL   Comment 3            Imaging Review Dg Chest Portable 1 View  07/23/2013   CLINICAL DATA:  Wheezing and cough.  EXAM: PORTABLE CHEST - 1 VIEW  COMPARISON:  03/13/2013  FINDINGS: Previous median sternotomy and valve replacement. Moderate cardiomegaly persists. Mild perihilar interstitial infiltrates or edema and central pulmonary vascular congestion as before. New poorly marginated consolidation at the right lung base. No definite effusion. .  IMPRESSION: 1. Cardiomegaly, interstitial edema and vascular congestion as before. 2. New focal airspace opacity at the right lung base suggesting superimposed pneumonia.   Electronically Signed   By: Oley Balm M.D.   On: 07/23/2013 20:26    EKG Interpretation     Ventricular Rate:  105 PR Interval:  112 QRS Duration: 159 QT Interval:  418 QTC Calculation: 552 R Axis:   -118 Text Interpretation:  Sinus tachycardia Left bundle branch block When compared with ECG of 03/13/2013, Sinus rhythm has replaced Atrial fibrillation            MDM  No diagnosis found. Cough with wheezing which probably represents acute bronchitis. Chest x-ray will be obtained to rule out pneumonia. BNP is moderately elevated but at her baseline and I do not believe this represents an exacerbation of her CHF. Thrombocytopeniacytopenia is essentially unchanged from baseline. She will be given albuterol with ipratropium and reassessed. She is also given a dose of methylprednisolone.  After above-noted treatment, patient stated she felt better  but wheezing was essentially unchanged. She'll be given a second nebulizer treatment.  After second nebulizer treatment, once again patient states that she feels better but wheezing persisted she'll be given a third nebulizer treatment. Family is present and concerned that she is at home alone and are hoping that she will need to be admitted. Case we signed out to Dr. Norlene Campbell to reassess patient following third treatment.  Dione Booze, MD 07/24/13 [phone]

## 2013-07-23 NOTE — ED Notes (Signed)
Family called EMS stating that pt was cold and clammy and that she has had a fever for the last few days.  States pt is always in atrial flutter.  Per EMS, pt warm and dry.  HR 104, a-flutter.  Placed on 2L Laura for RA sats of 92%.

## 2013-07-23 NOTE — ED Notes (Signed)
Family concerned that MD will discharge patient because "she cant walk and is alone during days"

## 2013-07-24 ENCOUNTER — Encounter (HOSPITAL_COMMUNITY): Payer: Self-pay | Admitting: *Deleted

## 2013-07-24 DIAGNOSIS — L659 Nonscarring hair loss, unspecified: Secondary | ICD-10-CM

## 2013-07-24 DIAGNOSIS — J9600 Acute respiratory failure, unspecified whether with hypoxia or hypercapnia: Secondary | ICD-10-CM

## 2013-07-24 DIAGNOSIS — J9601 Acute respiratory failure with hypoxia: Secondary | ICD-10-CM | POA: Diagnosis present

## 2013-07-24 DIAGNOSIS — J449 Chronic obstructive pulmonary disease, unspecified: Secondary | ICD-10-CM | POA: Diagnosis present

## 2013-07-24 DIAGNOSIS — J189 Pneumonia, unspecified organism: Secondary | ICD-10-CM | POA: Diagnosis present

## 2013-07-24 DIAGNOSIS — J209 Acute bronchitis, unspecified: Secondary | ICD-10-CM

## 2013-07-24 LAB — BASIC METABOLIC PANEL
BUN: 20 mg/dL (ref 6–23)
Calcium: 9 mg/dL (ref 8.4–10.5)
Creatinine, Ser: 1.05 mg/dL (ref 0.50–1.10)
GFR calc Af Amer: 57 mL/min — ABNORMAL LOW (ref >90)
GFR calc non Af Amer: 50 mL/min — ABNORMAL LOW (ref >90)

## 2013-07-24 LAB — CBC
MCHC: 34 g/dL (ref 30.0–36.0)
RDW: 15.1 % (ref 11.5–15.5)

## 2013-07-24 LAB — GLUCOSE, CAPILLARY
Glucose-Capillary: 273 mg/dL — ABNORMAL HIGH (ref 70–99)
Glucose-Capillary: 321 mg/dL — ABNORMAL HIGH (ref 70–99)
Glucose-Capillary: 238 mg/dL — ABNORMAL HIGH (ref 70–99)

## 2013-07-24 LAB — HEMOGLOBIN A1C: Mean Plasma Glucose: 148 mg/dL — ABNORMAL HIGH (ref <117)

## 2013-07-24 LAB — MRSA PCR SCREENING: MRSA by PCR: NEGATIVE

## 2013-07-24 MED ORDER — DOCUSATE SODIUM 100 MG PO CAPS
100.0000 mg | ORAL_CAPSULE | Freq: Two times a day (BID) | ORAL | Status: DC | PRN
  Filled 2013-07-24: qty 1

## 2013-07-24 MED ORDER — ALBUTEROL SULFATE (5 MG/ML) 0.5% IN NEBU
2.5000 mg | INHALATION_SOLUTION | Freq: Once | RESPIRATORY_TRACT | Status: AC
  Administered 2013-07-24: 2.5 mg via RESPIRATORY_TRACT
  Filled 2013-07-24: qty 0.5

## 2013-07-24 MED ORDER — FLUTICASONE PROPIONATE HFA 44 MCG/ACT IN AERO
2.0000 | INHALATION_SPRAY | Freq: Two times a day (BID) | RESPIRATORY_TRACT | Status: DC
  Administered 2013-07-24 – 2013-07-26 (×5): 2 via RESPIRATORY_TRACT
  Filled 2013-07-24: qty 10.6

## 2013-07-24 MED ORDER — DONEPEZIL HCL 10 MG PO TABS
10.0000 mg | ORAL_TABLET | Freq: Every day | ORAL | Status: DC
  Administered 2013-07-24 – 2013-07-25 (×2): 10 mg via ORAL
  Filled 2013-07-24 (×3): qty 1

## 2013-07-24 MED ORDER — IPRATROPIUM BROMIDE 0.02 % IN SOLN
0.5000 mg | Freq: Once | RESPIRATORY_TRACT | Status: AC
  Administered 2013-07-24: 0.5 mg via RESPIRATORY_TRACT
  Filled 2013-07-24: qty 2.5

## 2013-07-24 MED ORDER — GABAPENTIN 600 MG PO TABS
600.0000 mg | ORAL_TABLET | Freq: Every day | ORAL | Status: DC
  Administered 2013-07-24 – 2013-07-25 (×2): 600 mg via ORAL
  Filled 2013-07-24 (×3): qty 1

## 2013-07-24 MED ORDER — SODIUM CHLORIDE 0.9 % IJ SOLN
3.0000 mL | Freq: Two times a day (BID) | INTRAMUSCULAR | Status: DC
  Administered 2013-07-24 – 2013-07-25 (×2): 3 mL via INTRAVENOUS

## 2013-07-24 MED ORDER — AZITHROMYCIN 500 MG PO TABS
500.0000 mg | ORAL_TABLET | ORAL | Status: DC
  Administered 2013-07-25 (×2): 500 mg via ORAL
  Filled 2013-07-24 (×3): qty 1

## 2013-07-24 MED ORDER — POLYETHYLENE GLYCOL 3350 17 G PO PACK
17.0000 g | PACK | Freq: Every day | ORAL | Status: DC | PRN
  Filled 2013-07-24: qty 1

## 2013-07-24 MED ORDER — ALBUTEROL SULFATE (5 MG/ML) 0.5% IN NEBU
2.5000 mg | INHALATION_SOLUTION | Freq: Four times a day (QID) | RESPIRATORY_TRACT | Status: DC
  Administered 2013-07-25 – 2013-07-26 (×7): 2.5 mg via RESPIRATORY_TRACT
  Filled 2013-07-24 (×7): qty 0.5

## 2013-07-24 MED ORDER — SODIUM CHLORIDE 0.9 % IJ SOLN: 3.0000 mL | INTRAMUSCULAR | Status: DC | PRN

## 2013-07-24 MED ORDER — RIVAROXABAN 15 MG PO TABS
15.0000 mg | ORAL_TABLET | Freq: Every day | ORAL | Status: DC
  Administered 2013-07-24 – 2013-07-26 (×3): 15 mg via ORAL
  Filled 2013-07-24 (×3): qty 1

## 2013-07-24 MED ORDER — ENOXAPARIN SODIUM 40 MG/0.4ML ~~LOC~~ SOLN: 40.0000 mg | SUBCUTANEOUS | Status: DC

## 2013-07-24 MED ORDER — INSULIN GLARGINE 100 UNIT/ML ~~LOC~~ SOLN
24.0000 [IU] | Freq: Every evening | SUBCUTANEOUS | Status: DC
  Administered 2013-07-24 – 2013-07-25 (×2): 24 [IU] via SUBCUTANEOUS
  Filled 2013-07-24 (×5): qty 0.24

## 2013-07-24 MED ORDER — METOPROLOL TARTRATE 50 MG PO TABS
50.0000 mg | ORAL_TABLET | Freq: Two times a day (BID) | ORAL | Status: DC
  Administered 2013-07-24 – 2013-07-26 (×5): 50 mg via ORAL
  Filled 2013-07-24 (×7): qty 1

## 2013-07-24 MED ORDER — POTASSIUM CHLORIDE ER 10 MEQ PO TBCR
40.0000 meq | EXTENDED_RELEASE_TABLET | Freq: Two times a day (BID) | ORAL | Status: DC
  Administered 2013-07-24 – 2013-07-26 (×5): 40 meq via ORAL
  Filled 2013-07-24 (×6): qty 4

## 2013-07-24 MED ORDER — GABAPENTIN 300 MG PO CAPS
300.0000 mg | ORAL_CAPSULE | Freq: Every day | ORAL | Status: DC
  Administered 2013-07-24 – 2013-07-26 (×3): 300 mg via ORAL
  Filled 2013-07-24 (×3): qty 1

## 2013-07-24 MED ORDER — ALBUTEROL SULFATE (5 MG/ML) 0.5% IN NEBU: 2.5000 mg | INHALATION_SOLUTION | RESPIRATORY_TRACT | Status: DC | PRN

## 2013-07-24 MED ORDER — FERROUS SULFATE 325 (65 FE) MG PO TABS
325.0000 mg | ORAL_TABLET | Freq: Two times a day (BID) | ORAL | Status: DC
  Administered 2013-07-24 – 2013-07-26 (×5): 325 mg via ORAL
  Filled 2013-07-24 (×6): qty 1

## 2013-07-24 MED ORDER — ALBUTEROL SULFATE (5 MG/ML) 0.5% IN NEBU
2.5000 mg | INHALATION_SOLUTION | RESPIRATORY_TRACT | Status: DC
  Administered 2013-07-24 (×5): 2.5 mg via RESPIRATORY_TRACT
  Filled 2013-07-24 (×5): qty 0.5

## 2013-07-24 MED ORDER — AMIODARONE HCL 100 MG PO TABS
100.0000 mg | ORAL_TABLET | Freq: Two times a day (BID) | ORAL | Status: DC
  Administered 2013-07-24 – 2013-07-26 (×5): 100 mg via ORAL
  Filled 2013-07-24 (×6): qty 1

## 2013-07-24 MED ORDER — METOLAZONE 2.5 MG PO TABS
2.5000 mg | ORAL_TABLET | Freq: Once | ORAL | Status: AC
  Administered 2013-07-24: 2.5 mg via ORAL
  Filled 2013-07-24: qty 1

## 2013-07-24 MED ORDER — PAROXETINE HCL 20 MG PO TABS
40.0000 mg | ORAL_TABLET | Freq: Every day | ORAL | Status: DC
  Administered 2013-07-24 – 2013-07-26 (×3): 40 mg via ORAL
  Filled 2013-07-24 (×3): qty 2

## 2013-07-24 MED ORDER — SIMVASTATIN 10 MG PO TABS
10.0000 mg | ORAL_TABLET | Freq: Every day | ORAL | Status: DC
  Administered 2013-07-24 – 2013-07-25 (×2): 10 mg via ORAL
  Filled 2013-07-24 (×3): qty 1

## 2013-07-24 MED ORDER — DEXTROSE 5 % IV SOLN
1.0000 g | INTRAVENOUS | Status: DC
  Administered 2013-07-24 – 2013-07-25 (×2): 1 g via INTRAVENOUS
  Filled 2013-07-24 (×3): qty 10

## 2013-07-24 MED ORDER — DEXTROSE 5 % IV SOLN
1.0000 g | Freq: Once | INTRAVENOUS | Status: AC
  Administered 2013-07-24: 1 g via INTRAVENOUS
  Filled 2013-07-24: qty 10

## 2013-07-24 MED ORDER — INSULIN ASPART 100 UNIT/ML ~~LOC~~ SOLN
0.0000 [IU] | Freq: Three times a day (TID) | SUBCUTANEOUS | Status: DC
  Administered 2013-07-24: 5 [IU] via SUBCUTANEOUS
  Administered 2013-07-24: 3 [IU] via SUBCUTANEOUS
  Administered 2013-07-24: 7 [IU] via SUBCUTANEOUS
  Administered 2013-07-25: 2 [IU] via SUBCUTANEOUS
  Administered 2013-07-25: 5 [IU] via SUBCUTANEOUS
  Administered 2013-07-25: 9 [IU] via SUBCUTANEOUS
  Administered 2013-07-26 (×2): 3 [IU] via SUBCUTANEOUS
  Administered 2013-07-26: 9 [IU] via SUBCUTANEOUS

## 2013-07-24 MED ORDER — ALBUTEROL SULFATE (5 MG/ML) 0.5% IN NEBU: 2.5000 mg | INHALATION_SOLUTION | Freq: Four times a day (QID) | RESPIRATORY_TRACT | Status: DC

## 2013-07-24 MED ORDER — SODIUM CHLORIDE 0.9 % IV SOLN: 250.0000 mL | INTRAVENOUS | Status: DC | PRN

## 2013-07-24 MED ORDER — FUROSEMIDE 40 MG PO TABS
40.0000 mg | ORAL_TABLET | Freq: Two times a day (BID) | ORAL | Status: DC
  Administered 2013-07-24 – 2013-07-26 (×6): 40 mg via ORAL
  Filled 2013-07-24 (×7): qty 1

## 2013-07-24 MED ORDER — ACETAMINOPHEN 325 MG PO TABS: 162.5000 mg | ORAL_TABLET | Freq: Four times a day (QID) | ORAL | Status: DC | PRN

## 2013-07-24 MED ORDER — DEXTROSE 5 % IV SOLN
500.0000 mg | Freq: Once | INTRAVENOUS | Status: DC
  Administered 2013-07-24: 500 mg via INTRAVENOUS

## 2013-07-24 MED ORDER — ASPIRIN EC 81 MG PO TBEC
81.0000 mg | DELAYED_RELEASE_TABLET | Freq: Every day | ORAL | Status: DC
  Administered 2013-07-24 – 2013-07-26 (×3): 81 mg via ORAL
  Filled 2013-07-24 (×3): qty 1

## 2013-07-24 MED ORDER — METHYLPREDNISOLONE SODIUM SUCC 40 MG IJ SOLR
40.0000 mg | Freq: Four times a day (QID) | INTRAMUSCULAR | Status: DC
  Administered 2013-07-24 – 2013-07-25 (×5): 40 mg via INTRAVENOUS
  Filled 2013-07-24 (×9): qty 1

## 2013-07-24 MED ORDER — SPIRONOLACTONE 12.5 MG HALF TABLET
12.5000 mg | ORAL_TABLET | Freq: Two times a day (BID) | ORAL | Status: DC
  Administered 2013-07-24 – 2013-07-26 (×6): 12.5 mg via ORAL
  Filled 2013-07-24 (×7): qty 1

## 2013-07-24 NOTE — Progress Notes (Signed)
Utilization review completed.  

## 2013-07-24 NOTE — H&P (Addendum)
[facility] History and Physical  [name] [facility] AVW:[id number] DOB: May 08, 1933 DOA: 07/23/2013  Referring physician: ED physician PCP: [name], MD   Chief Complaint: shortness of breath   HPI:  Pt is 79 yo female with complicated and complex medical history who preened to [facility] ED with main concern of progressively worsening shortness of breath that initially started with exertion and now present at rest, associated with wheezing, subjective fevers, chills, cough productive of yellow sputum. This started several days prior tot his admission and has not been getting better. NO specific alleviating or aggravating factors, no similar events in the past. She denies chest pain, no N/V/D, no neurological symptoms.  [facility] asked to admit for further management of COPD and ? PNA. Pt started on Zithromax and Rocephin in ED.  Assessment and Plan:  Principal Problem:   Acute respiratory failure with hypoxia - most likely secondary to COPD exacerbation and PNA - will admit to telemetry bed - provide supportive care with oxygen, solumedrol, BD's - monitor vitals per floor protocol  Active Problems:   PNA (pneumonia) - sputum analysis requested, urine legionella and strep pneumo  - ABX as noted below    COPD (chronic obstructive pulmonary disease) - management with solumedrol, BD's scheduled and as needed - empiric ABX Zithromax and Rocephin for now    Diabetes - check A1C, continue home insulin regimen and add SSI   DIABETIC PERIPHERAL NEUROPATHY - continue neurontin   Atrial fibrillation - continue Amiodarone and Xarelto   HYPERLIPIDEMIA - continue statin    ANEMIA OF RENAL FAILURE - Hg and Hct stable and at baseline  - CBC in AM   Chronic diastolic heart failure - appears to be clinically compensated - strict I's and O's, daily weights - continue Lasix    MAJOR DEPRESSIVE DISORDER RECURRENT EPISODE MILD - continue Paxil   HYPERTENSION, CONTROLLED - reasonably stable BP  on admission   Code Status: Full Family Communication: No family at beside  Disposition Plan: Admit to telemetry bed   Review of Systems:  Constitutional:Negative for diaphoresis.  HENT: Negative for hearing loss, ear pain, nosebleeds, congestion, sore throat, neck pain, tinnitus and ear discharge.   Eyes: Negative for blurred vision, double vision, photophobia, pain, discharge and redness.  Respiratory: Per HPI .   Cardiovascular: Negative for chest pain, palpitations, orthopnea, claudication and leg swelling.  Gastrointestinal: Negative for heartburn, constipation, blood in stool and melena.  Genitourinary: Negative for dysuria, urgency, frequency, hematuria and flank pain.  Musculoskeletal: Negative for myalgias, back pain, joint pain and falls.  Skin: Negative for itching and rash.  Neurological: Negative for tingling, tremors, sensory change, speech change, focal weakness, loss of consciousness and headaches.  Endo/Heme/Allergies: Negative for environmental allergies and polydipsia. Does not bruise/bleed easily.  Psychiatric/Behavioral: Negative for suicidal ideas. The patient is not nervous/anxious.      Past Medical History  Diagnosis Date  . Coronary artery disease 09/04/08    single vessel CABG at time of AVR   . Aortic stenosis 09/04/08    s/p AVR by Dr Tyrone Sage (tissue valve)  . Paroxysmal atrial fibrillation   . LBBB (left bundle branch block)   . CHF (congestive heart failure)     diastolic  . Sleep apnea   . Alzheimer's dementia   . Diabetes mellitus     type 2  . Hypertension   . Chronic anemia   . Osteoporosis   . Chronic renal failure   . Morbid obesity with BMI of 50.0-59.9, adult  03/09/2012  . Peripheral vascular disease   . Arthritis   . Chronic diastolic heart failure 07/20/2013    Past Surgical History  Procedure Laterality Date  . Coronary artery bypass graft  09/04/08    single vessel at time of AVR  . Aortic valve replacement  12/09    tissue  valve by Dr Tyrone Sage  . Cholecystectomy  1995  . Cataract extraction    . Replacement total knee bilateral      L 2000, R 1997  . Appendectomy    . Closed reduction femoral shaft fracture  2007    R intramedullary rod  . Tee without cardioversion N/A 02/07/2013    Procedure: TRANSESOPHAGEAL ECHOCARDIOGRAM (TEE);  Surgeon: Donato Schultz, MD;  Location: [facility] ENDOSCOPY;  Service: Cardiovascular;  Laterality: N/A;    Social History:  reports that she has never smoked. She has never used smokeless tobacco. She reports that she does not drink alcohol or use illicit drugs.  Allergies  Allergen Reactions  . Morphine And Related Other (See Comments)    "went crazy"-Hallucinations  . Celecoxib Other (See Comments)    Sore mouth  . Codeine   . Oxycodone   . Wellbutrin [Bupropion] Other (See Comments)    Hallucinations  . Zyrtec [Cetirizine] Rash    Family History  Problem Relation Age of Onset  . Other Father 57    deceased. multiple MI   . Lung cancer Mother 4    died    Prior to Admission medications   Medication Sig Start Date End Date Taking? Authorizing Provider  acetaminophen (TYLENOL) 325 MG tablet Take 162.5 mg by mouth every 6 (six) hours as needed. For pain    Dorothea Ogle, MD  amiodarone (PACERONE) 100 MG tablet Take 1 tablet (100 mg total) by mouth 2 (two) times daily. 02/09/13   Donato Schultz, MD  aspirin EC 81 MG tablet Take 1 tablet (81 mg total) by mouth daily. 08/11/12   Jacquelyn A McGill, MD  diclofenac sodium (VOLTAREN) 1 % GEL Apply topically.    Historical Provider, MD  diphenhydrAMINE-zinc acetate (BENADRYL) cream Apply 1 application topically 2 (two) times daily as needed. For itching 08/04/12 08/04/13  Dorothea Ogle, MD  docusate sodium (COLACE) 100 MG capsule Take 1 capsule (100 mg total) by mouth 2 (two) times daily as needed. For constipation 08/14/12   Brent Bulla, MD  donepezil (ARICEPT) 10 MG tablet Take 1 tablet (10 mg total) by mouth at bedtime. 08/11/12    Jacquelyn A McGill, MD  ferrous sulfate 325 (65 FE) MG tablet Take 1 tablet (325 mg total) by mouth 2 (two) times daily. 08/11/12   Jacquelyn A McGill, MD  fish oil-omega-3 fatty acids 1000 MG capsule Take 1 g by mouth daily. 08/04/12   Dorothea Ogle, MD  FLUTICASONE PROPIONATE  HFA IN Inhale into the lungs.    Historical Provider, MD  furosemide (LASIX) 20 MG tablet Take 2 tablets (40 mg total) by mouth 2 (two) times daily. 03/16/13   Leroy Sea, MD  gabapentin (NEURONTIN) 600 MG tablet Take 300-600 mg by mouth 2 (two) times daily. Take 1/2 tab in the morning and 1 tab at bedtime 08/11/12   Jacquelyn A McGill, MD  insulin glargine (LANTUS) 100 UNIT/ML injection Inject 24 Units into the skin every evening. 08/11/12   Jacquelyn A McGill, MD  lactobacillus acidophilus (BACID) TABS tablet Take 2 tablets by mouth 2 (two) times daily.    Historical Provider, MD  metolazone (ZAROXOLYN) 2.5 MG tablet Take 2.5 mg by mouth once.    Historical Provider, MD  metoprolol (LOPRESSOR) 50 MG tablet Take 1 tablet (50 mg total) by mouth 2 (two) times daily. 02/09/13   Donato Schultz, MD  Multiple Vitamin (MULTIVITAMIN) tablet Take 1 tablet by mouth daily.      Historical Provider, MD  Multiple Vitamins-Minerals (PRESERVISION AREDS PO) Take 1 capsule by mouth daily.    Historical Provider, MD  PARoxetine (PAXIL) 40 MG tablet Take 1 tablet (40 mg total) by mouth every morning. 08/04/12   Dorothea Ogle, MD  polyethylene glycol (MIRALAX / Ethelene Hal) packet Take 17 g by mouth daily as needed. For constipation    Historical Provider, MD  potassium chloride 20 MEQ TBCR Take 40 mEq by mouth 2 (two) times daily. 03/16/13   Leroy Sea, MD  Rivaroxaban (XARELTO) 15 MG TABS tablet Take 1 tablet (15 mg total) by mouth daily with supper. 03/16/13   Leroy Sea, MD  Sennosides (SENNA) 15 MG TABS Take 1 tablet by mouth daily as needed. For constipation    Historical Provider, MD  simvastatin (ZOCOR) 10 MG tablet Take 1 tablet  (10 mg total) by mouth at bedtime. 08/11/12   Jacquelyn A McGill, MD  spironolactone (ALDACTONE) 12.5 mg TABS Take 0.5 tablets (12.5 mg total) by mouth 2 (two) times daily. 08/11/12   Tito Dine, MD    Physical Exam: Filed Vitals:   07/24/13 0000 07/24/13 0100 07/24/13 0112 07/24/13 0200  BP: 134/61 133/57  149/65  Pulse: 105 106  106  Temp: 100.2 F (37.9 C)     TempSrc:      Resp: 24 22  22   SpO2: 98% 96% 95% 96%    Physical Exam  Constitutional: Appears well-developed and well-nourished.  HENT: Normocephalic. External right and left ear normal. Oropharynx dry Eyes: Conjunctivae and EOM are normal. PERRLA, no scleral icterus.  Neck: Normal ROM. Neck supple. No JVD. No tracheal deviation. No thyromegaly.  CVS: Regular rhythm, tachycardic, S1/S2 +, no murmurs, no gallops, no carotid bruit.  Pulmonary: Course breath sounds bilaterally with expiratory wheezing  Abdominal: Soft. BS +,  no distension, tenderness, rebound or guarding.  Musculoskeletal: Normal range of motion. +1 bilateral LE pitting edema and no tenderness.  Lymphadenopathy: No lymphadenopathy noted, cervical, inguinal. Neuro: Alert. Normal reflexes, muscle tone coordination. No cranial nerve deficit. Skin: Skin is warm and dry. No rash noted. Not diaphoretic. No erythema. No pallor.  Psychiatric: Normal mood and affect. Behavior, judgment, thought content normal.   Labs on Admission:  Basic Metabolic Panel:  Recent Labs Lab 07/19/13 1546 07/23/13 1905  NA 137 128*  K 4.6 4.0  CL 99 90*  CO2 30 27  GLUCOSE 154* 158*  BUN 23 20  CREATININE 1.1 1.09  CALCIUM 9.1 9.4   CBC:  Recent Labs Lab 07/23/13 1905  WBC 8.2  NEUTROABS 6.2  HGB 15.0  HCT 43.2  MCV 92.1  PLT 99*   Radiological Exams on Admission: Dg Chest Portable 1 View  07/23/2013   CLINICAL DATA:  Wheezing and cough.  EXAM: PORTABLE CHEST - 1 VIEW  COMPARISON:  03/13/2013  FINDINGS: Previous median sternotomy and valve replacement.  Moderate cardiomegaly persists. Mild perihilar interstitial infiltrates or edema and central pulmonary vascular congestion as before. New poorly marginated consolidation at the right lung base. No definite effusion. .  IMPRESSION: 1. Cardiomegaly, interstitial edema and vascular congestion as before. 2. New focal airspace opacity at the  right lung base suggesting superimposed pneumonia.   Electronically Signed   By: Oley Balm M.D.   On: 07/23/2013 20:26    EKG: Normal sinus rhythm, no ST/T wave changes  Debbora Presto, MD  [facility] Pager [phone]  If 7PM-7AM, please contact night-coverage [email or web address] Password [email or web address] 07/24/2013, 2:27 AM

## 2013-07-24 NOTE — Progress Notes (Addendum)
Patient admitted this morning. H&P reviewed.  Agree with assessment and plan.  Patient feels slightly better. Not on home oxygen.  Continue current treatment for Bronchitis and CAP. Patient hasn't smoked but husband used to smoke. Has been on Amiodarone for 8 years.  Afib is stable.  Other issues are stable.  Continue to monitor. Will order PT/OT. Per Rn family is interested in SNF.  [facility] 07/24/2013 11:03 AM

## 2013-07-24 NOTE — Evaluation (Signed)
Occupational Therapy Evaluation Patient Details Name: [name] MRN: [id number] DOB: [birth year] Today's Date: 07/24/2013 Time: 4098-1191 OT Time Calculation (min): 26 min  OT Assessment / Plan / Recommendation History of present illness Pt adm with acute respiratory failure due to PNA and COPD.   Clinical Impression   PT admitted with PNA and COPD. Pt currently with functional limitiations due to the deficits listed below (see OT problem list). Daughter falling asleep during session in the middle of conversation. Daughter unable to sustain attention to task of eating yogurt and almost dropping it during session. Question caregivers ability to assist this patient at baseline. Pt will benefit from skilled OT to increase their independence and safety with adls and balance to allow discharge SNF. Pt demonstrates cognitive safety and lack of awareness of deficits during session. Pt is unable to care for herself and high risk for skin break down or infection due to poor hygiene. HIGH RECOMMEND SNF. Daughter is agreeable     OT Assessment  Patient needs continued OT Services    Follow Up Recommendations  SNF;Supervision/Assistance - 24 hour    Barriers to Discharge      Equipment Recommendations  3 in 1 bedside comode;Wheelchair (measurements OT);Wheelchair cushion (measurements OT)    Recommendations for Other Services    Frequency  Min 2X/week    Precautions / Restrictions Precautions Precautions: Fall   Pertinent Vitals/Pain Reports RT knee is better today than before Pt being seen by Dr Cleophas Dunker due to knee pain pta    ADL  Grooming: Wash/dry hands;Wash/dry face;Modified independent Where Assessed - Grooming: Supported sitting Toilet Transfer: Minimal Dentist Method: Sit to Barista: Raised toilet seat with arms (or 3-in-1 over toilet) Toileting - Clothing Manipulation and Hygiene: Maximal assistance Where Assessed - Toileting  Clothing Manipulation and Hygiene: Sit to stand from 3-in-1 or toilet Equipment Used: Gait belt;Rolling walker;Other (comment) (oxygen) Transfers/Ambulation Related to ADLs: Pt ambulated with RW and needed (A) to navigate around objects in walk way. ADL Comments: Pt foudn to have soiled linens in chair and lack of awareness to void. pt voiding bladder in 3n1 with amber colored urine. pt needed (A) for peri care. Pt ambulated to bed and able to complete sit<>supine. Pt requesting towel between her legs and Ot declined. Pt educated on the risk of skin break down and UTI. Pt educated pad in bed placed under buttock but a towel can not be placed in peri area for catching urine.    OT Diagnosis: Generalized weakness;Cognitive deficits  OT Problem List: Decreased strength;Decreased activity tolerance;Impaired balance (sitting and/or standing);Decreased cognition;Decreased safety awareness;Decreased knowledge of use of DME or AE;Decreased knowledge of precautions;Obesity OT Treatment Interventions: Self-care/ADL training;Therapeutic exercise;DME and/or AE instruction;Therapeutic activities;Cognitive remediation/compensation;Patient/family education;Balance training   OT Goals(Current goals can be found in the care plan section) Acute Rehab OT Goals Patient Stated Goal: Return home OT Goal Formulation: With patient Time For Goal Achievement: 08/07/13 Potential to Achieve Goals: Good ADL Goals Pt Will Perform Grooming: with supervision;standing Pt Will Perform Upper Body Bathing: with supervision;standing Pt Will Perform Lower Body Bathing: with supervision;sit to/from stand Pt Will Transfer to Toilet: with supervision;bedside commode  Visit Information  Last OT Received On: 07/24/13 Assistance Needed: +1 History of Present Illness: Pt adm with acute respiratory failure due to PNA and COPD.       Prior Functioning     Home Living Family/patient expects to be discharged to:: Private  residence Living Arrangements: Children Available Help at  Discharge: Available PRN/intermittently Type of Home: House Home Access: Ramped entrance Home Layout: One level Home Equipment: Hand held shower head;Toilet riser;Tub bench;Walker - 4 wheels;Bedside commode;Other (comment) (lift chair) Additional Comments: walker doe snot fit in kitchen or bathroom Prior Function Level of Independence: Needs assistance ADL's / Homemaking Assistance Needed: has home aide 3 days a week.per daughter and patient - pt relies heavily on family assistance for all self care Comments: meals on wheels, Pt reports not oxygen use at home at baseline. Daughter reports pt had a cpap machine and called the company to tell them to come get it. Pt returned the cpap machine. Communication Communication: No difficulties Dominant Hand: Right         Vision/Perception     Cognition  Cognition Arousal/Alertness: Awake/alert Behavior During Therapy: WFL for tasks assessed/performed Overall Cognitive Status: Impaired/Different from baseline Area of Impairment: Problem solving;Awareness;Safety/judgement Memory: Decreased short-term memory Safety/Judgement: Decreased awareness of deficits Awareness: Anticipatory Problem Solving: Slow processing General Comments: Pt reports meeting therapist previously this admission and when corrected "this is my first time meeting you" pt again readdress topic by stating "but you were here last night with me werent you?" pt demonstrates poor hygiene and lack of awareness to deficits. Pt reports being able to do adls but daughter reports that she needs(A), PT agrees with daughter when asked directly.     Extremity/Trunk Assessment Upper Extremity Assessment Upper Extremity Assessment: Overall WFL for tasks assessed Lower Extremity Assessment Lower Extremity Assessment: Defer to PT evaluation     Mobility Bed Mobility Bed Mobility: Sit to Supine Supine to Sit: 4: Min  assist Sitting - Scoot to Edge of Bed: 4: Min assist Sit to Supine: 5: Supervision;HOB flat Details for Bed Mobility Assistance: Assist to bring trunk up. Transfers Transfers: Sit to Stand;Stand to Sit Sit to Stand: 4: Min guard;With upper extremity assist;From chair/3-in-1 (x2 attempts) Stand to Sit: 4: Min guard;With upper extremity assist;To bed Details for Transfer Assistance: required incr time and effort     Exercise     Balance Balance Balance Assessed: Yes Static Standing Balance Static Standing - Balance Support: No upper extremity supported;During functional activity Static Standing - Level of Assistance: 4: Min assist (min guard assist)   End of Session OT - End of Session Activity Tolerance: Patient tolerated treatment well Patient left: in bed;with call bell/phone within reach;with family/visitor present Nurse Communication: Mobility status;Precautions  GO     Harolyn Rutherford 07/24/2013, 4:20 PM Pager: [phone]

## 2013-07-24 NOTE — ED Notes (Addendum)
Pts primary MD  Lamount Cranker MD [address] [place] [address] [facility] [place] [address] [phone]  Daughter Blaine Hamper:  [phone]

## 2013-07-24 NOTE — Progress Notes (Signed)
Clinical Social Work Department CLINICAL SOCIAL WORK PLACEMENT NOTE 07/24/2013  Patient:  MARVELYN, BOUCHILLON  Account Number:  [id number] Admit date:  07/23/2013  Clinical Social Worker:  Sharol Harness, Theresia Majors  Date/time:  07/24/2013 03:20 PM  Clinical Social Work is seeking post-discharge placement for this patient at the following level of care:   SKILLED NURSING   (*CSW will update this form in Epic as items are completed)   07/24/2013  Patient/family provided with [facility] Department of Clinical Social Work's list of facilities offering this level of care within the geographic area requested by the patient (or if unable, by the patient's family).  07/24/2013  Patient/family informed of their freedom to choose among providers that offer the needed level of care, that participate in Medicare, Medicaid or managed care program needed by the patient, have an available bed and are willing to accept the patient.  07/24/2013  Patient/family informed of [facility]' ownership interest in [facility], as well as of the fact that they are under no obligation to receive care at this facility.  PASARR submitted to EDS on  PASARR number received from EDS on  Existing  FL2 transmitted to all facilities in geographic area requested by pt/family on  07/24/2013 FL2 transmitted to all facilities within larger geographic area on   Patient informed that his/her managed care company has contracts with or will negotiate with  certain facilities, including the following:     Patient/family informed of bed offers received:   Patient chooses bed at  Physician recommends and patient chooses bed at    Patient to be transferred to  on   Patient to be transferred to facility by   The following physician request were entered in Epic:   Additional CommentsSharol Harness, LCSWA [phone]

## 2013-07-24 NOTE — ED Provider Notes (Signed)
Care assumed from Dr Preston Fleeting.  Pt with wheezing, cough fore several days, reported fever at home, none here.  Family reports pt is overall weak, and has to be alone during the days.  CXR with possible pna, will cover for CAP with rocephin/zithromax.  Still wheezing after 3rd neb.  D/w hospitalist for admission.   Results for orders placed during the hospital encounter of 07/23/13  PRO B NATRIURETIC PEPTIDE      Result Value Range   Pro B Natriuretic peptide (BNP) 6746.0 (*) 0 - 450 pg/mL  BASIC METABOLIC PANEL      Result Value Range   Sodium 128 (*) 135 - 145 mEq/L   Potassium 4.0  3.5 - 5.1 mEq/L   Chloride 90 (*) 96 - 112 mEq/L   CO2 27  19 - 32 mEq/L   Glucose, Bld 158 (*) 70 - 99 mg/dL   BUN 20  6 - 23 mg/dL   Creatinine, Ser 1.61  0.50 - 1.10 mg/dL   Calcium 9.4  8.4 - 09.6 mg/dL   GFR calc non Af Amer 47 (*) >90 mL/min   GFR calc Af Amer 55 (*) >90 mL/min  CBC WITH DIFFERENTIAL      Result Value Range   WBC 8.2  4.0 - 10.5 K/uL   RBC 4.69  3.87 - 5.11 MIL/uL   Hemoglobin 15.0  12.0 - 15.0 g/dL   HCT 04.5  40.9 - 81.1 %   MCV 92.1  78.0 - 100.0 fL   MCH 32.0  26.0 - 34.0 pg   MCHC 34.7  30.0 - 36.0 g/dL   RDW 91.4  78.2 - 95.6 %   Platelets 99 (*) 150 - 400 K/uL   Neutrophils Relative % 76  43 - 77 %   Neutro Abs 6.2  1.7 - 7.7 K/uL   Lymphocytes Relative 12  12 - 46 %   Lymphs Abs 1.0  0.7 - 4.0 K/uL   Monocytes Relative 12  3 - 12 %   Monocytes Absolute 1.0  0.1 - 1.0 K/uL   Eosinophils Relative 0  0 - 5 %   Eosinophils Absolute 0.0  0.0 - 0.7 K/uL   Basophils Relative 0  0 - 1 %   Basophils Absolute 0.0  0.0 - 0.1 K/uL  PROTIME-INR      Result Value Range   Prothrombin Time 15.4 (*) 11.6 - 15.2 seconds   INR 1.25  0.00 - 1.49  TROPONIN I      Result Value Range   Troponin I <0.30  <0.30 ng/mL  POCT I-STAT TROPONIN I      Result Value Range   Troponin i, poc 0.03  0.00 - 0.08 ng/mL   Comment 3            Dg Chest Portable 1 View  07/23/2013   CLINICAL DATA:   Wheezing and cough.  EXAM: PORTABLE CHEST - 1 VIEW  COMPARISON:  03/13/2013  FINDINGS: Previous median sternotomy and valve replacement. Moderate cardiomegaly persists. Mild perihilar interstitial infiltrates or edema and central pulmonary vascular congestion as before. New poorly marginated consolidation at the right lung base. No definite effusion. .  IMPRESSION: 1. Cardiomegaly, interstitial edema and vascular congestion as before. 2. New focal airspace opacity at the right lung base suggesting superimposed pneumonia.   Electronically Signed   By: Oley Balm M.D.   On: 07/23/2013 20:26   Nm Bone Scan 3 Phase Lower Extremity  07/03/2013   CLINICAL DATA:  Initial encounter for approximate 1 month history of right knee pain and swelling. Prior history of right knee arthroplasty in 1997 and left knee arthroplasty in 2000. No recent trauma. Prior history of right femur fracture with intra medullary nail approximately 7 years ago.  EXAM: NUCLEAR MEDICINE 3-PHASE BONE SCAN  TECHNIQUE: Radionuclide angiographic images, immediate static blood pool images, and 3-hour delayed static images were obtained of the knee joint and the adjacent bones after intravenous injection of radiopharmaceutical.  COMPARISON:  No prior nuclear imaging. Most recent x-rays available for comparison are tibia-fibula x-rays dated 04/25/2011 and 05/02/2011.  RADIOPHARMACEUTICALS:  26.1 mCi Technetium 99 MDP  FINDINGS: Vascular phase: Hyperemia to the left knee.  Blood pool phase: Increased osseous activity involving the knee joint, particularly medially. No convincing soft tissue activity.  Delayed phase: Increased activity involving the right medial femoral condyle adjacent to the femoral component of the knee prosthesis. Expected osseous activity elsewhere in both knees post arthroplasty.  IMPRESSION: Focal prosthetic loosening versus granulomatosis involving the medial femoral component of the right knee prosthesis versus stress fracture  involving the right medial femoral condyle adjacent to the prosthesis. Loosening and/or granulomatosis is favored.   Electronically Signed   By: Hulan Saas M.D.   On: 07/03/2013 12:53      Olivia Mackie, MD 07/24/13 [phone]

## 2013-07-24 NOTE — ED Provider Notes (Signed)
Wide QRS on Monitor shown EKG and previous EKG.  Based on LBBB with concordance in leads 2 and 3 EDP called Dr. Shirlee Latch of cardiology on behalf of Dr. Norlene Campbell who was in a trauma.  Via phone Dr. Shirlee Latch stated LBBB is old and without positive troponin there is no indication for emergent cath in this patient.    Jasmine Awe, MD 07/24/13 [phone]

## 2013-07-24 NOTE — ED Notes (Signed)
Dr Otter at bedside  

## 2013-07-24 NOTE — Evaluation (Signed)
Physical Therapy Evaluation Patient Details Name: [name] MRN: [id number] DOB: 1932-12-29 Today's Date: 07/24/2013 Time: 1140-1209 PT Time Calculation (min): 29 min  PT Assessment / Plan / Recommendation History of Present Illness  Pt adm with acute respiratory failure due to PNA and COPD.  Clinical Impression  Pt admitted with above. Pt currently with functional limitations due to the deficits listed below (see PT Problem List).  Pt will benefit from skilled PT to increase their independence and safety with mobility to allow discharge to the venue listed below. Per nursing family wants pt to go to SNF but pt is adamently refusing.      PT Assessment  Patient needs continued PT services    Follow Up Recommendations  Other (comment) (Pt could benefit from ST-SNF but pt is adamently refusing ST-SNF and plans on returning home. In that case I would maximize [facility] services.)    Does the patient have the potential to tolerate intense rehabilitation      Barriers to Discharge Decreased caregiver support      Equipment Recommendations  None recommended by PT    Recommendations for Other Services     Frequency Min 3X/week    Precautions / Restrictions Precautions Precautions: Fall   Pertinent Vitals/Pain SaO2 93% on RA with amb.      Mobility  Bed Mobility Bed Mobility: Supine to Sit;Sitting - Scoot to Edge of Bed Supine to Sit: 4: Min assist Sitting - Scoot to Edge of Bed: 4: Min assist Details for Bed Mobility Assistance: Assist to bring trunk up. Transfers Transfers: Sit to Stand;Stand to [employer] Transfers Sit to Stand: 4: Min guard;With upper extremity assist;From bed;From chair/3-in-1;With armrests Stand to Sit: 4: Min guard;With upper extremity assist;With armrests;To chair/3-in-1 Stand Pivot Transfers: 4: Min assist;With armrests Details for Transfer Assistance: Assist for balance and safety. Ambulation/Gait Ambulation/Gait Assistance: 4: Min  guard Ambulation Distance (Feet): 10 Feet Assistive device: Rolling walker Ambulation/Gait Assistance Details: Initial cues to stand more erect Gait Pattern: Step-through pattern;Decreased step length - right;Decreased step length - left Gait velocity: slow    Exercises     PT Diagnosis: Difficulty walking;Generalized weakness  PT Problem List: Decreased strength;Decreased activity tolerance;Decreased balance;Decreased mobility;Obesity PT Treatment Interventions: DME instruction;Gait training;Functional mobility training;Therapeutic activities;Therapeutic exercise;Balance training;Patient/family education     PT Goals(Current goals can be found in the care plan section) Acute Rehab PT Goals Patient Stated Goal: Return home PT Goal Formulation: With patient Time For Goal Achievement: 07/31/13 Potential to Achieve Goals: Good  Visit Information  Last PT Received On: 07/24/13 Assistance Needed: +1 History of Present Illness: Pt adm with acute respiratory failure due to PNA and COPD.       Prior Functioning  Home Living Family/patient expects to be discharged to:: Private residence Living Arrangements: Children Available Help at Discharge: Available PRN/intermittently Type of Home: House Home Access: Ramped entrance Home Layout: One level Home Equipment: Hand held shower head;Toilet riser;Tub bench;Walker - 4 wheels;Bedside commode;Other (comment) (lift chair) Prior Function Level of Independence: Needs assistance Comments: meals on wheels Communication Communication: No difficulties Dominant Hand: Right    Cognition  Cognition Arousal/Alertness: Awake/alert Behavior During Therapy: WFL for tasks assessed/performed Overall Cognitive Status: Within Functional Limits for tasks assessed    Extremity/Trunk Assessment Upper Extremity Assessment Upper Extremity Assessment: Defer to OT evaluation Lower Extremity Assessment Lower Extremity Assessment: Generalized weakness    Balance Balance Balance Assessed: Yes Static Standing Balance Static Standing - Balance Support: No upper extremity supported;During functional activity Static  Standing - Level of Assistance: 4: Min assist (min guard assist)  End of Session PT - End of Session Activity Tolerance: Patient limited by fatigue Patient left: in chair;with call bell/phone within reach Nurse Communication: Mobility status  GP     [facility] 07/24/2013, 1:04 PM  [facility] PT [phone]

## 2013-07-24 NOTE — Progress Notes (Addendum)
Clinical Social Work Department BRIEF PSYCHOSOCIAL ASSESSMENT 07/24/2013  Patient:  [name], [name]     Account Number:  [id number]     Admit date:  07/23/2013  Clinical Social Worker:  Harless Nakayama  Date/Time:  07/24/2013 02:30 PM  Referred by:  Physician  Date Referred:  07/24/2013 Referred for  SNF Placement   Other Referral:   Interview type:  Family Other interview type:   Spoke with pt daughter at bedside    PSYCHOSOCIAL DATA Living Status:  WITH ADULT CHILDREN Admitted from facility:   Level of care:   Primary support name:  Blaine Hamper [phone] Primary support relationship to patient:  CHILD, ADULT Degree of support available:   Pt has supportive daughter and son-in-law who she lives with    CURRENT CONCERNS Current Concerns  Post-Acute Placement   Other Concerns:    SOCIAL WORK ASSESSMENT / PLAN CSW aware that PT is recommending SNF and pt is adamently refusing. CSW tried to compelte assessment with pt however pt was sleeping and unable to be aroused at the time. CSW completed assessment with pt daughter who was at bedside. Pt lives with her daughter and son-in-law. CSW informed by pt daughter that they would like for pt to dc to SNF for ST rehab because she is alone for 30 hours a week. Pt currently receives 10 hours of assistance from an aid. CSW inquired as to why pt may not want to go to SNF. Pt daughter reported that pt told a nurse she was previously at a facility and "hated it". Pt daugther said she had never heard this from pt before and was not aware pt had complaints about previous SNF experience. CSW explained that since pt is not currenlty fully oriented CSW could send out referral but would need to speak with pt as well before deciding on any final dc plans. Pt daughter understanding. CSW to make referal and attempt to speak with pt at a later time. Pt daughter requesting [facility] as [facility] choice.  CSW sent clinicals to providerlink to [facility] to begin auth request.   Assessment/plan status:  Psychosocial Support/Ongoing Assessment of Needs Other assessment/ plan:   Information/referral to community resources:   SNF list not needed (Pt daughter familiar with area SNFs).    PATIENT'S/FAMILY'S RESPONSE TO PLAN OF CARE: Pt daughter agreeable to SNF. Unable to assess pt at this time.       Macrina Lehnert, LCSWA [phone]

## 2013-07-25 DIAGNOSIS — D631 Anemia in chronic kidney disease: Secondary | ICD-10-CM

## 2013-07-25 DIAGNOSIS — J189 Pneumonia, unspecified organism: Principal | ICD-10-CM

## 2013-07-25 DIAGNOSIS — J449 Chronic obstructive pulmonary disease, unspecified: Secondary | ICD-10-CM

## 2013-07-25 LAB — CBC
Platelets: 94 10*3/uL — ABNORMAL LOW (ref 150–400)
RDW: 14.6 % (ref 11.5–15.5)
WBC: 9.6 10*3/uL (ref 4.0–10.5)

## 2013-07-25 LAB — BASIC METABOLIC PANEL
Chloride: 93 mEq/L — ABNORMAL LOW (ref 96–112)
Creatinine, Ser: 1.24 mg/dL — ABNORMAL HIGH (ref 0.50–1.10)
GFR calc Af Amer: 47 mL/min — ABNORMAL LOW (ref >90)
Glucose, Bld: 304 mg/dL — ABNORMAL HIGH (ref 70–99)
Potassium: 4.2 mEq/L (ref 3.5–5.1)
Sodium: 129 mEq/L — ABNORMAL LOW (ref 135–145)

## 2013-07-25 LAB — LEGIONELLA ANTIGEN, URINE: Legionella Antigen, Urine: NEGATIVE

## 2013-07-25 LAB — GLUCOSE, CAPILLARY
Glucose-Capillary: 264 mg/dL — ABNORMAL HIGH (ref 70–99)
Glucose-Capillary: 397 mg/dL — ABNORMAL HIGH (ref 70–99)
Glucose-Capillary: 199 mg/dL — ABNORMAL HIGH (ref 70–99)

## 2013-07-25 LAB — EXPECTORATED SPUTUM ASSESSMENT W GRAM STAIN, RFLX TO RESP C

## 2013-07-25 MED ORDER — METHYLPREDNISOLONE SODIUM SUCC 40 MG IJ SOLR
40.0000 mg | Freq: Two times a day (BID) | INTRAMUSCULAR | Status: DC
  Administered 2013-07-25 – 2013-07-26 (×3): 40 mg via INTRAVENOUS
  Filled 2013-07-25 (×4): qty 1

## 2013-07-25 NOTE — Progress Notes (Signed)
Advised daughter that patients lab work was okay.

## 2013-07-25 NOTE — Progress Notes (Signed)
[facility] PROGRESS NOTE  [name] [facility] BJY:[id number] DOB: 04-Oct-1932 DOA: 07/23/2013 PCP: [name], MD  Assessment/Plan:  1-Acute respiratory failure with hypoxia  - most likely secondary to COPD exacerbation and PNA - provide supportive care with oxygen, Change solumedrol to BID. Schedule nebulizer treatments.  - monitor vitals per floor protocol   2-PNA (pneumonia)  - sputum analysis pending. , urine legionella and strep pneumo negative.  - Continue with ceftriaxone and Azithromycin day 2.   3-COPD (chronic obstructive pulmonary disease)  - management with solumedrol, BD's scheduled and as needed  - empiric ABX   4-Diabetes  - check A-1C 6.8, continue home insulin regimen and add SSI   5-DIABETIC PERIPHERAL NEUROPATHY  - continue neurontin   6-Atrial fibrillation  - continue Amiodarone and Xarelto   7-HYPERLIPIDEMIA  - continue statin   8-ANEMIA OF RENAL FAILURE  - Hg and Hct stable and at baseline    9-Chronic diastolic heart failure  - appears to be clinically compensated  - strict I's and O's, daily weights  - continue Lasix, spironolactone  10-MAJOR DEPRESSIVE DISORDER RECURRENT EPISODE MILD  - continue Paxil   11-HYPERTENSION, CONTROLLED  - reasonably stable BP.  12-Hyponatremia: continue with lasix. Corrected sodium by hyperglycemia at 132. 13-CKD, Cr baseline at 1.3. Increase BUN in setting of steroids. Monitor renal function on lasix.    Code Status: full code.  Family Communication: Care discussed with patient.  Disposition Plan: remain inpatient. SNF probably 10-30.    Consultants:  none  Procedures:  none  Antibiotics:  Ceftriaxone 10-28  Azithromycin 10-28  HPI/Subjective: Breathing better. She wants to speak with SW regarding different facilities.  No significant coughing spell.   Objective: Filed Vitals:   07/25/13 0427  BP: 125/59  Pulse: 67  Temp: 97.8 F (36.6 C)  Resp: 20    Intake/Output Summary  (Last 24 hours) at 07/25/13 1015 Last data filed at 07/25/13 0700  Gross per 24 hour  Intake    524 ml  Output   1350 ml  Net   -826 ml   Filed Weights   07/24/13 0355 07/25/13 0205 07/25/13 0427  Weight: 102.286 kg (225 lb 8 oz) 101.288 kg (223 lb 4.8 oz) 101.288 kg (223 lb 4.8 oz)    Exam:   General:  No distress.   Cardiovascular: S 1, S 2 RRR  Respiratory: bilateral crackles, sporadic wheezes.   Abdomen: BS present, soft, NT  Musculoskeletal: trace edema.   Data Reviewed: Basic Metabolic Panel:  Recent Labs Lab 07/19/13 1546 07/23/13 1905 07/24/13 0515 07/25/13 0541  NA 137 128* 130* 129*  K 4.6 4.0 3.4* 4.2  CL 99 90* 91* 93*  CO2 30 27 23 24   GLUCOSE 154* 158* 267* 304*  BUN 23 20 20  38*  CREATININE 1.1 1.09 1.05 1.24*  CALCIUM 9.1 9.4 9.0 9.2   Liver Function Tests: No results found for this basename: AST, ALT, ALKPHOS, BILITOT, PROT, ALBUMIN,  in the last 168 hours No results found for this basename: LIPASE, AMYLASE,  in the last 168 hours No results found for this basename: AMMONIA,  in the last 168 hours CBC:  Recent Labs Lab 07/23/13 1905 07/24/13 0515 07/25/13 0541  WBC 8.2 7.8 9.6  NEUTROABS 6.2  --   --   HGB 15.0 14.5 13.4  HCT 43.2 42.6 39.5  MCV 92.1 89.9 88.8  PLT 99* 91* 94*   Cardiac Enzymes:  Recent Labs Lab 07/24/13 0214  TROPONINI <0.30   BNP (last  3 results)  Recent Labs  02/04/13 1050 03/13/13 1345 07/23/13 1905  PROBNP 23347.0* 6455.0* 6746.0*   CBG:  Recent Labs Lab 07/24/13 0746 07/24/13 1147 07/24/13 1648 07/24/13 2031 07/25/13 0805  GLUCAP 273* 321* 238* 264* 281*    Recent Results (from the past 240 hour(s))  MRSA PCR SCREENING     Status: None   Collection Time    07/24/13  4:32 AM      Result Value Range Status   MRSA by PCR NEGATIVE  NEGATIVE Final   Comment:            The GeneXpert MRSA Assay (FDA     approved for NASAL specimens     only), is one component of a     comprehensive  MRSA colonization     surveillance program. It is not     intended to diagnose MRSA     infection nor to guide or     monitor treatment for     MRSA infections.  CULTURE, BLOOD (ROUTINE X 2)     Status: None   Collection Time    07/24/13  5:15 AM      Result Value Range Status   Specimen Description BLOOD LEFT ARM   Final   Special Requests BOTTLES DRAWN AEROBIC AND ANAEROBIC 10CC EACH   Final   Culture  Setup Time     Final   Value: 07/24/2013 10:08     Performed at [facility]   Culture     Final   Value:        BLOOD CULTURE RECEIVED NO GROWTH TO DATE CULTURE WILL BE HELD FOR 5 DAYS BEFORE ISSUING A FINAL NEGATIVE REPORT     Performed at [facility]   Report Status PENDING   Incomplete  CULTURE, BLOOD (ROUTINE X 2)     Status: None   Collection Time    07/24/13  5:25 AM      Result Value Range Status   Specimen Description BLOOD LEFT HAND   Final   Special Requests BOTTLES DRAWN AEROBIC ONLY 10CC   Final   Culture  Setup Time     Final   Value: 07/24/2013 10:08     Performed at [facility]   Culture     Final   Value:        BLOOD CULTURE RECEIVED NO GROWTH TO DATE CULTURE WILL BE HELD FOR 5 DAYS BEFORE ISSUING A FINAL NEGATIVE REPORT     Performed at [facility]   Report Status PENDING   Incomplete  CULTURE, EXPECTORATED SPUTUM-ASSESSMENT     Status: None   Collection Time    07/25/13 12:49 AM      Result Value Range Status   Specimen Description SPUTUM   Final   Special Requests NONE   Final   Sputum evaluation     Final   Value: THIS SPECIMEN IS ACCEPTABLE. RESPIRATORY CULTURE REPORT TO FOLLOW.   Report Status 07/25/2013 FINAL   Final     Studies: Dg Chest Portable 1 View  07/23/2013   CLINICAL DATA:  Wheezing and cough.  EXAM: PORTABLE CHEST - 1 VIEW  COMPARISON:  03/13/2013  FINDINGS: Previous median sternotomy and valve replacement. Moderate cardiomegaly persists. Mild perihilar interstitial infiltrates or edema and central  pulmonary vascular congestion as before. New poorly marginated consolidation at the right lung base. No definite effusion. .  IMPRESSION: 1. Cardiomegaly, interstitial edema and vascular congestion as before. 2.  New focal airspace opacity at the right lung base suggesting superimposed pneumonia.   Electronically Signed   By: Oley Balm M.D.   On: 07/23/2013 20:26    Scheduled Meds: . albuterol  2.5 mg Nebulization Q6H  . amiodarone  100 mg Oral BID  . aspirin EC  81 mg Oral Daily  . azithromycin  500 mg Oral Q24H  . cefTRIAXone (ROCEPHIN)  IV  1 g Intravenous Q24H  . donepezil  10 mg Oral QHS  . ferrous sulfate  325 mg Oral BID  . fluticasone  2 puff Inhalation BID  . furosemide  40 mg Oral BID  . gabapentin  300 mg Oral Daily  . gabapentin  600 mg Oral QHS  . insulin aspart  0-9 Units Subcutaneous TID WC  . insulin glargine  24 Units Subcutaneous QPM  . methylPREDNISolone (SOLU-MEDROL) injection  40 mg Intravenous Q12H  . metoprolol  50 mg Oral BID  . PARoxetine  40 mg Oral Daily  . potassium chloride  40 mEq Oral BID  . Rivaroxaban  15 mg Oral Q supper  . simvastatin  10 mg Oral QHS  . sodium chloride  3 mL Intravenous Q12H  . spironolactone  12.5 mg Oral BID   Continuous Infusions:   Principal Problem:   Acute respiratory failure with hypoxia Active Problems:   DIABETIC PERIPHERAL NEUROPATHY   HYPERLIPIDEMIA   ANEMIA OF RENAL FAILURE   MAJOR DEPRESSIVE DISORDER RECURRENT EPISODE MILD   HYPERTENSION, CONTROLLED   Chronic diastolic heart failure   COPD (chronic obstructive pulmonary disease)   CAP (community acquired pneumonia)    Time spent: 35 minutes.     Jaycelyn Orrison  [facility] Pager [phone]. If 7PM-7AM, please contact night-coverage at [email or web address], password [facility] 07/25/2013, 10:15 AM  LOS: 2 days

## 2013-07-25 NOTE — Progress Notes (Addendum)
CSW Proofreader) spoke with pt at bedside. Pt was very upset about possibility of going to SNF. CSW along with pt nurse explained why that recommendation is being made and why SNF would benefit pt. Pt was understanding. CSW spoke with pt about pt reported issues and home and negative feelings of family members concerning pt aging. CSW offered support. Pt is agreeable to SNF at this time and is requesting [facility] as one of her daughter's works there. Pt is also requesting a roommate as she did not enjoy being "put in a room alone last time". CSW has acknowledged pt requests and will relay to facility.   CSW continues to work with pt insurance to get authorization for SNF.   Addendum: CSW spoke with pt daughter who works at [employer] and pt daughter does not want pt to go to that facility. Pt daughter did discuss this with pt as well and reported that pt is understanding. CSW has left voicemail for pt daughter, [name], who is POA to discuss and give bed offers. CSW has also called and left voicemail with [facility] (pt and pt family current first choice) to inquire about bed offer.  Jocelyn Lowery, LCSWA [phone]

## 2013-07-25 NOTE — Progress Notes (Addendum)
Inpatient Diabetes Program Recommendations  AACE/ADA: New Consensus Statement on Inpatient Glycemic Control (2013)  Target Ranges:  Prepandial:   less than 140 mg/dL      Peak postprandial:   less than 180 mg/dL (1-2 hours)      Critically ill patients:  140 - 180 mg/dL     Results for SHAKEYA, KERKMAN (MRN [id number]) as of 07/25/2013 14:34  Ref. Range 07/24/2013 07:46 07/24/2013 11:47 07/24/2013 16:48 07/24/2013 20:31  Glucose-Capillary Latest Range: 70-99 mg/dL 409 (H) 811 (H) 914 (H) 264 (H)    Results for MAUDINE, KLUESNER (MRN [id number]) as of 07/25/2013 14:34  Ref. Range 07/25/2013 08:05 07/25/2013 11:53  Glucose-Capillary Latest Range: 70-99 mg/dL 086 (H) 578 (H)    **Patient on IV steroids.  Having sustained hyperglycemia.  **MD- Please consider the following in-hospital insulin adjustments:  1. Increase Lantus to 30 units QPM 2. Add Novolog meal coverage- Novolog 4 units tid with meals   Will follow. Ambrose Finland RN, MSN, CDE Diabetes Coordinator Inpatient Diabetes Program Team Pager: [phone] (8a-10p)

## 2013-07-25 NOTE — Evaluation (Signed)
Physical Therapy Evaluation Patient Details Name: [name] MRN: [id number] DOB: 05/10/1933 Today's Date: 07/25/2013 Time: 5784-6962 PT Time Calculation (min): 24 min  PT Assessment / Plan / Recommendation History of Present Illness  Pt adm with acute respiratory failure due to PNA and COPD.  Clinical Impression  Pt making slow progress.  Continue to feel [facility]-SNF is best option.    PT Assessment       Follow Up Recommendations  SNF    Does the patient have the potential to tolerate intense rehabilitation      Barriers to Discharge        Equipment Recommendations  None recommended by PT    Recommendations for Other Services     Frequency Min 3X/week    Precautions / Restrictions Precautions Precautions: Fall   Pertinent Vitals/Pain See flow sheet.      Mobility  Transfers Sit to Stand: 4: Min assist;With upper extremity assist;With armrests;From chair/3-in-1 Stand to Sit: 4: Min assist;With upper extremity assist;With armrests;To chair/3-in-1 Details for Transfer Assistance: Assist to bring hips up. Ambulation/Gait Ambulation/Gait Assistance: 4: Min assist Ambulation Distance (Feet): 70 Feet Assistive device: Rolling walker Ambulation/Gait Assistance Details: Cues to stay closer to walker and to stand more erect. Gait Pattern: Step-through pattern;Decreased step length - right;Decreased step length - left;Trunk flexed General Gait Details: Rt knee tends to give some with stance    Exercises     PT Diagnosis:    PT Problem List:   PT Treatment Interventions:       PT Goals(Current goals can be found in the care plan section)    Visit Information  Last PT Received On: 07/25/13 Assistance Needed: +1 History of Present Illness: Pt adm with acute respiratory failure due to PNA and COPD.       Prior Functioning       Cognition  Cognition Arousal/Alertness: Awake/alert Behavior During Therapy: WFL for tasks assessed/performed Memory: Decreased  short-term memory Safety/Judgement: Decreased awareness of deficits Problem Solving: Slow processing    Extremity/Trunk Assessment     Balance Static Standing Balance Static Standing - Balance Support: Bilateral upper extremity supported Static Standing - Level of Assistance: 5: Stand by assistance  End of Session PT - End of Session Equipment Utilized During Treatment: Gait belt Activity Tolerance: Patient tolerated treatment well Patient left: in chair;with call bell/phone within reach Nurse Communication: Mobility status  GP     [name] 07/25/2013, 3:36 PM  [employer] PT [phone]

## 2013-07-26 LAB — GLUCOSE, CAPILLARY
Glucose-Capillary: 250 mg/dL — ABNORMAL HIGH (ref 70–99)
Glucose-Capillary: 409 mg/dL — ABNORMAL HIGH (ref 70–99)

## 2013-07-26 MED ORDER — POLYETHYLENE GLYCOL 3350 17 G PO PACK: 17.0000 g | PACK | Freq: Every day | ORAL | Status: DC | PRN

## 2013-07-26 MED ORDER — ALBUTEROL SULFATE HFA 108 (90 BASE) MCG/ACT IN AERS: 2.0000 | INHALATION_SPRAY | Freq: Four times a day (QID) | RESPIRATORY_TRACT | Status: AC | PRN

## 2013-07-26 MED ORDER — LEVOFLOXACIN 500 MG PO TABS: 500.0000 mg | ORAL_TABLET | Freq: Every day | ORAL | Status: DC

## 2013-07-26 MED ORDER — POTASSIUM CHLORIDE CRYS ER 20 MEQ PO TBCR: 40.0000 meq | EXTENDED_RELEASE_TABLET | Freq: Every day | ORAL | Status: DC

## 2013-07-26 MED ORDER — PREDNISONE 10 MG PO TABS: ORAL_TABLET | ORAL | Status: DC

## 2013-07-26 NOTE — Discharge Summary (Signed)
Physician Discharge Summary  [name] [facility] ZOX:[id number] DOB: September 21, 1933 DOA: 07/23/2013  PCP: [name], MD  Admit date: 07/23/2013 Discharge date: 07/26/2013  Time spent: 35 minutes  Recommendations for Outpatient Follow-up:  1. Need bmet to follow sodium level and renal function. 2. Please follow up sputum culture results.  3.  Discharge Diagnoses:    Acute respiratory failure with hypoxia   PNA   COPD excacerbation   DIABETIC PERIPHERAL NEUROPATHY   HYPERLIPIDEMIA   ANEMIA OF RENAL FAILURE   MAJOR DEPRESSIVE DISORDER RECURRENT EPISODE MILD   HYPERTENSION, CONTROLLED   Chronic diastolic heart failure   COPD (chronic obstructive pulmonary disease)   CAP (community acquired pneumonia)   Discharge Condition: Stable  Diet recommendation: Heart Healthy  Filed Weights   07/25/13 0427 07/26/13 0225 07/26/13 0429  Weight: 101.288 kg (223 lb 4.8 oz) 102.059 kg (225 lb) 107.412 kg (236 lb 12.8 oz)    History of present illness:  Pt is 79 yo female with complicated and complex medical history who preened to [facility] ED with main concern of progressively worsening shortness of breath that initially started with exertion and now present at rest, associated with wheezing, subjective fevers, chills, cough productive of yellow sputum. This started several days prior tot his admission and has not been getting better. NO specific alleviating or aggravating factors, no similar events in the past. She denies chest pain, no N/V/D, no neurological symptoms.  TRH asked to admit for further management of COPD and ? PNA. Pt started on Zithromax and Rocephin in ED.   Hospital Course:  1-Acute respiratory failure with hypoxia  -Resolved, patient oxygen saturation in 95 on room air.  - most likely secondary to COPD exacerbation and PNA  - provide supportive care with oxygen, Change solumedrol to prednisone at discharge. Schedule nebulizer treatments.  - monitor vitals per floor protocol   -Patient refuse SNF.  2-PNA (pneumonia)  - sputum analysis pending. , urine legionella and strep pneumo negative.  - Continue with ceftriaxone and Azithromycin day 3. Will discharge on Levaquin for 6 more days.Marland Kitchen  3-COPD (chronic obstructive pulmonary disease)  - management with solumedrol, BD's scheduled and as needed  - empiric ABX  4-Diabetes  - check A-1C 6.8, continue home insulin regimen and add SSI  5-DIABETIC PERIPHERAL NEUROPATHY  - continue neurontin  6-Atrial fibrillation  - continue Amiodarone and Xarelto  7-HYPERLIPIDEMIA  - continue statin  8-ANEMIA OF RENAL FAILURE  - Hg and Hct stable and at baseline  9-Chronic diastolic heart failure  - appears to be clinically compensated  - strict I's and O's, daily weights  - continue Lasix, spironolactone  10-MAJOR DEPRESSIVE DISORDER RECURRENT EPISODE MILD  - continue Paxil  11-HYPERTENSION, CONTROLLED  - reasonably stable BP.  12-Hyponatremia: continue with lasix. Corrected sodium by hyperglycemia at 132.  13-CKD, Cr baseline at 1.3. Increase BUN in setting of steroids. Monitor renal function on lasix. Need Bmet when follow with PCP.    Procedures:  none  Consultations:  none  Discharge Exam: Filed Vitals:   07/26/13 0429  BP: 118/53  Pulse: 67  Temp: 98.1 F (36.7 C)  Resp: 18    General: no distress.  Cardiovascular: S 1, S 2 RRR Respiratory: CTA  Discharge Instructions  Discharge Orders   Future Appointments Provider Department Dept Phone   08/20/2013 2:45 PM Donato Schultz, MD [facility] [phone]   Future Orders Complete By Expires   Diet - low sodium heart healthy  As directed  Increase activity slowly  As directed        Medication List    STOP taking these medications       metolazone 2.5 MG tablet  Commonly known as:  ZAROXOLYN      TAKE these medications       acetaminophen 325 MG tablet  Commonly known as:  TYLENOL  Take 162.5 mg by mouth every 6 (six)  hours as needed. For pain     albuterol 108 (90 BASE) MCG/ACT inhaler  Commonly known as:  PROVENTIL HFA;VENTOLIN HFA  Inhale 2 puffs into the lungs every 6 (six) hours as needed for wheezing.     amiodarone 100 MG tablet  Commonly known as:  PACERONE  Take 1 tablet (100 mg total) by mouth 2 (two) times daily.     aspirin EC 81 MG tablet  Take 1 tablet (81 mg total) by mouth daily.     diclofenac sodium 1 % Gel  Commonly known as:  VOLTAREN  Apply topically.     diphenhydrAMINE-zinc acetate cream  Commonly known as:  BENADRYL  Apply 1 application topically 2 (two) times daily as needed. For itching     docusate sodium 100 MG capsule  Commonly known as:  COLACE  Take 1 capsule (100 mg total) by mouth 2 (two) times daily as needed. For constipation     donepezil 10 MG tablet  Commonly known as:  ARICEPT  Take 1 tablet (10 mg total) by mouth at bedtime.     ferrous sulfate 325 (65 FE) MG tablet  Take 1 tablet (325 mg total) by mouth 2 (two) times daily.     fish oil-omega-3 fatty acids 1000 MG capsule  Take 1 g by mouth daily.     furosemide 20 MG tablet  Commonly known as:  LASIX  Take 2 tablets (40 mg total) by mouth 2 (two) times daily.     gabapentin 600 MG tablet  Commonly known as:  NEURONTIN  Take 300-600 mg by mouth 2 (two) times daily. Take 1/2 tab in the morning and 1 tab at bedtime     insulin glargine 100 UNIT/ML injection  Commonly known as:  LANTUS  Inject 24 Units into the skin every evening.     lactobacillus acidophilus Tabs tablet  Take 2 tablets by mouth 2 (two) times daily.     levofloxacin 500 MG tablet  Commonly known as:  LEVAQUIN  Take 1 tablet (500 mg total) by mouth daily.     metoprolol 50 MG tablet  Commonly known as:  LOPRESSOR  Take 1 tablet (50 mg total) by mouth 2 (two) times daily.     multivitamin tablet  Take 1 tablet by mouth daily.     PARoxetine 40 MG tablet  Commonly known as:  PAXIL  Take 1 tablet (40 mg total) by  mouth every morning.     polyethylene glycol packet  Commonly known as:  MIRALAX / GLYCOLAX  Take 17 g by mouth daily as needed.     potassium chloride SA 20 MEQ tablet  Commonly known as:  K-DUR,KLOR-CON  Take 2 tablets (40 mEq total) by mouth daily.     predniSONE 10 MG tablet  Commonly known as:  DELTASONE  Take 2 tablet for 2 days then one tablet for one day, then stop.     PRESERVISION AREDS PO  Take 1 capsule by mouth daily.     Rivaroxaban 15 MG Tabs tablet  Commonly known as:  XARELTO  Take 1 tablet (15 mg total) by mouth daily with supper.     Senna 15 MG Tabs  Take 1 tablet by mouth daily as needed. For constipation     simvastatin 10 MG tablet  Commonly known as:  ZOCOR  Take 1 tablet (10 mg total) by mouth at bedtime.     spironolactone 12.5 mg Tabs tablet  Commonly known as:  ALDACTONE  Take 0.5 tablets (12.5 mg total) by mouth 2 (two) times daily.       Allergies  Allergen Reactions  . Morphine And Related Other (See Comments)    "went crazy"-Hallucinations  . Celecoxib Other (See Comments)    Sore mouth  . Codeine   . Oxycodone   . Wellbutrin [Bupropion] Other (See Comments)    Hallucinations  . Zyrtec [Cetirizine] Rash       Follow-up Information   Follow up with [name], MD.   Specialty:  Internal Medicine   Contact information:   [address] [address] [place] [address] [phone]       Please follow up. (you need lab work in 3 days (B-met))        The results of significant diagnostics from this hospitalization (including imaging, microbiology, ancillary and laboratory) are listed below for reference.    Significant Diagnostic Studies: Dg Chest Portable 1 View  07/23/2013   CLINICAL DATA:  Wheezing and cough.  EXAM: PORTABLE CHEST - 1 VIEW  COMPARISON:  03/13/2013  FINDINGS: Previous median sternotomy and valve replacement. Moderate cardiomegaly persists. Mild perihilar interstitial infiltrates or edema and  central pulmonary vascular congestion as before. New poorly marginated consolidation at the right lung base. No definite effusion. .  IMPRESSION: 1. Cardiomegaly, interstitial edema and vascular congestion as before. 2. New focal airspace opacity at the right lung base suggesting superimposed pneumonia.   Electronically Signed   By: Oley Balm M.D.   On: 07/23/2013 20:26   Nm Bone Scan 3 Phase Lower Extremity  07/03/2013   CLINICAL DATA:  Initial encounter for approximate 1 month history of right knee pain and swelling. Prior history of right knee arthroplasty in 1997 and left knee arthroplasty in 2000. No recent trauma. Prior history of right femur fracture with intra medullary nail approximately 7 years ago.  EXAM: NUCLEAR MEDICINE 3-PHASE BONE SCAN  TECHNIQUE: Radionuclide angiographic images, immediate static blood pool images, and 3-hour delayed static images were obtained of the knee joint and the adjacent bones after intravenous injection of radiopharmaceutical.  COMPARISON:  No prior nuclear imaging. Most recent x-rays available for comparison are tibia-fibula x-rays dated 04/25/2011 and 05/02/2011.  RADIOPHARMACEUTICALS:  26.1 mCi Technetium 99 MDP  FINDINGS: Vascular phase: Hyperemia to the left knee.  Blood pool phase: Increased osseous activity involving the knee joint, particularly medially. No convincing soft tissue activity.  Delayed phase: Increased activity involving the right medial femoral condyle adjacent to the femoral component of the knee prosthesis. Expected osseous activity elsewhere in both knees post arthroplasty.  IMPRESSION: Focal prosthetic loosening versus granulomatosis involving the medial femoral component of the right knee prosthesis versus stress fracture involving the right medial femoral condyle adjacent to the prosthesis. Loosening and/or granulomatosis is favored.   Electronically Signed   By: Hulan Saas M.D.   On: 07/03/2013 12:53    Microbiology: Recent  Results (from the past 240 hour(s))  MRSA PCR SCREENING     Status: None   Collection Time    07/24/13  4:32 AM  Result Value Range Status   MRSA by PCR NEGATIVE  NEGATIVE Final   Comment:            The GeneXpert MRSA Assay (FDA     approved for NASAL specimens     only), is one component of a     comprehensive MRSA colonization     surveillance program. It is not     intended to diagnose MRSA     infection nor to guide or     monitor treatment for     MRSA infections.  CULTURE, BLOOD (ROUTINE X 2)     Status: None   Collection Time    07/24/13  5:15 AM      Result Value Range Status   Specimen Description BLOOD LEFT ARM   Final   Special Requests BOTTLES DRAWN AEROBIC AND ANAEROBIC 10CC EACH   Final   Culture  Setup Time     Final   Value: 07/24/2013 10:08     Performed at [facility]   Culture     Final   Value:        BLOOD CULTURE RECEIVED NO GROWTH TO DATE CULTURE WILL BE HELD FOR 5 DAYS BEFORE ISSUING A FINAL NEGATIVE REPORT     Performed at [facility]   Report Status PENDING   Incomplete  CULTURE, BLOOD (ROUTINE X 2)     Status: None   Collection Time    07/24/13  5:25 AM      Result Value Range Status   Specimen Description BLOOD LEFT HAND   Final   Special Requests BOTTLES DRAWN AEROBIC ONLY 10CC   Final   Culture  Setup Time     Final   Value: 07/24/2013 10:08     Performed at [facility]   Culture     Final   Value:        BLOOD CULTURE RECEIVED NO GROWTH TO DATE CULTURE WILL BE HELD FOR 5 DAYS BEFORE ISSUING A FINAL NEGATIVE REPORT     Performed at [facility]   Report Status PENDING   Incomplete  CULTURE, EXPECTORATED SPUTUM-ASSESSMENT     Status: None   Collection Time    07/25/13 12:49 AM      Result Value Range Status   Specimen Description SPUTUM   Final   Special Requests NONE   Final   Sputum evaluation     Final   Value: THIS SPECIMEN IS ACCEPTABLE. RESPIRATORY CULTURE REPORT TO FOLLOW.   Report Status  07/25/2013 FINAL   Final  CULTURE, RESPIRATORY (NON-EXPECTORATED)     Status: None   Collection Time    07/25/13 12:49 AM      Result Value Range Status   Specimen Description SPUTUM   Final   Special Requests NONE   Final   Gram Stain     Final   Value: ABUNDANT WBC PRESENT,BOTH PMN AND MONONUCLEAR     RARE SQUAMOUS EPITHELIAL CELLS PRESENT     MODERATE GRAM POSITIVE COCCI IN PAIRS     FEW GRAM NEGATIVE RODS     Performed at [facility]   Culture     Final   Value: Culture reincubated for better growth     Performed at [facility]   Report Status PENDING   Incomplete     Labs: Basic Metabolic Panel:  Recent Labs Lab 07/19/13 1546 07/23/13 1905 07/24/13 0515 07/25/13 0541  NA 137 128* 130* 129*  K 4.6 4.0 3.4* 4.2  CL 99 90* 91* 93*  CO2 30 27 23 24   GLUCOSE 154* 158* 267* 304*  BUN 23 20 20  38*  CREATININE 1.1 1.09 1.05 1.24*  CALCIUM 9.1 9.4 9.0 9.2   Liver Function Tests: No results found for this basename: AST, ALT, ALKPHOS, BILITOT, PROT, ALBUMIN,  in the last 168 hours No results found for this basename: LIPASE, AMYLASE,  in the last 168 hours No results found for this basename: AMMONIA,  in the last 168 hours CBC:  Recent Labs Lab 07/23/13 1905 07/24/13 0515 07/25/13 0541  WBC 8.2 7.8 9.6  NEUTROABS 6.2  --   --   HGB 15.0 14.5 13.4  HCT 43.2 42.6 39.5  MCV 92.1 89.9 88.8  PLT 99* 91* 94*   Cardiac Enzymes:  Recent Labs Lab 07/24/13 0214  TROPONINI <0.30   BNP: BNP (last 3 results)  Recent Labs  02/04/13 1050 03/13/13 1345 07/23/13 1905  PROBNP 23347.0* 6455.0* 6746.0*   CBG:  Recent Labs Lab 07/25/13 0805 07/25/13 1153 07/25/13 1542 07/25/13 2044 07/26/13 0743  GLUCAP 281* 397* 199* 246* 250*       Signed:  REGALADO,BELKYS  [facility] 07/26/2013, 11:15 AM

## 2013-07-26 NOTE — Care Management Note (Signed)
    Page 1 of 2   07/26/2013     11:01:22 AM   CARE MANAGEMENT NOTE 07/26/2013  Patient:  [name], [name]   Account Number:  [id number]  Date Initiated:  07/26/2013  Documentation initiated by:  GRAVES-BIGELOW,Jersi Mcmaster  Subjective/Objective Assessment:   Pt admitted for COPD exacerbation.     Action/Plan:   Plan will be for home with [facility] services since pt is refusing SNF at this time.   Anticipated DC Date:  07/26/2013   Anticipated DC Plan:  HOME W HOME HEALTH SERVICES  In-house referral  Clinical Social Worker      DC Associate Professor  CM consult      [facility] Choice  HOME HEALTH  DURABLE MEDICAL EQUIPMENT   Choice offered to / List presented to:  C-1 Patient   DME arranged  HOSPITAL BED      DME agency  [facility]     [facility] arranged  HH-1 RN  HH-10 DISEASE MANAGEMENT  HH-2 PT  HH-3 OT  HH-6 SOCIAL WORKER      HH agency  [facility]   Status of service:  Completed, signed off Medicare Important Message given?   (If response is "NO", the following Medicare IM given date fields will be blank) Date Medicare IM given:   Date Additional Medicare IM given:    Discharge Disposition:  HOME W HOME HEALTH SERVICES  Per UR Regulation:  Reviewed for med. necessity/level of care/duration of stay  If discussed at Long Length of Stay Meetings, dates discussed:    Comments:  07-26-13 [address] [place], [place] [phone] CSW [place] spoke to pt and pt is refusing SNF at this time. Pt would like to go home with [facility] for services. CM did make referral for services and SOC to begin within 24-48 hours post d/c. Hospital bed to be ordered via [facility] and delivered to home. No further needs from CM at this time.

## 2013-07-26 NOTE — Progress Notes (Signed)
Inpatient Diabetes Program Recommendations  AACE/ADA: New Consensus Statement on Inpatient Glycemic Control (2013)  Target Ranges:  Prepandial:   less than 140 mg/dL      Peak postprandial:   less than 180 mg/dL (1-2 hours)      Critically ill patients:  140 - 180 mg/dL   Results for [name], [name] (MRN [id number]) as of 07/26/2013 11:59  Ref. Range 07/25/2013 08:05 07/25/2013 11:53 07/25/2013 15:42 07/25/2013 20:44 07/26/2013 07:43 07/26/2013 11:43  Glucose-Capillary Latest Range: 70-99 mg/dL 409 (H) 811 (H) 914 (H) 246 (H) 250 (H) 409 (H)    Inpatient Diabetes Program Recommendations Insulin - Basal: While inpatient, please consider increasing Lantus to 30 units QPM. Insulin - Meal Coverage: While inpatient and on steriods, please consider ordering Novolog 4 units TID with meals for meal coverage.  Note: Fasting glucose over the past 2 days has been greater than 250 mg/dl and post prandial glucose consistently elevated (likely due to steroids).  While inpatient and on steroids, please consider increasing Lantus to 30 units QPM and ordering Novolog 4 units TID with meals for meal coverage. Will continue to follow while inpatient.  Thanks, Orlando Penner, RN, MSN, CCRN Diabetes Coordinator Inpatient Diabetes Program [phone] (Team Pager) [phone] ([facility] office) [phone] [facility] office)

## 2013-07-26 NOTE — Progress Notes (Signed)
Occupational Therapy Treatment Patient Details Name: [name] MRN: [id number] DOB: Jul 20, 1933 Today's Date: 07/26/2013 Time: 0821-0850 OT Time Calculation (min): 29 min  OT Assessment / Plan / Recommendation  History of present illness Pt adm with acute respiratory failure due to PNA and COPD.   OT comments  Pt unaware of deficits & need for assist during ADL's. Pt verbalizes decreased family support at home (family members working, son in law not speaking to her, won't eat her food etc) & cont to progress slowly. Cont to recommend SNF rehab.   Follow Up Recommendations  SNF;Supervision/Assistance - 24 hour    Barriers to Discharge       Equipment Recommendations  3 in 1 bedside comode;Wheelchair (measurements OT);Wheelchair cushion (measurements OT)    Recommendations for Other Services    Frequency Min 2X/week   Progress towards OT Goals Progress towards OT goals: Progressing toward goals  Plan Discharge plan remains appropriate    Precautions / Restrictions Precautions Precautions: Fall   Pertinent Vitals/Pain No c/o    ADL  Eating/Feeding: Performed;Modified independent Where Assessed - Eating/Feeding: Chair Grooming: Performed;Wash/dry hands;Wash/dry face;Teeth care;Min guard Where Assessed - Grooming: Supported standing Toilet Transfer: Performed;Min Pension scheme manager Method: Sit to Barista: Raised toilet seat with arms (or 3-in-1 over toilet) Toileting - Clothing Manipulation and Hygiene: Performed;Moderate assistance (Mod A for peri care. Pt states that she voided in noc as well as in chair, unaware.) Where Assessed - Toileting Clothing Manipulation and Hygiene: Sit to stand from 3-in-1 or toilet;Standing Tub/Shower Transfer Method: Not assessed Equipment Used: Rolling walker Transfers/Ambulation Related to ADLs: Pt ambulated with RW and needed (A) to navigate around objects in walk way/room. VC's for safety w/ RW. ADL Comments: Pt  participated in ADL's this am for toileting/transfers and standing at sink for grooming. Pt unaware of deficits & need for assist during ADL's. Pt verbalizes decreased family support at home (family members working, son in law not speaking to her, won't eat her food etc) & cont to progress slowly. Cont to recommend SNF rehab.    OT Diagnosis:    OT Problem List:   OT Treatment Interventions:     OT Goals(current goals can now be found in the care plan section) Acute Rehab OT Goals Patient Stated Goal: Return home OT Goal Formulation: With patient Time For Goal Achievement: 08/07/13 Potential to Achieve Goals: Good  Visit Information  Last OT Received On: 07/26/13 Assistance Needed: +1 History of Present Illness: Pt adm with acute respiratory failure due to PNA and COPD.    Subjective Data      Prior Functioning       Cognition  Cognition Arousal/Alertness: Awake/alert Behavior During Therapy: WFL for tasks assessed/performed Area of Impairment: Problem solving;Awareness;Safety/judgement Memory: Decreased short-term memory Safety/Judgement: Decreased awareness of deficits Problem Solving: Slow processing General Comments: Pt demonstrates poor hygeine and lack of awareness of deficits, decreased safety awareness. Pt needs assist for ADL's however reports independence.    Mobility  Bed Mobility Bed Mobility: Not assessed (Pt up in chair secondary to voiding in bed earlier) Transfers Transfers: Sit to Stand;Stand to Sit Sit to Stand: 4: Min assist;With upper extremity assist;With armrests;From chair/3-in-1 Stand to Sit: 4: Min assist;With upper extremity assist;With armrests;To chair/3-in-1;Other (comment) (VC's for RW safety & hand placement)         Balance Balance Balance Assessed: Yes Static Standing Balance Static Standing - Balance Support: Bilateral upper extremity supported Static Standing - Level of Assistance: 5: Stand  by assistance   End of Session OT - End of  Session Equipment Utilized During Treatment: Rolling walker;Other (comment) (3:1 over toilet in bathroom) Activity Tolerance: Patient tolerated treatment well Patient left: in chair;with call bell/phone within reach;with nursing/sitter in room  GO     [name] 07/26/2013, 9:01 AM

## 2013-07-26 NOTE — Progress Notes (Signed)
CSW (Clinical Child psychotherapist) received voicemail from pt daughter [name] stating that the decision will be the pt's about whether she would like to return home or go to SNF. Pt daughter is okay with either decision. CSW spoke with pt and pt would like to return home with home health services from [facility] as she has worked with them in the past. Pt also requested a hospital bed. CSW informed case Production designer, theatre/television/film, nurse, and MD. CSW signing off.  [name], LCSWA [phone]

## 2013-07-27 LAB — CULTURE, RESPIRATORY W GRAM STAIN: Culture: NORMAL

## 2013-07-27 LAB — CULTURE, RESPIRATORY

## 2013-07-30 LAB — CULTURE, BLOOD (ROUTINE X 2): Culture: NO GROWTH

## 2013-08-03 ENCOUNTER — Other Ambulatory Visit: Payer: Self-pay

## 2013-08-03 MED ORDER — SPIRONOLACTONE 12.5 MG HALF TABLET: 12.5000 mg | ORAL_TABLET | Freq: Two times a day (BID) | ORAL | Status: DC

## 2013-08-10 ENCOUNTER — Other Ambulatory Visit: Payer: Self-pay

## 2013-08-10 MED ORDER — POTASSIUM CHLORIDE CRYS ER 20 MEQ PO TBCR: 40.0000 meq | EXTENDED_RELEASE_TABLET | Freq: Every day | ORAL | Status: DC

## 2013-08-15 ENCOUNTER — Other Ambulatory Visit: Payer: Self-pay

## 2013-08-15 MED ORDER — SPIRONOLACTONE 12.5 MG HALF TABLET: 12.5000 mg | ORAL_TABLET | Freq: Two times a day (BID) | ORAL | Status: DC

## 2013-08-16 ENCOUNTER — Other Ambulatory Visit: Payer: Self-pay

## 2013-08-16 MED ORDER — POTASSIUM CHLORIDE CRYS ER 20 MEQ PO TBCR: 40.0000 meq | EXTENDED_RELEASE_TABLET | Freq: Every day | ORAL | Status: DC

## 2013-08-16 MED ORDER — SPIRONOLACTONE 12.5 MG HALF TABLET: 12.5000 mg | ORAL_TABLET | Freq: Two times a day (BID) | ORAL | Status: DC

## 2013-08-20 ENCOUNTER — Other Ambulatory Visit: Payer: Self-pay | Admitting: Cardiology

## 2013-08-20 ENCOUNTER — Encounter: Payer: Self-pay | Admitting: Cardiology

## 2013-08-20 ENCOUNTER — Ambulatory Visit (INDEPENDENT_AMBULATORY_CARE_PROVIDER_SITE_OTHER): Payer: Medicare Other | Admitting: Cardiology

## 2013-08-20 VITALS — BP 117/64 | HR 69 | Ht 63.0 in | Wt 231.0 lb

## 2013-08-20 DIAGNOSIS — I4891 Unspecified atrial fibrillation: Secondary | ICD-10-CM

## 2013-08-20 DIAGNOSIS — I5032 Chronic diastolic (congestive) heart failure: Secondary | ICD-10-CM

## 2013-08-20 DIAGNOSIS — E871 Hypo-osmolality and hyponatremia: Secondary | ICD-10-CM

## 2013-08-20 DIAGNOSIS — I509 Heart failure, unspecified: Secondary | ICD-10-CM

## 2013-08-20 DIAGNOSIS — Z7901 Long term (current) use of anticoagulants: Secondary | ICD-10-CM

## 2013-08-20 MED ORDER — SPIRONOLACTONE 12.5 MG HALF TABLET: 12.5000 mg | ORAL_TABLET | Freq: Two times a day (BID) | ORAL | Status: AC

## 2013-08-20 MED ORDER — FUROSEMIDE 20 MG PO TABS: 40.0000 mg | ORAL_TABLET | Freq: Two times a day (BID) | ORAL | Status: DC

## 2013-08-20 NOTE — Patient Instructions (Signed)
Your physician has recommended you make the following change in your medication:   1. Decrease Lasix to 40 mg twice a day (2 tablets in the am and 2 tables in the pm).  **Patient is being referred to Dr. Gala Romney**  Your physician recommends that you have labs today: BMET  Your physician recommends that you schedule a follow-up appointment in: 1 month with Dr. Anne Fu

## 2013-08-20 NOTE — Progress Notes (Signed)
[address], [address] [place], [place]  [address] Phone: [phone] Fax:  [phone]  Date:  08/20/2013   ID:  [name], DOB Feb 21, 1933, MRN [id number]  PCP:  [name], MD   History of Present Illness: [name] is a 79 y.o. female with chronic diastolic heart failure with multiple recurrent hospitalizations, normal EF, bypass with aortic valve replacement in 2009, morbid obesity, type 2 diabetes and bradycardia with pacemaker as well as paroxysmal atrial fibrillation here for followup.  06/15/13-she was taking too metolazone since previous visit. Her weight was up. She was worried about her dog with tooth abscess. [name], her daughter states that she constantly preaches to her about her fluid intake and salt intake. No knee replacement do to poor surgical candidate.  07/19/13 - Ortho with injection in knee. No SHOB. No CP. Knee a little improved. She takes metolazone extra approximately once per week. She still admits to dietary indiscretions. Her weight has been stable over the last month. No shortness of breath, no edema in lower extremities. She is weak. No chest pain.  08/20/13- unfortunately, she was back in the hospital. On 07/23/13. Her primary diagnosis was acute respiratory failure with hypoxia.-Was that this was most likely secondary to COPD exacerbation and pneumonia. Her diastolic heart failure appeared to be clinically compensated. She's had some issues with hyponatremia. She states that she did not get blood work previously by primary physician.   Wt Readings from Last 3 Encounters:  08/20/13 231 lb (104.781 kg)  07/26/13 236 lb 12.8 oz (107.412 kg)  07/19/13 228 lb (103.42 kg)     Past Medical History  Diagnosis Date  . Coronary artery disease 09/04/08    single vessel CABG at time of AVR   . Aortic stenosis 09/04/08    s/p AVR by Dr Tyrone Sage (tissue valve)  . Paroxysmal atrial fibrillation   . LBBB (left bundle branch block)   . CHF  (congestive heart failure)     diastolic  . Sleep apnea   . Alzheimer's dementia   . Diabetes mellitus     type 2  . Hypertension   . Chronic anemia   . Osteoporosis   . Chronic renal failure   . Morbid obesity with BMI of 50.0-59.9, adult 03/09/2012  . Peripheral vascular disease   . Arthritis   . Chronic diastolic heart failure 07/20/2013    Past Surgical History  Procedure Laterality Date  . Coronary artery bypass graft  09/04/08    single vessel at time of AVR  . Aortic valve replacement  12/09    tissue valve by Dr Tyrone Sage  . Cholecystectomy  1995  . Cataract extraction    . Replacement total knee bilateral      L 2000, R 1997  . Appendectomy    . Closed reduction femoral shaft fracture  2007    R intramedullary Stacy  . Tee without cardioversion N/A 02/07/2013    Procedure: TRANSESOPHAGEAL ECHOCARDIOGRAM (TEE);  Surgeon: Donato Schultz, MD;  Location: [facility] ENDOSCOPY;  Service: Cardiovascular;  Laterality: N/A;    Current Outpatient Prescriptions  Medication Sig Dispense Refill  . acetaminophen (TYLENOL) 325 MG tablet Take 162.5 mg by mouth every 6 (six) hours as needed. For pain      . albuterol (PROVENTIL HFA;VENTOLIN HFA) 108 (90 BASE) MCG/ACT inhaler Inhale 2 puffs into the lungs every 6 (six) hours as needed for wheezing.  1 Inhaler  2  . amiodarone (PACERONE) 100  MG tablet Take 1 tablet (100 mg total) by mouth 2 (two) times daily.  60 tablet  12  . aspirin EC 81 MG tablet Take 1 tablet (81 mg total) by mouth daily.  31 tablet  0  . diclofenac sodium (VOLTAREN) 1 % GEL Apply topically.      . docusate sodium (COLACE) 100 MG capsule Take 1 capsule (100 mg total) by mouth 2 (two) times daily as needed. For constipation  30 capsule  0  . donepezil (ARICEPT) 10 MG tablet Take 1 tablet (10 mg total) by mouth at bedtime.  30 tablet  0  . ferrous sulfate 325 (65 FE) MG tablet Take 1 tablet (325 mg total) by mouth 2 (two) times daily.  60 tablet  0  . fish oil-omega-3 fatty acids  1000 MG capsule Take 1 g by mouth daily.      . furosemide (LASIX) 20 MG tablet Take 40 mg by mouth 2 (two) times daily. 2 tabs in the A.M. And 1 tab in the afternoon, 2 tabs at night      . gabapentin (NEURONTIN) 600 MG tablet Take 300-600 mg by mouth 2 (two) times daily. Take 1/2 tab in the morning and 1 tab at bedtime      . insulin glargine (LANTUS) 100 UNIT/ML injection Inject 24 Units into the skin every evening.      . lactobacillus acidophilus (BACID) TABS tablet Take 2 tablets by mouth 2 (two) times daily.      Marland Kitchen levofloxacin (LEVAQUIN) 500 MG tablet Take 1 tablet (500 mg total) by mouth daily.  6 tablet  0  . metoprolol (LOPRESSOR) 50 MG tablet Take 1 tablet (50 mg total) by mouth 2 (two) times daily.  60 tablet  12  . Multiple Vitamin (MULTIVITAMIN) tablet Take 1 tablet by mouth daily.        . Multiple Vitamins-Minerals (PRESERVISION AREDS PO) Take 1 capsule by mouth daily.      Marland Kitchen PARoxetine (PAXIL) 40 MG tablet Take 1 tablet (40 mg total) by mouth every morning.  30 tablet  1  . polyethylene glycol (MIRALAX / GLYCOLAX) packet Take 17 g by mouth daily as needed.  14 each  0  . potassium chloride SA (K-DUR,KLOR-CON) 20 MEQ tablet Take 2 tablets (40 mEq total) by mouth daily.  60 tablet  6  . predniSONE (DELTASONE) 10 MG tablet Take 2 tablet for 2 days then one tablet for one day, then stop.  5 tablet  0  . Rivaroxaban (XARELTO) 15 MG TABS tablet Take 1 tablet (15 mg total) by mouth daily with supper.  30 tablet  0  . Sennosides (SENNA) 15 MG TABS Take 1 tablet by mouth daily as needed. For constipation      . simvastatin (ZOCOR) 10 MG tablet Take 1 tablet (10 mg total) by mouth at bedtime.  30 tablet  0  . spironolactone (ALDACTONE) 12.5 mg TABS tablet Take 0.5 tablets (12.5 mg total) by mouth 2 (two) times daily.  60 tablet  6   No current facility-administered medications for this visit.    Allergies:    Allergies  Allergen Reactions  . Morphine And Related Other (See Comments)      "went crazy"-Hallucinations  . Celecoxib Other (See Comments)    Sore mouth  . Codeine   . Oxycodone   . Wellbutrin [Bupropion] Other (See Comments)    Hallucinations  . Zyrtec [Cetirizine] Rash    Social History:  The patient  reports that she has never smoked. She has never used smokeless tobacco. She reports that she does not drink alcohol or use illicit drugs.   ROS:  Please see the history of present illness.   No syncope, no fevers, no bleeding, no orthopnea   All other systems reviewed and negative.   PHYSICAL EXAM: VS:  BP 117/64  Pulse 69  Ht 5\' 3"  (1.6 m)  Wt 231 lb (104.781 kg)  BMI 40.93 kg/m2 Well nourished, well developed, in no acute distressIn wheelchair HEENT: normal Neck: no JVD Cardiac:  normal S1, S2; RRR; no murmur no obvious JVD Lungs:  clear to auscultation bilaterally, no wheezing, rhonchi or rales Abd: soft, nontender, no hepatomegalyobese Ext: 1+ bilateral extremity edema Skin: warm and dry Neuro: no focal abnormalities noted  EKG:  Atrial fibrillation, left bundle branch block, 72    ASSESSMENT AND PLAN:  1. Atrial fibrillation-currently on anticoagulation. No evidence of bleeding. Xarelto 15mg . no changes made. Well  controlled. Last EKG demonstrated sinus tachycardia. Continue with low-dose amiodarone for maintenance. 2. Chronic diastolic heart failure-he is currently stable. I'm fine with her using metolazone on a once weekly basis. She understands that if she overdoes this, this may result in worsening hypokalemia and renal failure. Checking basic metabolic profile today. It is been very challenging to control her over the years. Unfortunately she is had multiple readmissions. I would like for her to see advanced heart failure clinic to see if there any additional resources that [name] help her maintain out of hospital status. 3. Coronary artery disease-status post bypass, doing well, no exertional anginal symptoms. 4. Hypertension-currently well  controlled. 5. Diabetes-per primary physician 6. Morbid obesity-continue to encourage weight loss. 7. I will see her back in one month.  Signed, Donato Schultz, MD [facility]  08/20/2013 3:55 PM

## 2013-08-21 LAB — BASIC METABOLIC PANEL
BUN: 25 mg/dL — ABNORMAL HIGH (ref 6–23)
CO2: 30 mEq/L (ref 19–32)
Calcium: 9 mg/dL (ref 8.4–10.5)
Glucose, Bld: 140 mg/dL — ABNORMAL HIGH (ref 70–99)
Potassium: 3.9 mEq/L (ref 3.5–5.1)
Sodium: 137 mEq/L (ref 135–145)

## 2013-08-27 ENCOUNTER — Telehealth: Payer: Self-pay | Admitting: Cardiology

## 2013-08-27 DIAGNOSIS — E785 Hyperlipidemia, unspecified: Secondary | ICD-10-CM

## 2013-08-27 DIAGNOSIS — K922 Gastrointestinal hemorrhage, unspecified: Secondary | ICD-10-CM

## 2013-08-27 DIAGNOSIS — I10 Essential (primary) hypertension: Secondary | ICD-10-CM

## 2013-08-27 HISTORY — DX: Gastrointestinal hemorrhage, unspecified: K92.2

## 2013-08-27 MED ORDER — POTASSIUM CHLORIDE CRYS ER 20 MEQ PO TBCR: EXTENDED_RELEASE_TABLET | ORAL | Status: DC

## 2013-08-27 MED ORDER — FUROSEMIDE 80 MG PO TABS: 80.0000 mg | ORAL_TABLET | Freq: Two times a day (BID) | ORAL | Status: DC

## 2013-08-27 NOTE — Telephone Encounter (Signed)
Called pt with lab results and pt daughter stated that pt weight had increased to 237lbs. Informed Dr Anne Fu and he advised me to inform pt daughter to increase pt Lasix to 80 mg in AM and 80 mg in PM and to increase her K+ 40 meq in AM and 20 meq in PM. Informed pt daugher [name] of the change and she agreed and would pick up new Rx from [name] on [name]. Informed pt daughter that pt would have to come to office 09-04-13 to get her kidneys checked and daughter agreed.

## 2013-08-27 NOTE — Telephone Encounter (Signed)
Message copied by Christella Noa on Mon Aug 27, 2013  4:36 PM ------      Message from: Jake Bathe      Created: Tue Aug 21, 2013  3:46 PM       Excellent. No changes. ------

## 2013-08-27 NOTE — Telephone Encounter (Signed)
New Problem:  Pt states she has had some weight gain. Pt states she was 225 lbs at her last office visit and now she is 237 lbs. Pt would like to speak to the nurse.

## 2013-09-04 ENCOUNTER — Other Ambulatory Visit (INDEPENDENT_AMBULATORY_CARE_PROVIDER_SITE_OTHER): Payer: Medicare Other

## 2013-09-04 DIAGNOSIS — I10 Essential (primary) hypertension: Secondary | ICD-10-CM

## 2013-09-04 DIAGNOSIS — E785 Hyperlipidemia, unspecified: Secondary | ICD-10-CM

## 2013-09-05 LAB — BASIC METABOLIC PANEL
BUN: 24 mg/dL — ABNORMAL HIGH (ref 6–23)
CO2: 27 mEq/L (ref 19–32)
Chloride: 101 mEq/L (ref 96–112)
Creatinine, Ser: 1.4 mg/dL — ABNORMAL HIGH (ref 0.4–1.2)
GFR: 38.93 mL/min — ABNORMAL LOW (ref >60.00)

## 2013-09-06 ENCOUNTER — Other Ambulatory Visit: Payer: Self-pay

## 2013-09-06 MED ORDER — SIMVASTATIN 10 MG PO TABS: 10.0000 mg | ORAL_TABLET | Freq: Every day | ORAL | Status: DC

## 2013-09-10 ENCOUNTER — Inpatient Hospital Stay (HOSPITAL_COMMUNITY)
Admission: EM | Admit: 2013-09-10 | Discharge: 2013-09-17 | DRG: 982 | Disposition: A | Discharge status: Skilled Nursing Facility | Payer: Medicare Other | Attending: Internal Medicine | Admitting: Internal Medicine

## 2013-09-10 ENCOUNTER — Emergency Department (HOSPITAL_COMMUNITY): Payer: Medicare Other

## 2013-09-10 ENCOUNTER — Encounter (HOSPITAL_COMMUNITY): Payer: Self-pay | Admitting: Emergency Medicine

## 2013-09-10 DIAGNOSIS — I4891 Unspecified atrial fibrillation: Secondary | ICD-10-CM

## 2013-09-10 DIAGNOSIS — Z9849 Cataract extraction status, unspecified eye: Secondary | ICD-10-CM

## 2013-09-10 DIAGNOSIS — R001 Bradycardia, unspecified: Secondary | ICD-10-CM

## 2013-09-10 DIAGNOSIS — K644 Residual hemorrhoidal skin tags: Secondary | ICD-10-CM | POA: Diagnosis present

## 2013-09-10 DIAGNOSIS — Z9089 Acquired absence of other organs: Secondary | ICD-10-CM

## 2013-09-10 DIAGNOSIS — E1149 Type 2 diabetes mellitus with other diabetic neurological complication: Secondary | ICD-10-CM

## 2013-09-10 DIAGNOSIS — L02219 Cutaneous abscess of trunk, unspecified: Secondary | ICD-10-CM | POA: Diagnosis present

## 2013-09-10 DIAGNOSIS — E875 Hyperkalemia: Secondary | ICD-10-CM | POA: Diagnosis not present

## 2013-09-10 DIAGNOSIS — Z6841 Body Mass Index (BMI) 40.0 and over, adult: Secondary | ICD-10-CM

## 2013-09-10 DIAGNOSIS — I959 Hypotension, unspecified: Secondary | ICD-10-CM | POA: Diagnosis present

## 2013-09-10 DIAGNOSIS — I498 Other specified cardiac arrhythmias: Secondary | ICD-10-CM

## 2013-09-10 DIAGNOSIS — I739 Peripheral vascular disease, unspecified: Secondary | ICD-10-CM | POA: Diagnosis present

## 2013-09-10 DIAGNOSIS — N183 Chronic kidney disease, stage 3 unspecified: Secondary | ICD-10-CM | POA: Diagnosis present

## 2013-09-10 DIAGNOSIS — Z794 Long term (current) use of insulin: Secondary | ICD-10-CM

## 2013-09-10 DIAGNOSIS — N179 Acute kidney failure, unspecified: Secondary | ICD-10-CM | POA: Diagnosis present

## 2013-09-10 DIAGNOSIS — Z79899 Other long term (current) drug therapy: Secondary | ICD-10-CM

## 2013-09-10 DIAGNOSIS — F0280 Dementia in other diseases classified elsewhere without behavioral disturbance: Secondary | ICD-10-CM

## 2013-09-10 DIAGNOSIS — B354 Tinea corporis: Secondary | ICD-10-CM | POA: Diagnosis present

## 2013-09-10 DIAGNOSIS — J449 Chronic obstructive pulmonary disease, unspecified: Secondary | ICD-10-CM

## 2013-09-10 DIAGNOSIS — K5900 Constipation, unspecified: Secondary | ICD-10-CM | POA: Diagnosis present

## 2013-09-10 DIAGNOSIS — K648 Other hemorrhoids: Principal | ICD-10-CM | POA: Diagnosis present

## 2013-09-10 DIAGNOSIS — E1142 Type 2 diabetes mellitus with diabetic polyneuropathy: Secondary | ICD-10-CM | POA: Diagnosis present

## 2013-09-10 DIAGNOSIS — E876 Hypokalemia: Secondary | ICD-10-CM

## 2013-09-10 DIAGNOSIS — Z954 Presence of other heart-valve replacement: Secondary | ICD-10-CM

## 2013-09-10 DIAGNOSIS — I129 Hypertensive chronic kidney disease with stage 1 through stage 4 chronic kidney disease, or unspecified chronic kidney disease: Secondary | ICD-10-CM | POA: Diagnosis present

## 2013-09-10 DIAGNOSIS — D696 Thrombocytopenia, unspecified: Secondary | ICD-10-CM

## 2013-09-10 DIAGNOSIS — E119 Type 2 diabetes mellitus without complications: Secondary | ICD-10-CM

## 2013-09-10 DIAGNOSIS — I5032 Chronic diastolic (congestive) heart failure: Secondary | ICD-10-CM

## 2013-09-10 DIAGNOSIS — I509 Heart failure, unspecified: Secondary | ICD-10-CM | POA: Diagnosis present

## 2013-09-10 DIAGNOSIS — K922 Gastrointestinal hemorrhage, unspecified: Secondary | ICD-10-CM

## 2013-09-10 DIAGNOSIS — M6281 Muscle weakness (generalized): Secondary | ICD-10-CM

## 2013-09-10 DIAGNOSIS — Z888 Allergy status to other drugs, medicaments and biological substances status: Secondary | ICD-10-CM

## 2013-09-10 DIAGNOSIS — K625 Hemorrhage of anus and rectum: Secondary | ICD-10-CM

## 2013-09-10 DIAGNOSIS — I5042 Chronic combined systolic (congestive) and diastolic (congestive) heart failure: Secondary | ICD-10-CM

## 2013-09-10 DIAGNOSIS — I495 Sick sinus syndrome: Secondary | ICD-10-CM

## 2013-09-10 DIAGNOSIS — F329 Major depressive disorder, single episode, unspecified: Secondary | ICD-10-CM | POA: Diagnosis present

## 2013-09-10 DIAGNOSIS — F3289 Other specified depressive episodes: Secondary | ICD-10-CM | POA: Diagnosis present

## 2013-09-10 DIAGNOSIS — G4733 Obstructive sleep apnea (adult) (pediatric): Secondary | ICD-10-CM | POA: Diagnosis present

## 2013-09-10 DIAGNOSIS — I447 Left bundle-branch block, unspecified: Secondary | ICD-10-CM | POA: Diagnosis present

## 2013-09-10 DIAGNOSIS — D631 Anemia in chronic kidney disease: Secondary | ICD-10-CM

## 2013-09-10 DIAGNOSIS — E785 Hyperlipidemia, unspecified: Secondary | ICD-10-CM | POA: Diagnosis present

## 2013-09-10 DIAGNOSIS — Z23 Encounter for immunization: Secondary | ICD-10-CM

## 2013-09-10 DIAGNOSIS — I2510 Atherosclerotic heart disease of native coronary artery without angina pectoris: Secondary | ICD-10-CM | POA: Diagnosis present

## 2013-09-10 DIAGNOSIS — H6120 Impacted cerumen, unspecified ear: Secondary | ICD-10-CM | POA: Diagnosis present

## 2013-09-10 DIAGNOSIS — Z96659 Presence of unspecified artificial knee joint: Secondary | ICD-10-CM

## 2013-09-10 DIAGNOSIS — Z8249 Family history of ischemic heart disease and other diseases of the circulatory system: Secondary | ICD-10-CM

## 2013-09-10 DIAGNOSIS — G309 Alzheimer's disease, unspecified: Secondary | ICD-10-CM | POA: Diagnosis present

## 2013-09-10 DIAGNOSIS — M810 Age-related osteoporosis without current pathological fracture: Secondary | ICD-10-CM | POA: Diagnosis present

## 2013-09-10 DIAGNOSIS — Z801 Family history of malignant neoplasm of trachea, bronchus and lung: Secondary | ICD-10-CM

## 2013-09-10 DIAGNOSIS — Z951 Presence of aortocoronary bypass graft: Secondary | ICD-10-CM

## 2013-09-10 DIAGNOSIS — D62 Acute posthemorrhagic anemia: Secondary | ICD-10-CM | POA: Diagnosis present

## 2013-09-10 DIAGNOSIS — Z7901 Long term (current) use of anticoagulants: Secondary | ICD-10-CM

## 2013-09-10 HISTORY — DX: Other chronic pain: G89.29

## 2013-09-10 HISTORY — DX: Otalgia, right ear: H92.01

## 2013-09-10 LAB — CBC WITH DIFFERENTIAL/PLATELET
Basophils Relative: 1 % (ref 0–1)
HCT: 34.9 % — ABNORMAL LOW (ref 36.0–46.0)
Hemoglobin: 11.8 g/dL — ABNORMAL LOW (ref 12.0–15.0)
Lymphocytes Relative: 17 % (ref 12–46)
MCHC: 33.8 g/dL (ref 30.0–36.0)
Monocytes Relative: 12 % (ref 3–12)
Neutro Abs: 4.4 10*3/uL (ref 1.7–7.7)
Neutrophils Relative %: 69 % (ref 43–77)
RBC: 3.7 MIL/uL — ABNORMAL LOW (ref 3.87–5.11)
WBC: 6.4 10*3/uL (ref 4.0–10.5)

## 2013-09-10 LAB — COMPREHENSIVE METABOLIC PANEL
AST: 22 U/L (ref 0–37)
Albumin: 3.4 g/dL — ABNORMAL LOW (ref 3.5–5.2)
Alkaline Phosphatase: 100 U/L (ref 39–117)
BUN: 50 mg/dL — ABNORMAL HIGH (ref 6–23)
CO2: 27 mEq/L (ref 19–32)
Chloride: 98 mEq/L (ref 96–112)
GFR calc non Af Amer: 29 mL/min — ABNORMAL LOW (ref >90)
Potassium: 4.5 mEq/L (ref 3.5–5.1)
Total Bilirubin: 1 mg/dL (ref 0.3–1.2)

## 2013-09-10 LAB — URINALYSIS W MICROSCOPIC + REFLEX CULTURE
Bilirubin Urine: NEGATIVE
Specific Gravity, Urine: 1.009 (ref 1.005–1.030)
pH: 5 (ref 5.0–8.0)

## 2013-09-10 LAB — TROPONIN I: Troponin I: <0.3 ng/mL (ref <0.30)

## 2013-09-10 LAB — GLUCOSE, CAPILLARY: Glucose-Capillary: 144 mg/dL — ABNORMAL HIGH (ref 70–99)

## 2013-09-10 LAB — HEMOGLOBIN AND HEMATOCRIT, BLOOD
HCT: 34.3 % — ABNORMAL LOW (ref 36.0–46.0)
HCT: 31.4 % — ABNORMAL LOW (ref 36.0–46.0)
Hemoglobin: 11.2 g/dL — ABNORMAL LOW (ref 12.0–15.0)

## 2013-09-10 LAB — PROTIME-INR: Prothrombin Time: 19.5 seconds — ABNORMAL HIGH (ref 11.6–15.2)

## 2013-09-10 MED ORDER — SODIUM CHLORIDE 0.9 % IV BOLUS (SEPSIS): 250.0000 mL | INTRAVENOUS | Status: DC | PRN

## 2013-09-10 MED ORDER — FLUCONAZOLE 100 MG PO TABS
100.0000 mg | ORAL_TABLET | Freq: Once | ORAL | Status: AC
  Administered 2013-09-11: 100 mg via ORAL
  Filled 2013-09-10: qty 1

## 2013-09-10 MED ORDER — PAROXETINE HCL 20 MG PO TABS
40.0000 mg | ORAL_TABLET | Freq: Every day | ORAL | Status: DC
  Administered 2013-09-10 – 2013-09-17 (×8): 40 mg via ORAL
  Filled 2013-09-10 (×9): qty 2

## 2013-09-10 MED ORDER — NYSTATIN 100000 UNIT/GM EX POWD
Freq: Two times a day (BID) | CUTANEOUS | Status: DC
  Administered 2013-09-10 – 2013-09-17 (×14): via TOPICAL
  Filled 2013-09-10 (×2): qty 15

## 2013-09-10 MED ORDER — SODIUM CHLORIDE 0.9 % IJ SOLN
3.0000 mL | Freq: Two times a day (BID) | INTRAMUSCULAR | Status: DC
  Administered 2013-09-10 – 2013-09-17 (×13): 3 mL via INTRAVENOUS

## 2013-09-10 MED ORDER — PAROXETINE HCL 20 MG PO TABS
40.0000 mg | ORAL_TABLET | ORAL | Status: DC
  Filled 2013-09-10: qty 2

## 2013-09-10 MED ORDER — GLUCAGON HCL (RDNA) 1 MG IJ SOLR
1.0000 mg | Freq: Once | INTRAMUSCULAR | Status: AC
  Administered 2013-09-10: 1 mg via INTRAVENOUS
  Filled 2013-09-10 (×2): qty 1

## 2013-09-10 MED ORDER — INSULIN ASPART 100 UNIT/ML ~~LOC~~ SOLN
0.0000 [IU] | SUBCUTANEOUS | Status: DC
  Administered 2013-09-10 (×2): 1 [IU] via SUBCUTANEOUS

## 2013-09-10 MED ORDER — ACETAMINOPHEN 650 MG RE SUPP: 650.0000 mg | Freq: Four times a day (QID) | RECTAL | Status: DC | PRN

## 2013-09-10 MED ORDER — GABAPENTIN 600 MG PO TABS
600.0000 mg | ORAL_TABLET | Freq: Every day | ORAL | Status: DC
  Administered 2013-09-10 – 2013-09-16 (×7): 600 mg via ORAL
  Filled 2013-09-10 (×8): qty 1

## 2013-09-10 MED ORDER — DEXTROSE-NACL 5-0.9 % IV SOLN
INTRAVENOUS | Status: DC
  Administered 2013-09-10: 19:00:00 via INTRAVENOUS

## 2013-09-10 MED ORDER — ONDANSETRON HCL 4 MG/2ML IJ SOLN: 4.0000 mg | Freq: Four times a day (QID) | INTRAMUSCULAR | Status: DC | PRN

## 2013-09-10 MED ORDER — GABAPENTIN 300 MG PO CAPS
300.0000 mg | ORAL_CAPSULE | Freq: Every day | ORAL | Status: DC
  Administered 2013-09-10 – 2013-09-17 (×8): 300 mg via ORAL
  Filled 2013-09-10 (×8): qty 1

## 2013-09-10 MED ORDER — AMIODARONE HCL 100 MG PO TABS: 100.0000 mg | ORAL_TABLET | Freq: Two times a day (BID) | ORAL | Status: DC

## 2013-09-10 MED ORDER — ACETAMINOPHEN 325 MG PO TABS
650.0000 mg | ORAL_TABLET | Freq: Four times a day (QID) | ORAL | Status: DC | PRN
  Administered 2013-09-12 – 2013-09-15 (×2): 650 mg via ORAL
  Filled 2013-09-10 (×2): qty 2

## 2013-09-10 MED ORDER — ONDANSETRON HCL 4 MG PO TABS: 4.0000 mg | ORAL_TABLET | Freq: Four times a day (QID) | ORAL | Status: DC | PRN

## 2013-09-10 MED ORDER — ATROPINE SULFATE 0.1 MG/ML IJ SOLN
1.0000 mg | INTRAMUSCULAR | Status: DC | PRN
  Filled 2013-09-10: qty 10

## 2013-09-10 MED ORDER — ALBUTEROL SULFATE HFA 108 (90 BASE) MCG/ACT IN AERS: 2.0000 | INHALATION_SPRAY | Freq: Four times a day (QID) | RESPIRATORY_TRACT | Status: DC | PRN

## 2013-09-10 MED ORDER — PANTOPRAZOLE SODIUM 40 MG IV SOLR
40.0000 mg | Freq: Two times a day (BID) | INTRAVENOUS | Status: DC
  Administered 2013-09-10 – 2013-09-11 (×2): 40 mg via INTRAVENOUS
  Filled 2013-09-10 (×3): qty 40

## 2013-09-10 MED ORDER — SODIUM CHLORIDE 0.9 % IV SOLN
INTRAVENOUS | Status: AC
  Administered 2013-09-10: 17:00:00 via INTRAVENOUS

## 2013-09-10 NOTE — ED Notes (Addendum)
Per pt she has had rt ear pain shooting up to head for the past week and she gets dizzy  and she has had rectal bleed x 2 days ? From internal hemmorid

## 2013-09-10 NOTE — Consult Note (Signed)
Admit date: 09/10/2013 Referring Physician  Dr. York Spaniel Primary Physician [name], MD Primary Cardiologist  [facility] Reason for Consultation  Bradycardia  HPI: 79 year-old [name] with chronic diastolic heart failure, recurrent multiple hospitalizations, normal ejection fraction with prior coronary artery disease/bypass surgery with aortic valve replacement in 2009, morbid obesity, deconditioning, type 2 diabetes with prior bradycardia as well as paroxysmal atrial fibrillation on amiodarone for suppression as well as a Xarelto for anticoagulation who presented here with GI bleed. 2-3 episodes of bright red blood per been since yesterday associated with dizziness, diaphoresis, shortness of breath. No abdominal pain. Hemoglobin 11.8, troponin normal, chest x-ray with worsening aeration bilaterally.   EKG demonstrates what looks like atrial fibrillation, left bundle branch block with heart rate of 42 beats per minute. There is grouped beating however and perhaps P waves preceding dress complexes which may represent retrograde P wave conduction. Nonetheless significantly bradycardic.  Currently [name] is laying flat in her bed in the emergency department with her family surrounding her. [name] does not appear to be in distress. No significant shortness of breath laying flat.    PMH:   Past Medical History  Diagnosis Date  . Coronary artery disease 09/04/08    single vessel CABG at time of AVR   . Aortic stenosis 09/04/08    s/p AVR by Dr Tyrone Sage (tissue valve)  . Paroxysmal atrial fibrillation   . LBBB (left bundle branch block)   . CHF (congestive heart failure)     diastolic  . Sleep apnea   . Alzheimer's dementia   . Diabetes mellitus     type 2  . Hypertension   . Chronic anemia   . Osteoporosis   . Chronic renal failure   . Morbid obesity with BMI of 50.0-59.9, adult 03/09/2012  . Peripheral vascular disease   . Arthritis   . Chronic diastolic heart failure 07/20/2013  .  Chronic right ear pain     "nerve damage"    PSH:   Past Surgical History  Procedure Laterality Date  . Coronary artery bypass graft  09/04/08    single vessel at time of AVR  . Aortic valve replacement  12/09    tissue valve by Dr Tyrone Sage  . Cholecystectomy  1995  . Cataract extraction    . Replacement total knee bilateral      L 2000, R 1997  . Appendectomy    . Closed reduction femoral shaft fracture  2007    R intramedullary rod  . Tee without cardioversion N/A 02/07/2013    Procedure: TRANSESOPHAGEAL ECHOCARDIOGRAM (TEE);  Surgeon: Donato Schultz, MD;  Location: [facility] ENDOSCOPY;  Service: Cardiovascular;  Laterality: N/A;   Allergies:  Morphine and related; Celecoxib; Codeine; Oxycodone; Wellbutrin; and Zyrtec Prior to Admit Meds:   Prior to Admission medications   Medication Sig Start Date End Date Taking? Authorizing Provider  acetaminophen (TYLENOL) 325 MG tablet Take 162.5 mg by mouth every 6 (six) hours as needed for mild pain. For pain   Yes Dorothea Ogle, MD  albuterol (PROVENTIL HFA;VENTOLIN HFA) 108 (90 BASE) MCG/ACT inhaler Inhale 2 puffs into the lungs every 6 (six) hours as needed for wheezing. 07/26/13  Yes Belkys A Regalado, MD  amiodarone (PACERONE) 100 MG tablet Take 1 tablet (100 mg total) by mouth 2 (two) times daily. 02/09/13  Yes Donato Schultz, MD  aspirin EC 81 MG tablet Take 1 tablet (81 mg total) by mouth daily. 08/11/12  Yes Jacquelyn A McGill, MD  diclofenac sodium (  VOLTAREN) 1 % GEL Apply 2 g topically as needed (pain).    Yes Historical Provider, MD  docusate sodium (COLACE) 100 MG capsule Take 1 capsule (100 mg total) by mouth 2 (two) times daily as needed. For constipation 08/14/12  Yes Brent Bulla, MD  donepezil (ARICEPT) 10 MG tablet Take 1 tablet (10 mg total) by mouth at bedtime. 08/11/12  Yes Jacquelyn A McGill, MD  ferrous sulfate 325 (65 FE) MG tablet Take 1 tablet (325 mg total) by mouth 2 (two) times daily. 08/11/12  Yes Jacquelyn A McGill, MD  fish  oil-omega-3 fatty acids 1000 MG capsule Take 1 g by mouth daily. 08/04/12  Yes Dorothea Ogle, MD  furosemide (LASIX) 80 MG tablet Take 40-80 mg by mouth 2 (two) times daily. Takes 80 mg in the morning and 40 mg in the evening   Yes Historical Provider, MD  gabapentin (NEURONTIN) 600 MG tablet Take 300-600 mg by mouth 2 (two) times daily. Take 1/2 tab in the morning and 1 tab at bedtime 08/11/12  Yes Jacquelyn A McGill, MD  insulin glargine (LANTUS) 100 UNIT/ML injection Inject 24 Units into the skin every evening. 08/11/12  Yes Jacquelyn A McGill, MD  lactobacillus acidophilus (BACID) TABS tablet Take 1 tablet by mouth daily.    Yes Historical Provider, MD  metoprolol (LOPRESSOR) 50 MG tablet Take 1 tablet (50 mg total) by mouth 2 (two) times daily. 02/09/13  Yes Donato Schultz, MD  Multiple Vitamin (MULTIVITAMIN) tablet Take 1 tablet by mouth daily.    Yes Historical Provider, MD  Multiple Vitamins-Minerals (PRESERVISION AREDS PO) Take 1 capsule by mouth daily.   Yes Historical Provider, MD  PARoxetine (PAXIL) 40 MG tablet Take 1 tablet (40 mg total) by mouth every morning. 08/04/12  Yes Dorothea Ogle, MD  polyethylene glycol [facility] Ethelene Hal) packet Take 17 g by mouth daily as needed. 07/26/13  Yes Belkys A Regalado, MD  potassium chloride SA (K-DUR,KLOR-CON) 20 MEQ tablet Take 20 mEq by mouth 3 (three) times daily.   Yes Historical Provider, MD  Rivaroxaban (XARELTO) 15 MG TABS tablet Take 1 tablet (15 mg total) by mouth daily with supper. 03/16/13  Yes Leroy Sea, MD  Sennosides (SENNA) 15 MG TABS Take 1 tablet by mouth 2 (two) times daily. For constipation   Yes Historical Provider, MD  simvastatin (ZOCOR) 10 MG tablet Take 1 tablet (10 mg total) by mouth at bedtime. 09/06/13  Yes Donato Schultz, MD  spironolactone (ALDACTONE) 12.5 mg TABS tablet Take 0.5 tablets (12.5 mg total) by mouth 2 (two) times daily. 08/20/13  Yes Donato Schultz, MD   Fam HX:    Family History  Problem Relation Age of  Onset  . Other Father 52    deceased. multiple MI   . Lung cancer Mother 76    died  . Cancer Mother   . Heart attack Father    Social HX:    History   Social History  . Marital Status: Widowed    Spouse Name: N/A    Number of Children: N/A  . Years of Education: N/A   Occupational History  . retired     lives with her daughter   Social History Main Topics  . Smoking status: Never Smoker   . Smokeless tobacco: Never Used  . Alcohol Use: No  . Drug Use: No  . Sexual Activity: No   Other Topics Concern  . Not on file   Social History Narrative  Lives in [place] with daughter, [name], who is an Charity fundraiser.     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   Widowed 2003     ROS:  All 11 ROS were addressed and are negative except what is stated in the HPI   Physical Exam: Blood pressure 109/84, pulse 30, temperature 97.5 F (36.4 C), temperature source Oral, resp. rate 14, SpO2 99.00%.   General: Well developed, well nourished, in no acute distress Head: Eyes PERRLA, No xanthomas. Alopecia  Normal cephalic and atramatic  Lungs:   Clear bilaterally to auscultation and percussion. Normal respiratory effort. No wheezes, no rales. No significant crackles appreciated Heart:   Bradycardic, fairly regular with occasional ectopy. Pulses are 2+ & equal. No murmur, rubs, gallops.  No carotid bruit. No JVD.  No abdominal bruits.  Abdomen: Bowel sounds are positive, abdomen soft and non-tender without masses. No hepatosplenomegaly. Obese Msk:  Back normal. Normal strength and tone for age. Extremities:  No clubbing, cyanosis or edema.  DP +1 Neuro: Alert responded when I walked into room by greeting me by name, non-focal, MAE x 4 GU: Deferred Rectal: Deferred Psych:  Good affect, responds appropriately      Labs: Lab Results  Component Value Date   WBC 6.4 09/10/2013   HGB 11.8* 09/10/2013   HCT 34.9* 09/10/2013   MCV 94.3 09/10/2013   PLT 137* 09/10/2013      Recent Labs Lab 09/10/13 1325  NA 136  K 4.5  CL 98  CO2 27  BUN 50*  CREATININE 1.64*  CALCIUM 9.0  PROT 7.2  BILITOT 1.0  ALKPHOS 100  ALT 13  AST 22  GLUCOSE 283*    Recent Labs  09/10/13 1325  TROPONINI <0.30   Lab Results  Component Value Date   CHOL 143 02/03/2013   HDL 74 02/03/2013   LDLCALC 62 02/03/2013   TRIG 34 02/03/2013   No results found for this basename: DDIMER     Radiology:  Dg Chest 2 View  09/10/2013   CLINICAL DATA:  Coronary artery disease.  Congestive heart failure.  EXAM: CHEST  2 VIEW  COMPARISON:  07/23/2013.  FINDINGS: Cardiomegaly. Prior CABG. Bilateral airspace opacity likely congestive heart failure although bilateral pneumonia not excluded. Significant worsening aeration from priors. Calcified tortuous aorta.  IMPRESSION: Cardiomegaly with suspected congestive heart failure.   Electronically Signed   By: Davonna Belling M.D.   On: 09/10/2013 14:19   Personally viewed.  EKG:  demonstrates what looks like atrial fibrillation, left bundle branch block with heart rate of 42 beats per minute. There is grouped beating however and perhaps P waves preceding dress complexes which may represent retrograde P wave conduction. Nonetheless significantly bradycardic. Personally viewed.   ECHO:- Left ventricle: The cavity size was normal. Wall thickness was normal. Systolic function was mildly reduced. The estimated ejection fraction was in the range of 45% to 50%. - Aortic valve: A prosthesis was present and functioning normally. The prosthesis had a normal range of motion. The sewing ring appeared normal, had no rocking motion, and showed no evidence of dehiscence. - Mitral valve: Moderately calcified annulus. Mildly thickened leaflets . No evidence of vegetation. Mild regurgitation. - Left atrium: No evidence of thrombus in the atrial cavity or appendage. No evidence of thrombus in the appendage. - Right atrium: No evidence of thrombus in the  atrial cavity or appendage. - Atrial septum: No defect or patent foramen ovale was identified. - Tricuspid valve: No  evidence of vegetation. No evidence of vegetation. - Pulmonic valve: No evidence of vegetation. - Pulmonary arteries: Systolic pressure was moderately increased. - Superior vena cava: The study excluded a thrombus.   ASSESSMENT/PLAN:    79 year old [name] with chronic diastolic heart failure, multiple readmissions, paroxysmal atrial fibrillation on chronic anticoagulation here with bradycardia, GI bleed and pulmonary edema.  1. Bradycardia-agree with discontinuation of metoprolol which [name] was taking 50 mg twice a day as well as amiodarone 100 mg daily. [name] has a history of atrial tachycardia for which both amiodarone and metoprolol have been helpful. Unfortunately, unable to utilize at this time. Blood pressure remaines stable. Mentating well. Not significantly short of breath. Continue resuscitative efforts in relation to GI bleeding.  I discussed with family that if heart rate deteriorates, temporary pacemaker wire may be needed. For now,Zoll pads and close monitoring.  2. Paroxysmal atrial fibrillation/atrial tachycardia-last EKG demonstrated heart rate of approximately 110, atrial tachycardia. [name] cannot take anticoagulation at this time.  3. Chronic diastolic heart failure-chest x-ray appears to have pulmonary edema pattern. [name]'s had multiple admissions for diastolic heart failure. [name] seems to be breathing quite comfortably currently. Laying flat. Continue to monitor. If packed red blood cells are required, it would likely be beneficial to follow these with Lasix depending on blood pressure. For now with relative hypotension, I agree with holding Lasix and spironolactone.  4. GI bleed-per primary team. Her daughter states that [name] was dripping blood earlier this morning. Looked like a crime scene is how [name] describes it. Holding Xarelto and ASA.   5. Coronary artery  disease-stable, no anginal symptoms  6. Aortic valve replacement-bioprosthetic. Does not require anticoagulation for this.  7. Morbid obesity-encourage weight loss   8. Chronic kidney disease stage III-creatinine increased from 1.4 up to 1.64 with BUN increase of 24-50. BUN may be increased because of GI bleeding.  9. Diabetes-per primary team.  We will closely follow. Donato Schultz, MD  09/10/2013  4:26 PM

## 2013-09-10 NOTE — H&P (Addendum)
[facility] History and Physical  [name]:[id number] DOB: [birth year] DOA: 09/10/2013  Referring physician:  PCP: [name], MD  Specialists:   Chief Complaint: GIB  HPI: [name] is a 79 y.o. female with IDDM, HTN, CAD, s/p AVR, PAF on xarelto presented with 2-3 episodes of BRBPR since yesterday, associated with dizziness, SOB, diaphoresis; denies abdominal pain, no chest pain, no nausea, vomiting, no hematemesis; patient has dementia, and history was obtained from family at the bedside;    Review of Systems: The patient denies anorexia, fever, weight loss,, vision loss, decreased hearing, hoarseness, chest pain, syncope, dyspnea on exertion, peripheral edema, balance deficits, hemoptysis, abdominal pain, melena, hematochezia, severe indigestion/heartburn, hematuria, incontinence, genital sores, muscle weakness, suspicious skin lesions, transient blindness, difficulty walking, depression, unusual weight change, abnormal bleeding, enlarged lymph nodes, angioedema, and breast masses.    Past Medical History  Diagnosis Date  . Coronary artery disease 09/04/08    single vessel CABG at time of AVR   . Aortic stenosis 09/04/08    s/p AVR by Dr Tyrone Sage (tissue valve)  . Paroxysmal atrial fibrillation   . LBBB (left bundle branch block)   . CHF (congestive heart failure)     diastolic  . Sleep apnea   . Alzheimer's dementia   . Diabetes mellitus     type 2  . Hypertension   . Chronic anemia   . Osteoporosis   . Chronic renal failure   . Morbid obesity with BMI of 50.0-59.9, adult 03/09/2012  . Peripheral vascular disease   . Arthritis   . Chronic diastolic heart failure 07/20/2013  . Chronic right ear pain     "nerve damage"   Past Surgical History  Procedure Laterality Date  . Coronary artery bypass graft  09/04/08    single vessel at time of AVR  . Aortic valve replacement  12/09    tissue valve by Dr Tyrone Sage  . Cholecystectomy  1995  . Cataract  extraction    . Replacement total knee bilateral      L 2000, R 1997  . Appendectomy    . Closed reduction femoral shaft fracture  2007    R intramedullary rod  . Tee without cardioversion N/A 02/07/2013    Procedure: TRANSESOPHAGEAL ECHOCARDIOGRAM (TEE);  Surgeon: Donato Schultz, MD;  Location: [facility] ENDOSCOPY;  Service: Cardiovascular;  Laterality: N/A;   Social History:  reports that she has never smoked. She has never used smokeless tobacco. She reports that she does not drink alcohol or use illicit drugs. Home;  where does patient live--home, ALF, SNF? and with whom if at home? No;  Can patient participate in ADLs?  Allergies  Allergen Reactions  . Morphine And Related Other (See Comments)    "went crazy"-Hallucinations  . Celecoxib Other (See Comments)    Sore mouth  . Codeine   . Oxycodone   . Wellbutrin [Bupropion] Other (See Comments)    Hallucinations  . Zyrtec [Cetirizine] Rash    Family History  Problem Relation Age of Onset  . Other Father 69    deceased. multiple MI   . Lung cancer Mother 75    died  . Cancer Mother   . Heart attack Father     (be sure to complete)  Prior to Admission medications   Medication Sig Start Date End Date Taking? Authorizing Provider  acetaminophen (TYLENOL) 325 MG tablet Take 162.5 mg by mouth every 6 (six) hours as needed for mild pain. For pain   Yes  Dorothea Ogle, MD  albuterol (PROVENTIL HFA;VENTOLIN HFA) 108 (90 BASE) MCG/ACT inhaler Inhale 2 puffs into the lungs every 6 (six) hours as needed for wheezing. 07/26/13  Yes Belkys A Regalado, MD  amiodarone (PACERONE) 100 MG tablet Take 1 tablet (100 mg total) by mouth 2 (two) times daily. 02/09/13  Yes Donato Schultz, MD  aspirin EC 81 MG tablet Take 1 tablet (81 mg total) by mouth daily. 08/11/12  Yes Jacquelyn A McGill, MD  diclofenac sodium (VOLTAREN) 1 % GEL Apply 2 g topically as needed (pain).    Yes Historical Provider, MD  docusate sodium (COLACE) 100 MG capsule Take 1 capsule (100  mg total) by mouth 2 (two) times daily as needed. For constipation 08/14/12  Yes Brent Bulla, MD  donepezil (ARICEPT) 10 MG tablet Take 1 tablet (10 mg total) by mouth at bedtime. 08/11/12  Yes Jacquelyn A McGill, MD  ferrous sulfate 325 (65 FE) MG tablet Take 1 tablet (325 mg total) by mouth 2 (two) times daily. 08/11/12  Yes Jacquelyn A McGill, MD  fish oil-omega-3 fatty acids 1000 MG capsule Take 1 g by mouth daily. 08/04/12  Yes Dorothea Ogle, MD  furosemide (LASIX) 80 MG tablet Take 40-80 mg by mouth 2 (two) times daily. Takes 80 mg in the morning and 40 mg in the evening   Yes Historical Provider, MD  gabapentin (NEURONTIN) 600 MG tablet Take 300-600 mg by mouth 2 (two) times daily. Take 1/2 tab in the morning and 1 tab at bedtime 08/11/12  Yes Jacquelyn A McGill, MD  insulin glargine (LANTUS) 100 UNIT/ML injection Inject 24 Units into the skin every evening. 08/11/12  Yes Jacquelyn A McGill, MD  lactobacillus acidophilus (BACID) TABS tablet Take 1 tablet by mouth daily.    Yes Historical Provider, MD  metoprolol (LOPRESSOR) 50 MG tablet Take 1 tablet (50 mg total) by mouth 2 (two) times daily. 02/09/13  Yes Donato Schultz, MD  Multiple Vitamin (MULTIVITAMIN) tablet Take 1 tablet by mouth daily.    Yes Historical Provider, MD  Multiple Vitamins-Minerals (PRESERVISION AREDS PO) Take 1 capsule by mouth daily.   Yes Historical Provider, MD  PARoxetine (PAXIL) 40 MG tablet Take 1 tablet (40 mg total) by mouth every morning. 08/04/12  Yes Dorothea Ogle, MD  polyethylene glycol [facility] Ethelene Hal) packet Take 17 g by mouth daily as needed. 07/26/13  Yes Belkys A Regalado, MD  potassium chloride SA (K-DUR,KLOR-CON) 20 MEQ tablet Take 20 mEq by mouth 3 (three) times daily.   Yes Historical Provider, MD  Rivaroxaban (XARELTO) 15 MG TABS tablet Take 1 tablet (15 mg total) by mouth daily with supper. 03/16/13  Yes Leroy Sea, MD  Sennosides (SENNA) 15 MG TABS Take 1 tablet by mouth 2 (two) times daily.  For constipation   Yes Historical Provider, MD  simvastatin (ZOCOR) 10 MG tablet Take 1 tablet (10 mg total) by mouth at bedtime. 09/06/13  Yes Donato Schultz, MD  spironolactone (ALDACTONE) 12.5 mg TABS tablet Take 0.5 tablets (12.5 mg total) by mouth 2 (two) times daily. 08/20/13  Yes Donato Schultz, MD   Physical Exam: Filed Vitals:   09/10/13 1513  BP: 119/45  Pulse: 38  Temp:   Resp: 16     General:  Alert, confused at baseline   Eyes: OEM-I  ENT: no oral ulcers   Neck: supple, no JVD   Cardiovascular: s1,s2 mild systolic mr   Respiratory: few crackles in LL  Abdomen: soft, nt,  nd, cellulitis in lower abdomen   Skin: some cellulitic rash in abdomen, ecchymosis   Musculoskeletal: mild LE edema   Psychiatric: no hallucinations   Neurologic: CN 2-12 intact, confused at baseline; motor 5/5 BL   Labs on Admission:  Basic Metabolic Panel:  Recent Labs Lab 09/04/13 1556 09/10/13 1325  NA 137 136  K 4.8 4.5  CL 101 98  CO2 27 27  GLUCOSE 214* 283*  BUN 24* 50*  CREATININE 1.4* 1.64*  CALCIUM 9.0 9.0   Liver Function Tests:  Recent Labs Lab 09/10/13 1325  AST 22  ALT 13  ALKPHOS 100  BILITOT 1.0  PROT 7.2  ALBUMIN 3.4*   No results found for this basename: LIPASE, AMYLASE,  in the last 168 hours No results found for this basename: AMMONIA,  in the last 168 hours CBC:  Recent Labs Lab 09/10/13 1325  WBC 6.4  NEUTROABS 4.4  HGB 11.8*  HCT 34.9*  MCV 94.3  PLT 137*   Cardiac Enzymes:  Recent Labs Lab 09/10/13 1325  TROPONINI <0.30    BNP (last 3 results)  Recent Labs  02/04/13 1050 03/13/13 1345 07/23/13 1905  PROBNP 23347.0* 6455.0* 6746.0*   CBG: No results found for this basename: GLUCAP,  in the last 168 hours  Radiological Exams on Admission: Dg Chest 2 View  09/10/2013   CLINICAL DATA:  Coronary artery disease.  Congestive heart failure.  EXAM: CHEST  2 VIEW  COMPARISON:  07/23/2013.  FINDINGS: Cardiomegaly. Prior CABG.  Bilateral airspace opacity likely congestive heart failure although bilateral pneumonia not excluded. Significant worsening aeration from priors. Calcified tortuous aorta.  IMPRESSION: Cardiomegaly with suspected congestive heart failure.   Electronically Signed   By: Davonna Belling M.D.   On: 09/10/2013 14:19    EKG: Independently reviewed. A fib with bradycardia, lbbb  Assessment/Plan Active Problems:   ALZHEIMERS DISEASE   PAROXYSMAL ATRIAL FIBRILLATION   GIB (gastrointestinal bleeding)   Acute blood loss anemia   DM (diabetes mellitus)   Bradycardia   79 y.o. female with IDDM, HTN, CAD, s/p AVR, PAF on xarelto is admitted with 2-3 episodes of GIB, and bradycardia   1. GIB on xarelto; hypotensive in ED  -SDU: type/screen/TF prn, close monitor Hg; d/w GI Dr. Elnoria Howard, NPO, PPI; IVF as needed; hold xarelto, ASA  2. Acute blood loss anemia; as above  3. A fib bradycardia on BB; likely sinus node dysfunction;    -hold BB; IV glucagon; TC pacer at bedside; hold aricept; c/s cardiology for PPM eval;  -hold xarelto, ASA; patient is not a good candidate for anticoagulation due to multiple reasons GIB, fall risk;   4. Chronic CHF; patient is not in distress; echo (01/2013): LVEF 45%; may need IVF resuscitation with hypotension/GIB -hold diuresis (lasix, spironalactone) with hypotension, NPO; close monitor volume status resume diuretics as needed; need IVF for hypotension;   5. AKI on CKD III; close monitor while on diuretics   6. DM HA1C-6.8 (06/2013);  -ISS while NPO;   7. Lower abd wall cellulitis, likely fungal, afebrile, no leukocytosis; use topical nystatin, x 1 fluconazole PO;    GI, cardiology;  if consultant consulted, please document name and whether formally or informally consulted  Code Status: full (must indicate code status--if unknown or must be presumed, indicate so) Family Communication: daughter at the bedside (indicate person spoken with, if applicable, with phone number  if by telephone) Disposition Plan: 24-48 hours  (indicate anticipated LOS)  Time spent: >45 minutes  Esperanza Sheets [facility] Pager [phone]  If 7PM-7AM, please contact night-coverage [email or web address] Password [facility] 09/10/2013, 3:29 PM

## 2013-09-10 NOTE — ED Notes (Signed)
MD at bedside. 

## 2013-09-10 NOTE — Consult Note (Signed)
Unassigned patient Reason for Consult: Rectal bleeding.  Referring Physician: THP  [name] is an 79 y.o. female.  HPI: 79 year old white female, with multiple medical issues listed below, presented to the ER with a 2 day history of rectal bleeding. She feels this may be due to her hemorrhoids as her hemorrhoids bleed whenever she strains. She is on chronic anticoagulation [Xarelto] for atrial fibrillation along with Amiodarone. Her daughter, [name] who is at the bedside describes her internal hemorrhoids prolapsing out at time with blood dripping into the commode when this happens. She denies having any abdominal pain, nausea, vomiting, fever, chills or rigors. She claims she had a colonoscopy several years ago but does not remember who did it. She tells me she had a normal colon at that time.    Past Medical History  Diagnosis Date  . Coronary artery disease 09/04/08    single vessel CABG at time of AVR   . Aortic stenosis 09/04/08    s/p AVR by Dr Tyrone Sage (tissue valve)  . Paroxysmal atrial fibrillation   . LBBB (left bundle branch block)   . CHF (congestive heart failure)     diastolic  . Sleep apnea   . Alzheimer's dementia   . Diabetes mellitus     type 2  . Hypertension   . Chronic anemia   . Osteoporosis   . Chronic renal failure   . Morbid obesity with BMI of 50.0-59.9, adult 03/09/2012  . Peripheral vascular disease   . Arthritis   . Chronic diastolic heart failure 07/20/2013  . Chronic right ear pain     "nerve damage"   Past Surgical History  Procedure Laterality Date  . Coronary artery bypass graft  09/04/08    single vessel at time of AVR  . Aortic valve replacement  12/09    tissue valve by Dr Tyrone Sage  . Cholecystectomy  1995  . Cataract extraction    . Replacement total knee bilateral      L 2000, R 1997  . Appendectomy    . Closed reduction femoral shaft fracture  2007    R intramedullary rod  . Tee without cardioversion N/A 02/07/2013    Procedure:  TRANSESOPHAGEAL ECHOCARDIOGRAM (TEE);  Surgeon: Donato Schultz, MD;  Location: [facility] ENDOSCOPY;  Service: Cardiovascular;  Laterality: N/A;   Family History  Problem Relation Age of Onset  . Other Father 48    deceased. multiple MI   . Lung cancer Mother 33    died  . Cancer Mother   . Heart attack Father    Social History:  reports that she has never smoked. She has never used smokeless tobacco. She reports that she does not drink alcohol or use illicit drugs.  Allergies:  Allergies  Allergen Reactions  . Morphine And Related Other (See Comments)    "went crazy"-Hallucinations  . Celecoxib Other (See Comments)    Sore mouth  . Codeine   . Oxycodone   . Wellbutrin [Bupropion] Other (See Comments)    Hallucinations  . Zyrtec [Cetirizine] Rash   Medications: I have reviewed the patient's current medications.  Results for orders placed during the hospital encounter of 09/10/13 (from the past 48 hour(s))  CBC WITH DIFFERENTIAL     Status: Abnormal   Collection Time    09/10/13  1:25 PM      Result Value Range   WBC 6.4  4.0 - 10.5 K/uL   RBC 3.70 (*) 3.87 - 5.11 MIL/uL  Hemoglobin 11.8 (*) 12.0 - 15.0 g/dL   HCT 16.1 (*) 09.6 - 04.5 %   MCV 94.3  78.0 - 100.0 fL   MCH 31.9  26.0 - 34.0 pg   MCHC 33.8  30.0 - 36.0 g/dL   RDW 40.9  81.1 - 91.4 %   Platelets 137 (*) 150 - 400 K/uL   Neutrophils Relative % 69  43 - 77 %   Neutro Abs 4.4  1.7 - 7.7 K/uL   Lymphocytes Relative 17  12 - 46 %   Lymphs Abs 1.1  0.7 - 4.0 K/uL   Monocytes Relative 12  3 - 12 %   Monocytes Absolute 0.7  0.1 - 1.0 K/uL   Eosinophils Relative 2  0 - 5 %   Eosinophils Absolute 0.1  0.0 - 0.7 K/uL   Basophils Relative 1  0 - 1 %   Basophils Absolute 0.0  0.0 - 0.1 K/uL  COMPREHENSIVE METABOLIC PANEL     Status: Abnormal   Collection Time    09/10/13  1:25 PM      Result Value Range   Sodium 136  135 - 145 mEq/L   Potassium 4.5  3.5 - 5.1 mEq/L   Chloride 98  96 - 112 mEq/L   CO2 27  19 - 32 mEq/L    Glucose, Bld 283 (*) 70 - 99 mg/dL   BUN 50 (*) 6 - 23 mg/dL   Creatinine, Ser 7.82 (*) 0.50 - 1.10 mg/dL   Calcium 9.0  8.4 - 95.6 mg/dL   Total Protein 7.2  6.0 - 8.3 g/dL   Albumin 3.4 (*) 3.5 - 5.2 g/dL   AST 22  0 - 37 U/L   ALT 13  0 - 35 U/L   Alkaline Phosphatase 100  39 - 117 U/L   Total Bilirubin 1.0  0.3 - 1.2 mg/dL   GFR calc non Af Amer 29 (*) >90 mL/min   GFR calc Af Amer 33 (*) >90 mL/min   Comment: (NOTE)     The eGFR has been calculated using the CKD EPI equation.     This calculation has not been validated in all clinical situations.     eGFR's persistently <90 mL/min signify possible Chronic Kidney     Disease.  PROTIME-INR     Status: Abnormal   Collection Time    09/10/13  1:25 PM      Result Value Range   Prothrombin Time 19.5 (*) 11.6 - 15.2 seconds   INR 1.70 (*) 0.00 - 1.49  TROPONIN I     Status: None   Collection Time    09/10/13  1:25 PM      Result Value Range   Troponin I <0.30  <0.30 ng/mL   Comment:            Due to the release kinetics of cTnI,     a negative result within the first hours     of the onset of symptoms does not rule out     myocardial infarction with certainty.     If myocardial infarction is still suspected,     repeat the test at appropriate intervals.  OCCULT BLOOD, POC DEVICE     Status: Abnormal   Collection Time    09/10/13  1:42 PM      Result Value Range   Fecal Occult Bld POSITIVE (*) NEGATIVE  URINALYSIS W MICROSCOPIC + REFLEX CULTURE     Status: Abnormal  Collection Time    09/10/13  2:55 PM      Result Value Range   Color, Urine YELLOW  YELLOW   APPearance CLEAR  CLEAR   Specific Gravity, Urine 1.009  1.005 - 1.030   pH 5.0  5.0 - 8.0   Glucose, UA NEGATIVE  NEGATIVE mg/dL   Hgb urine dipstick SMALL (*) NEGATIVE   Bilirubin Urine NEGATIVE  NEGATIVE   Ketones, ur NEGATIVE  NEGATIVE mg/dL   Protein, ur NEGATIVE  NEGATIVE mg/dL   Urobilinogen, UA 0.2  0.0 - 1.0 mg/dL   Nitrite NEGATIVE  NEGATIVE    Leukocytes, UA SMALL (*) NEGATIVE   WBC, UA 0-2  <3 WBC/hpf   RBC / HPF 0-2  <3 RBC/hpf   Bacteria, UA FEW (*) RARE   Squamous Epithelial / LPF FEW (*) RARE   Casts HYALINE CASTS (*) NEGATIVE   Dg Chest 2 View  09/10/2013   CLINICAL DATA:  Coronary artery disease.  Congestive heart failure.  EXAM: CHEST  2 VIEW  COMPARISON:  07/23/2013.  FINDINGS: Cardiomegaly. Prior CABG. Bilateral airspace opacity likely congestive heart failure although bilateral pneumonia not excluded. Significant worsening aeration from priors. Calcified tortuous aorta.  IMPRESSION: Cardiomegaly with suspected congestive heart failure.   Electronically Signed   By: Davonna Belling M.D.   On: 09/10/2013 14:19   Review of Systems  Constitutional: Negative.   HENT: Positive for tinnitus.   Cardiovascular: Negative.   Gastrointestinal: Negative.   Genitourinary: Negative.   Musculoskeletal: Positive for joint pain.  Skin: Negative.   Neurological: Positive for dizziness. Negative for focal weakness, seizures and loss of consciousness.  Endo/Heme/Allergies: Negative.   Psychiatric/Behavioral: Positive for memory loss.   Blood pressure 124/56, pulse 42, temperature 97.9 F (36.6 C), temperature source Oral, resp. rate 18, height 5\' 3"  (1.6 m), weight 104 kg (229 lb 4.5 oz), SpO2 99.00%. Physical Exam  Constitutional: She appears well-developed and well-nourished.  HENT:  Head: Normocephalic and atraumatic.  Eyes: EOM are normal.  Neck: Normal range of motion.  Cardiovascular: An irregular rhythm present. Bradycardia present.   GI: Soft. She exhibits no shifting dullness, no distension, no ascites and no mass.  Morbidly obese, NTNABS DRE reveals lax sphincter tone with small prolapsing internal hemorrhoids with minimal heme on the digital exam   Assessment/Plan: 1) Rectal bleeding-possibly hemorrhoidal in origin-patient is not interested in having any procedures at this time. She does not feel she can handle a prep  with all her co-morbidities and limited mobility [she walks with a walker at home]. As per my discussion with [name], plans are to watch her for tonight and stabilize her.   2) Atrial fibrillation s/p CABG and AVR.on Xarelto.  3) Morbid obesity/Obstructive sleep apnea. 4) Alzheimer's dementia. 5) Hypertension/hyperlipidemia.  6) Depression.  7) AODM. 8) S/P Bilateral TKR: Extremely limited mobility.    [name] 09/10/2013, 6:28 PM

## 2013-09-10 NOTE — ED Notes (Signed)
Per Pt's daughter, pt began having some frank red rectal bleeding yesterday. Pt also has not been herself, she has been weak, dizzy and generally not feeling well. Pt denies any pain. HR also in the 40's.

## 2013-09-10 NOTE — ED Provider Notes (Signed)
CSN: [id number]     Arrival date & time 09/10/13  1237 History   First MD Initiated Contact with Patient 09/10/13 1315     Chief Complaint  Patient presents with  . Rectal Bleeding  . Fatigue    HPI Pt was seen at 1325. Per pt and her family, c/o gradual onset and worsening of persistent rectal bleeding since yesterday. Has been associated with generalized fatigue which worsens with ambulation. Pt's daughter states there was copious amounts of blood in the commode "and all over the bathroom," yesterday and today. Pt denies rectal pain, no N/V/D, no abd pain, no back pain, no fevers, no rash.    Past Medical History  Diagnosis Date  . Coronary artery disease 09/04/08    single vessel CABG at time of AVR   . Aortic stenosis 09/04/08    s/p AVR by Dr Tyrone Sage (tissue valve)  . Paroxysmal atrial fibrillation   . LBBB (left bundle branch block)   . CHF (congestive heart failure)     diastolic  . Sleep apnea   . Alzheimer's dementia   . Diabetes mellitus     type 2  . Hypertension   . Chronic anemia   . Osteoporosis   . Chronic renal failure   . Morbid obesity with BMI of 50.0-59.9, adult 03/09/2012  . Peripheral vascular disease   . Arthritis   . Chronic diastolic heart failure 07/20/2013  . Chronic right ear pain     "nerve damage"   Past Surgical History  Procedure Laterality Date  . Coronary artery bypass graft  09/04/08    single vessel at time of AVR  . Aortic valve replacement  12/09    tissue valve by Dr Tyrone Sage  . Cholecystectomy  1995  . Cataract extraction    . Replacement total knee bilateral      L 2000, R 1997  . Appendectomy    . Closed reduction femoral shaft fracture  2007    R intramedullary rod  . Tee without cardioversion N/A 02/07/2013    Procedure: TRANSESOPHAGEAL ECHOCARDIOGRAM (TEE);  Surgeon: Donato Schultz, MD;  Location: [facility] ENDOSCOPY;  Service: Cardiovascular;  Laterality: N/A;   Family History  Problem Relation Age of Onset  . Other Father 70     deceased. multiple MI   . Lung cancer Mother 33    died  . Cancer Mother   . Heart attack Father    History  Substance Use Topics  . Smoking status: Never Smoker   . Smokeless tobacco: Never Used  . Alcohol Use: No    Review of Systems ROS: Statement: All systems negative except as marked or noted in the HPI; Constitutional: Negative for fever and chills. +generalized fatigue; ; Eyes: Negative for eye pain, redness and discharge. ; ; ENMT: +chronic right ear pain. Negative for hoarseness, nasal congestion, sinus pressure and sore throat. ; ; Cardiovascular: Negative for chest pain, palpitations, diaphoresis, dyspnea and peripheral edema. ; ; Respiratory: Negative for cough, wheezing and stridor. ; ; Gastrointestinal: Negative for nausea, vomiting, diarrhea, abdominal pain, blood in stool, hematemesis, jaundice and +rectal bleeding. . ; ; Genitourinary: Negative for dysuria, flank pain and hematuria. ; ; Musculoskeletal: Negative for back pain and neck pain. Negative for swelling and trauma.; ; Skin: Negative for pruritus, rash, abrasions, blisters, bruising and skin lesion.; ; Neuro: Negative for headache, lightheadedness and neck stiffness. Negative for altered level of consciousness , altered mental status, extremity weakness, paresthesias, involuntary movement, seizure and syncope.  Allergies  Morphine and related; Celecoxib; Codeine; Oxycodone; Wellbutrin; and Zyrtec  Home Medications   Current Outpatient Rx  Name  Route  Sig  Dispense  Refill  . acetaminophen (TYLENOL) 325 MG tablet   Oral   Take 162.5 mg by mouth every 6 (six) hours as needed for mild pain. For pain         . albuterol (PROVENTIL HFA;VENTOLIN HFA) 108 (90 BASE) MCG/ACT inhaler   Inhalation   Inhale 2 puffs into the lungs every 6 (six) hours as needed for wheezing.   1 Inhaler   2   . amiodarone (PACERONE) 100 MG tablet   Oral   Take 1 tablet (100 mg total) by mouth 2 (two) times daily.   60  tablet   12   . aspirin EC 81 MG tablet   Oral   Take 1 tablet (81 mg total) by mouth daily.   31 tablet   0   . diclofenac sodium (VOLTAREN) 1 % GEL   Topical   Apply 2 g topically as needed (pain).          Marland Kitchen docusate sodium (COLACE) 100 MG capsule   Oral   Take 1 capsule (100 mg total) by mouth 2 (two) times daily as needed. For constipation   30 capsule   0   . donepezil (ARICEPT) 10 MG tablet   Oral   Take 1 tablet (10 mg total) by mouth at bedtime.   30 tablet   0   . ferrous sulfate 325 (65 FE) MG tablet   Oral   Take 1 tablet (325 mg total) by mouth 2 (two) times daily.   60 tablet   0   . fish oil-omega-3 fatty acids 1000 MG capsule   Oral   Take 1 g by mouth daily.         . furosemide (LASIX) 80 MG tablet   Oral   Take 40-80 mg by mouth 2 (two) times daily. Takes 80 mg in the morning and 40 mg in the evening         . gabapentin (NEURONTIN) 600 MG tablet   Oral   Take 300-600 mg by mouth 2 (two) times daily. Take 1/2 tab in the morning and 1 tab at bedtime         . insulin glargine (LANTUS) 100 UNIT/ML injection   Subcutaneous   Inject 24 Units into the skin every evening.         . lactobacillus acidophilus (BACID) TABS tablet   Oral   Take 1 tablet by mouth daily.          . metoprolol (LOPRESSOR) 50 MG tablet   Oral   Take 1 tablet (50 mg total) by mouth 2 (two) times daily.   60 tablet   12   . Multiple Vitamin (MULTIVITAMIN) tablet   Oral   Take 1 tablet by mouth daily.          . Multiple Vitamins-Minerals (PRESERVISION AREDS PO)   Oral   Take 1 capsule by mouth daily.         Marland Kitchen PARoxetine (PAXIL) 40 MG tablet   Oral   Take 1 tablet (40 mg total) by mouth every morning.   30 tablet   1   . polyethylene glycol (MIRALAX / GLYCOLAX) packet   Oral   Take 17 g by mouth daily as needed.   14 each   0   . potassium chloride SA (K-DUR,KLOR-CON)  20 MEQ tablet   Oral   Take 20 mEq by mouth 3 (three) times daily.          . Rivaroxaban (XARELTO) 15 MG TABS tablet   Oral   Take 1 tablet (15 mg total) by mouth daily with supper.   30 tablet   0   . Sennosides (SENNA) 15 MG TABS   Oral   Take 1 tablet by mouth 2 (two) times daily. For constipation         . simvastatin (ZOCOR) 10 MG tablet   Oral   Take 1 tablet (10 mg total) by mouth at bedtime.   30 tablet   2     .Marland KitchenPatient needs to contact office to schedule  App ...   . spironolactone (ALDACTONE) 12.5 mg TABS tablet   Oral   Take 0.5 tablets (12.5 mg total) by mouth 2 (two) times daily.   60 tablet   6    BP 117/39  Pulse 42  Temp(Src) 97.5 F (36.4 C) (Oral)  Resp 14  SpO2 100% Physical Exam 1330: Physical examination:  Nursing notes reviewed; Vital signs and O2 SAT reviewed;  Constitutional: Well developed, Well nourished, Well hydrated, In no acute distress; Head:  Normocephalic, atraumatic; Eyes: EOMI, PERRL, No scleral icterus; ENMT: TM's clear bilat. No rash. Mouth and pharynx normal, Mucous membranes moist; Neck: Supple, Full range of motion, No lymphadenopathy; Cardiovascular: Regular rate and rhythm, No gallop; Respiratory: Breath sounds clear & equal bilaterally, No rales, rhonchi, wheezes.  Speaking full sentences with ease, Normal respiratory effort/excursion; Chest: Nontender, Movement normal; Abdomen: Soft, Nontender, Nondistended, Normal bowel sounds. Rectal exam performed w/permission of pt and ED RN chaperone present.  Anal tone normal.  Non-tender, soft maroon stool in rectal vault, heme positive.  No fissures, +external hemorrhoid without thrombosis or bleeding, no palp masses.;;; Genitourinary: No CVA tenderness; Extremities: Pulses normal, No tenderness, No edema, No calf edema or asymmetry.; Neuro: AA&Ox3, vague historian. Major CN grossly intact.  Speech clear. No gross focal motor or sensory deficits in extremities.; Skin: Color normal, Warm, Dry.   ED Course  Procedures   EKG Interpretation     Date/Time:  Monday September 10 2013 12:46:39 EST Ventricular Rate:  42 PR Interval:    QRS Duration: 148 QT Interval:  576 QTC Calculation: 480 R Axis:   -82 Text Interpretation:  Atrial fibrillation with slow ventricular response Non-specific intra-ventricular conduction block Left axis deviation Lateral infarct , age undetermined Anterior infarct , age undetermined Abnormal ECG When compared with ECG of 07/23/2013, Atrial fibrillation is now Present Confirmed by [facility]  MD, Nicholos Johns [phone]) on 09/10/2013 2:40:21 PM            MDM  MDM Reviewed: previous chart, nursing note and vitals Reviewed previous: labs and ECG Interpretation: labs, ECG and x-ray     Results for orders placed during the hospital encounter of 09/10/13  CBC WITH DIFFERENTIAL      Result Value Range   WBC 6.4  4.0 - 10.5 K/uL   RBC 3.70 (*) 3.87 - 5.11 MIL/uL   Hemoglobin 11.8 (*) 12.0 - 15.0 g/dL   HCT 96.0 (*) 45.4 - 09.8 %   MCV 94.3  78.0 - 100.0 fL   MCH 31.9  26.0 - 34.0 pg   MCHC 33.8  30.0 - 36.0 g/dL   RDW 11.9  14.7 - 82.9 %   Platelets 137 (*) 150 - 400 K/uL   Neutrophils Relative % 69  43 -  77 %   Neutro Abs 4.4  1.7 - 7.7 K/uL   Lymphocytes Relative 17  12 - 46 %   Lymphs Abs 1.1  0.7 - 4.0 K/uL   Monocytes Relative 12  3 - 12 %   Monocytes Absolute 0.7  0.1 - 1.0 K/uL   Eosinophils Relative 2  0 - 5 %   Eosinophils Absolute 0.1  0.0 - 0.7 K/uL   Basophils Relative 1  0 - 1 %   Basophils Absolute 0.0  0.0 - 0.1 K/uL  COMPREHENSIVE METABOLIC PANEL      Result Value Range   Sodium 136  135 - 145 mEq/L   Potassium 4.5  3.5 - 5.1 mEq/L   Chloride 98  96 - 112 mEq/L   CO2 27  19 - 32 mEq/L   Glucose, Bld 283 (*) 70 - 99 mg/dL   BUN 50 (*) 6 - 23 mg/dL   Creatinine, Ser 1.61 (*) 0.50 - 1.10 mg/dL   Calcium 9.0  8.4 - 09.6 mg/dL   Total Protein 7.2  6.0 - 8.3 g/dL   Albumin 3.4 (*) 3.5 - 5.2 g/dL   AST 22  0 - 37 U/L   ALT 13  0 - 35 U/L   Alkaline Phosphatase 100  39 - 117 U/L    Total Bilirubin 1.0  0.3 - 1.2 mg/dL   GFR calc non Af Amer 29 (*) >90 mL/min   GFR calc Af Amer 33 (*) >90 mL/min  PROTIME-INR      Result Value Range   Prothrombin Time 19.5 (*) 11.6 - 15.2 seconds   INR 1.70 (*) 0.00 - 1.49  URINALYSIS W MICROSCOPIC + REFLEX CULTURE      Result Value Range   Color, Urine YELLOW  YELLOW   APPearance CLEAR  CLEAR   Specific Gravity, Urine 1.009  1.005 - 1.030   pH 5.0  5.0 - 8.0   Glucose, UA NEGATIVE  NEGATIVE mg/dL   Hgb urine dipstick SMALL (*) NEGATIVE   Bilirubin Urine NEGATIVE  NEGATIVE   Ketones, ur NEGATIVE  NEGATIVE mg/dL   Protein, ur NEGATIVE  NEGATIVE mg/dL   Urobilinogen, UA 0.2  0.0 - 1.0 mg/dL   Nitrite NEGATIVE  NEGATIVE   Leukocytes, UA SMALL (*) NEGATIVE   WBC, UA 0-2  <3 WBC/hpf   RBC / HPF 0-2  <3 RBC/hpf   Bacteria, UA FEW (*) RARE   Squamous Epithelial / LPF FEW (*) RARE   Casts HYALINE CASTS (*) NEGATIVE  TROPONIN I      Result Value Range   Troponin I <0.30  <0.30 ng/mL  OCCULT BLOOD, POC DEVICE      Result Value Range   Fecal Occult Bld POSITIVE (*) NEGATIVE   Dg Chest 2 View 09/10/2013   CLINICAL DATA:  Coronary artery disease.  Congestive heart failure.  EXAM: CHEST  2 VIEW  COMPARISON:  07/23/2013.  FINDINGS: Cardiomegaly. Prior CABG. Bilateral airspace opacity likely congestive heart failure although bilateral pneumonia not excluded. Significant worsening aeration from priors. Calcified tortuous aorta.  IMPRESSION: Cardiomegaly with suspected congestive heart failure.   Electronically Signed   By: Davonna Belling M.D.   On: 09/10/2013 14:19    Results for VIOLA, KINNICK (MRN [id number]) as of 09/10/2013 16:03  Ref. Range 07/24/2013 05:15 07/25/2013 05:41 08/20/2013 16:24 09/04/2013 15:56 09/10/2013 13:25  BUN Latest Range: 6-23 mg/dL 20 38 (H) 25 (H) 24 (H) 50 (H)  Creatinine Latest Range: 0.50-1.10 mg/dL 9.14  1.24 (H) 1.2 1.4 (H) 1.64 (H)  Results for KALIS, FRIESE (MRN [id number]) as of 09/10/2013 16:03  Ref.  Range 03/14/2013 04:27 07/23/2013 19:05 07/24/2013 05:15 07/25/2013 05:41 09/10/2013 13:25  Hemoglobin Latest Range: 12.0-15.0 g/dL 14.7 (L) 82.9 56.2 13.0 11.8 (L)  HCT Latest Range: 36.0-46.0 % 33.1 (L) 43.2 42.6 39.5 34.9 (L)  Platelets Latest Range: 150-400 K/uL 106 (L) 99 (L) 91 (L) 94 (L) 137 (L)    1505:  SBP on arrival 99; improved to 119 after judicious IVF bolus. No active rectal bleeding while in the ED.  H/H lower than baseline. Dx and testing d/w pt and family.  Questions answered.  Verb understanding, agreeable to admit.  T/C to [facility] Dr. York Spaniel, case discussed, including:  HPI, pertinent PM/SHx, VS/PE, dx testing, ED course and treatment:  Agreeable to admit, requests to write temporary orders, obtain stepdown bed to team 10.   Laray Anger, DO 09/12/13 [phone]

## 2013-09-11 DIAGNOSIS — K922 Gastrointestinal hemorrhage, unspecified: Secondary | ICD-10-CM

## 2013-09-11 DIAGNOSIS — I5042 Chronic combined systolic (congestive) and diastolic (congestive) heart failure: Secondary | ICD-10-CM

## 2013-09-11 DIAGNOSIS — I4891 Unspecified atrial fibrillation: Secondary | ICD-10-CM

## 2013-09-11 DIAGNOSIS — I498 Other specified cardiac arrhythmias: Secondary | ICD-10-CM

## 2013-09-11 LAB — CBC
HCT: 33.1 % — ABNORMAL LOW (ref 36.0–46.0)
HCT: 33.5 % — ABNORMAL LOW (ref 36.0–46.0)
Hemoglobin: 10.9 g/dL — ABNORMAL LOW (ref 12.0–15.0)
MCH: 31.9 pg (ref 26.0–34.0)
MCH: 31.5 pg (ref 26.0–34.0)
MCHC: 32.9 g/dL (ref 30.0–36.0)
MCHC: 32.2 g/dL (ref 30.0–36.0)
MCV: 96.8 fL (ref 78.0–100.0)
MCV: 97.7 fL (ref 78.0–100.0)
Platelets: 130 10*3/uL — ABNORMAL LOW (ref 150–400)
Platelets: 129 10*3/uL — ABNORMAL LOW (ref 150–400)
RBC: 3.42 MIL/uL — ABNORMAL LOW (ref 3.87–5.11)
RBC: 3.43 MIL/uL — ABNORMAL LOW (ref 3.87–5.11)
RDW: 16 % — ABNORMAL HIGH (ref 11.5–15.5)
WBC: 8 10*3/uL (ref 4.0–10.5)

## 2013-09-11 LAB — GLUCOSE, CAPILLARY
Glucose-Capillary: 80 mg/dL (ref 70–99)
Glucose-Capillary: 81 mg/dL (ref 70–99)
Glucose-Capillary: 119 mg/dL — ABNORMAL HIGH (ref 70–99)
Glucose-Capillary: 196 mg/dL — ABNORMAL HIGH (ref 70–99)

## 2013-09-11 LAB — BASIC METABOLIC PANEL
BUN: 41 mg/dL — ABNORMAL HIGH (ref 6–23)
CO2: 27 mEq/L (ref 19–32)
Calcium: 8.8 mg/dL (ref 8.4–10.5)
Chloride: 103 mEq/L (ref 96–112)
GFR calc non Af Amer: 33 mL/min — ABNORMAL LOW (ref >90)
Glucose, Bld: 81 mg/dL (ref 70–99)
Potassium: 3.1 mEq/L — ABNORMAL LOW (ref 3.5–5.1)
Sodium: 141 mEq/L (ref 135–145)

## 2013-09-11 LAB — HEMOGLOBIN AND HEMATOCRIT, BLOOD
HCT: 30.8 % — ABNORMAL LOW (ref 36.0–46.0)
Hemoglobin: 10.3 g/dL — ABNORMAL LOW (ref 12.0–15.0)

## 2013-09-11 MED ORDER — WHITE PETROLATUM GEL
Status: AC
  Filled 2013-09-11: qty 5

## 2013-09-11 MED ORDER — HYDROCORTISONE ACETATE 25 MG RE SUPP
25.0000 mg | Freq: Two times a day (BID) | RECTAL | Status: DC
  Administered 2013-09-11 – 2013-09-17 (×12): 25 mg via RECTAL
  Filled 2013-09-11 (×13): qty 1

## 2013-09-11 MED ORDER — POTASSIUM CHLORIDE CRYS ER 20 MEQ PO TBCR
40.0000 meq | EXTENDED_RELEASE_TABLET | Freq: Two times a day (BID) | ORAL | Status: DC
  Administered 2013-09-11 – 2013-09-12 (×3): 40 meq via ORAL
  Filled 2013-09-11 (×4): qty 2

## 2013-09-11 MED ORDER — INSULIN ASPART 100 UNIT/ML ~~LOC~~ SOLN
0.0000 [IU] | Freq: Three times a day (TID) | SUBCUTANEOUS | Status: DC
  Administered 2013-09-11 – 2013-09-12 (×2): 2 [IU] via SUBCUTANEOUS
  Administered 2013-09-12: 3 [IU] via SUBCUTANEOUS
  Administered 2013-09-12 – 2013-09-13 (×2): 2 [IU] via SUBCUTANEOUS

## 2013-09-11 NOTE — Progress Notes (Signed)
HR continues to sustain in the high 30s, BP 111/63. Maren Reamer notified. Order received to keep atropine at the bedside. Will continue to monitor.   Rochele Pages, RN

## 2013-09-11 NOTE — Progress Notes (Signed)
[facility] Progress Note Intercourse TEAM 1 - Stepdown/ICU TEAM   [name] [facility] ZOX:[id number] DOB: 14-Feb-1933 DOA: 09/10/2013 PCP: [name], MD  Brief narrative: 79 y/o female who lives with her daughter and has a h/o IDDM, HTN, CAD, PAF on Xarelto presented with BRBPR with dizziness, dyspnea and diaphoresis She was noted to be bradycardic in there ER and and her Aricept, B Blocker and Amiodarone were held.    Assessment/Plan: Principal Problem:   GI bleeding (likley lower) with hemorrhoids/ Anemia - No further procedures are planned at this time per GI- pt also not interested in colonoscopy - It is supected that bleeding may have been hemorrhoidal in setting of Xarelto.  - cont to follow Hgb as well - has had a 1 g drop -  start Anusol  - Active Problems:   ALZHEIMERS DISEASE - aricept on hold due to bradycardia    Bradycardia- paroxysmal atrial fibrillation  - hold Xarelto due to bleed- pt will be started on Eliquis per Cards once OK with GI - holding Metoprolol and Amiodarone - no need for pacer at this point as she is asymptomatic  - start PT and follow for symptoms    Acute blood loss anemia - Hgb dropped from 13 to 10-11 range- cont to follow    DM (diabetes mellitus) - Lantus on hold as she was NPO - have resumed diet- will start lower dose Lantus tonight - cont sliding scale with meals  Thrombocytopenia - mild and chronic   Code Status: full code Family Communication: daughter  Disposition Plan: snf vs home with 24 hr supervision   Consultants: cardiology GI  Procedures: none  Antibiotics: none  DVT prophylaxis: SCDs  HPI/Subjective: Pt alert- feels quite weak. Not sure if she is still having bloody BMs.    Objective: Blood pressure 111/42, pulse 67, temperature 98.1 F (36.7 C), temperature source Oral, resp. rate 15, height 5\' 3"  (1.6 m), weight 107.5 kg (236 lb 15.9 oz), SpO2 100.00%.  Intake/Output Summary (Last 24 hours)  at 09/11/13 1355 Last data filed at 09/11/13 0900  Gross per 24 hour  Intake    750 ml  Output   1425 ml  Net   -675 ml     Exam: General: No acute respiratory distress Lungs: Clear to auscultation bilaterally without wheezes or crackles Cardiovascular: Regular rate and rhythm without murmur - rate in 40s and then increased to 70s  Abdomen: Nontender, nondistended, soft, bowel sounds positive, no rebound, no ascites, no appreciable mass Extremities: No significant cyanosis, clubbing, or edema bilateral lower extremities  Data Reviewed: Basic Metabolic Panel:  Recent Labs Lab 09/04/13 1556 09/10/13 1325 09/11/13 0510  NA 137 136 141  K 4.8 4.5 3.1*  CL 101 98 103  CO2 27 27 27   GLUCOSE 214* 283* 81  BUN 24* 50* 41*  CREATININE 1.4* 1.64* 1.48*  CALCIUM 9.0 9.0 8.8   Liver Function Tests:  Recent Labs Lab 09/10/13 1325  AST 22  ALT 13  ALKPHOS 100  BILITOT 1.0  PROT 7.2  ALBUMIN 3.4*   No results found for this basename: LIPASE, AMYLASE,  in the last 168 hours No results found for this basename: AMMONIA,  in the last 168 hours CBC:  Recent Labs Lab 09/10/13 1325 09/10/13 1707 09/10/13 2240 09/11/13 0510 09/11/13 1055  WBC 6.4  --   --  8.0  --   NEUTROABS 4.4  --   --   --   --   HGB  11.8* 11.2* 10.2* 10.9* 10.3*  HCT 34.9* 34.3* 31.4* 33.1* 30.8*  MCV 94.3  --   --  96.8  --   PLT 137*  --   --  130*  --    Cardiac Enzymes:  Recent Labs Lab 09/10/13 1325  TROPONINI <0.30   BNP (last 3 results)  Recent Labs  02/04/13 1050 03/13/13 1345 07/23/13 1905  PROBNP 23347.0* 6455.0* 6746.0*   CBG:  Recent Labs Lab 09/10/13 2342 09/11/13 0031 09/11/13 0430 09/11/13 0751 09/11/13 1144  GLUCAP 72 72 80 81 119*    Recent Results (from the past 240 hour(s))  MRSA PCR SCREENING     Status: None   Collection Time    09/10/13  6:20 PM      Result Value Range Status   MRSA by PCR NEGATIVE  NEGATIVE Final   Comment:            The GeneXpert  MRSA Assay (FDA     approved for NASAL specimens     only), is one component of a     comprehensive MRSA colonization     surveillance program. It is not     intended to diagnose MRSA     infection nor to guide or     monitor treatment for     MRSA infections.     Studies:  Recent x-ray studies have been reviewed in detail by the Attending Physician  Scheduled Meds:  Scheduled Meds: . fluconazole  100 mg Oral Once  . gabapentin  300 mg Oral Daily  . gabapentin  600 mg Oral QHS  . insulin aspart  0-9 Units Subcutaneous TID WC  . nystatin   Topical BID  . PARoxetine  40 mg Oral Daily  . potassium chloride  40 mEq Oral BID  . sodium chloride  3 mL Intravenous Q12H  . white petrolatum       Continuous Infusions:   Time spent on care of this patient: 35 min   Stacy Lecy, MD  [facility] Office  [phone] Pager - Text Page per Loretha Stapler as per below:  On-Call/Text Page:      [email or web address]      password [email or web address]  If 7PM-7AM, please contact night-coverage [email or web address] Password [email or web address] 09/11/2013, 1:55 PM   LOS: 1 day     I have examined the patient, reviewed the chart and modified the above note which I agree with.   Stacy Tesmer,MD [phone] 09/11/2013, 6:24 PM

## 2013-09-11 NOTE — Consult Note (Signed)
ELECTROPHYSIOLOGY CONSULT NOTE    Patient ID: AIREN DALES MRN: [id number], DOB/AGE: [birth year] 77 y.o.  Admit date: 09/10/2013 Date of Consult: 09-11-2013  Primary Physician: [name], MD Primary Cardiologist: Donato Schultz, MD Electrophysiologist: Hillis Range, MD  Reason for Consultation: bradycardia  HPI:  [name] is a 79 y.o. female with a past medical history of coronary disease (s/p CABG in 2009 with AVR at the same time), atrial arrhythmias, LBBB, diastolic heart failure, sleep apnea, diabetes, hypertension, and obesity.  She has been followed closely by Dr Anne Fu for several years.  She developed BRBPR yesterday and presented to [facility] for further evaluation. She has been seen by GI who felt that her bleeding was from hemorrhoids.  The patient declined further GI work up at this time.    She has been found to be bradycardic this admission with sinus rates in the 30's as well as junctional rhythm.  She is on Amiodorone 100mg  daily as well as Metoprolol 50mg  twice daily at home for her atrial arrhythmias.  She is asymptomatic with her bradycardia specifically denying dizziness, pre-syncope, or syncope.    She was last seen by EP in 12/2010 for bradycardia.  At that time, due to paucity of symptoms as well as the patient's desire to avoid procedures, pacemaker implantation was not pursued.  She did have significant nocturnal bradycardia that was felt to be related to her sleep apnea but she was not inclined at that point to use CPAP.  Her Amiodarone was decreased to 100mg  daily at that time.   TEE done 01/2013 demonstrated an EF of 45-50%, normally functioning aortic valve, normal LA size (no measurement).  She lives with her daughter who is very involved with her care.  She denies tobacco, alcohol, or recreational drug use.  She is limited in her mobility by her knees and ambulates with a walker.   EP has been asked to evaluate for treatment options.  ROS is negative  except as outlined above.    Past Medical History  Diagnosis Date  . Coronary artery disease 09/04/08    single vessel CABG at time of AVR   . Aortic stenosis 09/04/08    s/p AVR by Dr Tyrone Sage (tissue valve)  . Paroxysmal atrial fibrillation   . LBBB (left bundle branch block)   . CHF (congestive heart failure)     diastolic  . Sleep apnea   . Alzheimer's dementia   . Diabetes mellitus     type 2  . Hypertension   . Chronic anemia   . Osteoporosis   . Chronic renal failure   . Morbid obesity with BMI of 50.0-59.9, adult 03/09/2012  . Peripheral vascular disease   . Arthritis   . Chronic diastolic heart failure 07/20/2013  . Chronic right ear pain     "nerve damage"     Surgical History:  Past Surgical History  Procedure Laterality Date  . Coronary artery bypass graft  09/04/08    single vessel at time of AVR  . Aortic valve replacement  12/09    tissue valve by Dr Tyrone Sage  . Cholecystectomy  1995  . Cataract extraction    . Replacement total knee bilateral      L 2000, R 1997  . Appendectomy    . Closed reduction femoral shaft fracture  2007    R intramedullary rod  . Tee without cardioversion N/A 02/07/2013    Procedure: TRANSESOPHAGEAL ECHOCARDIOGRAM (TEE);  Surgeon: Donato Schultz, MD;  Location: [facility]  ENDOSCOPY;  Service: Cardiovascular;  Laterality: N/A;     Prescriptions prior to admission  Medication Sig Dispense Refill  . acetaminophen (TYLENOL) 325 MG tablet Take 162.5 mg by mouth every 6 (six) hours as needed for mild pain. For pain      . albuterol (PROVENTIL HFA;VENTOLIN HFA) 108 (90 BASE) MCG/ACT inhaler Inhale 2 puffs into the lungs every 6 (six) hours as needed for wheezing.  1 Inhaler  2  . amiodarone (PACERONE) 100 MG tablet Take 1 tablet (100 mg total) by mouth 2 (two) times daily.  60 tablet  12  . aspirin EC 81 MG tablet Take 1 tablet (81 mg total) by mouth daily.  31 tablet  0  . diclofenac sodium (VOLTAREN) 1 % GEL Apply 2 g topically as needed  (pain).       Marland Kitchen docusate sodium (COLACE) 100 MG capsule Take 1 capsule (100 mg total) by mouth 2 (two) times daily as needed. For constipation  30 capsule  0  . donepezil (ARICEPT) 10 MG tablet Take 1 tablet (10 mg total) by mouth at bedtime.  30 tablet  0  . ferrous sulfate 325 (65 FE) MG tablet Take 1 tablet (325 mg total) by mouth 2 (two) times daily.  60 tablet  0  . fish oil-omega-3 fatty acids 1000 MG capsule Take 1 g by mouth daily.      . furosemide (LASIX) 80 MG tablet Take 40-80 mg by mouth 2 (two) times daily. Takes 80 mg in the morning and 40 mg in the evening      . gabapentin (NEURONTIN) 600 MG tablet Take 300-600 mg by mouth 2 (two) times daily. Take 1/2 tab in the morning and 1 tab at bedtime      . insulin glargine (LANTUS) 100 UNIT/ML injection Inject 24 Units into the skin every evening.      . lactobacillus acidophilus (BACID) TABS tablet Take 1 tablet by mouth daily.       . metoprolol (LOPRESSOR) 50 MG tablet Take 1 tablet (50 mg total) by mouth 2 (two) times daily.  60 tablet  12  . Multiple Vitamin (MULTIVITAMIN) tablet Take 1 tablet by mouth daily.       . Multiple Vitamins-Minerals (PRESERVISION AREDS PO) Take 1 capsule by mouth daily.      Marland Kitchen PARoxetine (PAXIL) 40 MG tablet Take 1 tablet (40 mg total) by mouth every morning.  30 tablet  1  . polyethylene glycol (MIRALAX / GLYCOLAX) packet Take 17 g by mouth daily as needed.  14 each  0  . potassium chloride SA (K-DUR,KLOR-CON) 20 MEQ tablet Take 20 mEq by mouth 3 (three) times daily.      . Rivaroxaban (XARELTO) 15 MG TABS tablet Take 1 tablet (15 mg total) by mouth daily with supper.  30 tablet  0  . Sennosides (SENNA) 15 MG TABS Take 1 tablet by mouth 2 (two) times daily. For constipation      . simvastatin (ZOCOR) 10 MG tablet Take 1 tablet (10 mg total) by mouth at bedtime.  30 tablet  2  . spironolactone (ALDACTONE) 12.5 mg TABS tablet Take 0.5 tablets (12.5 mg total) by mouth 2 (two) times daily.  60 tablet  6     Inpatient Medications:  . fluconazole  100 mg Oral Once  . gabapentin  300 mg Oral Daily  . gabapentin  600 mg Oral QHS  . insulin aspart  0-9 Units Subcutaneous Q4H  . nystatin  Topical BID  . pantoprazole (PROTONIX) IV  40 mg Intravenous Q12H  . PARoxetine  40 mg Oral Daily  . potassium chloride  40 mEq Oral BID  . sodium chloride  3 mL Intravenous Q12H    Allergies:  Allergies  Allergen Reactions  . Morphine And Related Other (See Comments)    "went crazy"-Hallucinations  . Celecoxib Other (See Comments)    Sore mouth  . Codeine   . Oxycodone   . Wellbutrin [Bupropion] Other (See Comments)    Hallucinations  . Zyrtec [Cetirizine] Rash    History   Social History  . Marital Status: Widowed    Spouse Name: N/A    Number of Children: N/A  . Years of Education: N/A   Occupational History  . retired     lives with her daughter   Social History Main Topics  . Smoking status: Never Smoker   . Smokeless tobacco: Never Used  . Alcohol Use: No  . Drug Use: No  . Sexual Activity: No   Other Topics Concern  . Not on file   Social History Narrative   Lives in [place] with daughter, [name], who is an Char[employer] fundraiser.     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   Widowed 2003     Family History  Problem Relation Age of Onset  . Other Father 82    deceased. multiple MI   . Lung cancer Mother 14    died  . Cancer Mother   . Heart attack Father     Physical Exam: Filed Vitals:   09/11/13 0342 09/11/13 0753 09/11/13 0800 09/11/13 0900  BP: 117/65   120/42  Pulse: 40  65 67  Temp: 97.6 F (36.4 C) 98 F (36.7 C)    TempSrc: Oral Oral    Resp: 13  12 15   Height:      Weight: 236 lb 15.9 oz (107.5 kg)     SpO2: 98%  91% 100%    GEN- The patient is elderly appearing, alert and oriented x 3 today.   Head- normocephalic, atraumatic Eyes-  Sclera clear, conjunctiva pink Ears- hearing intact Oropharynx- clear Neck- supple,  Lungs- Clear to  ausculation bilaterally, normal work of breathing Heart- Regular rate and rhythm  GI- soft, NT, ND, + BS Extremities- no clubbing, cyanosis, or edema MS- diffuse muscle atrophy Skin- no rash or lesion  Labs:   Lab Results  Component Value Date   WBC 8.0 09/11/2013   HGB 10.9* 09/11/2013   HCT 33.1* 09/11/2013   MCV 96.8 09/11/2013   PLT 130* 09/11/2013    Recent Labs Lab 09/10/13 1325 09/11/13 0510  NA 136 141  K 4.5 3.1*  CL 98 103  CO2 27 27  BUN 50* 41*  CREATININE 1.64* 1.48*  CALCIUM 9.0 8.8  PROT 7.2  --   BILITOT 1.0  --   ALKPHOS 100  --   ALT 13  --   AST 22  --   GLUCOSE 283* 81    Radiology/Studies: Dg Chest 2 View 09/10/2013   CLINICAL DATA:  Coronary artery disease.  Congestive heart failure.  EXAM: CHEST  2 VIEW  COMPARISON:  07/23/2013.  FINDINGS: Cardiomegaly. Prior CABG. Bilateral airspace opacity likely congestive heart failure although bilateral pneumonia not excluded. Significant worsening aeration from priors. Calcified tortuous aorta.  IMPRESSION: Cardiomegaly with suspected congestive heart failure.   Electronically Signed   By: Davonna Belling M.D.   On: 09/10/2013  14:19   EKG: junctional rhythm, rate 42, QRS 148 with occasional sinus breats  TELEMETRY: sinus bradycardia with PAC's, rate 30's with intermittent junctional rhythm  Assessment and Plan:  1. Sinus bradycardia The patient has longstanding bradycardia.  I am not convince that she is symptomatic at this time.  She has occasional postural dizziness but otherwise is mostly limited by arthritis.  I saw her in 12/2010 for the exact same symptoms.  She declined pacing at that time and has done reasonable well in the interim.  Her metoprolol has been discontinued today.   Today I had a long discussion with the patient regarding options. We discussed risks, benefits, and alternatives to pacemaker implantation. She is very clear in her decision to avoid PPM at this time. If she develops symptoms of  progressive dizziness, presyncope, or syncope, then she will reconsider.  I think that it would be reasonable to stop metoprolol and use this medicine only as needed for tachypalpitations.  Continue low dose amiodarone. We will ask PT to ambulate and assess heart rate response to ambulation/ exercise.  2. afib Appears to be well controlled with amiodarone She is presently off of anticoagulation due to bleeding.  Her CHADS2VASC score is at least 6 and therefore her annual risk for stroke is elevated. As her anemia is stable, I think that it might be reasonable to restart anticoagulation when cleared by GI. Eliquis may be a better option as GI bleeding risks were not significantly higher with eliquis than coumadin in the Aristotle study.  (Rocket AF showed increased GI bleeding risks with xarelto when compared to coumadin).

## 2013-09-11 NOTE — Evaluation (Signed)
Physical Therapy Evaluation Patient Details Name: [name] MRN: [id number] DOB: 1933/09/01 Today's Date: 09/11/2013 Time: 1530-1610 PT Time Calculation (min): 40 min  PT Assessment / Plan / Recommendation History of Present Illness  Pt admitted with rectal bleeding and low HR  Clinical Impression  Pt ambulation limited by significant bright red rectal bleeding. RN called to assess. Pt HR between 48-68 during amb to bathroom. Pt with significant falls risk and is unsafe to be home alone at this time. Daughter agrees however pt strongly desires to return home. Recommending SNF upon d/c unless 24/7 supervision can be provided.    PT Assessment  Patient needs continued PT services    Follow Up Recommendations  SNF;Supervision/Assistance - 24 hour    Does the patient have the potential to tolerate intense rehabilitation      Barriers to Discharge Decreased caregiver support (alone during the day)      Equipment Recommendations  None recommended by PT    Recommendations for Other Services     Frequency Min 3X/week    Precautions / Restrictions Precautions Precautions: Fall Restrictions Weight Bearing Restrictions: No   Pertinent Vitals/Pain Denies pain      Mobility  Bed Mobility Bed Mobility: Supine to Sit Supine to Sit: 4: Min assist;With rails;HOB flat Details for Bed Mobility Assistance: assist for trunk elevation Transfers Transfers: Sit to Stand;Stand to Sit Sit to Stand: 4: Min assist;With upper extremity assist;From bed Stand to Sit: 4: Min guard;With upper extremity assist;To chair/3-in-1 Details for Transfer Assistance: v/c's for safe hand placement, increased time Ambulation/Gait Ambulation/Gait Assistance: 4: Min assist Ambulation Distance (Feet): 25 Feet (to bathroom) Assistive device: Rolling walker Ambulation/Gait Assistance Details: mild SOB, HR 48-68, slow, taxing effore, increased trunk flexion Gait Pattern: Step-to pattern;Decreased stride  length Gait velocity: extremely slow Stairs: No    Exercises     PT Diagnosis: Difficulty walking;Generalized weakness  PT Problem List: Decreased strength;Decreased activity tolerance;Decreased balance;Decreased mobility PT Treatment Interventions: DME instruction;Gait training;Stair training;Therapeutic exercise;Therapeutic activities;Functional mobility training     PT Goals(Current goals can be found in the care plan section) Acute Rehab PT Goals Patient Stated Goal: home PT Goal Formulation: With patient Time For Goal Achievement: 09/25/13 Potential to Achieve Goals: Good  Visit Information  Last PT Received On: 09/11/13 Assistance Needed: +1 History of Present Illness: Pt admitted with rectal bleeding and low HR       Prior Functioning  Home Living Family/patient expects to be discharged to:: Private residence Living Arrangements: Children Available Help at Discharge: Available PRN/intermittently Type of Home: House Home Access: Ramped entrance Home Layout: One level Home Equipment: Hand held shower head;Toilet riser;Tub bench;Walker - 4 wheels;Bedside commode;Other (comment) Additional Comments: pt with HH aide 8-10 hours a week to assist with bathing, family works during the day Prior Function Level of Independence: Needs assistance Gait / Transfers Assistance Needed: uses RW ADL's / Homemaking Assistance Needed: pt takes bird bath, able to make self something to eat, assist for actual shower Communication Communication:  (deaf in R ear) Dominant Hand: Right    Cognition  Cognition Arousal/Alertness: Awake/alert Behavior During Therapy: WFL for tasks assessed/performed Overall Cognitive Status: Impaired/Different from baseline Area of Impairment:  (decreased insight to falls risk, wants to be very independent)    Extremity/Trunk Assessment Upper Extremity Assessment Upper Extremity Assessment: Generalized weakness Lower Extremity Assessment Lower Extremity  Assessment: Generalized weakness Cervical / Trunk Assessment Cervical / Trunk Assessment: Normal   Balance Balance Balance Assessed: Yes Static Standing Balance Static  Standing - Balance Support: Bilateral upper extremity supported Static Standing - Level of Assistance: 5: Stand by assistance Static Standing - Comment/# of Minutes: 2 min for hygiene for rectal bleecing  End of Session PT - End of Session Equipment Utilized During Treatment: Gait belt;Oxygen (2LO2 via [place]) Activity Tolerance: Patient limited by fatigue (limited by rectal bleeding) Patient left: in chair;with call bell/phone within reach;with family/visitor present Nurse Communication: Mobility status (rectal bleeding)  GP     Laurian Edrington Marie 09/11/2013, 4:10 PM  Lewis Shock, PT, DPT Pager #: [phone] Office #: [phone]

## 2013-09-11 NOTE — Progress Notes (Signed)
Subjective: No complaints.  No further hematochezia.  Objective: Vital signs in last 24 hours: Temp:  [97.5 F (36.4 C)-98.3 F (36.8 C)] 97.6 F (36.4 C) (12/16 0342) Pulse Rate:  [30-42] 40 (12/16 0342) Resp:  [13-20] 13 (12/16 0342) BP: (98-124)/(35-84) 117/65 mmHg (12/16 0342) SpO2:  [92 %-100 %] 98 % (12/16 0342) Weight:  [229 lb 4.5 oz (104 kg)-236 lb 15.9 oz (107.5 kg)] 236 lb 15.9 oz (107.5 kg) (12/16 0342)    Intake/Output from previous day: 12/15 0701 - 12/16 0700 In: 650 [I.V.:650] Out: 1425 [Urine:1425] Intake/Output this shift:    General appearance: alert and no distress GI: soft, non-tender; bowel sounds normal; no masses,  no organomegaly  Lab Results:  Recent Labs  09/10/13 1325 09/10/13 1707 09/10/13 2240 09/11/13 0510  WBC 6.4  --   --  8.0  HGB 11.8* 11.2* 10.2* 10.9*  HCT 34.9* 34.3* 31.4* 33.1*  PLT 137*  --   --  130*   BMET  Recent Labs  09/10/13 1325 09/11/13 0510  NA 136 141  K 4.5 3.1*  CL 98 103  CO2 27 27  GLUCOSE 283* 81  BUN 50* 41*  CREATININE 1.64* 1.48*  CALCIUM 9.0 8.8   LFT  Recent Labs  09/10/13 1325  PROT 7.2  ALBUMIN 3.4*  AST 22  ALT 13  ALKPHOS 100  BILITOT 1.0   PT/INR  Recent Labs  09/10/13 1325  LABPROT 19.5*  INR 1.70*   Hepatitis Panel No results found for this basename: HEPBSAG, HCVAB, HEPAIGM, HEPBIGM,  in the last 72 hours C-Diff No results found for this basename: CDIFFTOX,  in the last 72 hours Fecal Lactopherrin No results found for this basename: FECLLACTOFRN,  in the last 72 hours  Studies/Results: Dg Chest 2 View  09/10/2013   CLINICAL DATA:  Coronary artery disease.  Congestive heart failure.  EXAM: CHEST  2 VIEW  COMPARISON:  07/23/2013.  FINDINGS: Cardiomegaly. Prior CABG. Bilateral airspace opacity likely congestive heart failure although bilateral pneumonia not excluded. Significant worsening aeration from priors. Calcified tortuous aorta.  IMPRESSION: Cardiomegaly with  suspected congestive heart failure.   Electronically Signed   By: Davonna Belling M.D.   On: 09/10/2013 14:19    Medications:  Scheduled: . fluconazole  100 mg Oral Once  . gabapentin  300 mg Oral Daily  . gabapentin  600 mg Oral QHS  . insulin aspart  0-9 Units Subcutaneous Q4H  . nystatin   Topical BID  . pantoprazole (PROTONIX) IV  40 mg Intravenous Q12H  . PARoxetine  40 mg Oral Daily  . sodium chloride  3 mL Intravenous Q12H   Continuous: . dextrose 5 % and 0.9% NaCl 50 mL/hr at 09/10/13 1841    Assessment/Plan: 1) Hematochezia. 2) Probable hemorrhoidal bleeding. 3) Severe bradycardia.   The patient's HGB is stable.  Her clinical status is too poor to pursue any intervention and her daughter does not feel that any intervention should be pursued.  This was outlined in Dr. Kenna Gilbert note yesterday.  I concur.  The risks far outweigh the benefits.  Plan: 1) Monitor CBC. 2) Transfuse as necessary. 3) No GI intervention.   4) Signing off.   LOS: 1 day   Jennica Tagliaferri D 09/11/2013, 7:35 AM

## 2013-09-11 NOTE — Progress Notes (Signed)
Pt had rectal bleed during PT session. BP stable. Text/paged attending. Will continue to monitor.

## 2013-09-11 NOTE — Progress Notes (Addendum)
Subjective:  No complaints. No bleeding. No syncope. No dizziness or SOB.   Objective:  Vital Signs in the last 24 hours: Temp:  [97.5 F (36.4 C)-98.3 F (36.8 C)] 98 F (36.7 C) (12/16 0753) Pulse Rate:  [30-42] 40 (12/16 0342) Resp:  [13-20] 13 (12/16 0342) BP: (98-124)/(35-84) 117/65 mmHg (12/16 0342) SpO2:  [92 %-100 %] 98 % (12/16 0342) Weight:  [229 lb 4.5 oz (104 kg)-236 lb 15.9 oz (107.5 kg)] 236 lb 15.9 oz (107.5 kg) (12/16 0342)  Intake/Output from previous day: 12/15 0701 - 12/16 0700 In: 650 [I.V.:650] Out: 1425 [Urine:1425]   Physical Exam: General: Well developed, well nourished, in no acute distress. Head:  Normocephalic and atraumatic. Lungs: Clear to auscultation and percussion. Heart: Huston Foley reg.  No murmur, rubs or gallops.  Abdomen: soft, non-tender, positive bowel sounds. Extremities: No clubbing or cyanosis. No edema. Neurologic: Alert and oriented x 3.    Lab Results:  Recent Labs  09/10/13 1325  09/10/13 2240 09/11/13 0510  WBC 6.4  --   --  8.0  HGB 11.8*  < > 10.2* 10.9*  PLT 137*  --   --  130*  < > = values in this interval not displayed.  Recent Labs  09/10/13 1325 09/11/13 0510  NA 136 141  K 4.5 3.1*  CL 98 103  CO2 27 27  GLUCOSE 283* 81  BUN 50* 41*  CREATININE 1.64* 1.48*    Recent Labs  09/10/13 1325  TROPONINI <0.30   Hepatic Function Panel  Recent Labs  09/10/13 1325  PROT 7.2  ALBUMIN 3.4*  AST 22  ALT 13  ALKPHOS 100  BILITOT 1.0    Imaging: Dg Chest 2 View  09/10/2013   CLINICAL DATA:  Coronary artery disease.  Congestive heart failure.  EXAM: CHEST  2 VIEW  COMPARISON:  07/23/2013.  FINDINGS: Cardiomegaly. Prior CABG. Bilateral airspace opacity likely congestive heart failure although bilateral pneumonia not excluded. Significant worsening aeration from priors. Calcified tortuous aorta.  IMPRESSION: Cardiomegaly with suspected congestive heart failure.   Electronically Signed   By: Davonna Belling  M.D.   On: 09/10/2013 14:19   Personally viewed.   Telemetry: Sinus brady 30's, PAC occasional.  Personally viewed.   Assessment/Plan:  Active Problems:   ALZHEIMERS DISEASE   PAROXYSMAL ATRIAL FIBRILLATION   GIB (gastrointestinal bleeding)   Acute blood loss anemia   DM (diabetes mellitus)   Bradycardia   GI bleeding  1) Severe bradycardia - sinus. Admitted 09/10/13. Washing out metoprolol 50mg  BID as well as amiodarone 100mg  PO QD. Was being treated for PAF as well as atach (was persistent 110 previously). I will discuss with EP. If heart rate does not increase today likely will need pacemaker, permanent. Currently hemodynamically stable. BP stable. In bed. Concerned that her atach or AFIB with RVR may return and our pharmacologic therapy would be limited.   2) Chronic diastolic HF - holding lasix and aldactone currently. No SOB. No orthopnea. Monitor. Hold diuretic because of low normal BP.   3) GIB - stable. Probable hemorrhoidal bleeding  4) AVR - bioprosthetic  5) CABG - no angina, stable  6) Morbid obesity - weight loss  7) CKD 3 - stable.   8) History of hypokalemia  - replete today.  BID. (Aldactone is being held)  9) PAF - was on Xarelto. Now off with GIB. Currently SB.   Dementia is written as dx but this is mild.  SKAINS, MARK 09/11/2013, 8:02 AM

## 2013-09-11 NOTE — Progress Notes (Signed)
Utilization review completed.  

## 2013-09-12 LAB — BASIC METABOLIC PANEL
BUN: 34 mg/dL — ABNORMAL HIGH (ref 6–23)
CO2: 25 mEq/L (ref 19–32)
Creatinine, Ser: 1.37 mg/dL — ABNORMAL HIGH (ref 0.50–1.10)
GFR calc Af Amer: 42 mL/min — ABNORMAL LOW (ref >90)
GFR calc non Af Amer: 36 mL/min — ABNORMAL LOW (ref >90)
Potassium: 4.3 mEq/L (ref 3.5–5.1)
Sodium: 137 mEq/L (ref 135–145)

## 2013-09-12 LAB — CBC
HCT: 33.8 % — ABNORMAL LOW (ref 36.0–46.0)
MCHC: 32.8 g/dL (ref 30.0–36.0)
Platelets: 126 10*3/uL — ABNORMAL LOW (ref 150–400)
RDW: 16.1 % — ABNORMAL HIGH (ref 11.5–15.5)

## 2013-09-12 LAB — GLUCOSE, CAPILLARY
Glucose-Capillary: 164 mg/dL — ABNORMAL HIGH (ref 70–99)
Glucose-Capillary: 181 mg/dL — ABNORMAL HIGH (ref 70–99)
Glucose-Capillary: 210 mg/dL — ABNORMAL HIGH (ref 70–99)
Glucose-Capillary: 203 mg/dL — ABNORMAL HIGH (ref 70–99)

## 2013-09-12 MED ORDER — AMIODARONE HCL 100 MG PO TABS
100.0000 mg | ORAL_TABLET | Freq: Every day | ORAL | Status: DC
  Administered 2013-09-12 – 2013-09-17 (×6): 100 mg via ORAL
  Filled 2013-09-12 (×6): qty 1

## 2013-09-12 MED ORDER — INSULIN GLARGINE 100 UNIT/ML ~~LOC~~ SOLN
24.0000 [IU] | Freq: Every day | SUBCUTANEOUS | Status: DC
  Administered 2013-09-12 – 2013-09-16 (×5): 24 [IU] via SUBCUTANEOUS
  Filled 2013-09-12 (×6): qty 0.24

## 2013-09-12 MED ORDER — APIXABAN 2.5 MG PO TABS
2.5000 mg | ORAL_TABLET | Freq: Two times a day (BID) | ORAL | Status: DC
  Administered 2013-09-12 – 2013-09-13 (×3): 2.5 mg via ORAL
  Filled 2013-09-12 (×4): qty 1

## 2013-09-12 MED ORDER — PNEUMOCOCCAL VAC POLYVALENT 25 MCG/0.5ML IJ INJ
0.5000 mL | INJECTION | INTRAMUSCULAR | Status: AC
  Administered 2013-09-14: 13:00:00 0.5 mL via INTRAMUSCULAR
  Filled 2013-09-12 (×3): qty 0.5

## 2013-09-12 NOTE — Progress Notes (Signed)
Subjective:  No complaints. No bleeding. No syncope. No dizziness or SOB.  Heart rate remains upper 30's EP, Dr. Johney Frame, consult 12/16.  Objective:  Vital Signs in the last 24 hours: Temp:  [97.9 F (36.6 C)-98.2 F (36.8 C)] 97.9 F (36.6 C) (12/17 0811) Pulse Rate:  [39-114] 39 (12/17 0811) Resp:  [12-20] 17 (12/17 0811) BP: (111-143)/(39-119) 127/47 mmHg (12/17 0811) SpO2:  [91 %-100 %] 99 % (12/17 0811)  Intake/Output from previous day: 12/16 0701 - 12/17 0700 In: 820 [P.O.:720; I.V.:100] Out: 800 [Urine:800]   Physical Exam: General: Well developed, well nourished, in no acute distress. Head:  Normocephalic and atraumatic. Lungs: Clear to auscultation and percussion. Heart: Huston Foley reg.  No murmur, rubs or gallops.  Abdomen: soft, non-tender, positive bowel sounds. Extremities: No clubbing or cyanosis. No edema. Neurologic: Alert and oriented x 3.    Lab Results:  Recent Labs  09/11/13 2238 09/12/13 0338  WBC 6.2 6.8  HGB 10.8* 11.1*  PLT 129* 126*    Recent Labs  09/11/13 0510 09/12/13 0338  NA 141 137  K 3.1* 4.3  CL 103 102  CO2 27 25  GLUCOSE 81 186*  BUN 41* 34*  CREATININE 1.48* 1.37*    Recent Labs  09/10/13 1325  TROPONINI <0.30   Hepatic Function Panel  Recent Labs  09/10/13 1325  PROT 7.2  ALBUMIN 3.4*  AST 22  ALT 13  ALKPHOS 100  BILITOT 1.0    Imaging: Dg Chest 2 View  09/10/2013   CLINICAL DATA:  Coronary artery disease.  Congestive heart failure.  EXAM: CHEST  2 VIEW  COMPARISON:  07/23/2013.  FINDINGS: Cardiomegaly. Prior CABG. Bilateral airspace opacity likely congestive heart failure although bilateral pneumonia not excluded. Significant worsening aeration from priors. Calcified tortuous aorta.  IMPRESSION: Cardiomegaly with suspected congestive heart failure.   Electronically Signed   By: Davonna Belling M.D.   On: 09/10/2013 14:19   Personally viewed.   Telemetry: Sinus brady 30's, PAC occasional.  Personally  viewed.   Assessment/Plan:  Principal Problem:   GI bleeding Active Problems:   ALZHEIMERS DISEASE   PAROXYSMAL ATRIAL FIBRILLATION   GIB (gastrointestinal bleeding)   Acute blood loss anemia   DM (diabetes mellitus)   Bradycardia  1) Severe bradycardia - sinus. Admitted 09/10/13. Washing out metoprolol 50mg  BID. Was being treated for PAF as well as atach (was persistent 110 previously). I discussed with EP and reviewed consult note from Dr. Johney Frame. A few years ago she saw him in consultation for similar bradycardia. Does not wish to pursue pacemaker. He recommends continuing  amiodarone 100mg  PO QD.  Currently hemodynamically stable. BP stable. In bed.    2) Chronic diastolic HF - holding lasix and aldactone currently. No SOB. No orthopnea. Monitor. Hold diuretic because of low normal BP. Would not be unreasonable to restart her home Aldactone and lasix since BP and Hg stable.   3) GIB - stable. Probable hemorrhoidal bleeding  4) AVR - bioprosthetic  5) CABG - no angina, stable  6) Morbid obesity - weight loss  7) CKD 3 - stable.   8) History of hypokalemia  - (Aldactone is being held) may wish to restart home meds.   9) PAF - was on Xarelto. Now off with GIB. Currently SB. Dr. Johney Frame suggested low dose Eliquis 2.5 BID when comfortable from GI perspective.   Dementia is written as dx but this is mild.   OK with transfer to tele bed.  Heliodoro Domagalski 09/12/2013, 8:30 AM

## 2013-09-12 NOTE — Care Management Note (Addendum)
    Page 1 of 1   09/14/2013     4:08:39 PM   CARE MANAGEMENT NOTE 09/14/2013  Patient:  [name], [name]   Account Number:  [id number]  Date Initiated:  09/11/2013  Documentation initiated by:  Donn Pierini  Subjective/Objective Assessment:   Pt admitted with GIB/ hypotension/ bradycardia     Action/Plan:   PTA pt lived at home with family- NCM to follow for d/c needs   Anticipated DC Date:  09/13/2013   Anticipated DC Plan:  HOME W HOME HEALTH SERVICES      DC Planning Services  CM consult      Choice offered to / List presented to:             Status of service:  In process, will continue to follow Medicare Important Message given?   (If response is "NO", the following Medicare IM given date fields will be blank) Date Medicare IM given:   Date Additional Medicare IM given:    Discharge Disposition:    Per UR Regulation:  Reviewed for med. necessity/level of care/duration of stay  If discussed at Long Length of Stay Meetings, dates discussed:    Comments:  09/14/13 1600 Efrain Clauson, RN, BSN, [place] [birth year]10 Benefits check for Eliquis 2.5mg  = $6.75/ 30 days.  Pt given brochure with free 30 day card and refill assistance cards intact.  09/12/13- 1215- Donn Pierini RN, BSN [phone] Spoke with pt and daughter [name]) at bedside- per conversation pt lives with daughter and has caregivers in the home one aide through social services senior services for 8 hr/week and then family pays out of pocket for another aide to assist some additional hours- but pt does not have someone with her 24/7. PT is recommending ST-SNF vs 24/7 care at home- daughter feels like ST-SNF would be safest option- pt is somewhat hesitant but agrees to look into somewhere close and mentions that [facility] or [facility] might be ok. CSW contacted for placement needs. Pt and daughter both state that pt as had HH in the past with [facility]- NCM to continue to follow for d/c needs/planning

## 2013-09-12 NOTE — Progress Notes (Signed)
Physical Therapy Treatment Patient Details Name: [name] MRN: [id number] DOB: Jan 25, 1933 Today's Date: 09/12/2013 Time: 4098-1191 PT Time Calculation (min): 30 min  PT Assessment / Plan / Recommendation  History of Present Illness Pt admitted with rectal bleeding and low HR   PT Comments   Progressing slowly with increased pain noted today.  Admitted to confusion overnight with struggling to get up and now feels sore.  Still recommend SNF level rehab at d/c.  Follow Up Recommendations  SNF;Supervision/Assistance - 24 hour     Does the patient have the potential to tolerate intense rehabilitation   N/A  Barriers to Discharge  None      Equipment Recommendations  None recommended by PT    Recommendations for Other Services  None  Frequency Min 3X/week   Progress towards PT Goals Progress towards PT goals: Progressing toward goals  Plan Current plan remains appropriate    Precautions / Restrictions Precautions Precautions: Fall   Pertinent Vitals/Pain Mod c/o pain right knee with ambulation and bilateral sides "sore" from struggling overnight    Mobility  Bed Mobility Bed Mobility: Supine to Sit Supine to Sit: 4: Min assist;With rails;HOB elevated Details for Bed Mobility Assistance: assist for trunk elevation Transfers Sit to Stand: 4: Min assist;From bed;With upper extremity assist Stand to Sit: 4: Min assist;To chair/3-in-1 Ambulation/Gait Ambulation/Gait Assistance: 4: Min assist;3: Mod assist Ambulation Distance (Feet): 20 Feet Assistive device: Rolling walker Ambulation/Gait Assistance Details: increased antalgia on right today and though initially c/o dizziness had no complaints with amb with HR to 68 Gait Pattern: Step-to pattern;Antalgic;Trunk flexed;Shuffle    Exercises General Exercises - Lower Extremity Ankle Circles/Pumps: AROM;Both;10 reps;Supine Heel Slides: AAROM;Both;10 reps;5 reps;Supine     PT Goals (current goals can now be found in the  care plan section)    Visit Information  Last PT Received On: 09/12/13 Assistance Needed: +1 History of Present Illness: Pt admitted with rectal bleeding and low HR    Subjective Data   My daughter is going to be upset   Cognition  Cognition Arousal/Alertness: Awake/alert Behavior During Therapy: WFL for tasks assessed/performed Overall Cognitive Status: Impaired/Different from baseline Area of Impairment: Orientation General Comments: currently oriented, but admits to disoriented in early AM and trying to get up unaided and sore from working a long time to get up     Pension scheme manager Standing - Balance Support: Bilateral upper extremity supported Static Standing - Level of Assistance: 5: Stand by assistance  End of Session PT - End of Session Equipment Utilized During Treatment: Gait belt;Oxygen Activity Tolerance: Patient limited by pain Patient left: in chair;with call bell/phone within reach Nurse Communication: Mobility status   GP     [facility] 09/12/2013, 11:33 AM Sheran Lawless, PT [phone] 09/12/2013

## 2013-09-12 NOTE — Progress Notes (Signed)
[facility] Progress Note [name] TEAM 1 - Stepdown/ICU TEAM   Tareka Jhaveri [facility] VWU:[id number] DOB: 1932-12-10 DOA: 09/10/2013 PCP: [name], MD  Brief narrative: 79 y/o female who lives with her daughter and has a h/o IDDM, HTN, CAD, PAF on Xarelto who presented with BRBPR with dizziness, dyspnea and diaphoresis.  She was noted to be bradycardic in there ER and her Aricept, B Blocker and Amiodarone were held.   HPI: Pt is somewhat lethargic.  She c/o severe dizziness and DOE when attempting to walk over to the sink this morning.  She denies cp, n/v, or abdom pain.  Assessment/Plan:  GI bleeding with hemorrhoids - No further procedures are planned at this time per GI - pt also not interested in colonoscopy - It is supected that bleeding may have been hemorrhoidal in setting of Xarelto - cont to follow Hgb  - start Anusol - watch for re-bleeding w/ resumption of anticoag  Acute blood loss anemia - Hgb dropped from 13 to 10-11 range - appears stable at this time - follow trend   Bradycardia - Paroxysmal atrial fibrillation  - hold Xarelto due to bleed - pt will be started on Eliquis today  - holding Metoprolol and have resumed Amio at suggestion of EP - cont PT and follow for symptoms (suspicious that pt may prove to be symptomatic when attempting to exert herself)  ALZHEIMERS DISEASE - aricept on hold due to bradycardia  DM  - CBG climbing - intake good - adjust tx and follow  Thrombocytopenia - mild and chronic  Acute on chronic renal failure baseline GFR appears to be ~45 - GFR now approaching baseline - acute insuff likely due to poor CO related to bradycardia + blood loss   Coronary artery disease s/p CABG in 2009   AoVR 2009  Chronic LBBB  Sleep apnea   Morbid obesity - Body mass index is 41.99 kg/(m^2).  Code Status: FULL Family Communication: spoke w/ pt and daughter at bedside  Disposition Plan:  Ultimate plan is for d/c to SNF for rehab stay  - need to monitor HR for additional 24-48hrs as brady is currently persitent and quite symptomatic - watch for recurrent bleeding with addition of Eliquis  Consultants: Cardiology EP GI  Procedures: none  Antibiotics: none  DVT prophylaxis: SCDs >> Eliquis  Objective: Blood pressure 101/67, pulse 48, temperature 97.6 F (36.4 C), temperature source Oral, resp. rate 19, height 5\' 3"  (1.6 m), weight 107.5 kg (236 lb 15.9 oz), SpO2 100.00%.  Intake/Output Summary (Last 24 hours) at 09/12/13 1322 Last data filed at 09/12/13 0300  Gross per 24 hour  Intake    360 ml  Output    600 ml  Net   -240 ml   Exam: General: No acute respiratory distress at rest - somewhat lethargic  Lungs: Clear to auscultation bilaterally without wheezes or crackles Cardiovascular: Bradycardic - no gallup or rub   Abdomen: Obese, nontender, nondistended, soft, bowel sounds positive, no rebound, no ascites, no appreciable mass Extremities: No significant cyanosis, clubbing, or edema bilateral lower extremities  Data Reviewed: Basic Metabolic Panel:  Recent Labs Lab 09/10/13 1325 09/11/13 0510 09/12/13 0338  NA 136 141 137  K 4.5 3.1* 4.3  CL 98 103 102  CO2 27 27 25   GLUCOSE 283* 81 186*  BUN 50* 41* 34*  CREATININE 1.64* 1.48* 1.37*  CALCIUM 9.0 8.8 8.7   Liver Function Tests:  Recent Labs Lab 09/10/13 1325  AST 22  ALT 13  ALKPHOS 100  BILITOT 1.0  PROT 7.2  ALBUMIN 3.4*   CBC:  Recent Labs Lab 09/10/13 1325  09/10/13 2240 09/11/13 0510 09/11/13 1055 09/11/13 2238 09/12/13 0338  WBC 6.4  --   --  8.0  --  6.2 6.8  NEUTROABS 4.4  --   --   --   --   --   --   HGB 11.8*  < > 10.2* 10.9* 10.3* 10.8* 11.1*  HCT 34.9*  < > 31.4* 33.1* 30.8* 33.5* 33.8*  MCV 94.3  --   --  96.8  --  97.7 96.3  PLT 137*  --   --  130*  --  129* 126*  < > = values in this interval not displayed. Cardiac Enzymes:  Recent Labs Lab 09/10/13 1325  TROPONINI <0.30   BNP (last 3  results)  Recent Labs  02/04/13 1050 03/13/13 1345 07/23/13 1905  PROBNP 23347.0* 6455.0* 6746.0*   CBG:  Recent Labs Lab 09/11/13 1144 09/11/13 1517 09/11/13 2138 09/12/13 0812 09/12/13 1214  GLUCAP 119* 156* 196* 164* 181*    Recent Results (from the past 240 hour(s))  MRSA PCR SCREENING     Status: None   Collection Time    09/10/13  6:20 PM      Result Value Range Status   MRSA by PCR NEGATIVE  NEGATIVE Final   Comment:            The GeneXpert MRSA Assay (FDA     approved for NASAL specimens     only), is one component of a     comprehensive MRSA colonization     surveillance program. It is not     intended to diagnose MRSA     infection nor to guide or     monitor treatment for     MRSA infections.     Studies:  Recent x-ray studies have been reviewed in detail by the Attending Physician  Scheduled Meds:  Scheduled Meds: . amiodarone  100 mg Oral Daily  . gabapentin  300 mg Oral Daily  . gabapentin  600 mg Oral QHS  . hydrocortisone  25 mg Rectal BID  . insulin aspart  0-9 Units Subcutaneous TID WC  . nystatin   Topical BID  . PARoxetine  40 mg Oral Daily  . pneumococcal 23 valent vaccine  0.5 mL Intramuscular Tomorrow-1000  . potassium chloride  40 mEq Oral BID  . sodium chloride  3 mL Intravenous Q12H    Time spent on care of this patient: 35 min   Emaya Preston T, MD  [facility] Office  [phone] Pager - Text Page per Loretha Stapler as per below:  On-Call/Text Page:      [email or web address]      password [email or web address]  If 7PM-7AM, please contact night-coverage [email or web address] Password [email or web address] 09/12/2013, 1:22 PM   LOS: 2 days

## 2013-09-12 NOTE — Progress Notes (Signed)
Pt to TX to 4E-10, called report.

## 2013-09-12 NOTE — Progress Notes (Signed)
Pt HR sustaining between 35-45 on monitor. Sinus brady. Per MD note, Cardiologist aware of HR in upper 30s. Pt not symptomatic and in no apparent distress at this time. Will continue to monitor. Baron Hamper, RN

## 2013-09-12 NOTE — Clinical Social Work Psychosocial (Signed)
Clinical Social Work Department BRIEF PSYCHOSOCIAL ASSESSMENT 09/12/2013  Patient:  VERSIA, MIGNOGNA     Account Number:  [id number]     Admit date:  09/10/2013  Clinical Social Worker:  Lavell Luster  Date/Time:  09/12/2013 03:44 PM  Referred by:  Physician  Date Referred:  09/12/2013 Referred for  SNF Placement   Other Referral:   Interview type:  Other - See comment Other interview type:   Daughter interviewed as patient was not alert and oriented at time of assessment.    PSYCHOSOCIAL DATA Living Status:  FAMILY Admitted from facility:   Level of care:   Primary support name:  Blaine Hamper [phone] Primary support relationship to patient:  CHILD, ADULT Degree of support available:   Support is adequate.    CURRENT CONCERNS Current Concerns  Post-Acute Placement   Other Concerns:    SOCIAL WORK ASSESSMENT / PLAN CSW met with daughter at bedside to discuss recommendation for SNF rehab. Daughter is agreeable to SNF search and placement in [facility]. Daughter does not report any previous SNF experience for patient, but does report that they would prefer [facility] or [facility]. Patient is admitted from home with daughter and son in-law. Plan is for patient to return home after rehab.   Assessment/plan status:  Psychosocial Support/Ongoing Assessment of Needs Other assessment/ plan:   Complete FL2, Fax, PASRR, Blue Medicare Auth   Information/referral to community resources:   CSW contact information and SNF list given to daughter.    PATIENT'S/FAMILY'S RESPONSE TO PLAN OF CARE: Daughter agreeable to SNF search and placement for patient. Daughter was pleasant, appropriate, and appreciative of CSW contact. Daughter awaits bed offers. CSW will continue to follow.       Roddie Mc, [place], [place], [phone]

## 2013-09-13 ENCOUNTER — Telehealth: Payer: Self-pay | Admitting: Internal Medicine

## 2013-09-13 ENCOUNTER — Telehealth: Payer: Self-pay | Admitting: Cardiology

## 2013-09-13 DIAGNOSIS — I495 Sick sinus syndrome: Secondary | ICD-10-CM

## 2013-09-13 DIAGNOSIS — D696 Thrombocytopenia, unspecified: Secondary | ICD-10-CM | POA: Diagnosis present

## 2013-09-13 DIAGNOSIS — I5042 Chronic combined systolic (congestive) and diastolic (congestive) heart failure: Secondary | ICD-10-CM | POA: Diagnosis present

## 2013-09-13 DIAGNOSIS — J449 Chronic obstructive pulmonary disease, unspecified: Secondary | ICD-10-CM

## 2013-09-13 DIAGNOSIS — M6281 Muscle weakness (generalized): Secondary | ICD-10-CM

## 2013-09-13 DIAGNOSIS — E875 Hyperkalemia: Secondary | ICD-10-CM | POA: Diagnosis not present

## 2013-09-13 LAB — GLUCOSE, CAPILLARY
Glucose-Capillary: 162 mg/dL — ABNORMAL HIGH (ref 70–99)
Glucose-Capillary: 150 mg/dL — ABNORMAL HIGH (ref 70–99)
Glucose-Capillary: 181 mg/dL — ABNORMAL HIGH (ref 70–99)

## 2013-09-13 LAB — BASIC METABOLIC PANEL
BUN: 35 mg/dL — ABNORMAL HIGH (ref 6–23)
Chloride: 105 mEq/L (ref 96–112)
Creatinine, Ser: 1.34 mg/dL — ABNORMAL HIGH (ref 0.50–1.10)
GFR calc non Af Amer: 37 mL/min — ABNORMAL LOW (ref >90)
Glucose, Bld: 200 mg/dL — ABNORMAL HIGH (ref 70–99)
Potassium: 5.2 mEq/L — ABNORMAL HIGH (ref 3.5–5.1)

## 2013-09-13 LAB — CBC
HCT: 33.6 % — ABNORMAL LOW (ref 36.0–46.0)
Hemoglobin: 11 g/dL — ABNORMAL LOW (ref 12.0–15.0)
MCH: 32.2 pg (ref 26.0–34.0)
MCHC: 32.7 g/dL (ref 30.0–36.0)
MCV: 98.2 fL (ref 78.0–100.0)
RBC: 3.42 MIL/uL — ABNORMAL LOW (ref 3.87–5.11)
RDW: 16.5 % — ABNORMAL HIGH (ref 11.5–15.5)

## 2013-09-13 MED ORDER — APIXABAN 2.5 MG PO TABS
2.5000 mg | ORAL_TABLET | Freq: Two times a day (BID) | ORAL | Status: DC
  Administered 2013-09-14 – 2013-09-17 (×6): 2.5 mg via ORAL
  Filled 2013-09-13 (×7): qty 1

## 2013-09-13 MED ORDER — INSULIN ASPART 100 UNIT/ML ~~LOC~~ SOLN
3.0000 [IU] | Freq: Three times a day (TID) | SUBCUTANEOUS | Status: DC
  Administered 2013-09-13 (×2): 3 [IU] via SUBCUTANEOUS
  Administered 2013-09-15: 14:00:00 via SUBCUTANEOUS
  Administered 2013-09-15 – 2013-09-17 (×6): 3 [IU] via SUBCUTANEOUS

## 2013-09-13 MED ORDER — CHLORHEXIDINE GLUCONATE 4 % EX LIQD
60.0000 mL | Freq: Once | CUTANEOUS | Status: AC
  Administered 2013-09-13: 4 via TOPICAL
  Filled 2013-09-13: qty 60

## 2013-09-13 MED ORDER — INSULIN ASPART 100 UNIT/ML ~~LOC~~ SOLN: 0.0000 [IU] | Freq: Every day | SUBCUTANEOUS | Status: DC

## 2013-09-13 MED ORDER — SPIRONOLACTONE 12.5 MG HALF TABLET
12.5000 mg | ORAL_TABLET | Freq: Two times a day (BID) | ORAL | Status: DC
  Administered 2013-09-13 – 2013-09-17 (×9): 12.5 mg via ORAL
  Filled 2013-09-13 (×11): qty 1

## 2013-09-13 MED ORDER — FUROSEMIDE 40 MG PO TABS
40.0000 mg | ORAL_TABLET | Freq: Every day | ORAL | Status: DC
  Administered 2013-09-13 – 2013-09-17 (×5): 40 mg via ORAL
  Filled 2013-09-13 (×5): qty 1

## 2013-09-13 MED ORDER — FUROSEMIDE 40 MG PO TABS
40.0000 mg | ORAL_TABLET | Freq: Every day | ORAL | Status: DC
  Filled 2013-09-13: qty 1

## 2013-09-13 MED ORDER — FUROSEMIDE 80 MG PO TABS
80.0000 mg | ORAL_TABLET | Freq: Every day | ORAL | Status: DC
  Administered 2013-09-14 – 2013-09-17 (×4): 80 mg via ORAL
  Filled 2013-09-13 (×5): qty 1

## 2013-09-13 MED ORDER — INSULIN ASPART 100 UNIT/ML ~~LOC~~ SOLN
0.0000 [IU] | Freq: Three times a day (TID) | SUBCUTANEOUS | Status: DC
  Administered 2013-09-13 – 2013-09-15 (×3): 1 [IU] via SUBCUTANEOUS
  Administered 2013-09-16: 2 [IU] via SUBCUTANEOUS
  Administered 2013-09-17 (×2): 1 [IU] via SUBCUTANEOUS

## 2013-09-13 MED ORDER — CHLORHEXIDINE GLUCONATE 4 % EX LIQD
60.0000 mL | Freq: Once | CUTANEOUS | Status: AC
  Administered 2013-09-14: 4 via TOPICAL
  Filled 2013-09-13 (×2): qty 60

## 2013-09-13 NOTE — Telephone Encounter (Signed)
New Message  Pt daughter called// requests a call pack to discuss pacemaker.. Daughter believes that there has been a mix up// She states the pt has dementia and she is telling people that she doesn't want a pacemaker when it is needed. Please call back to discuss.

## 2013-09-13 NOTE — Progress Notes (Signed)
Subjective:  No complaints in bed. Yesterday got up and walked. Felt some dizziness at first, weak. This is not unusual for her (even with heart rate of 110). Heart rate remains upper 30's, 40's.  EP, Dr. Johney Frame, consult 12/16.  Objective:  Vital Signs in the last 24 hours: Temp:  [97.4 F (36.3 C)-97.6 F (36.4 C)] 97.6 F (36.4 C) (12/18 0635) Pulse Rate:  [40-68] 43 (12/18 0635) Resp:  [18-20] 20 (12/18 0635) BP: (101-157)/(37-77) 121/46 mmHg (12/18 0635) SpO2:  [96 %-100 %] 97 % (12/18 0635) Weight:  [239 lb 9.6 oz (108.682 kg)-241 lb 2.9 oz (109.4 kg)] 241 lb 2.9 oz (109.4 kg) (12/18 1096)  Intake/Output from previous day: 12/17 0701 - 12/18 0700 In: 480 [P.O.:480] Out: 250 [Urine:250]   Physical Exam: General: Well developed, well nourished, in no acute distress. Head:  Normocephalic and atraumatic. Lungs: Clear to auscultation and percussion. Heart: Huston Foley reg.  No murmur, rubs or gallops.  Abdomen: soft, non-tender, positive bowel sounds. Extremities: No clubbing or cyanosis. No edema. Neurologic: Alert and oriented x 3.    Lab Results:  Recent Labs  09/12/13 0338 09/13/13 0348  WBC 6.8 6.8  HGB 11.1* 11.0*  PLT 126* 122*    Recent Labs  09/12/13 0338 09/13/13 0348  NA 137 138  K 4.3 5.2*  CL 102 105  CO2 25 26  GLUCOSE 186* 200*  BUN 34* 35*  CREATININE 1.37* 1.34*    Recent Labs  09/10/13 1325  TROPONINI <0.30   Hepatic Function Panel  Recent Labs  09/10/13 1325  PROT 7.2  ALBUMIN 3.4*  AST 22  ALT 13  ALKPHOS 100  BILITOT 1.0   .   Telemetry: Sinus brady 30's-40, PAC occasional. Mild increase with movement.  Personally viewed.   Assessment/Plan:  Principal Problem:   GI bleeding Active Problems:   OBESITY, MORBID   ALZHEIMERS DISEASE   PAROXYSMAL ATRIAL FIBRILLATION   Internal hemorrhoids with prolapse & bleeding   GIB (gastrointestinal bleeding)   Acute blood loss anemia   DM (diabetes mellitus)   Bradycardia   Systolic and diastolic CHF, chronic   Hypokalemia   Thrombocytopenia, unspecified  1) Severe bradycardia - sinus. Admitted 09/10/13. Washed out metoprolol 50mg  BID. Was being treated for PAF as well as atach (was persistent 110 previously). I discussed with EP and reviewed consult note from Dr. Johney Frame. A few years ago she saw him in consultation for similar bradycardia. Does not wish to pursue pacemaker. He recommends continuing  amiodarone 100mg  PO QD. Currently hemodynamically stable. BP stable. In bed.  I am not convinced that her symptoms are one to one correlation with bradycardia (long standing deconditioning-for instance always in wheelchair in clinic) however I would like Dr. Jenel Lucks team to revisit to weigh in again.   2) Chronic diastolic HF - holding lasix and aldactone currently. No SOB. No orthopnea. Monitor. Hold diuretic because of low normal BP. Would not be unreasonable to restart her home Aldactone and lasix since BP and Hg stable. Will go ahead and restart.   3) GIB - stable. Probable hemorrhoidal bleeding. Started Eliquis 2.5mg  BID.   4) AVR - bioprosthetic  5) CABG - no angina, stable  6) Morbid obesity - weight loss  7) CKD 3 - stable.   8) History of hypokalemia  - (Aldactone is being held) may wish to restart home meds.   9) PAF - was on Xarelto. Now off with GIB. Currently SB. Eliquis 2.5 BID.  Dementia is written as dx but this is mild.       Mirtha Jain 09/13/2013, 8:33 AM

## 2013-09-13 NOTE — Progress Notes (Signed)
SUBJECTIVE: Patient with persistent bradycardia, dizziness, and weakness.  Heart rates are persisting in the 30's and not increasing significantly with ambulation.  Beta blockers have been on hold since admission.   CURRENT MEDICATIONS: . amiodarone  100 mg Oral Daily  . apixaban  2.5 mg Oral BID  . furosemide  40 mg Oral q1800  . [START ON 09/14/2013] furosemide  80 mg Oral Q breakfast  . gabapentin  300 mg Oral Daily  . gabapentin  600 mg Oral QHS  . hydrocortisone  25 mg Rectal BID  . insulin aspart  0-5 Units Subcutaneous QHS  . insulin aspart  0-9 Units Subcutaneous TID WC  . insulin aspart  3 Units Subcutaneous TID WC  . insulin glargine  24 Units Subcutaneous QHS  . nystatin   Topical BID  . PARoxetine  40 mg Oral Daily  . pneumococcal 23 valent vaccine  0.5 mL Intramuscular Tomorrow-1000  . sodium chloride  3 mL Intravenous Q12H  . spironolactone  12.5 mg Oral BID      OBJECTIVE: Physical Exam: Filed Vitals:   09/12/13 1545 09/12/13 1900 09/12/13 2112 09/13/13 0635  BP: 135/77 157/37 149/39 121/46  Pulse:  44 40 43  Temp: 97.6 F (36.4 C) 97.6 F (36.4 C) 97.4 F (36.3 C) 97.6 F (36.4 C)  TempSrc: Oral Oral Oral Oral  Resp: 18 18 20 20   Height:  5\' 3"  (1.6 m)    Weight:  239 lb 9.6 oz (108.682 kg)  241 lb 2.9 oz (109.4 kg)  SpO2: 100% 100% 96% 97%    Intake/Output Summary (Last 24 hours) at 09/13/13 1511 Last data filed at 09/13/13 1100  Gross per 24 hour  Intake    480 ml  Output    601 ml  Net   -121 ml    Telemetry reveals sinus bradycardia with rates in the 30's  GEN- The patient is morbidly obese and ill appearing, alert and oriented x 3 today.   Head- normocephalic, atraumatic Eyes-  Sclera clear, conjunctiva pink Ears- hearing intact Oropharynx- clear Neck- supple  Lungs- Clear to ausculation bilaterally, normal work of breathing Heart- bradycardic regular rhythm GI- soft, NT, ND, + BS Extremities- no clubbing, cyanosis, or  edema  LABS: Basic Metabolic Panel:  Recent Labs  86/57/84 0338 09/13/13 0348  NA 137 138  K 4.3 5.2*  CL 102 105  CO2 25 26  GLUCOSE 186* 200*  BUN 34* 35*  CREATININE 1.37* 1.34*  CALCIUM 8.7 8.6   CBC:  Recent Labs  09/12/13 0338 09/13/13 0348  WBC 6.8 6.8  HGB 11.1* 11.0*  HCT 33.8* 33.6*  MCV 96.3 98.2  PLT 126* 122*    RADIOLOGY: Dg Chest 2 View 09/10/2013   CLINICAL DATA:  Coronary artery disease.  Congestive heart failure.  EXAM: CHEST  2 VIEW  COMPARISON:  07/23/2013.  FINDINGS: Cardiomegaly. Prior CABG. Bilateral airspace opacity likely congestive heart failure although bilateral pneumonia not excluded. Significant worsening aeration from priors. Calcified tortuous aorta.  IMPRESSION: Cardiomegaly with suspected congestive heart failure.   Electronically Signed   By: Davonna Belling M.D.   On: 09/10/2013 14:19    ASSESSMENT AND PLAN:  Principal Problem:   GI bleeding Active Problems:   OBESITY, MORBID   ALZHEIMERS DISEASE   PAROXYSMAL ATRIAL FIBRILLATION   Internal hemorrhoids with prolapse & bleeding   GIB (gastrointestinal bleeding)   Acute blood loss anemia   DM (diabetes mellitus)   Bradycardia   Systolic and diastolic  CHF, chronic   Hypokalemia   Thrombocytopenia, unspecified  1. Sick sinus syndrome She continues to be quite symptomatic despite stopping her beta blocker.  She requires amiodarone for her afib and is on the lowest effective dose.  She feels very fatigued and is also SOB.  I suspect that this is at least partially related to her sinus bradycardia. The patient has symptomatic sinus bradycardia.  I would therefore recommend pacemaker implantation at this time.  Risks, benefits, alternatives to pacemaker implantation were discussed in detail with the patient today. The patient understands that the risks include but are not limited to bleeding, infection, pneumothorax, perforation, tamponade, vascular damage, renal failure, MI, stroke, death,   and lead dislodgement and wishes to proceed. We will therefore schedule the procedure at the next available time.  2. afib Stable Hold eliquis in anticipation of PPM implant tomorrow

## 2013-09-13 NOTE — Progress Notes (Signed)
OT Cancellation Note and Discharge  Patient Details Name: [name] MRN: [id number] DOB: 10/25/1932   Cancelled Treatment:    Reason Eval/Treat Not Completed: Other (comment). Plan is for pt to go to SNF, will defer OT eval to that facility.  Evette Georges [phone] 09/13/2013, 8:12 AM

## 2013-09-13 NOTE — Telephone Encounter (Signed)
Spoke with RN on floor [place] not avaliable and let her know she would need to page Dr Johney Frame or [facility] and let them know

## 2013-09-13 NOTE — Telephone Encounter (Signed)
Called patient's daughter to inform her that Dr. Johney Frame will be coming by to see patient today (in-patient). Daughter states she will be in attendance and will look forward to speaking with him.

## 2013-09-13 NOTE — Telephone Encounter (Signed)
Patient's daughter, [name], would like to know what the next steps are regarding her mother's treatment plan related to getting a pacemaker. She states that the patient, [name], is in agreement with getting a pacemaker as soon as possible to maximize her function. Daughter states she would like to discuss as soon as possible with either or both, Dr. Anne Fu and Dr. Johney Frame.  The patient's daughter, [name] - mobile number is: [phone]. Routed to Dr. Anne Fu and Dr. Johney Frame.

## 2013-09-13 NOTE — Telephone Encounter (Signed)
Dr. Johney Frame to see her today again in the hospital. Stay tuned.

## 2013-09-13 NOTE — Telephone Encounter (Signed)
New Prob   Pt has decided she would like a pace maker placed. New decision nurse wanted to make Dr. Johney Frame aware of.

## 2013-09-13 NOTE — Progress Notes (Signed)
[facility] PROGRESS NOTE   [name] [facility] WUJ:[id number] DOB: [birth year] DOA: 09/10/2013 PCP: [name], MD  Brief narrative: [name] is an 79 y.o. female with PMH of diabetes, hypertension, CAD, PAF managed with several oh, status post aVR who was admitted on 09/10/2013 with GI bleeding.  Assessment/Plan: Principal Problem:   GI bleeding / acute blood loss anemia / internal hemorrhoids with prolapse and bleeding The patient was admitted to the SDU and monitored closely with serial hemoglobin checks. She was kept n.p.o. and placed on IV PPI therapy and IV fluids. Xarelto and aspirin were held. Seen by Dr. Loreta Ave of gastroenterology on 09/10/2013. GI bleeding thought to be from hemorrhoids, so Anusol was started. Patient declined further assessment with colonoscopy. Active Problems:   Thrombocytopenia  Mild and chronic.   Hypokalemia Potassium replaced. Now on the high side.   Morbid obesity Weight loss encouraged. BMI 41.99 kg/(m^2).   Coronary artery disease History of CABG. Stable with no anginal symptoms.   Systolic and diastolic CHF, chronic 2-D echo done 02/07/2013, EF 45-50%. Diuretics held on admission secondary to hypotension. We'll resume Lasix, but hold spironolactone secondary to hyperkalemia. Monitor renal function closely with resumption of Lasix.   ALZHEIMERS DISEASE Aricept held on admission secondary to bradycardia.   PAROXYSMAL ATRIAL FIBRILLATION / bradycardia Beta blocker held on admission secondary to bradycardia. She was given IV glucagon. Evaluated by cardiologist on 09/10/2013 and by electrophysiologist on 09/11/2013. The patient has declined consideration for a pacemaker initially, but now is reconsidering. Continue low-dose amiodarone.  Eliquis (2.5 mg BID) recommended by cardiology given her CHADS2VASC score of at least 6, which was started on 09/12/2013. Despite heart rates as low as the upper 30s, the patient has been asymptomatic.   DM  (diabetes mellitus) with neurological complications Last hemoglobin A1c 6.8% (06/2013).  Lantus held on admission secondary to n.p.o. status. Diet and Lantus resumed along with SSI. Currently on Lantus 24 units daily and insulin sensitive SSI 3 times a day. CBGs 162-210. Add meal coverage.   Acute renal failure in the setting of stage III chronic kidney disease Baseline creatinine around 1.2. Creatinine 1.64 on admission, slowly trending down towards baseline.  Code Status: Full. Family Communication: No family currently at the bedside. Disposition Plan: SNF vs 24/7 care at home.   IV access:  Peripheral IV  Medical Consultants:  Cardiology  Gastroenterology  Other Consultants:  Physical therapy: 24-hour supervision versus SNF recommended.  Anti-infectives:  None.  HPI/Subjective: [name] tells me "I don't feel so good ". She denies shortness of breath, pain, nausea, vomiting, decreased appetite. She is having some issues with constipation and activity tolerance. She feels like she has had problems with her eyesight.  Objective: Filed Vitals:   09/12/13 1545 09/12/13 1900 09/12/13 2112 09/13/13 0635  BP: 135/77 157/37 149/39 121/46  Pulse:  44 40 43  Temp: 97.6 F (36.4 C) 97.6 F (36.4 C) 97.4 F (36.3 C) 97.6 F (36.4 C)  TempSrc: Oral Oral Oral Oral  Resp: 18 18 20 20   Height:  5\' 3"  (1.6 m)    Weight:  108.682 kg (239 lb 9.6 oz)  109.4 kg (241 lb 2.9 oz)  SpO2: 100% 100% 96% 97%    Intake/Output Summary (Last 24 hours) at 09/13/13 0723 Last data filed at 09/13/13 0442  Gross per 24 hour  Intake    480 ml  Output    250 ml  Net    230 ml    Exam:  Gen:  NAD Cardiovascular:  Bradycardic, No M/R/G Respiratory:  Lungs CTAB Gastrointestinal:  Abdomen soft, NT/ND, + BS Extremities:  No C/E/C  Data Reviewed: Basic Metabolic Panel:  Recent Labs Lab 09/10/13 1325 09/11/13 0510 09/12/13 0338 09/13/13 0348  NA 136 141 137 138  K 4.5 3.1* 4.3  5.2*  CL 98 103 102 105  CO2 27 27 25 26   GLUCOSE 283* 81 186* 200*  BUN 50* 41* 34* 35*  CREATININE 1.64* 1.48* 1.37* 1.34*  CALCIUM 9.0 8.8 8.7 8.6   GFR Estimated Creatinine Clearance: 41.1 ml/min (by C-G formula based on Cr of 1.34). Liver Function Tests:  Recent Labs Lab 09/10/13 1325  AST 22  ALT 13  ALKPHOS 100  BILITOT 1.0  PROT 7.2  ALBUMIN 3.4*   Coagulation profile  Recent Labs Lab 09/10/13 1325  INR 1.70*    CBC:  Recent Labs Lab 09/10/13 1325  09/11/13 0510 09/11/13 1055 09/11/13 2238 09/12/13 0338 09/13/13 0348  WBC 6.4  --  8.0  --  6.2 6.8 6.8  NEUTROABS 4.4  --   --   --   --   --   --   HGB 11.8*  < > 10.9* 10.3* 10.8* 11.1* 11.0*  HCT 34.9*  < > 33.1* 30.8* 33.5* 33.8* 33.6*  MCV 94.3  --  96.8  --  97.7 96.3 98.2  PLT 137*  --  130*  --  129* 126* 122*  < > = values in this interval not displayed. Cardiac Enzymes:  Recent Labs Lab 09/10/13 1325  TROPONINI <0.30   BNP (last 3 results)  Recent Labs  02/04/13 1050 03/13/13 1345 07/23/13 1905  PROBNP 23347.0* 6455.0* 6746.0*   CBG:  Recent Labs Lab 09/12/13 0812 09/12/13 1214 09/12/13 1549 09/12/13 2149 09/13/13 0619  GLUCAP 164* 181* 210* 203* 162*   Microbiology Recent Results (from the past 240 hour(s))  MRSA PCR SCREENING     Status: None   Collection Time    09/10/13  6:20 PM      Result Value Range Status   MRSA by PCR NEGATIVE  NEGATIVE Final   Comment:            The GeneXpert MRSA Assay (FDA     approved for NASAL specimens     only), is one component of a     comprehensive MRSA colonization     surveillance program. It is not     intended to diagnose MRSA     infection nor to guide or     monitor treatment for     MRSA infections.     Procedures and Diagnostic Studies: Dg Chest 2 View  09/10/2013   CLINICAL DATA:  Coronary artery disease.  Congestive heart failure.  EXAM: CHEST  2 VIEW  COMPARISON:  07/23/2013.  FINDINGS: Cardiomegaly. Prior  CABG. Bilateral airspace opacity likely congestive heart failure although bilateral pneumonia not excluded. Significant worsening aeration from priors. Calcified tortuous aorta.  IMPRESSION: Cardiomegaly with suspected congestive heart failure.   Electronically Signed   By: Davonna Belling M.D.   On: 09/10/2013 14:19    Scheduled Meds: . amiodarone  100 mg Oral Daily  . apixaban  2.5 mg Oral BID  . gabapentin  300 mg Oral Daily  . gabapentin  600 mg Oral QHS  . hydrocortisone  25 mg Rectal BID  . insulin aspart  0-9 Units Subcutaneous TID WC  . insulin glargine  24 Units Subcutaneous QHS  . nystatin  Topical BID  . PARoxetine  40 mg Oral Daily  . pneumococcal 23 valent vaccine  0.5 mL Intramuscular Tomorrow-1000  . sodium chloride  3 mL Intravenous Q12H   Continuous Infusions:   Time spent: 35 minutes with > 50% of time discussing current diagnostic test results, clinical impression and plan of care.    LOS: 3 days   Murel Shenberger  [facility] Pager [phone].   *Please note that the hospitalists switch teams on Wednesdays. Please call the flow manager at [phone] if you are having difficulty reaching the hospitalist taking care of this patient as she can update you and provide the most up-to-date pager number of provider caring for the patient. If 8PM-8AM, please contact night-coverage at [email or web address], password [facility]  09/13/2013, 7:23 AM

## 2013-09-14 ENCOUNTER — Encounter (HOSPITAL_COMMUNITY)
Admission: EM | Disposition: A | Discharge status: Skilled Nursing Facility | Payer: Self-pay | Source: Home / Self Care | Attending: Internal Medicine

## 2013-09-14 DIAGNOSIS — I495 Sick sinus syndrome: Secondary | ICD-10-CM

## 2013-09-14 HISTORY — PX: PERMANENT PACEMAKER INSERTION: SHX5480

## 2013-09-14 HISTORY — PX: PACEMAKER INSERTION: SHX728

## 2013-09-14 LAB — GLUCOSE, CAPILLARY
Glucose-Capillary: 96 mg/dL (ref 70–99)
Glucose-Capillary: 96 mg/dL (ref 70–99)

## 2013-09-14 LAB — BASIC METABOLIC PANEL
Chloride: 107 mEq/L (ref 96–112)
GFR calc Af Amer: 53 mL/min — ABNORMAL LOW (ref >90)
GFR calc non Af Amer: 45 mL/min — ABNORMAL LOW (ref >90)
Potassium: 4.3 mEq/L (ref 3.5–5.1)
Sodium: 141 mEq/L (ref 135–145)

## 2013-09-14 SURGERY — PERMANENT PACEMAKER INSERTION
Anesthesia: LOCAL

## 2013-09-14 MED ORDER — SODIUM CHLORIDE 0.9 % IV SOLN: INTRAVENOUS | Status: DC

## 2013-09-14 MED ORDER — CEFAZOLIN SODIUM 1-5 GM-% IV SOLN
1.0000 g | Freq: Four times a day (QID) | INTRAVENOUS | Status: AC
  Administered 2013-09-14 – 2013-09-15 (×3): 1 g via INTRAVENOUS
  Filled 2013-09-14 (×3): qty 50

## 2013-09-14 MED ORDER — ONDANSETRON HCL 4 MG/2ML IJ SOLN: 4.0000 mg | Freq: Four times a day (QID) | INTRAMUSCULAR | Status: DC | PRN

## 2013-09-14 MED ORDER — SODIUM CHLORIDE 0.9 % IR SOLN
80.0000 mg | Status: DC
  Filled 2013-09-14: qty 2

## 2013-09-14 MED ORDER — LIDOCAINE HCL (PF) 1 % IJ SOLN
INTRAMUSCULAR | Status: AC
  Filled 2013-09-14: qty 60

## 2013-09-14 MED ORDER — MIDAZOLAM HCL 5 MG/5ML IJ SOLN
INTRAMUSCULAR | Status: AC
  Filled 2013-09-14: qty 5

## 2013-09-14 MED ORDER — CEFAZOLIN SODIUM-DEXTROSE 2-3 GM-% IV SOLR
2.0000 g | INTRAVENOUS | Status: DC
  Filled 2013-09-14: qty 50

## 2013-09-14 MED ORDER — FENTANYL CITRATE 0.05 MG/ML IJ SOLN
INTRAMUSCULAR | Status: AC
  Filled 2013-09-14: qty 2

## 2013-09-14 MED ORDER — ACETAMINOPHEN 325 MG PO TABS: 325.0000 mg | ORAL_TABLET | ORAL | Status: DC | PRN

## 2013-09-14 NOTE — Progress Notes (Signed)
[facility] PROGRESS NOTE   [name] ZOX:[id number] DOB: [birth year] DOA: 09/10/2013 PCP: Stacy Showers, MD  Brief narrative: [name] is an 79 y.o. female with PMH of diabetes, hypertension, CAD, PAF managed with several oh, status post aVR who was admitted on 09/10/2013 with GI bleeding.  Assessment/Plan: Principal Problem:   GI bleeding / acute blood loss anemia / internal hemorrhoids with prolapse and bleeding The patient was admitted to the SDU and monitored closely with serial hemoglobin checks. She was kept n.p.o. and placed on IV PPI therapy and IV fluids. Xarelto and aspirin were held. Seen by Dr. Loreta Ave of gastroenterology on 09/10/2013. GI bleeding thought to be from hemorrhoids, so Anusol was started. Patient declined further assessment with colonoscopy. Active Problems:   Thrombocytopenia  Mild and chronic.   Hypokalemia Potassium replaced.    Morbid obesity Weight loss encouraged. BMI 41.99 kg/(m^2).   Coronary artery disease History of CABG. Stable with no anginal symptoms.   Systolic and diastolic CHF, chronic 2-D echo done 02/07/2013, EF 45-50%. Diuretics held on admission secondary to hypotension. We'll resume Lasix, but hold spironolactone secondary to hyperkalemia. Monitor renal function closely with resumption of Lasix.   ALZHEIMERS DISEASE Aricept held on admission secondary to bradycardia.   PAROXYSMAL ATRIAL FIBRILLATION / bradycardia / sick sinus syndrome Beta blocker held on admission secondary to bradycardia. She was given IV glucagon. Evaluated by cardiologist on 09/10/2013 and by electrophysiologist on 09/11/2013.  Continue low-dose amiodarone.  Eliquis (2.5 mg BID) recommended by cardiology given her CHADS2VASC score of at least 6, which was started on 09/12/2013 (currently on hold in anticipation of pacemaker implantation). Heart rate continues to be persistently low, in the 40s over the past 24 hours. To have pacemaker implanted today.   DM  (diabetes mellitus) with neurological complications Last hemoglobin A1c 6.8% (06/2013).  Lantus held on admission secondary to n.p.o. status. Diet and Lantus resumed along with SSI. Currently on Lantus 24 units daily and insulin sensitive SSI 3 times a day with 3 units of meal coverage. CBGs 96-181.    Acute renal failure in the setting of stage III chronic kidney disease Baseline creatinine around 1.2. Creatinine 1.64 on admission, now back to baseline values.  Code Status: Full. Family Communication: No family currently at the bedside.  Blaine Hamper [phone]) called and updated by phone. Disposition Plan: SNF vs 24/7 care at home.   IV access:  Peripheral IV  Medical Consultants:  Cardiology  Gastroenterology  Other Consultants:  Physical therapy: 24-hour supervision versus SNF recommended.  Anti-infectives:  None.  HPI/Subjective: [name] continues to deny chest pain, dyspnea, nausea, vomiting, and tells me her appetite is good and her bowels are moving. Sleepy at times.  Objective: Filed Vitals:   09/13/13 0635 09/13/13 1535 09/13/13 2049 09/14/13 0515  BP: 121/46 131/33 140/54 133/62  Pulse: 43 43 46 42  Temp: 97.6 F (36.4 C) 98.1 F (36.7 C) 98.2 F (36.8 C) 98 F (36.7 C)  TempSrc: Oral Oral Oral Oral  Resp: 20 18 18 18   Height:      Weight: 109.4 kg (241 lb 2.9 oz)   107.3 kg (236 lb 8.9 oz)  SpO2: 97% 97% 96% 95%    Intake/Output Summary (Last 24 hours) at 09/14/13 0714 Last data filed at 09/14/13 0548  Gross per 24 hour  Intake    720 ml  Output   2203 ml  Net  -1483 ml    Exam: Gen:  NAD Cardiovascular:  Bradycardic,  No M/R/G Respiratory:  Lungs CTAB Gastrointestinal:  Abdomen soft, NT/ND, + BS Extremities:  No C/E/C  Data Reviewed: Basic Metabolic Panel:  Recent Labs Lab 09/10/13 1325 09/11/13 0510 09/12/13 0338 09/13/13 0348 09/14/13 0400  NA 136 141 137 138 141  K 4.5 3.1* 4.3 5.2* 4.3  CL 98 103 102 105 107  CO2 27  27 25 26 25   GLUCOSE 283* 81 186* 200* 89  BUN 50* 41* 34* 35* 30*  CREATININE 1.64* 1.48* 1.37* 1.34* 1.13*  CALCIUM 9.0 8.8 8.7 8.6 8.6   GFR Estimated Creatinine Clearance: 48.2 ml/min (by C-G formula based on Cr of 1.13). Liver Function Tests:  Recent Labs Lab 09/10/13 1325  AST 22  ALT 13  ALKPHOS 100  BILITOT 1.0  PROT 7.2  ALBUMIN 3.4*   Coagulation profile  Recent Labs Lab 09/10/13 1325  INR 1.70*    CBC:  Recent Labs Lab 09/10/13 1325  09/11/13 0510 09/11/13 1055 09/11/13 2238 09/12/13 0338 09/13/13 0348  WBC 6.4  --  8.0  --  6.2 6.8 6.8  NEUTROABS 4.4  --   --   --   --   --   --   HGB 11.8*  < > 10.9* 10.3* 10.8* 11.1* 11.0*  HCT 34.9*  < > 33.1* 30.8* 33.5* 33.8* 33.6*  MCV 94.3  --  96.8  --  97.7 96.3 98.2  PLT 137*  --  130*  --  129* 126* 122*  < > = values in this interval not displayed. Cardiac Enzymes:  Recent Labs Lab 09/10/13 1325  TROPONINI <0.30   BNP (last 3 results)  Recent Labs  02/04/13 1050 03/13/13 1345 07/23/13 1905  PROBNP 23347.0* 6455.0* 6746.0*   CBG:  Recent Labs Lab 09/13/13 0619 09/13/13 1129 09/13/13 1640 09/13/13 2136 09/14/13 0559  GLUCAP 162* 150* 138* 181* 96   Microbiology Recent Results (from the past 240 hour(s))  MRSA PCR SCREENING     Status: None   Collection Time    09/10/13  6:20 PM      Result Value Range Status   MRSA by PCR NEGATIVE  NEGATIVE Final   Comment:            The GeneXpert MRSA Assay (FDA     approved for NASAL specimens     only), is one component of a     comprehensive MRSA colonization     surveillance program. It is not     intended to diagnose MRSA     infection nor to guide or     monitor treatment for     MRSA infections.     Procedures and Diagnostic Studies: Dg Chest 2 View  09/10/2013   CLINICAL DATA:  Coronary artery disease.  Congestive heart failure.  EXAM: CHEST  2 VIEW  COMPARISON:  07/23/2013.  FINDINGS: Cardiomegaly. Prior CABG. Bilateral  airspace opacity likely congestive heart failure although bilateral pneumonia not excluded. Significant worsening aeration from priors. Calcified tortuous aorta.  IMPRESSION: Cardiomegaly with suspected congestive heart failure.   Electronically Signed   By: Davonna Belling M.D.   On: 09/10/2013 14:19    Scheduled Meds: . amiodarone  100 mg Oral Daily  . apixaban  2.5 mg Oral BID  .  ceFAZolin (ANCEF) IV  2 g Intravenous On Call  . furosemide  40 mg Oral q1800  . furosemide  80 mg Oral Q breakfast  . gabapentin  300 mg Oral Daily  . gabapentin  600 mg  Oral QHS  . gentamicin irrigation  80 mg Irrigation On Call  . hydrocortisone  25 mg Rectal BID  . insulin aspart  0-5 Units Subcutaneous QHS  . insulin aspart  0-9 Units Subcutaneous TID WC  . insulin aspart  3 Units Subcutaneous TID WC  . insulin glargine  24 Units Subcutaneous QHS  . nystatin   Topical BID  . PARoxetine  40 mg Oral Daily  . pneumococcal 23 valent vaccine  0.5 mL Intramuscular Tomorrow-1000  . sodium chloride  3 mL Intravenous Q12H  . spironolactone  12.5 mg Oral BID   Continuous Infusions: . sodium chloride      Time spent: 25 minutes.    LOS: 4 days   Aritzel Krusemark  [facility] Pager [phone].   *Please note that the hospitalists switch teams on Wednesdays. Please call the flow manager at [phone] if you are having difficulty reaching the hospitalist taking care of this patient as she can update you and provide the most up-to-date pager number of provider caring for the patient. If 8PM-8AM, please contact night-coverage at [email or web address], password [facility]  09/14/2013, 7:14 AM

## 2013-09-14 NOTE — Progress Notes (Signed)
UR completed Balian Schaller K. Adrianne Shackleton, RN, BSN, MSHL, CCM  09/14/2013 4:40 PM

## 2013-09-14 NOTE — Progress Notes (Signed)
Patient: [name] [facility] Date of Encounter: 09/14/2013, 7:44 AM Admit date: 09/10/2013     Subjective  [name] has no new complaints.    Objective  Physical Exam: Vitals: BP 133/62  Pulse 42  Temp(Src) 98 F (36.7 C) (Oral)  Resp 18  Ht 5\' 3"  (1.6 m)  Wt 236 lb 8.9 oz (107.3 kg)  BMI 41.91 kg/m2  SpO2 95% General: Well developed 79 year old female in no acute distress. Neck: Supple. JVD not elevated. Lungs: Clear bilaterally to auscultation without wheezes, rales, or rhonchi. Breathing is unlabored. Heart: Bradycardic, regular S1 S2 without murmurs, rubs, or gallops.  Abdomen: Soft, non-distended. Extremities: No clubbing or cyanosis. No edema.  Distal pedal pulses are 2+ and equal bilaterally. Neuro: Alert and oriented X 3. Moves all extremities spontaneously. No focal deficits.  Intake/Output:  Intake/Output Summary (Last 24 hours) at 09/14/13 0744 Last data filed at 09/14/13 0736  Gross per 24 hour  Intake    720 ml  Output   2328 ml  Net  -1608 ml    Inpatient Medications:  . amiodarone  100 mg Oral Daily  . apixaban  2.5 mg Oral BID  .  ceFAZolin (ANCEF) IV  2 g Intravenous On Call  . furosemide  40 mg Oral q1800  . furosemide  80 mg Oral Q breakfast  . gabapentin  300 mg Oral Daily  . gabapentin  600 mg Oral QHS  . gentamicin irrigation  80 mg Irrigation On Call  . hydrocortisone  25 mg Rectal BID  . insulin aspart  0-5 Units Subcutaneous QHS  . insulin aspart  0-9 Units Subcutaneous TID WC  . insulin aspart  3 Units Subcutaneous TID WC  . insulin glargine  24 Units Subcutaneous QHS  . nystatin   Topical BID  . PARoxetine  40 mg Oral Daily  . pneumococcal 23 valent vaccine  0.5 mL Intramuscular Tomorrow-1000  . sodium chloride  3 mL Intravenous Q12H  . spironolactone  12.5 mg Oral BID   . sodium chloride      Labs:  Recent Labs  09/13/13 0348 09/14/13 0400  NA 138 141  K 5.2* 4.3  CL 105 107  CO2 26 25  GLUCOSE 200* 89  BUN 35* 30*    CREATININE 1.34* 1.13*  CALCIUM 8.6 8.6    Recent Labs  09/12/13 0338 09/13/13 0348  WBC 6.8 6.8  HGB 11.1* 11.0*  HCT 33.8* 33.6*  MCV 96.3 98.2  PLT 126* 122*    Radiology/Studies: Dg Chest 2 View 09/10/2013   CLINICAL DATA:  Coronary artery disease.  Congestive heart failure.  EXAM: CHEST  2 VIEW  COMPARISON:  07/23/2013.  FINDINGS: Cardiomegaly. Prior CABG. Bilateral airspace opacity likely congestive heart failure although bilateral pneumonia not excluded. Significant worsening aeration from priors. Calcified tortuous aorta.   IMPRESSION: Cardiomegaly with suspected congestive heart failure.    Electronically Signed   By: Davonna Belling M.D.   On: 09/10/2013 14:19   Transesophageal echocardiogram: May 2014 Study Conclusions - Left ventricle: The cavity size was normal. Wall thickness was normal. Systolic function was mildly reduced. The estimated ejection fraction was in the range of 45% to 50%. - Aortic valve: A prosthesis was present and functioning normally. The prosthesis had a normal range of motion. The sewing ring appeared normal, had no rocking motion, and showed no evidence of dehiscence. - Mitral valve: Moderately calcified annulus. Mildly thickened leaflets . No evidence of vegetation. Mild regurgitation. -  Left atrium: No evidence of thrombus in the atrial cavity or appendage. No evidence of thrombus in the appendage. - Right atrium: No evidence of thrombus in the atrial cavity or appendage. - Atrial septum: No defect or patent foramen ovale was identified. - Tricuspid valve: No evidence of vegetation. No evidence of vegetation. - Pulmonic valve: No evidence of vegetation. - Pulmonary arteries: Systolic pressure was moderately increased. - Superior vena cava: The study excluded a thrombus. Impression: - No evidence of endocarditis. Vegetation is absent.  Telemetry: sinus bradycardia with occasional PACs   Assessment and Plan  1. Sinus node  dysfunction / SSS - symptomatic with dizziness and weakness - rates do not significantly increase with ambulation - for PPM implantation today 2. PAF - maintaining SR on low dose amiodarone - Eliquis on hold for PPM implant (last dose yesterday at 10 AM) 3. Chronic diastolic HF - stable - no changes today  Dr. Ladona Ridgel to see Signed, [name]  EP Attending   Patient seen and examined. Agree with above exam, assessment and plan. Will proceed with PM implant.  Leonia Reeves.D.

## 2013-09-14 NOTE — Clinical Social Work Placement (Addendum)
    Clinical Social Work Department CLINICAL SOCIAL WORK PLACEMENT NOTE  Patient:  [name], [name]  Account Number:  [id number] Admit date:  09/10/2013  Clinical Social Worker:  Cherre Blanc, [place]  Date/time:  09/13/2013 04:30 PM  Clinical Social Work is seeking post-discharge placement for this patient at the following level of care:   SKILLED NURSING   (*CSW will update this form in Epic as items are completed)   09/13/2013  Patient/family provided with [facility] Department of Clinical Social Work's list of facilities offering this level of care within the geographic area requested by the patient (or if unable, by the patient's family).  09/13/2013  Patient/family informed of their freedom to choose among providers that offer the needed level of care, that participate in Medicare, Medicaid or managed care program needed by the patient, have an available bed and are willing to accept the patient.  09/13/2013  Patient/family informed of [facility]' ownership interest in [facility], as well as of the fact that they are under no obligation to receive care at this facility.  PASARR submitted to EDS on 09/13/2013 PASARR number received from [employer]DS on 09/13/2013  FL2 transmitted to all facilities in geographic area requested by pt/family on  09/13/2013 FL2 transmitted to all facilities within larger geographic area on   Patient informed that his/her managed care company has contracts with or will negotiate with  certain facilities, including the following:   [employer]-  Clinical request submitted via Provider Link     Patient/family informed of bed offers received:  09/14/2013 Patient chooses bed at [facility] AND [facility] Physician recommends and patient chooses bed at    Patient to be transferred to [facility] on  09/17/2013 Patient to be transferred to facility by Ambulance  Sharin Mons)  The following physician request  were entered in Epic:   Additional Comments: 09/17/13  Ok per MD for d/c today to SNF.  Ok per patient, family and facility. Nursing to call report.  No further CSW needs identified. CSW signing off.  Lorri Frederick. Miette Molenda, LCSWA  [phone]

## 2013-09-14 NOTE — CV Procedure (Signed)
DDD PM insertion via the left subclavian vein without immediate complication. Z#[id number].

## 2013-09-14 NOTE — Progress Notes (Signed)
PT Cancellation Note  Patient Details Name: TED LEONHART MRN: [id number] DOB: 12-31-1932   Cancelled Treatment:    Reason Eval/Treat Not Completed: Medical issues which prohibited therapy (pt with HR 36 and awaiting pacemaker today. Will await pacemaker prior to continued  therapyt)   Toney Sang [facility] 09/14/2013, 8:38 AM Delaney Meigs, PT [phone]

## 2013-09-14 NOTE — Plan of Care (Signed)
Problem: Consults Goal: Permanent Pacemaker Patient Education (See Patient Education module for education specifics.) Outcome: Progressing Patient given handout on permanent pacemaker education and post pacemaker placement care. Patient states RN will need to go over information with daughter in the morning.

## 2013-09-14 NOTE — Progress Notes (Addendum)
    Subjective:  Laying flat, sleeping, awakens well. No SOB.   Objective:  Vital Signs in the last 24 hours: Temp:  [98 F (36.7 C)-98.2 F (36.8 C)] 98 F (36.7 C) (12/19 0515) Pulse Rate:  [42-46] 42 (12/19 0515) Resp:  [18] 18 (12/19 0515) BP: (131-140)/(33-62) 133/62 mmHg (12/19 0515) SpO2:  [95 %-97 %] 95 % (12/19 0515) Weight:  [236 lb 8.9 oz (107.3 kg)] 236 lb 8.9 oz (107.3 kg) (12/19 0515)  Intake/Output from previous day: 12/18 0701 - 12/19 0700 In: 720 [P.O.:720] Out: 2203 [Urine:2200; Stool:3]   Physical Exam: General: Elderly in no acute distress. Head:  Normocephalic and atraumatic. Lungs: Clear to auscultation and percussion. Heart: Brady RR.  No murmur, rubs or gallops.  Abdomen: soft, obese Extremities: No clubbing or cyanosis. No edema. Neurologic: Alert and oriented x 3.    Lab Results:  Recent Labs  09/12/13 0338 09/13/13 0348  WBC 6.8 6.8  HGB 11.1* 11.0*  PLT 126* 122*    Recent Labs  09/13/13 0348 09/14/13 0400  NA 138 141  K 5.2* 4.3  CL 105 107  CO2 26 25  GLUCOSE 200* 89  BUN 35* 30*  CREATININE 1.34* 1.13*   Telemetry: SB 30-40 Personally viewed.   Assessment/Plan:  Principal Problem:   GI bleeding Active Problems:   OBESITY, MORBID   ALZHEIMERS DISEASE   PAROXYSMAL ATRIAL FIBRILLATION   Internal hemorrhoids with prolapse & bleeding   GIB (gastrointestinal bleeding)   Acute blood loss anemia   DM (diabetes mellitus)   Bradycardia   Systolic and diastolic CHF, chronic   Hypokalemia   Thrombocytopenia, unspecified  1) Symptomatic bradycardia  - hopefully pacer today will help with symptoms.   - she does have long standing deconditioning however.   2) PAF  - amio 100mg  QD  - restart Eliquis when able.   3) Chronic diastolic HF  - stable. Restarted aldactone and lasix 12/18  4) GIB  - stable H/H. Hemorrhoidal.   5) CAD   - stable.   6) AVR  - bioprosthetic  - stable    Pedram Goodchild 09/14/2013,  9:17 AM

## 2013-09-14 NOTE — Progress Notes (Signed)
Spoke to Dr. Darnelle Catalan regarding patients blood sugar being 73 and patient remaining NPO due to Pacemaker placement. Patient denies any abnormal weakness, sweating or fatigue. Dr. Darnelle Catalan states to just monitor patient.  Will continue to monitor.

## 2013-09-15 ENCOUNTER — Inpatient Hospital Stay (HOSPITAL_COMMUNITY): Payer: Medicare Other

## 2013-09-15 DIAGNOSIS — E1149 Type 2 diabetes mellitus with other diabetic neurological complication: Secondary | ICD-10-CM

## 2013-09-15 DIAGNOSIS — D696 Thrombocytopenia, unspecified: Secondary | ICD-10-CM

## 2013-09-15 LAB — CBC
HCT: 33.6 % — ABNORMAL LOW (ref 36.0–46.0)
Hemoglobin: 10.9 g/dL — ABNORMAL LOW (ref 12.0–15.0)
MCH: 31.4 pg (ref 26.0–34.0)
MCHC: 32.4 g/dL (ref 30.0–36.0)
MCV: 96.8 fL (ref 78.0–100.0)
RDW: 16 % — ABNORMAL HIGH (ref 11.5–15.5)

## 2013-09-15 LAB — BASIC METABOLIC PANEL
BUN: 25 mg/dL — ABNORMAL HIGH (ref 6–23)
CO2: 32 mEq/L (ref 19–32)
Creatinine, Ser: 1.07 mg/dL (ref 0.50–1.10)
GFR calc Af Amer: 56 mL/min — ABNORMAL LOW (ref >90)
GFR calc non Af Amer: 48 mL/min — ABNORMAL LOW (ref >90)
Potassium: 3.7 mEq/L (ref 3.5–5.1)

## 2013-09-15 LAB — GLUCOSE, CAPILLARY
Glucose-Capillary: 83 mg/dL (ref 70–99)
Glucose-Capillary: 132 mg/dL — ABNORMAL HIGH (ref 70–99)
Glucose-Capillary: 93 mg/dL (ref 70–99)
Glucose-Capillary: 125 mg/dL — ABNORMAL HIGH (ref 70–99)

## 2013-09-15 MED ORDER — DONEPEZIL HCL 10 MG PO TABS
10.0000 mg | ORAL_TABLET | Freq: Every day | ORAL | Status: DC
  Administered 2013-09-15 – 2013-09-16 (×2): 10 mg via ORAL
  Filled 2013-09-15 (×3): qty 1

## 2013-09-15 NOTE — Progress Notes (Addendum)
[facility] PROGRESS NOTE   [name] [facility] WUJ:[id number] DOB: 02-Jan-1933 DOA: 09/10/2013 PCP: [name], MD  Brief narrative: [name] is an 79 y.o. female with PMH of diabetes, hypertension, CAD, PAF managed with several oh, status post aVR who was admitted on 09/10/2013 with GI bleeding.  Assessment/Plan: Principal Problem:   GI bleeding / acute blood loss anemia / internal hemorrhoids with prolapse and bleeding The patient was admitted to the SDU and monitored closely with serial hemoglobin checks. She was kept n.p.o. and placed on IV PPI therapy and IV fluids. Xarelto and aspirin were held. Seen by Dr. Loreta Ave of gastroenterology on 09/10/2013. GI bleeding thought to be from hemorrhoids, so Anusol was started. Patient declined further assessment with colonoscopy. Hemoglobin stable throughout admission with no need for blood transfusion. Active Problems:   Thrombocytopenia  Mild and chronic.   Hypokalemia Potassium replaced.    Morbid obesity Weight loss encouraged. BMI 41.99 kg/(m^2).   Coronary artery disease History of CABG. Stable with no anginal symptoms.   Systolic and diastolic CHF, chronic 2-D echo done 02/07/2013, EF 45-50%. Diuretics held on admission secondary to hypotension. We'll resume Lasix, but hold spironolactone secondary to hyperkalemia. Renal function stable with resumption of Lasix.   ALZHEIMERS DISEASE Aricept held on admission secondary to bradycardia. Resume now that she has a pacemaker.   PAROXYSMAL ATRIAL FIBRILLATION / bradycardia / sick sinus syndrome Beta blocker held on admission secondary to bradycardia. She was given IV glucagon. Evaluated by cardiologist on 09/10/2013 and by electrophysiologist on 09/11/2013.  Continue low-dose amiodarone.  Eliquis (2.5 mg BID) recommended by cardiology given her CHADS2VASC score of at least 6, which was started on 09/12/2013 (currently on hold secondary to pacemaker implantation, resume when okay with  cardiology). DDD pacemaker inserted 09/14/2013, heart rate now in the 60s.   DM (diabetes mellitus) with neurological complications Last hemoglobin A1c 6.8% (06/2013).  Lantus held on admission secondary to n.p.o. status. Diet and Lantus resumed along with SSI. Currently on Lantus 24 units daily and insulin sensitive SSI 3 times a day with 3 units of meal coverage. CBGs 73-96.    Acute renal failure in the setting of stage III chronic kidney disease Baseline creatinine around 1.2. Creatinine 1.64 on admission, now back to baseline values.  Code Status: Full. Family Communication: No family currently at the bedside.  [name] [phone]) called and updated by phone. Disposition Plan: SNF vs 24/7 care at home.   IV access:  Peripheral IV  Medical Consultants:  Dr. Donato Schultz, Cardiology  Dr. Charna Elizabeth, Gastroenterology  Dr. Lewayne Bunting, Electrophysiologist  Other Consultants:  Physical therapy: 24-hour supervision versus SNF recommended.  Anti-infectives:  None.  HPI/Subjective: [name] continues to deny chest pain, dyspnea, nausea, vomiting, and tells me her appetite is good and her bowels are moving. Continues to be sleepy.  Has not gotten out of bed yet. No further blood in the stools.  Had a headache and nose was hurting, took Tylenol.  Objective: Filed Vitals:   09/14/13 1700 09/14/13 1758 09/14/13 2032 09/15/13 0510  BP: 145/117 121/41 126/59 119/39  Pulse: 60 60 62 60  Temp:   97.9 F (36.6 C) 97.7 F (36.5 C)  TempSrc:   Oral Oral  Resp:   19 19  Height:      Weight:    106.369 kg (234 lb 8 oz)  SpO2:   100% 99%    Intake/Output Summary (Last 24 hours) at 09/15/13 8657 Last data filed at  09/15/13 0610  Gross per 24 hour  Intake    502 ml  Output   3150 ml  Net  -2648 ml    Exam: Gen:  NAD Cardiovascular:  RRR, No M/R/G Respiratory:  Lungs CTAB Gastrointestinal:  Abdomen soft, NT/ND, + BS Extremities:  No C/E/C  Data Reviewed: Basic  Metabolic Panel:  Recent Labs Lab 09/11/13 0510 09/12/13 0338 09/13/13 0348 09/14/13 0400 09/15/13 0410  NA 141 137 138 141 141  K 3.1* 4.3 5.2* 4.3 3.7  CL 103 102 105 107 101  CO2 27 25 26 25  32  GLUCOSE 81 186* 200* 89 127*  BUN 41* 34* 35* 30* 25*  CREATININE 1.48* 1.37* 1.34* 1.13* 1.07  CALCIUM 8.8 8.7 8.6 8.6 8.4   GFR Estimated Creatinine Clearance: 50.6 ml/min (by C-G formula based on Cr of 1.07). Liver Function Tests:  Recent Labs Lab 09/10/13 1325  AST 22  ALT 13  ALKPHOS 100  BILITOT 1.0  PROT 7.2  ALBUMIN 3.4*   Coagulation profile  Recent Labs Lab 09/10/13 1325  INR 1.70*    CBC:  Recent Labs Lab 09/10/13 1325  09/11/13 0510 09/11/13 1055 09/11/13 2238 09/12/13 0338 09/13/13 0348 09/15/13 0410  WBC 6.4  --  8.0  --  6.2 6.8 6.8 5.6  NEUTROABS 4.4  --   --   --   --   --   --   --   HGB 11.8*  < > 10.9* 10.3* 10.8* 11.1* 11.0* 10.9*  HCT 34.9*  < > 33.1* 30.8* 33.5* 33.8* 33.6* 33.6*  MCV 94.3  --  96.8  --  97.7 96.3 98.2 96.8  PLT 137*  --  130*  --  129* 126* 122* 87*  < > = values in this interval not displayed. Cardiac Enzymes:  Recent Labs Lab 09/10/13 1325  TROPONINI <0.30   BNP (last 3 results)  Recent Labs  02/04/13 1050 03/13/13 1345 07/23/13 1905  PROBNP 23347.0* 6455.0* 6746.0*   CBG:  Recent Labs Lab 09/13/13 2136 09/14/13 0559 09/14/13 1101 09/14/13 1622 09/15/13 0603  GLUCAP 181* 96 73 96 83   Microbiology Recent Results (from the past 240 hour(s))  MRSA PCR SCREENING     Status: None   Collection Time    09/10/13  6:20 PM      Result Value Range Status   MRSA by PCR NEGATIVE  NEGATIVE Final   Comment:            The GeneXpert MRSA Assay (FDA     approved for NASAL specimens     only), is one component of a     comprehensive MRSA colonization     surveillance program. It is not     intended to diagnose MRSA     infection nor to guide or     monitor treatment for     MRSA infections.      Procedures and Diagnostic Studies: Dg Chest 2 View  09/10/2013   CLINICAL DATA:  Coronary artery disease.  Congestive heart failure.  EXAM: CHEST  2 VIEW  COMPARISON:  07/23/2013.  FINDINGS: Cardiomegaly. Prior CABG. Bilateral airspace opacity likely congestive heart failure although bilateral pneumonia not excluded. Significant worsening aeration from priors. Calcified tortuous aorta.  IMPRESSION: Cardiomegaly with suspected congestive heart failure.   Electronically Signed   By: Davonna Belling M.D.   On: 09/10/2013 14:19   Dg Chest 2 View  09/15/2013   CLINICAL DATA:  Post permanent pacemaker placement.  EXAM: CHEST  2 VIEW  COMPARISON:  Chest x-ray of September 10, 2013  FINDINGS: The current study is taken in a somewhat lordotic matter. The lungs are reasonably well inflated. The interstitial markings are increased bilaterally that but this is not entirely new. The cardiopericardial silhouette is enlarged in the left heart border is indistinct. There is no evidence of a pneumothorax nor definite evidence of a pleural effusion. The permanent pacemaker appears to be in reasonable radiographic position. A prosthetic aortic valve is demonstrated. The patient has undergone previous CABG.  IMPRESSION: 1. There is no evidence of an immediate postprocedure complication following placement of a permanent pacemaker. 2. The findings are consistent with congestive heart failure and pulmonary interstitial edema. This is likely both acute and chronic.   Electronically Signed   By: David  [place]   On: 09/15/2013 08:21    Scheduled Meds: . amiodarone  100 mg Oral Daily  . apixaban  2.5 mg Oral BID  . furosemide  40 mg Oral q1800  . furosemide  80 mg Oral Q breakfast  . gabapentin  300 mg Oral Daily  . gabapentin  600 mg Oral QHS  . hydrocortisone  25 mg Rectal BID  . insulin aspart  0-5 Units Subcutaneous QHS  . insulin aspart  0-9 Units Subcutaneous TID WC  . insulin aspart  3 Units Subcutaneous TID WC   . insulin glargine  24 Units Subcutaneous QHS  . nystatin   Topical BID  . PARoxetine  40 mg Oral Daily  . sodium chloride  3 mL Intravenous Q12H  . spironolactone  12.5 mg Oral BID   Continuous Infusions:    Time spent: 25 minutes.    LOS: 5 days   RAMA,CHRISTINA  [facility] Pager [phone].   *Please note that the hospitalists switch teams on Wednesdays. Please call the flow manager at [phone] if you are having difficulty reaching the hospitalist taking care of this patient as she can update you and provide the most up-to-date pager number of provider caring for the patient. If 8PM-8AM, please contact night-coverage at [email or web address], password [facility]  09/15/2013, 8:36 AM

## 2013-09-15 NOTE — Progress Notes (Signed)
Patient ID: [name], female   DOB: September 11, 1933, 79 y.o.   MRN: [id number] Subjective:  Stable, denies chest pain or sob.  Objective:  Vital Signs in the last 24 hours: Temp:  [97.4 F (36.3 C)-97.9 F (36.6 C)] 97.7 F (36.5 C) (12/20 0510) Pulse Rate:  [47-62] 60 (12/20 0510) Resp:  [18-19] 19 (12/20 0510) BP: (119-145)/(37-117) 119/39 mmHg (12/20 0510) SpO2:  [99 %-100 %] 99 % (12/20 0510) Weight:  [234 lb 8 oz (106.369 kg)] 234 lb 8 oz (106.369 kg) (12/20 0510)  Intake/Output from previous day: 12/19 0701 - 12/20 0700 In: 502 [P.O.:402; IV Piggyback:100] Out: 3275 [Urine:3275] Intake/Output from this shift:    Physical Exam: Chronically ill appearing NAD HEENT: Unremarkable Neck:  No JVD, no thyromegally Back:  No CVA tenderness Lungs:  Clear except for scattered rales, no wheezes, no hematoma HEART:  Regular rate rhythm, no murmurs, no rubs, no clicks Abd:  obese, positive bowel sounds, no organomegally, no rebound, no guarding Ext:  2 plus pulses, no edema, no cyanosis, no clubbing Skin:  No rashes no nodules Neuro:  CN II through XII intact, motor grossly intact  Lab Results:  Recent Labs  09/13/13 0348 09/15/13 0410  WBC 6.8 5.6  HGB 11.0* 10.9*  PLT 122* 87*    Recent Labs  09/14/13 0400 09/15/13 0410  NA 141 141  K 4.3 3.7  CL 107 101  CO2 25 32  GLUCOSE 89 127*  BUN 30* 25*  CREATININE 1.13* 1.07   No results found for this basename: TROPONINI, CK, MB,  in the last 72 hours Hepatic Function Panel No results found for this basename: PROT, ALBUMIN, AST, ALT, ALKPHOS, BILITOT, BILIDIR, IBILI,  in the last 72 hours No results found for this basename: CHOL,  in the last 72 hours No results found for this basename: PROTIME,  in the last 72 hours  Imaging: Dg Chest 2 View  09/15/2013   CLINICAL DATA:  Post permanent pacemaker placement.  EXAM: CHEST  2 VIEW  COMPARISON:  Chest x-ray of September 10, 2013  FINDINGS: The current study is taken in  a somewhat lordotic matter. The lungs are reasonably well inflated. The interstitial markings are increased bilaterally that but this is not entirely new. The cardiopericardial silhouette is enlarged in the left heart border is indistinct. There is no evidence of a pneumothorax nor definite evidence of a pleural effusion. The permanent pacemaker appears to be in reasonable radiographic position. A prosthetic aortic valve is demonstrated. The patient has undergone previous CABG.  IMPRESSION: 1. There is no evidence of an immediate postprocedure complication following placement of a permanent pacemaker. 2. The findings are consistent with congestive heart failure and pulmonary interstitial edema. This is likely both acute and chronic.   Electronically Signed   By: David  [place]   On: 09/15/2013 08:21    Cardiac Studies: Tele - nsr Assessment/Plan:  1. Symptomatic bradycardia 2. S/p PPM 3. Probable chronic diastolic CHF Rec: will check ppm. She may be discharge when other problems are controlled.   LOS: 5 days    Lewayne Bunting ,M.D.  09/15/2013, 8:28 AM

## 2013-09-15 NOTE — Op Note (Signed)
[name], [name]                ACCOUNT NO.:  [id number]  MEDICAL RECORD NO.:  [id number]  LOCATION:  4E10C                        FACILITY:  [facility]  PHYSICIAN:  Doylene Canning. Ladona Ridgel, MD    DATE OF BIRTH:  1932-11-25  DATE OF PROCEDURE:  09/14/2013 DATE OF DISCHARGE:                              OPERATIVE REPORT   PROCEDURE PERFORMED:  Insertion of a dual-chamber pacemaker.  INDICATION:  Symptomatic bradycardia secondary to sinus node dysfunction.  INTRODUCTION:  The patient is a 79 year old woman who has a history of multiple medical problems who was admitted to the hospital with bright red blood per rectum and dizziness and shortness of breath.  She was subsequently found to have severe bradycardia with sinus node dysfunction.  Previously, she had been on Xarelto for her paroxysmal atrial fibrillation.  In addition, she is status post aortic valve replacement.  She is now referred for insertion of a dual-chamber pacemaker secondary to severe sinus bradycardia due to sinus node dysfunction.  Of note, there are no reversible causes to treat her sinus node dysfunction.  It was also noted that her heart rate did not increase with ambulation despite having her beta-blockers held.  DESCRIPTION OF PROCEDURE:  After informed consent was obtained, the patient was taken to the diagnostic EP lab in a fasting state.  After usual preparation and draping, intravenous fentanyl and midazolam was given for sedation.  30 mL of lidocaine was infiltrated into the left infraclavicular region and a 5-cm incision was carried out over this region and electrocautery was utilized to dissect down to the fascial plane.  The left subclavian vein was punctured and the Medtronic model 5076 52 cm active fixation pacing lead, serial number [id number] was advanced into the right ventricle.  The Medtronic model 5076 45 cm active fixation pacing lead, serial number [id number] was advanced to the right atrium.   Mapping was first carried out in the right ventricle. At the final site, the R-waves measured 9 mV and pacing impedance was 900 ohms and threshold 1.3 V at 0.5 milliseconds.  There was large injury current with active fixation of lead and 10 V pacing did not stimulate the diaphragm.  With the ventricular lead in satisfactory position, attention was then turned to the atrial lead which was placed in anterolateral portion of the right atrium with the P-waves measured 2.8 mV.  The pacing impedance with the lead actively fixed was 576 ohms and a threshold 0.6 V at 0.5 milliseconds.  10 V pacing did not stimulate the diaphragm.  There was a satisfactory current of injury present with active fixation of the lead.  With both atrial and ventricular leads in satisfactory position, they were secured to the subpectoral fascia with a figure-of-eight silk suture.  The sewing sleeve was secured with silk suture.  Electrocautery was utilized to make a subcutaneous pocket.  Antibiotic irrigation was utilized to irrigate the pocket and electrocautery was utilized to assure hemostasis.  The Medtronic Adapta L dual-chamber pacemaker, serial number [id number] was connected to the atrial and ventricular leads and placed back in the subcutaneous pocket.  The pocket was irrigated with antibiotic irrigation.  The incision was  closed with 2-0 and 3-0 Vicryl. Benzoin and Steri-Strips were painted on the skin, a pressure dressing was applied, and the patient returned to her room in satisfactory condition.  COMPLICATIONS:  There were no immediate procedure complications.  RESULTS:  This demonstrate successful implantation of a Medtronic dual- chamber pacemaker in a patient with symptomatic bradycardia.     Doylene Canning. Ladona Ridgel, MD     GWT/MEDQ  D:  09/14/2013  T:  09/15/2013  Job:  [id number]  cc:   Jake Bathe, MD Hillis Range, MD Liana Crocker, PA-C

## 2013-09-16 DIAGNOSIS — I5042 Chronic combined systolic (congestive) and diastolic (congestive) heart failure: Secondary | ICD-10-CM

## 2013-09-16 DIAGNOSIS — I509 Heart failure, unspecified: Secondary | ICD-10-CM

## 2013-09-16 LAB — GLUCOSE, CAPILLARY
Glucose-Capillary: 105 mg/dL — ABNORMAL HIGH (ref 70–99)
Glucose-Capillary: 147 mg/dL — ABNORMAL HIGH (ref 70–99)

## 2013-09-16 NOTE — Progress Notes (Signed)
[facility] PROGRESS NOTE   [name] [facility] ZOX:[id number] DOB: 05/07/1933 DOA: 09/10/2013 PCP: [name], MD  Brief narrative: [name] is an 79 y.o. female with PMH of diabetes, hypertension, CAD, PAF managed with several oh, status post aVR who was admitted on 09/10/2013 with GI bleeding.  Assessment/Plan: Principal Problem:   GI bleeding / acute blood loss anemia / internal hemorrhoids with prolapse and bleeding The patient was admitted to the SDU and monitored closely with serial hemoglobin checks. She was kept n.p.o. and placed on IV PPI therapy and IV fluids. Xarelto and aspirin were held. Seen by Dr. Loreta Ave of gastroenterology on 09/10/2013. GI bleeding thought to be from hemorrhoids, so Anusol was started. Patient declined further assessment with colonoscopy. Hemoglobin stable throughout admission with no need for blood transfusion. Active Problems:   Thrombocytopenia  Mild and chronic.   Hypokalemia Potassium replaced.    Morbid obesity Weight loss encouraged. BMI 41.99 kg/(m^2).   Coronary artery disease History of CABG. Stable with no anginal symptoms.   Systolic and diastolic CHF, chronic 2-D echo done 02/07/2013, EF 45-50%. Diuretics held on admission secondary to hypotension. We'll resume Lasix, but hold spironolactone secondary to hyperkalemia. Renal function stable with resumption of Lasix.   ALZHEIMERS DISEASE Aricept held on admission secondary to bradycardia. Resume now that she has a pacemaker.   PAROXYSMAL ATRIAL FIBRILLATION / bradycardia / sick sinus syndrome Beta blocker held on admission secondary to bradycardia. She was given IV glucagon. Evaluated by cardiologist on 09/10/2013 and by electrophysiologist on 09/11/2013.  Continue low-dose amiodarone.  Eliquis (2.5 mg BID) recommended by cardiology given her CHADS2VASC score of at least 6, which was started on 09/12/2013 (temporarily held, now resumed).   DDD pacemaker inserted 09/14/2013, heart rate  now in the 60s.   DM (diabetes mellitus) with neurological complications Last hemoglobin A1c 6.8% (06/2013).  Lantus held on admission secondary to n.p.o. status. Diet and Lantus resumed along with SSI. Currently on Lantus 24 units daily and insulin sensitive SSI 3 times a day with 3 units of meal coverage. CBGs 73-96.    Acute renal failure in the setting of stage III chronic kidney disease Baseline creatinine around 1.2. Creatinine 1.64 on admission, now back to baseline values.  Code Status: Full. Family Communication: No family currently at the bedside.  Blaine Hamper [phone]) called and updated by phone. Disposition Plan: SNF vs 24/7 care at home.   IV access:  Peripheral IV  Medical Consultants:  Dr. Donato Schultz, Cardiology  Dr. Charna Elizabeth, Gastroenterology  Dr. Lewayne Bunting, Electrophysiologist  Other Consultants:  Physical therapy: 24-hour supervision versus SNF recommended.  Anti-infectives:  None.  HPI/Subjective: [name] continues to deny chest pain, dyspnea, nausea, vomiting, and tells me her appetite is good and her bowels are moving. Continues to be sleepy.  Has not gotten out of bed yet. No further blood in the stools.  Headache gone.  Left shoulder a bit sore.  Got up to [facility], but has not ambulated since PM placed.  Objective: Filed Vitals:   09/15/13 0510 09/15/13 1330 09/15/13 2155 09/16/13 0500  BP: 119/39 117/42 104/41 113/51  Pulse: 60 59 69 60  Temp: 97.7 F (36.5 C) 97.9 F (36.6 C) 98.1 F (36.7 C) 97.6 F (36.4 C)  TempSrc: Oral Oral Oral Oral  Resp: 19 18 20 20   Height:      Weight: 106.369 kg (234 lb 8 oz)     SpO2: 99% 100% 91% 96%    Intake/Output  Summary (Last 24 hours) at 09/16/13 1000 Last data filed at 09/16/13 0813  Gross per 24 hour  Intake    942 ml  Output   1327 ml  Net   -385 ml    Exam: Gen:  NAD Cardiovascular:  RRR, No M/R/G Respiratory:  Lungs CTAB Gastrointestinal:  Abdomen soft, NT/ND, +  BS Extremities:  No C/E/C  Data Reviewed: Basic Metabolic Panel:  Recent Labs Lab 09/11/13 0510 09/12/13 0338 09/13/13 0348 09/14/13 0400 09/15/13 0410  NA 141 137 138 141 141  K 3.1* 4.3 5.2* 4.3 3.7  CL 103 102 105 107 101  CO2 27 25 26 25  32  GLUCOSE 81 186* 200* 89 127*  BUN 41* 34* 35* 30* 25*  CREATININE 1.48* 1.37* 1.34* 1.13* 1.07  CALCIUM 8.8 8.7 8.6 8.6 8.4   GFR Estimated Creatinine Clearance: 50.6 ml/min (by C-G formula based on Cr of 1.07). Liver Function Tests:  Recent Labs Lab 09/10/13 1325  AST 22  ALT 13  ALKPHOS 100  BILITOT 1.0  PROT 7.2  ALBUMIN 3.4*   Coagulation profile  Recent Labs Lab 09/10/13 1325  INR 1.70*    CBC:  Recent Labs Lab 09/10/13 1325  09/11/13 0510 09/11/13 1055 09/11/13 2238 09/12/13 0338 09/13/13 0348 09/15/13 0410  WBC 6.4  --  8.0  --  6.2 6.8 6.8 5.6  NEUTROABS 4.4  --   --   --   --   --   --   --   HGB 11.8*  < > 10.9* 10.3* 10.8* 11.1* 11.0* 10.9*  HCT 34.9*  < > 33.1* 30.8* 33.5* 33.8* 33.6* 33.6*  MCV 94.3  --  96.8  --  97.7 96.3 98.2 96.8  PLT 137*  --  130*  --  129* 126* 122* 87*  < > = values in this interval not displayed. Cardiac Enzymes:  Recent Labs Lab 09/10/13 1325  TROPONINI <0.30   BNP (last 3 results)  Recent Labs  02/04/13 1050 03/13/13 1345 07/23/13 1905  PROBNP 23347.0* 6455.0* 6746.0*   CBG:  Recent Labs Lab 09/15/13 0603 09/15/13 1051 09/15/13 1628 09/15/13 2122 09/16/13 0632  GLUCAP 83 132* 93 125* 118*   Microbiology Recent Results (from the past 240 hour(s))  MRSA PCR SCREENING     Status: None   Collection Time    09/10/13  6:20 PM      Result Value Range Status   MRSA by PCR NEGATIVE  NEGATIVE Final   Comment:            The GeneXpert MRSA Assay (FDA     approved for NASAL specimens     only), is one component of a     comprehensive MRSA colonization     surveillance program. It is not     intended to diagnose MRSA     infection nor to guide or      monitor treatment for     MRSA infections.     Procedures and Diagnostic Studies: Dg Chest 2 View  09/10/2013   CLINICAL DATA:  Coronary artery disease.  Congestive heart failure.  EXAM: CHEST  2 VIEW  COMPARISON:  07/23/2013.  FINDINGS: Cardiomegaly. Prior CABG. Bilateral airspace opacity likely congestive heart failure although bilateral pneumonia not excluded. Significant worsening aeration from priors. Calcified tortuous aorta.  IMPRESSION: Cardiomegaly with suspected congestive heart failure.   Electronically Signed   By: Davonna Belling M.D.   On: 09/10/2013 14:19   Dg Chest 2 View  09/15/2013   CLINICAL DATA:  Post permanent pacemaker placement.  EXAM: CHEST  2 VIEW  COMPARISON:  Chest x-ray of September 10, 2013  FINDINGS: The current study is taken in a somewhat lordotic matter. The lungs are reasonably well inflated. The interstitial markings are increased bilaterally that but this is not entirely new. The cardiopericardial silhouette is enlarged in the left heart border is indistinct. There is no evidence of a pneumothorax nor definite evidence of a pleural effusion. The permanent pacemaker appears to be in reasonable radiographic position. A prosthetic aortic valve is demonstrated. The patient has undergone previous CABG.  IMPRESSION: 1. There is no evidence of an immediate postprocedure complication following placement of a permanent pacemaker. 2. The findings are consistent with congestive heart failure and pulmonary interstitial edema. This is likely both acute and chronic.   Electronically Signed   By: David  [place]   On: 09/15/2013 08:21    Scheduled Meds: . amiodarone  100 mg Oral Daily  . apixaban  2.5 mg Oral BID  . donepezil  10 mg Oral QHS  . furosemide  40 mg Oral q1800  . furosemide  80 mg Oral Q breakfast  . gabapentin  300 mg Oral Daily  . gabapentin  600 mg Oral QHS  . hydrocortisone  25 mg Rectal BID  . insulin aspart  0-5 Units Subcutaneous QHS  . insulin aspart   0-9 Units Subcutaneous TID WC  . insulin aspart  3 Units Subcutaneous TID WC  . insulin glargine  24 Units Subcutaneous QHS  . nystatin   Topical BID  . PARoxetine  40 mg Oral Daily  . sodium chloride  3 mL Intravenous Q12H  . spironolactone  12.5 mg Oral BID   Continuous Infusions:    Time spent: 25 minutes.    LOS: 6 days   [name]  [facility] Pager [phone].   *Please note that the hospitalists switch teams on Wednesdays. Please call the flow manager at [phone] if you are having difficulty reaching the hospitalist taking care of this patient as she can update you and provide the most up-to-date pager number of provider caring for the patient. If 8PM-8AM, please contact night-coverage at [email or web address], password [facility]  09/16/2013, 10:00 AM

## 2013-09-16 NOTE — Progress Notes (Addendum)
Patient ID: [name], female   DOB: 08/24/1933, 79 y.o.   MRN: [id number] Subjective:  Stable, denies chest pain or sob.  Objective:  Vital Signs in the last 24 hours: Temp:  [97.6 F (36.4 C)-98.1 F (36.7 C)] 97.6 F (36.4 C) (12/21 0500) Pulse Rate:  [59-69] 60 (12/21 0500) Resp:  [18-20] 20 (12/21 0500) BP: (104-117)/(41-51) 113/51 mmHg (12/21 0500) SpO2:  [91 %-100 %] 96 % (12/21 0500)  Intake/Output from previous day: 12/20 0701 - 12/21 0700 In: 702 [P.O.:702] Out: 1827 [Urine:1825; Stool:2] Intake/Output from this shift: Total I/O In: 240 [P.O.:240] Out: -   Physical Exam: stable appearing elderly woman, NAD HEENT: Unremarkable Neck:  No JVD, no thyromegally Back:  No CVA tenderness Lungs:  Clear except for scattered rales, no wheezes, no hematoma but small area of ecchymosis at PPM insertion site HEART:  Regular rate rhythm, no murmurs, no rubs, no clicks Abd:  obese, positive bowel sounds, no organomegally, no rebound, no guarding Ext:  2 plus pulses, no edema, no cyanosis, no clubbing Skin:  No rashes no nodules Neuro:  CN II through XII intact, motor grossly intact  Lab Results:  Recent Labs  09/15/13 0410  WBC 5.6  HGB 10.9*  PLT 87*    Recent Labs  09/14/13 0400 09/15/13 0410  NA 141 141  K 4.3 3.7  CL 107 101  CO2 25 32  GLUCOSE 89 127*  BUN 30* 25*  CREATININE 1.13* 1.07   No results found for this basename: TROPONINI, CK, MB,  in the last 72 hours Hepatic Function Panel No results found for this basename: PROT, ALBUMIN, AST, ALT, ALKPHOS, BILITOT, BILIDIR, IBILI,  in the last 72 hours No results found for this basename: CHOL,  in the last 72 hours No results found for this basename: PROTIME,  in the last 72 hours  Imaging: Dg Chest 2 View  09/15/2013   CLINICAL DATA:  Post permanent pacemaker placement.  EXAM: CHEST  2 VIEW  COMPARISON:  Chest x-ray of September 10, 2013  FINDINGS: The current study is taken in a somewhat lordotic  matter. The lungs are reasonably well inflated. The interstitial markings are increased bilaterally that but this is not entirely new. The cardiopericardial silhouette is enlarged in the left heart border is indistinct. There is no evidence of a pneumothorax nor definite evidence of a pleural effusion. The permanent pacemaker appears to be in reasonable radiographic position. A prosthetic aortic valve is demonstrated. The patient has undergone previous CABG.  IMPRESSION: 1. There is no evidence of an immediate postprocedure complication following placement of a permanent pacemaker. 2. The findings are consistent with congestive heart failure and pulmonary interstitial edema. This is likely both acute and chronic.   Electronically Signed   By: David  [place]   On: 09/15/2013 08:21    Cardiac Studies: Tele - nsr Assessment/Plan:  1. Symptomatic bradycardia 2. S/p PPM 3. Probable chronic diastolic CHF Rec: normal PPM function. She may be discharge when other problems are controlled. Agree with restart of Eliquis. High risk for stroke.  LOS: 6 days    Lewayne Bunting ,M.D.  09/16/2013, 8:40 AM

## 2013-09-17 DIAGNOSIS — E876 Hypokalemia: Secondary | ICD-10-CM

## 2013-09-17 DIAGNOSIS — D631 Anemia in chronic kidney disease: Secondary | ICD-10-CM

## 2013-09-17 DIAGNOSIS — F0280 Dementia in other diseases classified elsewhere without behavioral disturbance: Secondary | ICD-10-CM

## 2013-09-17 DIAGNOSIS — H6120 Impacted cerumen, unspecified ear: Secondary | ICD-10-CM | POA: Diagnosis present

## 2013-09-17 LAB — GLUCOSE, CAPILLARY
Glucose-Capillary: 135 mg/dL — ABNORMAL HIGH (ref 70–99)
Glucose-Capillary: 142 mg/dL — ABNORMAL HIGH (ref 70–99)
Glucose-Capillary: 99 mg/dL (ref 70–99)

## 2013-09-17 MED ORDER — CARBAMIDE PEROXIDE 6.5 % OT SOLN: 5.0000 [drp] | Freq: Two times a day (BID) | OTIC | Status: DC

## 2013-09-17 MED ORDER — CARBAMIDE PEROXIDE 6.5 % OT SOLN
5.0000 [drp] | Freq: Two times a day (BID) | OTIC | Status: DC
  Administered 2013-09-17: 5 [drp] via OTIC
  Filled 2013-09-17: qty 15

## 2013-09-17 MED ORDER — INSULIN ASPART 100 UNIT/ML ~~LOC~~ SOLN: 0.0000 [IU] | Freq: Three times a day (TID) | SUBCUTANEOUS | Status: DC

## 2013-09-17 MED ORDER — INSULIN ASPART 100 UNIT/ML ~~LOC~~ SOLN: 3.0000 [IU] | Freq: Three times a day (TID) | SUBCUTANEOUS | Status: DC

## 2013-09-17 MED ORDER — HYDROCORTISONE ACETATE 25 MG RE SUPP: 25.0000 mg | Freq: Two times a day (BID) | RECTAL | Status: DC | PRN

## 2013-09-17 MED ORDER — APIXABAN 2.5 MG PO TABS: 2.5000 mg | ORAL_TABLET | Freq: Two times a day (BID) | ORAL | Status: DC

## 2013-09-17 MED ORDER — INSULIN ASPART 100 UNIT/ML ~~LOC~~ SOLN: 0.0000 [IU] | Freq: Every day | SUBCUTANEOUS | Status: DC

## 2013-09-17 NOTE — Progress Notes (Signed)
Physical Therapy Treatment Patient Details Name: [name] MRN: [id number] DOB: Apr 09, 1933 Today's Date: 09/17/2013 Time: 1308-6578 PT Time Calculation (min): 25 min  PT Assessment / Plan / Recommendation  History of Present Illness Pt admitted with rectal bleeding and low HR; now s/p pacemaker placement   PT Comments   Showing good progress with activity tolerance post ICD placement; Continue to recommend SNF rehab for post-acute therapy needs to maximize independence and safety with mobility and ADLs prior to dc home; Pt is agreeable to short-term SNF; Notified SW   Follow Up Recommendations  SNF;Supervision/Assistance - 24 hour     Does the patient have the potential to tolerate intense rehabilitation     Barriers to Discharge        Equipment Recommendations  None recommended by PT    Recommendations for Other Services    Frequency Min 3X/week   Progress towards PT Goals Progress towards PT goals: Progressing toward goals  Plan Current plan remains appropriate    Precautions / Restrictions Precautions Precautions: Fall;ICD/Pacemaker   Pertinent Vitals/Pain L shoulder soreness, post pacemaker placement Did not rate; patient repositioned for comfort     Mobility  Transfers Transfers: Sit to Stand;Stand to Sit Sit to Stand: 4: Min guard;With upper extremity assist;From chair/3-in-1 Stand to Sit: 4: Min guard;To chair/3-in-1;With upper extremity assist Details for Transfer Assistance: v/c's for safe hand placement, increased time; Pt reports she has a lift chair at home Ambulation/Gait Ambulation/Gait Assistance: 4: Min assist Ambulation Distance (Feet): 100 Feet Assistive device: Rolling walker Ambulation/Gait Assistance Details: Noted unsteadiness with steps, dependent on UE support on RW; Antalgic gait, with heavy heel strike LLE Gait Pattern: Step-to pattern;Antalgic;Trunk flexed;Shuffle Gait velocity: extremely slow    Exercises     PT Diagnosis:    PT  Problem List:   PT Treatment Interventions:     PT Goals (current goals can now be found in the care plan section) Acute Rehab PT Goals Patient Stated Goal: home PT Goal Formulation: With patient Time For Goal Achievement: 09/25/13 Potential to Achieve Goals: Good  Visit Information  Last PT Received On: 09/17/13 Assistance Needed: +1 History of Present Illness: Pt admitted with rectal bleeding and low HR; now s/p pacemaker placement    Subjective Data  Subjective: Would really like to go straight home; but is agreeable to a SNF stay with the goal of getting home Patient Stated Goal: home   Cognition  Cognition Arousal/Alertness: Awake/alert Behavior During Therapy: WFL for tasks assessed/performed Overall Cognitive Status: Within Functional Limits for tasks assessed (for simple mobility)    Balance  Static Standing Balance Static Standing - Balance Support: Bilateral upper extremity supported Static Standing - Level of Assistance: 5: Stand by assistance Static Standing - Comment/# of Minutes: a few minutes for hygeine for rectal bleeding after commode use  End of Session PT - End of Session Equipment Utilized During Treatment: Gait belt Activity Tolerance: Patient tolerated treatment well Patient left: in chair;with call bell/phone within reach Nurse Communication: Mobility status   GP     Olen Pel [place], [place] [phone]  09/17/2013, 12:37 PM

## 2013-09-17 NOTE — Progress Notes (Addendum)
[facility] PROGRESS NOTE   Mairead Schwarzkopf [facility] ZOX:[id number] DOB: 01/12/1933 DOA: 09/10/2013 PCP: [name], MD  Brief narrative: [name] is an 79 y.o. female with PMH of diabetes, hypertension, CAD, PAF managed with Xarelto, status post aVR who was admitted on 09/10/2013 with GI bleeding.  Assessment/Plan: Principal Problem:   GI bleeding / acute blood loss anemia / internal hemorrhoids with prolapse and bleeding The patient was admitted to the SDU and monitored closely with serial hemoglobin checks. She was kept n.p.o. and placed on IV PPI therapy and IV fluids. Xarelto and aspirin were held. Seen by Dr. Loreta Ave of gastroenterology on 09/10/2013. GI bleeding thought to be from hemorrhoids, so Anusol was started. Patient declined further assessment with colonoscopy. Hemoglobin stable throughout admission with no need for blood transfusion. Active Problems:   Cerumen impaction Debrox to right ear canal BID.  F/U ENT.   Thrombocytopenia  Mild and chronic.   Hypokalemia Potassium replaced.    Morbid obesity Weight loss encouraged. BMI 41.99 kg/(m^2).   Coronary artery disease History of CABG. Stable with no anginal symptoms.   Systolic and diastolic CHF, chronic 2-D echo done 02/07/2013, EF 45-50%. Diuretics held on admission secondary to hypotension. Continue Lasix, but continued to hold spironolactone secondary to hyperkalemia. Renal function stable with resumption of Lasix.   ALZHEIMERS DISEASE Aricept held on admission secondary to bradycardia. Resume now that she has a pacemaker.   PAROXYSMAL ATRIAL FIBRILLATION / bradycardia / sick sinus syndrome Beta blocker held on admission secondary to bradycardia. She was given IV glucagon. Evaluated by cardiologist on 09/10/2013 and by electrophysiologist on 09/11/2013.  Continue low-dose amiodarone.  Eliquis (2.5 mg BID) recommended by cardiology given her CHADS2VASC score of at least 6, which was started on 09/12/2013  (temporarily held, now resumed).   DDD pacemaker inserted 09/14/2013, heart rate now in the 60s.   DM (diabetes mellitus) with neurological complications Last hemoglobin A1c 6.8% (06/2013).  Lantus held on admission secondary to n.p.o. status. Diet and Lantus resumed along with SSI. Currently on Lantus 24 units daily and insulin sensitive SSI 3 times a day with 3 units of meal coverage. CBGs 105-151.    Acute renal failure in the setting of stage III chronic kidney disease Baseline creatinine around 1.2. Creatinine 1.64 on admission, now back to baseline values.  Code Status: Full. Family Communication: No family currently at the bedside.  Blaine Hamper [phone]) called and updated by phone. Disposition Plan: SNF vs 24/7 care at home.   IV access:  Peripheral IV  Medical Consultants:  Dr. Donato Schultz, Cardiology  Dr. Charna Elizabeth, Gastroenterology  Dr. Lewayne Bunting, Electrophysiologist  Other Consultants:  Physical therapy: 24-hour supervision versus SNF recommended.  Anti-infectives:  None.  HPI/Subjective: [name] continues to deny chest pain, dyspnea, nausea, vomiting, and tells me her appetite is good and her bowels are moving.  Sitting up in chair today, less sleepy.  Getting up to [facility].  Complains of right ear "clicking".  Has seen Dr. Lazarus Salines for this in the past, was told she had "nerve damage".    Objective: Filed Vitals:   09/16/13 1300 09/16/13 2152 09/17/13 0615 09/17/13 0700  BP: 125/32 148/65 152/57 110/38  Pulse: 59 61 60 60  Temp: 98.4 F (36.9 C) 98.2 F (36.8 C) 97.9 F (36.6 C) 98.2 F (36.8 C)  TempSrc: Oral Oral Oral Oral  Resp: 20 20 20 20   Height:      Weight:      SpO2: 98% 96%  93% 96%    Intake/Output Summary (Last 24 hours) at 09/17/13 1040 Last data filed at 09/17/13 0858  Gross per 24 hour  Intake    966 ml  Output   1502 ml  Net   -536 ml    Exam: Gen:  NAD Right ear: Cerumen occludes ability to see TM Cardiovascular:   RRR, No M/R/G Respiratory:  Lungs CTAB Gastrointestinal:  Abdomen soft, NT/ND, + BS Extremities:  No C/E/C  Data Reviewed: Basic Metabolic Panel:  Recent Labs Lab 09/11/13 0510 09/12/13 0338 09/13/13 0348 09/14/13 0400 09/15/13 0410  NA 141 137 138 141 141  K 3.1* 4.3 5.2* 4.3 3.7  CL 103 102 105 107 101  CO2 27 25 26 25  32  GLUCOSE 81 186* 200* 89 127*  BUN 41* 34* 35* 30* 25*  CREATININE 1.48* 1.37* 1.34* 1.13* 1.07  CALCIUM 8.8 8.7 8.6 8.6 8.4   GFR Estimated Creatinine Clearance: 50.6 ml/min (by C-G formula based on Cr of 1.07). Liver Function Tests:  Recent Labs Lab 09/10/13 1325  AST 22  ALT 13  ALKPHOS 100  BILITOT 1.0  PROT 7.2  ALBUMIN 3.4*   Coagulation profile  Recent Labs Lab 09/10/13 1325  INR 1.70*    CBC:  Recent Labs Lab 09/10/13 1325  09/11/13 0510 09/11/13 1055 09/11/13 2238 09/12/13 0338 09/13/13 0348 09/15/13 0410  WBC 6.4  --  8.0  --  6.2 6.8 6.8 5.6  NEUTROABS 4.4  --   --   --   --   --   --   --   HGB 11.8*  < > 10.9* 10.3* 10.8* 11.1* 11.0* 10.9*  HCT 34.9*  < > 33.1* 30.8* 33.5* 33.8* 33.6* 33.6*  MCV 94.3  --  96.8  --  97.7 96.3 98.2 96.8  PLT 137*  --  130*  --  129* 126* 122* 87*  < > = values in this interval not displayed. Cardiac Enzymes:  Recent Labs Lab 09/10/13 1325  TROPONINI <0.30   BNP (last 3 results)  Recent Labs  02/04/13 1050 03/13/13 1345 07/23/13 1905  PROBNP 23347.0* 6455.0* 6746.0*   CBG:  Recent Labs Lab 09/16/13 0632 09/16/13 1111 09/16/13 1634 09/16/13 2132 09/17/13 0656  GLUCAP 118* 151* 105* 147* 135*   Microbiology Recent Results (from the past 240 hour(s))  MRSA PCR SCREENING     Status: None   Collection Time    09/10/13  6:20 PM      Result Value Range Status   MRSA by PCR NEGATIVE  NEGATIVE Final   Comment:            The GeneXpert MRSA Assay (FDA     approved for NASAL specimens     only), is one component of a     comprehensive MRSA colonization      surveillance program. It is not     intended to diagnose MRSA     infection nor to guide or     monitor treatment for     MRSA infections.     Procedures and Diagnostic Studies: Dg Chest 2 View  09/10/2013   CLINICAL DATA:  Coronary artery disease.  Congestive heart failure.  EXAM: CHEST  2 VIEW  COMPARISON:  07/23/2013.  FINDINGS: Cardiomegaly. Prior CABG. Bilateral airspace opacity likely congestive heart failure although bilateral pneumonia not excluded. Significant worsening aeration from priors. Calcified tortuous aorta.  IMPRESSION: Cardiomegaly with suspected congestive heart failure.   Electronically Signed   By: Jonny Ruiz  Curnes M.D.   On: 09/10/2013 14:19   Dg Chest 2 View  09/15/2013   CLINICAL DATA:  Post permanent pacemaker placement.  EXAM: CHEST  2 VIEW  COMPARISON:  Chest x-ray of September 10, 2013  FINDINGS: The current study is taken in a somewhat lordotic matter. The lungs are reasonably well inflated. The interstitial markings are increased bilaterally that but this is not entirely new. The cardiopericardial silhouette is enlarged in the left heart border is indistinct. There is no evidence of a pneumothorax nor definite evidence of a pleural effusion. The permanent pacemaker appears to be in reasonable radiographic position. A prosthetic aortic valve is demonstrated. The patient has undergone previous CABG.  IMPRESSION: 1. There is no evidence of an immediate postprocedure complication following placement of a permanent pacemaker. 2. The findings are consistent with congestive heart failure and pulmonary interstitial edema. This is likely both acute and chronic.   Electronically Signed   By: David  [place]   On: 09/15/2013 08:21    Scheduled Meds: . amiodarone  100 mg Oral Daily  . apixaban  2.5 mg Oral BID  . donepezil  10 mg Oral QHS  . furosemide  40 mg Oral q1800  . furosemide  80 mg Oral Q breakfast  . gabapentin  300 mg Oral Daily  . gabapentin  600 mg Oral QHS  .  hydrocortisone  25 mg Rectal BID  . insulin aspart  0-5 Units Subcutaneous QHS  . insulin aspart  0-9 Units Subcutaneous TID WC  . insulin aspart  3 Units Subcutaneous TID WC  . insulin glargine  24 Units Subcutaneous QHS  . nystatin   Topical BID  . PARoxetine  40 mg Oral Daily  . sodium chloride  3 mL Intravenous Q12H  . spironolactone  12.5 mg Oral BID   Continuous Infusions:    Time spent: 25 minutes.    LOS: 7 days   RAMA,CHRISTINA  [facility] Pager [phone].   *Please note that the hospitalists switch teams on Wednesdays. Please call the flow manager at [phone] if you are having difficulty reaching the hospitalist taking care of this patient as she can update you and provide the most up-to-date pager number of provider caring for the patient. If 8PM-8AM, please contact night-coverage at [email or web address], password [facility]  09/17/2013, 10:40 AM

## 2013-09-17 NOTE — Progress Notes (Signed)
Patient's IV and telemetry has been discontinued; reviewed patient's discharge instructions with patient and instructions will also be sent in discharge package.  Patient will be discharged to [facility] and report has been given to nurse Aram Beecham. Lorretta Harp RN

## 2013-09-17 NOTE — Progress Notes (Signed)
      Subjective:  No complaints in chair. Waiting to hear when she can leave. No SOB. A paced. Pacer done last Friday  Objective:  Vital Signs in the last 24 hours: Temp:  [97.9 F (36.6 C)-98.4 F (36.9 C)] 97.9 F (36.6 C) (12/22 0615) Pulse Rate:  [59-61] 60 (12/22 0615) Resp:  [20] 20 (12/22 0615) BP: (125-152)/(32-65) 152/57 mmHg (12/22 0615) SpO2:  [93 %-98 %] 93 % (12/22 0615)  Intake/Output from previous day: 12/21 0701 - 12/22 0700 In: 483 [P.O.:480; I.V.:3] Out: 1431 [Urine:1430; Stool:1]   Physical Exam: General: Well developed, well nourished, in no acute distress. Head:  Normocephalic and atraumatic. Lungs: Clear to auscultation and percussion. Heart: RRR.  No murmur, rubs or gallops. Pacer c/d/i Abdomen: soft, non-tender, positive bowel sounds. Extremities: No clubbing or cyanosis. No edema. Neurologic: Alert and oriented x 3.    Lab Results:  Recent Labs  09/15/13 0410  WBC 5.6  HGB 10.9*  PLT 87*    Recent Labs  09/15/13 0410  NA 141  K 3.7  CL 101  CO2 32  GLUCOSE 127*  BUN 25*  CREATININE 1.07   .   Telemetry: A - paced. BBB. Personally viewed  Assessment/Plan:  Principal Problem:   GI bleeding Active Problems:   OBESITY, MORBID   ALZHEIMERS DISEASE   PAROXYSMAL ATRIAL FIBRILLATION   Internal hemorrhoids with prolapse & bleeding   GIB (gastrointestinal bleeding)   Acute blood loss anemia   DM (diabetes mellitus)   Bradycardia   Systolic and diastolic CHF, chronic   Hypokalemia   Thrombocytopenia, unspecified  1) Severe bradycardia - s/p PACER. Appreciate EP team assistance.   2) Chronic diastolic HF - Adactone, lasix, home dose. Fluid restrict. Prior multiple admits with SOB.   3) GIB - stable. Probable hemorrhoidal bleeding. Started Eliquis 2.5mg  BID.   4) AVR - bioprosthetic  5) CABG - no angina, stable  6) Morbid obesity - weight loss  7) CKD 3 - stable.   8) History of hypokalemia  - stable  9) PAF - was  on Xarelto. Now off with GIB. Currently SB. Eliquis 2.5 BID. Dementia is written as dx but this is mild.   OK for DC to SNF when able.     SKAINS, MARK 09/17/2013, 8:40 AM

## 2013-09-17 NOTE — Progress Notes (Signed)
Patient is being transported to [facility]; patient is stable. Lorretta Harp RN

## 2013-09-17 NOTE — Discharge Summary (Signed)
Physician Discharge Summary  Rocio Roam [facility] ZOX:[id number] DOB: Aug 13, 1933 DOA: 09/10/2013  PCP: [name], MD  Admit date: 09/10/2013 Discharge date: 09/17/2013  Recommendations for Outpatient Follow-up:  1. F/U with cardiology/electrophysiologist as scheduled. 2. Recheck potassium in 2-3 days to ensure no further evidence of hyperkalemia.  Discharge Diagnoses:  Principal Problem:    GI bleeding Active Problems:    OBESITY, MORBID    ALZHEIMERS DISEASE    PAROXYSMAL ATRIAL FIBRILLATION    Internal hemorrhoids with prolapse & bleeding    GIB (gastrointestinal bleeding)    Acute blood loss anemia    DM (diabetes mellitus)    Bradycardia    Systolic and diastolic CHF, chronic    Hypokalemia    Thrombocytopenia, unspecified    Cerumen impaction   Discharge Condition: Improved.  Diet recommendation: Low sodium, heart healthy, carb modified.  History of present illness:  [name] is an 79 y.o. female with PMH of diabetes, hypertension, CAD, PAF managed with Xarelto, status post aVR who was admitted on 09/10/2013 with GI bleeding.  Hospital Course by problem:  Principal Problem:  GI bleeding / acute blood loss anemia / internal hemorrhoids with prolapse and bleeding  The patient was admitted to the SDU and monitored closely with serial hemoglobin checks. She was kept n.p.o. and placed on IV PPI therapy and IV fluids. Xarelto and aspirin were held. Seen by Dr. Loreta Ave of gastroenterology on 09/10/2013. GI bleeding thought to be from hemorrhoids, so Anusol was started. Patient declined further assessment with colonoscopy. Hemoglobin stable throughout admission with no need for blood transfusion.  Active Problems:  Cerumen impaction  Debrox to right ear canal BID. F/U ENT.  Thrombocytopenia  Mild and chronic.  Hypokalemia  Potassium replaced.  Morbid obesity  Weight loss encouraged. BMI 41.99 kg/(m^2).  Coronary artery disease  History of CABG. Stable  with no anginal symptoms.  Systolic and diastolic CHF, chronic  2-D echo done 02/07/2013, EF 45-50%. Diuretics held on admission secondary to hypotension. Continue Lasix and spironolactone. Renal function stable with resumption of Lasix.  ALZHEIMERS DISEASE  Aricept held on admission secondary to bradycardia. Resumed after pacemaker implanted. PAROXYSMAL ATRIAL FIBRILLATION / bradycardia / sick sinus syndrome  Beta blocker held on admission secondary to bradycardia. She was given IV glucagon. Evaluated by cardiologist on 09/10/2013 and by electrophysiologist on 09/11/2013. Continue low-dose amiodarone and Eliquis (2.5 mg BID). DDD pacemaker inserted 09/14/2013, heart rate now in the 60s.  DM (diabetes mellitus) with neurological complications  Last hemoglobin A1c 6.8% (06/2013). Discharged on 27 units of Lantus daily, insulin sensitive sliding scale with 3 units of meal coverage before each meal.  Acute renal failure in the setting of stage III chronic kidney disease  Baseline creatinine around 1.2. Creatinine 1.64 on admission, now back to baseline values.  Procedures:  Insertion of a dual-chamber pacemaker on 09/14/2013 by Dr. Ladona Ridgel.  Consultations:  Dr. Donato Schultz, Cardiology   Dr. Charna Elizabeth, Gastroenterology   Dr. Lewayne Bunting, Electrophysiologist   Discharge Exam: Filed Vitals:   09/17/13 0700  BP: 110/38  Pulse: 60  Temp: 98.2 F (36.8 C)  Resp: 20   Filed Vitals:   09/16/13 1300 09/16/13 2152 09/17/13 0615 09/17/13 0700  BP: 125/32 148/65 152/57 110/38  Pulse: 59 61 60 60  Temp: 98.4 F (36.9 C) 98.2 F (36.8 C) 97.9 F (36.6 C) 98.2 F (36.8 C)  TempSrc: Oral Oral Oral Oral  Resp: 20 20 20 20   Height:      Weight:  SpO2: 98% 96% 93% 96%   Gen: NAD  Right ear: Cerumen occludes ability to see TM  Cardiovascular: RRR, No M/R/G  Respiratory: Lungs CTAB  Gastrointestinal: Abdomen soft, NT/ND, + BS  Extremities: No C/E/C  Discharge Instructions       Discharge Orders   Future Appointments Provider Department Dept Phone   09/25/2013 11:00 AM [facility] [place] Office [phone]   10/02/2013 1:30 PM Donato Schultz, MD [facility] [phone]   12/19/2013 11:15 AM Marinus Maw, MD [facility] Office [phone]   Future Orders Complete By Expires   (HEART FAILURE PATIENTS) Call MD:  Anytime you have any of the following symptoms: 1) 3 pound weight gain in 24 hours or 5 pounds in 1 week 2) shortness of breath, with or without a dry hacking cough 3) swelling in the hands, feet or stomach 4) if you have to sleep on extra pillows at night in order to breathe.  As directed    Diet - low sodium heart healthy  As directed    Diet Carb Modified  As directed    Discharge instructions  As directed    Comments:     You were cared for by Dr. Hillery Aldo  (a hospitalist) during your hospital stay. If you have any questions about your discharge medications or the care you received while you were in the hospital after you are discharged, you can call the unit and ask to speak with the hospitalist on call if the hospitalist that took care of you is not available. Once you are discharged, your primary care physician will handle any further medical issues. Please note that NO REFILLS for any discharge medications will be authorized once you are discharged, as it is imperative that you return to your primary care physician (or establish a relationship with a primary care physician if you do not have one) for your aftercare needs so that they can reassess your need for medications and monitor your lab values.  Any outstanding tests can be reviewed by your PCP at your follow up visit.  It is also important to review any medicine changes with your PCP.  Please bring these d/c instructions with you to your next visit so your physician can review these changes with you.  If you do not have a primary care physician,  you can call [phone] for a physician referral.  It is highly recommended that you obtain a PCP for hospital follow up.   Increase activity slowly  As directed    Walk with assistance  As directed    Walker   As directed        Medication List    STOP taking these medications       aspirin EC 81 MG tablet     diclofenac sodium 1 % Gel  Commonly known as:  VOLTAREN     metoprolol 50 MG tablet  Commonly known as:  LOPRESSOR     potassium chloride SA 20 MEQ tablet  Commonly known as:  K-DUR,KLOR-CON     Rivaroxaban 15 MG Tabs tablet  Commonly known as:  XARELTO      TAKE these medications       acetaminophen 325 MG tablet  Commonly known as:  TYLENOL  Take 162.5 mg by mouth every 6 (six) hours as needed for mild pain. For pain     albuterol 108 (90 BASE) MCG/ACT inhaler  Commonly known as:  PROVENTIL  HFA;VENTOLIN HFA  Inhale 2 puffs into the lungs every 6 (six) hours as needed for wheezing.     amiodarone 100 MG tablet  Commonly known as:  PACERONE  Take 1 tablet (100 mg total) by mouth 2 (two) times daily.     apixaban 2.5 MG Tabs tablet  Commonly known as:  ELIQUIS  Take 1 tablet (2.5 mg total) by mouth 2 (two) times daily.     carbamide peroxide 6.5 % otic solution  Commonly known as:  DEBROX  Place 5 drops into the right ear 2 (two) times daily.     docusate sodium 100 MG capsule  Commonly known as:  COLACE  Take 1 capsule (100 mg total) by mouth 2 (two) times daily as needed. For constipation     donepezil 10 MG tablet  Commonly known as:  ARICEPT  Take 1 tablet (10 mg total) by mouth at bedtime.     ferrous sulfate 325 (65 FE) MG tablet  Take 1 tablet (325 mg total) by mouth 2 (two) times daily.     fish oil-omega-3 fatty acids 1000 MG capsule  Take 1 g by mouth daily.     furosemide 80 MG tablet  Commonly known as:  LASIX  Take 40-80 mg by mouth 2 (two) times daily. Takes 80 mg in the morning and 40 mg in the evening     gabapentin 600 MG tablet   Commonly known as:  NEURONTIN  Take 300-600 mg by mouth 2 (two) times daily. Take 1/2 tab in the morning and 1 tab at bedtime     hydrocortisone 25 MG suppository  Commonly known as:  ANUSOL-HC  Place 1 suppository (25 mg total) rectally 2 (two) times daily as needed for hemorrhoids or itching.     insulin aspart 100 UNIT/ML injection  Commonly known as:  novoLOG  Inject 0-5 Units into the skin at bedtime.     insulin aspart 100 UNIT/ML injection  Commonly known as:  novoLOG  Inject 0-9 Units into the skin 3 (three) times daily with meals.     insulin aspart 100 UNIT/ML injection  Commonly known as:  novoLOG  Inject 3 Units into the skin 3 (three) times daily with meals.     insulin glargine 100 UNIT/ML injection  Commonly known as:  LANTUS  Inject 24 Units into the skin every evening.     lactobacillus acidophilus Tabs tablet  Take 1 tablet by mouth daily.     multivitamin tablet  Take 1 tablet by mouth daily.     PARoxetine 40 MG tablet  Commonly known as:  PAXIL  Take 1 tablet (40 mg total) by mouth every morning.     polyethylene glycol packet  Commonly known as:  MIRALAX / GLYCOLAX  Take 17 g by mouth daily as needed.     PRESERVISION AREDS PO  Take 1 capsule by mouth daily.     Senna 15 MG Tabs  Take 1 tablet by mouth 2 (two) times daily. For constipation     simvastatin 10 MG tablet  Commonly known as:  ZOCOR  Take 1 tablet (10 mg total) by mouth at bedtime.     spironolactone 12.5 mg Tabs tablet  Commonly known as:  ALDACTONE  Take 0.5 tablets (12.5 mg total) by mouth 2 (two) times daily.       Follow-up Information   Follow up with [facility] On 09/25/2013. (At 11:00 AM for wound check)  Specialty:  Cardiology   Contact information:   [address] [place] [address] [phone]      Follow up with Donato Schultz, MD On 10/02/2013. (At 1:30 PM)    Specialty:  Cardiology   Contact information:   [address] [place] [address] [phone]       Follow up with Lewayne Bunting, MD On 12/19/2013. (At 11:15 AM)    Specialty:  Cardiology   Contact information:   [address] [place] [address] [phone]       Follow up with [name], MD. Schedule an appointment as soon as possible for a visit in 2 weeks. [facility] follow up.)    Specialty:  Internal Medicine   Contact information:   [address] [place] [address] [place] [address] [phone]        The results of significant diagnostics from this hospitalization (including imaging, microbiology, ancillary and laboratory) are listed below for reference.    Significant Diagnostic Studies: Dg Chest 2 View  09/15/2013   CLINICAL DATA:  Post permanent pacemaker placement.  EXAM: CHEST  2 VIEW  COMPARISON:  Chest x-ray of September 10, 2013  FINDINGS: The current study is taken in a somewhat lordotic matter. The lungs are reasonably well inflated. The interstitial markings are increased bilaterally that but this is not entirely new. The cardiopericardial silhouette is enlarged in the left heart border is indistinct. There is no evidence of a pneumothorax nor definite evidence of a pleural effusion. The permanent pacemaker appears to be in reasonable radiographic position. A prosthetic aortic valve is demonstrated. The patient has undergone previous CABG.  IMPRESSION: 1. There is no evidence of an immediate postprocedure complication following placement of a permanent pacemaker. 2. The findings are consistent with congestive heart failure and pulmonary interstitial edema. This is likely both acute and chronic.   Electronically Signed   By: David  [place]   On: 09/15/2013 08:21   Dg Chest 2 View  09/10/2013   CLINICAL DATA:  Coronary artery disease.  Congestive heart failure.  EXAM: CHEST  2 VIEW  COMPARISON:  07/23/2013.  FINDINGS: Cardiomegaly. Prior CABG. Bilateral airspace opacity likely  congestive heart failure although bilateral pneumonia not excluded. Significant worsening aeration from priors. Calcified tortuous aorta.  IMPRESSION: Cardiomegaly with suspected congestive heart failure.   Electronically Signed   By: Davonna Belling M.D.   On: 09/10/2013 14:19    Labs:  Basic Metabolic Panel:  Recent Labs Lab 09/11/13 0510 09/12/13 0338 09/13/13 0348 09/14/13 0400 09/15/13 0410  NA 141 137 138 141 141  K 3.1* 4.3 5.2* 4.3 3.7  CL 103 102 105 107 101  CO2 27 25 26 25  32  GLUCOSE 81 186* 200* 89 127*  BUN 41* 34* 35* 30* 25*  CREATININE 1.48* 1.37* 1.34* 1.13* 1.07  CALCIUM 8.8 8.7 8.6 8.6 8.4   GFR Estimated Creatinine Clearance: 50.6 ml/min (by C-G formula based on Cr of 1.07).  CBC:  Recent Labs Lab 09/11/13 0510 09/11/13 1055 09/11/13 2238 09/12/13 0338 09/13/13 0348 09/15/13 0410  WBC 8.0  --  6.2 6.8 6.8 5.6  HGB 10.9* 10.3* 10.8* 11.1* 11.0* 10.9*  HCT 33.1* 30.8* 33.5* 33.8* 33.6* 33.6*  MCV 96.8  --  97.7 96.3 98.2 96.8  PLT 130*  --  129* 126* 122* 87*   CBG:  Recent Labs Lab 09/16/13 1111 09/16/13 1634 09/16/13 2132 09/17/13 0656 09/17/13 1121  GLUCAP 151* 105*  147* 135* 142*   Microbiology Recent Results (from the past 240 hour(s))  MRSA PCR SCREENING     Status: None   Collection Time    09/10/13  6:20 PM      Result Value Range Status   MRSA by PCR NEGATIVE  NEGATIVE Final   Comment:            The GeneXpert MRSA Assay (FDA     approved for NASAL specimens     only), is one component of a     comprehensive MRSA colonization     surveillance program. It is not     intended to diagnose MRSA     infection nor to guide or     monitor treatment for     MRSA infections.    Time coordinating discharge: 35 minutes.  Signed:  Shakora Nordquist  Pager [phone] [facility] 09/17/2013, 2:13 PM

## 2013-09-25 ENCOUNTER — Ambulatory Visit: Payer: Medicare Other

## 2013-09-26 ENCOUNTER — Ambulatory Visit (INDEPENDENT_AMBULATORY_CARE_PROVIDER_SITE_OTHER): Payer: Medicare Other | Admitting: *Deleted

## 2013-09-26 ENCOUNTER — Encounter: Payer: Self-pay | Admitting: Internal Medicine

## 2013-09-26 DIAGNOSIS — R001 Bradycardia, unspecified: Secondary | ICD-10-CM

## 2013-09-26 DIAGNOSIS — Z950 Presence of cardiac pacemaker: Secondary | ICD-10-CM

## 2013-09-26 DIAGNOSIS — I498 Other specified cardiac arrhythmias: Secondary | ICD-10-CM

## 2013-09-26 LAB — MDC_IDC_ENUM_SESS_TYPE_INCLINIC
Battery Impedance: 100 Ohm
Battery Remaining Longevity: 149 mo
Battery Voltage: 2.8 V
Brady Statistic AP VP Percent: 1 %
Brady Statistic AP VS Percent: 44 %
Brady Statistic AS VP Percent: 0 %
Brady Statistic AS VS Percent: 55 %
Lead Channel Impedance Value: 592 Ohm
Lead Channel Pacing Threshold Amplitude: 0.5 V
Lead Channel Pacing Threshold Amplitude: 0.75 V
Lead Channel Pacing Threshold Pulse Width: 0.4 ms
Lead Channel Sensing Intrinsic Amplitude: 8 mV
Lead Channel Setting Pacing Amplitude: 3.5 V
Lead Channel Setting Sensing Sensitivity: 4 mV

## 2013-09-26 NOTE — Progress Notes (Signed)
Pt seen in device clinic for follow up of recently implanted pacemaker.  Wound well healed.  No redness, swelling, or edema.  Steri-strips removed today.   Device interrogated and found to be functioning normally.  No changes made today. See PaceArt for full details.  Pt denies chest pain, shortness of breath, palpitations, or dizziness.  Pt to follow up with Dr. Johney Frame in 3 months.   [name] 09/26/2013 4:07 PM

## 2013-10-02 ENCOUNTER — Encounter: Payer: Self-pay | Admitting: Cardiology

## 2013-10-02 ENCOUNTER — Ambulatory Visit (INDEPENDENT_AMBULATORY_CARE_PROVIDER_SITE_OTHER): Payer: Medicare Other | Admitting: Cardiology

## 2013-10-02 VITALS — BP 124/60 | HR 62 | Ht 63.0 in | Wt 219.0 lb

## 2013-10-02 DIAGNOSIS — I5032 Chronic diastolic (congestive) heart failure: Secondary | ICD-10-CM

## 2013-10-02 DIAGNOSIS — I2510 Atherosclerotic heart disease of native coronary artery without angina pectoris: Secondary | ICD-10-CM

## 2013-10-02 DIAGNOSIS — E785 Hyperlipidemia, unspecified: Secondary | ICD-10-CM

## 2013-10-02 DIAGNOSIS — I4891 Unspecified atrial fibrillation: Secondary | ICD-10-CM

## 2013-10-02 LAB — BASIC METABOLIC PANEL
BUN: 19 mg/dL (ref 6–23)
CHLORIDE: 97 meq/L (ref 96–112)
CO2: 27 mEq/L (ref 19–32)
Calcium: 9.3 mg/dL (ref 8.4–10.5)
Creatinine, Ser: 1.5 mg/dL — ABNORMAL HIGH (ref 0.4–1.2)
GFR: 36.21 mL/min — ABNORMAL LOW (ref >60.00)
Glucose, Bld: 304 mg/dL — ABNORMAL HIGH (ref 70–99)
POTASSIUM: 4.6 meq/L (ref 3.5–5.1)
Sodium: 133 mEq/L — ABNORMAL LOW (ref 135–145)

## 2013-10-02 LAB — CBC WITH DIFFERENTIAL/PLATELET
BASOS PCT: 0.3 % (ref 0.0–3.0)
Basophils Absolute: 0 10*3/uL (ref 0.0–0.1)
EOS PCT: 2.6 % (ref 0.0–5.0)
Eosinophils Absolute: 0.2 10*3/uL (ref 0.0–0.7)
HEMATOCRIT: 40.2 % (ref 36.0–46.0)
Hemoglobin: 13.6 g/dL (ref 12.0–15.0)
LYMPHS ABS: 1.3 10*3/uL (ref 0.7–4.0)
Lymphocytes Relative: 21.2 % (ref 12.0–46.0)
MCHC: 33.8 g/dL (ref 30.0–36.0)
MCV: 91.2 fl (ref 78.0–100.0)
MONO ABS: 0.7 10*3/uL (ref 0.1–1.0)
Monocytes Relative: 12.1 % — ABNORMAL HIGH (ref 3.0–12.0)
Neutro Abs: 3.9 10*3/uL (ref 1.4–7.7)
Neutrophils Relative %: 63.8 % (ref 43.0–77.0)
PLATELETS: 147 10*3/uL — AB (ref 150.0–400.0)
RBC: 4.4 Mil/uL (ref 3.87–5.11)
RDW: 14.3 % (ref 11.5–14.6)
WBC: 6.1 10*3/uL (ref 4.5–10.5)

## 2013-10-02 MED ORDER — APIXABAN 2.5 MG PO TABS: 2.5000 mg | ORAL_TABLET | Freq: Two times a day (BID) | ORAL | Status: DC

## 2013-10-02 NOTE — Progress Notes (Signed)
[facility]. [address], [place]  [address] Phone: [phone] Fax:  [phone]  Date:  10/02/2013   ID:  [name], DOB 1932-12-25, MRN [id number]  PCP:  Myrtis Ser, MD   History of Present Illness: [name] is a 80 y.o. female with chronic diastolic heart failure with multiple recurrent hospitalizations, normal EF, bypass with aortic valve replacement in 2009, morbid obesity, type 2 diabetes and bradycardia with pacemaker as well as paroxysmal atrial fibrillation here for followup.  06/15/13-she was taking too metolazone since previous visit. Her weight was up. She was worried about her dog with tooth abscess. [name], her daughter states that she constantly preaches to her about her fluid intake and salt intake. No knee replacement do to poor surgical candidate.  07/19/13 - Ortho with injection in knee. No SHOB. No CP. Knee a little improved. She takes metolazone extra approximately once per week. She still admits to dietary indiscretions. Her weight has been stable over the last month. No shortness of breath, no edema in lower extremities. She is weak. No chest pain.  08/20/13- unfortunately, she was back in the hospital. On 07/23/13. Her primary diagnosis was acute respiratory failure with hypoxia.-Was that this was most likely secondary to COPD exacerbation and pneumonia. Her diastolic heart failure appeared to be clinically compensated. She's had some issues with hyponatremia. She states that she did not get blood work previously by primary physician.  10/02/13-unfortunately she was hospitalized once again with primary diagnosis of GI bleed, probable hemorrhoidal bleeding. Her hemoglobin was stable. Pacemaker was placed due to severe bradycardia. Chronic diastolic heart failure was well controlled. Because of atrial fibrillation, Eliquis 2.5 mg twice a day was started. She used to be on Xarelto but this was changed to Eliquis for thoughts that this may help  reduce her overall GI bleeding risk. Her coronary artery disease status post bypass with stable.   Wt Readings from Last 3 Encounters:  10/02/13 219 lb (99.338 kg)  09/15/13 234 lb 8 oz (106.369 kg)  09/15/13 234 lb 8 oz (106.369 kg)     Past Medical History  Diagnosis Date  . Coronary artery disease 09/04/08    single vessel CABG at time of AVR   . Aortic stenosis 09/04/08    s/p AVR by Dr Servando Snare (tissue valve)  . Paroxysmal atrial fibrillation   . LBBB (left bundle branch block)   . CHF (congestive heart failure)     diastolic  . Sleep apnea   . Alzheimer's dementia   . Diabetes mellitus     type 2  . Hypertension   . Chronic anemia   . Osteoporosis   . Chronic renal failure   . Morbid obesity with BMI of 50.0-59.9, adult 03/09/2012  . Peripheral vascular disease   . Arthritis   . Chronic diastolic heart failure 51/88/4166  . Chronic right ear pain     "nerve damage"    Past Surgical History  Procedure Laterality Date  . Coronary artery bypass graft  09/04/08    single vessel at time of AVR  . Aortic valve replacement  12/09    tissue valve by Dr Servando Snare  . Cholecystectomy  1995  . Cataract extraction    . Replacement total knee bilateral      L 2000, R 1997  . Appendectomy    . Closed reduction femoral shaft fracture  2007    R intramedullary rod  . Tee without cardioversion  N/A 02/07/2013    Procedure: TRANSESOPHAGEAL ECHOCARDIOGRAM (TEE);  Surgeon: Candee Furbish, MD;  Location: [facility] ENDOSCOPY;  Service: Cardiovascular;  Laterality: N/A;    Current Outpatient Prescriptions  Medication Sig Dispense Refill  . acetaminophen (TYLENOL) 325 MG tablet Take 162.5 mg by mouth every 6 (six) hours as needed for mild pain. For pain      . albuterol (PROVENTIL HFA;VENTOLIN HFA) 108 (90 BASE) MCG/ACT inhaler Inhale 2 puffs into the lungs every 6 (six) hours as needed for wheezing.  1 Inhaler  2  . amiodarone (PACERONE) 100 MG tablet Take 1 tablet (100 mg total) by mouth 2  (two) times daily.  60 tablet  12  . apixaban (ELIQUIS) 2.5 MG TABS tablet Take 1 tablet (2.5 mg total) by mouth 2 (two) times daily.  60 tablet    . carbamide peroxide (DEBROX) 6.5 % otic solution Place 5 drops into the right ear 2 (two) times daily.  15 mL  0  . docusate sodium (COLACE) 100 MG capsule Take 1 capsule (100 mg total) by mouth 2 (two) times daily as needed. For constipation  30 capsule  0  . donepezil (ARICEPT) 10 MG tablet Take 1 tablet (10 mg total) by mouth at bedtime.  30 tablet  0  . ferrous sulfate 325 (65 FE) MG tablet Take 1 tablet (325 mg total) by mouth 2 (two) times daily.  60 tablet  0  . fish oil-omega-3 fatty acids 1000 MG capsule Take 1 g by mouth daily.      . furosemide (LASIX) 80 MG tablet Take 40-80 mg by mouth 2 (two) times daily. Takes 80 mg in the morning and 40 mg in the evening      . gabapentin (NEURONTIN) 600 MG tablet Take 300-600 mg by mouth 2 (two) times daily. Take 1/2 tab in the morning and 1 tab at bedtime      . hydrocortisone (ANUSOL-HC) 25 MG suppository Place 1 suppository (25 mg total) rectally 2 (two) times daily as needed for hemorrhoids or itching.  12 suppository  0  . insulin aspart (NOVOLOG) 100 UNIT/ML injection Inject 0-5 Units into the skin at bedtime.  1 vial  12  . insulin aspart (NOVOLOG) 100 UNIT/ML injection Inject 0-9 Units into the skin 3 (three) times daily with meals.  1 vial  12  . insulin aspart (NOVOLOG) 100 UNIT/ML injection Inject 3 Units into the skin 3 (three) times daily with meals.  1 vial  12  . insulin glargine (LANTUS) 100 UNIT/ML injection Inject 24 Units into the skin every evening.      . lactobacillus acidophilus (BACID) TABS tablet Take 1 tablet by mouth daily.       . Multiple Vitamin (MULTIVITAMIN) tablet Take 1 tablet by mouth daily.       . Multiple Vitamins-Minerals (PRESERVISION AREDS PO) Take 1 capsule by mouth daily.      Marland Kitchen PARoxetine (PAXIL) 40 MG tablet Take 1 tablet (40 mg total) by mouth every morning.   30 tablet  1  . polyethylene glycol (MIRALAX / GLYCOLAX) packet Take 17 g by mouth daily as needed.  14 each  0  . Sennosides (SENNA) 15 MG TABS Take 1 tablet by mouth 2 (two) times daily. For constipation      . simvastatin (ZOCOR) 10 MG tablet Take 1 tablet (10 mg total) by mouth at bedtime.  30 tablet  2  . spironolactone (ALDACTONE) 12.5 mg TABS tablet Take 0.5 tablets (12.5 mg total)  by mouth 2 (two) times daily.  60 tablet  6   No current facility-administered medications for this visit.    Allergies:    Allergies  Allergen Reactions  . Morphine And Related Other (See Comments)    "went crazy"-Hallucinations  . Celecoxib Other (See Comments)    Sore mouth  . Codeine   . Oxycodone   . Wellbutrin [Bupropion] Other (See Comments)    Hallucinations  . Zyrtec [Cetirizine] Rash    Social History:  The patient  reports that she has never smoked. She has never used smokeless tobacco. She reports that she does not drink alcohol or use illicit drugs.   ROS:  Please see the history of present illness.   Positive for lateral lower extremity discomfort. Bilateral. No syncope, no fevers, no bleeding, no orthopnea   All other systems reviewed and negative.   PHYSICAL EXAM: VS:  Ht 5\' 3"  (1.6 m)  Wt 219 lb (99.338 kg)  BMI 38.80 kg/m2 Well nourished, well developed, in no acute distressIn wheelchair HEENT: normal Neck: no JVD Cardiac:  normal S1, S2; RRR; no murmur no obvious JVD Lungs:  clear to auscultation bilaterally, no wheezing, rhonchi or rales Abd: soft, nontender, no hepatomegalyobese Ext: trace+ bilateral extremity edemaMild discoloration and lateral aspects of legs bilaterally. Chronic venous insufficiency changes. Skin: warm and dry Neuro: no focal abnormalities noted  EKG:  Atrial fibrillation, left bundle branch block, 72    ASSESSMENT AND PLAN:  1. Atrial fibrillation-currently on anticoagulation which was changed to low-dose Eliquis because of hemorrhoidal  bleeding.  Well  controlled. Last EKG demonstrated sinus/atrial tachycardia. Continue with low-dose amiodarone for maintenance. Unfortunately had severe bradycardia, symptomatic in the hospital setting and a pacemaker was placed in December of 2014.  2. Chronic diastolic heart failure-he is currently stable. I'm fine with her using metolazone on a once weekly basis. However, she does need to take her potassium supplementation as her hypokalemia was noted during hospitalization. She understands that if she overdoes this, this may result in worsening hypokalemia and renal failure. Checking basic metabolic profile today. It is been very challenging to control her over the years. Unfortunately she is had multiple readmissions. I would like for her to see advanced heart failure clinic to see if there any additional resources that can help her maintain out of hospital status. 3. Coronary artery disease-status post bypass, doing well, no exertional anginal symptoms. 4. Hypertension-currently well controlled. 5. Diabetes-per primary physician 6. Chronic venous insufficiency-try TED hose. 7. Morbid obesity-continue to encourage weight loss. 8. Pacemaker placement-severe bradycardia. Doing well. 9. I will see her back in one month.  Signed, Candee Furbish, MD [facility]  10/02/2013 1:36 PM

## 2013-10-02 NOTE — Patient Instructions (Signed)
Your physician recommends that you continue on your current medications as directed. Please refer to the Current Medication list given to you today.  Your physician recommends that you have labs today: BMET, CBC  Your physician recommends that you schedule a follow-up appointment in: 1 month with Dr. Marlou Porch

## 2013-10-03 ENCOUNTER — Other Ambulatory Visit: Payer: Self-pay

## 2013-10-03 MED ORDER — APIXABAN 2.5 MG PO TABS: 2.5000 mg | ORAL_TABLET | Freq: Two times a day (BID) | ORAL | Status: DC

## 2013-10-17 ENCOUNTER — Telehealth: Payer: Self-pay | Admitting: Internal Medicine

## 2013-10-17 NOTE — Telephone Encounter (Signed)
LMOM to answer questions/kwm

## 2013-10-17 NOTE — Telephone Encounter (Signed)
[name] asked if patient would be able to lift and do exercises now that her pacemaker has been in for a month. I told her that at this point she should be fine to do exercises, but I told her to wait another week before doing these activities. [name] voiced understanding and appreciated information given.

## 2013-10-17 NOTE — Telephone Encounter (Signed)
New message     Pt had pacemaker put in on 09-21-13.  Are there any precautions with lifting and moving her left arm

## 2013-11-05 ENCOUNTER — Ambulatory Visit (INDEPENDENT_AMBULATORY_CARE_PROVIDER_SITE_OTHER): Payer: Medicare Other | Admitting: Cardiology

## 2013-11-05 ENCOUNTER — Encounter: Payer: Self-pay | Admitting: Cardiology

## 2013-11-05 VITALS — BP 128/64 | HR 63 | Ht 63.0 in | Wt 231.0 lb

## 2013-11-05 DIAGNOSIS — Z954 Presence of other heart-valve replacement: Secondary | ICD-10-CM

## 2013-11-05 DIAGNOSIS — E119 Type 2 diabetes mellitus without complications: Secondary | ICD-10-CM

## 2013-11-05 DIAGNOSIS — I5032 Chronic diastolic (congestive) heart failure: Secondary | ICD-10-CM

## 2013-11-05 DIAGNOSIS — I2510 Atherosclerotic heart disease of native coronary artery without angina pectoris: Secondary | ICD-10-CM

## 2013-11-05 NOTE — Progress Notes (Signed)
[facility]. [address], [place]  [address] Phone: [phone] Fax:  [phone]  Date:  11/05/2013   ID:  [name], DOB 1933/06/05, MRN [id number]  PCP:  Myrtis Ser, MD   History of Present Illness: [name] is a 80 y.o. female with chronic diastolic heart failure with multiple recurrent hospitalizations, normal EF, bypass with aortic valve replacement in 2009, morbid obesity, type 2 diabetes and bradycardia with pacemaker as well as paroxysmal atrial fibrillation here for followup.  06/15/13-she was taking too metolazone since previous visit. Her weight was up. She was worried about her dog with tooth abscess. [name], her daughter states that she constantly preaches to her about her fluid intake and salt intake. No knee replacement do to poor surgical candidate.  07/19/13 - Ortho with injection in knee. No SHOB. No CP. Knee a little improved. She takes metolazone extra approximately once per week. She still admits to dietary indiscretions. Her weight has been stable over the last month. No shortness of breath, no edema in lower extremities. She is weak. No chest pain.  08/20/13- unfortunately, she was back in the hospital. On 07/23/13. Her primary diagnosis was acute respiratory failure with hypoxia.-Was that this was most likely secondary to COPD exacerbation and pneumonia. Her diastolic heart failure appeared to be clinically compensated. She's had some issues with hyponatremia. She states that she did not get blood work previously by primary physician.  10/02/13-unfortunately she was hospitalized once again with primary diagnosis of GI bleed, probable hemorrhoidal bleeding. Her hemoglobin was stable. Pacemaker was placed due to severe bradycardia. Chronic diastolic heart failure was well controlled. Because of atrial fibrillation, Eliquis 2.5 mg twice a day was started. She used to be on Xarelto but this was changed to Eliquis for thoughts that this may help  reduce her overall GI bleeding risk. Her coronary artery disease status post bypass with stable.  11/05/13-no hospitalization over the past month.   Wt Readings from Last 3 Encounters:  11/05/13 231 lb (104.781 kg)  10/02/13 219 lb (99.338 kg)  09/15/13 234 lb 8 oz (106.369 kg)     Past Medical History  Diagnosis Date  . Coronary artery disease 09/04/08    single vessel CABG at time of AVR   . Aortic stenosis 09/04/08    s/p AVR by Dr Servando Snare (tissue valve)  . Paroxysmal atrial fibrillation   . LBBB (left bundle branch block)   . CHF (congestive heart failure)     diastolic  . Sleep apnea   . Alzheimer's dementia   . Diabetes mellitus     type 2  . Hypertension   . Chronic anemia   . Osteoporosis   . Chronic renal failure   . Morbid obesity with BMI of 50.0-59.9, adult 03/09/2012  . Peripheral vascular disease   . Arthritis   . Chronic diastolic heart failure [id number]  . Chronic right ear pain     "nerve damage"    Past Surgical History  Procedure Laterality Date  . Coronary artery bypass graft  09/04/08    single vessel at time of AVR  . Aortic valve replacement  12/09    tissue valve by Dr Servando Snare  . Cholecystectomy  1995  . Cataract extraction    . Replacement total knee bilateral      L 2000, R 1997  . Appendectomy    . Closed reduction femoral shaft fracture  2007    R intramedullary rod  .  Tee without cardioversion N/A 02/07/2013    Procedure: TRANSESOPHAGEAL ECHOCARDIOGRAM (TEE);  Surgeon: Candee Furbish, MD;  Location: [facility] ENDOSCOPY;  Service: Cardiovascular;  Laterality: N/A;    Current Outpatient Prescriptions  Medication Sig Dispense Refill  . albuterol (PROVENTIL HFA;VENTOLIN HFA) 108 (90 BASE) MCG/ACT inhaler Inhale 2 puffs into the lungs every 6 (six) hours as needed for wheezing.  1 Inhaler  2  . amiodarone (PACERONE) 100 MG tablet Take 1 tablet (100 mg total) by mouth 2 (two) times daily.  60 tablet  12  . apixaban (ELIQUIS) 2.5 MG TABS tablet Take 1  tablet (2.5 mg total) by mouth 2 (two) times daily.  60 tablet  4  . Diclofenac Sodium (VOLTAREN PO) Take by mouth as needed.      . docusate sodium (COLACE) 100 MG capsule Take 1 capsule (100 mg total) by mouth 2 (two) times daily as needed. For constipation  30 capsule  0  . donepezil (ARICEPT) 10 MG tablet Take 1 tablet (10 mg total) by mouth at bedtime.  30 tablet  0  . ferrous sulfate 325 (65 FE) MG tablet Take 1 tablet (325 mg total) by mouth 2 (two) times daily.  60 tablet  0  . fish oil-omega-3 fatty acids 1000 MG capsule Take 1 g by mouth daily.      Marland Kitchen FLUTICASONE PROPIONATE  HFA IN Inhale 50 mcg into the lungs daily.      . furosemide (LASIX) 80 MG tablet Take 40-80 mg by mouth. Takes 80 mg in the morning and 40 mg in the evening      . gabapentin (NEURONTIN) 600 MG tablet Take 300-600 mg by mouth. Take 1/2 tab in the morning and 1 tab at bedtime      . insulin aspart (NOVOLOG) 100 UNIT/ML injection Inject 0-5 Units into the skin at bedtime.  1 vial  12  . insulin aspart (NOVOLOG) 100 UNIT/ML injection Inject 0-9 Units into the skin 3 (three) times daily with meals.  1 vial  12  . insulin glargine (LANTUS) 100 UNIT/ML injection Inject 24 Units into the skin every evening.      . Multiple Vitamin (MULTIVITAMIN) tablet Take 1 tablet by mouth daily.       Marland Kitchen PARoxetine (PAXIL) 40 MG tablet Take 1 tablet (40 mg total) by mouth every morning.  30 tablet  1  . potassium chloride SA (K-DUR,KLOR-CON) 20 MEQ tablet Take 20 mEq by mouth 3 (three) times daily.      . Sennosides (SENNA) 15 MG TABS Take 1 tablet by mouth as needed. For constipation      . simvastatin (ZOCOR) 10 MG tablet Take 1 tablet (10 mg total) by mouth at bedtime.  30 tablet  2  . spironolactone (ALDACTONE) 12.5 mg TABS tablet Take 0.5 tablets (12.5 mg total) by mouth 2 (two) times daily.  60 tablet  6  . sulfamethoxazole-trimethoprim (BACTRIM DS,SEPTRA DS) 800-160 MG per tablet Take 1 tablet by mouth 2 (two) times daily.      Marland Kitchen  acetaminophen (TYLENOL) 325 MG tablet Take 162.5 mg by mouth every 6 (six) hours as needed for mild pain. For pain      . carbamide peroxide (DEBROX) 6.5 % otic solution Place 5 drops into the right ear 2 (two) times daily.  15 mL  0  . hydrocortisone (ANUSOL-HC) 25 MG suppository Place 1 suppository (25 mg total) rectally 2 (two) times daily as needed for hemorrhoids or itching.  12 suppository  0  . insulin aspart (NOVOLOG) 100 UNIT/ML injection Inject 3 Units into the skin 3 (three) times daily with meals.  1 vial  12  . lactobacillus acidophilus (BACID) TABS tablet Take 1 tablet by mouth daily.       . Multiple Vitamins-Minerals (PRESERVISION AREDS PO) Take 1 capsule by mouth daily.      . polyethylene glycol (MIRALAX / GLYCOLAX) packet Take 17 g by mouth daily as needed.  14 each  0   No current facility-administered medications for this visit.    Allergies:    Allergies  Allergen Reactions  . Morphine And Related Other (See Comments)    "went crazy"-Hallucinations  . Celecoxib Other (See Comments)    Sore mouth  . Codeine   . Oxycodone   . Wellbutrin [Bupropion] Other (See Comments)    Hallucinations  . Zyrtec [Cetirizine] Rash    Social History:  The patient  reports that she has never smoked. She has never used smokeless tobacco. She reports that she does not drink alcohol or use illicit drugs.   ROS:  Please see the history of present illness.   Positive for lateral lower extremity discomfort. Bilateral. No syncope, no fevers, no bleeding, no orthopnea   All other systems reviewed and negative.   PHYSICAL EXAM: VS:  Ht 5\' 3"  (1.6 m)  Wt 231 lb (104.781 kg)  BMI 40.93 kg/m2 Well nourished, well developed, in no acute distressIn wheelchair HEENT: normal Neck: no JVD Cardiac:  normal S1, S2; RRR; no murmur no obvious JVD Lungs:  clear to auscultation bilaterally, no wheezing, rhonchi or rales Abd: soft, nontender, no hepatomegalyobese Ext: trace+ bilateral extremity  edemaMild discoloration and lateral aspects of legs bilaterally. Chronic venous insufficiency changes. Skin: warm and dry Neuro: no focal abnormalities noted  EKG:  Atrial fibrillation, left bundle branch block, 72    ASSESSMENT AND PLAN:  1. Atrial fibrillation-currently on anticoagulation which was changed to low-dose Eliquis because of hemorrhoidal bleeding.  Well controlled. Last EKG demonstrated sinus/atrial tachycardia. Continue with low-dose amiodarone for maintenance. Unfortunately had severe bradycardia, symptomatic in the hospital setting and a pacemaker was placed in December of 2014.  2. Chronic diastolic heart failure-she is currently stable. I'm fine with her using metolazone on a once weekly basis. However, she does need to take her potassium supplementation as her hypokalemia was noted during hospitalization. She understands that if she overdoes this, this may result in worsening hypokalemia and renal failure. Checking basic metabolic profile today. It is been very challenging to control her over the years. She continues to drink too much fluids at home. Unfortunately she is had multiple readmissions. I would like for her to see advanced heart failure clinic to see if there any additional resources that can help her maintain out of hospital status. 3. Coronary artery disease-status post bypass, doing well, no exertional anginal symptoms. 4. Hypertension-currently well controlled. 5. Diabetes-per primary physician 6. Chronic venous insufficiency-try TED hose. 7. Morbid obesity-continue to encourage weight loss. 8. Pacemaker placement-severe bradycardia. Doing well. 9. I will see her back in one month.  Signed, Candee Furbish, MD [facility]  11/05/2013 2:41 PM

## 2013-11-05 NOTE — Patient Instructions (Signed)
Your physician recommends that you continue on your current medications as directed. Please refer to the Current Medication list given to you today.  Your physician recommends that you schedule a follow-up appointment in:  1 month with Dr. Marlou Porch.

## 2013-12-03 ENCOUNTER — Encounter: Payer: Self-pay | Admitting: Cardiology

## 2013-12-03 ENCOUNTER — Ambulatory Visit (INDEPENDENT_AMBULATORY_CARE_PROVIDER_SITE_OTHER): Payer: Medicare Other | Admitting: Cardiology

## 2013-12-03 VITALS — BP 142/62 | HR 82 | Ht 63.0 in | Wt 234.0 lb

## 2013-12-03 DIAGNOSIS — I10 Essential (primary) hypertension: Secondary | ICD-10-CM

## 2013-12-03 DIAGNOSIS — I509 Heart failure, unspecified: Secondary | ICD-10-CM

## 2013-12-03 DIAGNOSIS — I5042 Chronic combined systolic (congestive) and diastolic (congestive) heart failure: Secondary | ICD-10-CM

## 2013-12-03 LAB — BASIC METABOLIC PANEL
BUN: 28 mg/dL — ABNORMAL HIGH (ref 6–23)
CALCIUM: 9.3 mg/dL (ref 8.4–10.5)
CO2: 25 mEq/L (ref 19–32)
Chloride: 99 mEq/L (ref 96–112)
Creatinine, Ser: 1.4 mg/dL — ABNORMAL HIGH (ref 0.4–1.2)
GFR: 38.27 mL/min — ABNORMAL LOW (ref >60.00)
GLUCOSE: 385 mg/dL — AB (ref 70–99)
Potassium: 4.6 mEq/L (ref 3.5–5.1)
Sodium: 135 mEq/L (ref 135–145)

## 2013-12-03 NOTE — Progress Notes (Signed)
[place]. [address], [address], [place]  [address] Phone: [phone] Fax:  [phone]  Date:  12/03/2013   ID:  [name], DOB 02-15-1933, MRN [id number]  PCP:  Stacy Ser, MD   History of Present Illness: [name] is a 80 y.o. female with chronic diastolic heart failure with multiple recurrent hospitalizations, normal EF, bypass with aortic valve replacement in 2009, morbid obesity, type 2 diabetes and bradycardia with pacemaker as well as paroxysmal atrial fibrillation here for followup.  06/15/13-she was taking too metolazone since previous visit. Her weight was up. She was worried about her dog with tooth abscess. [name], her daughter states that she constantly preaches to her about her fluid intake and salt intake. No knee replacement do to poor surgical candidate.  07/19/13 - Ortho with injection in knee. No SHOB. No CP. Knee a little improved. She takes metolazone extra approximately once per week. She still admits to dietary indiscretions. Her weight has been stable over the last month. No shortness of breath, no edema in lower extremities. She is weak. No chest pain.  08/20/13- unfortunately, she was back in the hospital. On 07/23/13. Her primary diagnosis was acute respiratory failure with hypoxia.-Was that this was most likely secondary to COPD exacerbation and pneumonia. Her diastolic heart failure appeared to be clinically compensated. She's had some issues with hyponatremia. She states that she did not get blood work previously by primary physician.  10/02/13-unfortunately she was hospitalized once again with primary diagnosis of GI bleed, probable hemorrhoidal bleeding. Her hemoglobin was stable. Pacemaker was placed due to severe bradycardia. Chronic diastolic heart failure was well controlled. Because of atrial fibrillation, Eliquis 2.5 mg twice a day was started. She used to be on Xarelto but this was changed to Eliquis for thoughts that this may help  reduce her overall GI bleeding risk. Her coronary artery disease status post bypass with stable.  11/05/13-no hospitalization over the past month.  12/03/13-currently her main complaint is leg pain. She sees [name]. Not operative candidate. She has increased her weight approximately 3 pounds. Some of this could be adipose tissue because of her limited mobility. She's not short of breath. No crackles.   Wt Readings from Last 3 Encounters:  12/03/13 234 lb (106.142 kg)  11/05/13 231 lb (104.781 kg)  10/02/13 219 lb (99.338 kg)     Past Medical History  Diagnosis Date  . Coronary artery disease 09/04/08    single vessel CABG at time of AVR   . Aortic stenosis 09/04/08    s/p AVR by Dr [name] (tissue valve)  . Paroxysmal atrial fibrillation   . LBBB (left bundle branch block)   . CHF (congestive heart failure)     diastolic  . Sleep apnea   . Alzheimer's dementia   . Diabetes mellitus     type 2  . Hypertension   . Chronic anemia   . Osteoporosis   . Chronic renal failure   . Morbid obesity with BMI of 50.0-59.9, adult 03/09/2012  . Peripheral vascular disease   . Arthritis   . Chronic diastolic heart failure [id number]  . Chronic right ear pain     "nerve damage"    Past Surgical History  Procedure Laterality Date  . Coronary artery bypass graft  09/04/08    single vessel at time of AVR  . Aortic valve replacement  12/09    tissue valve by Dr [name]  . Cholecystectomy  1995  .  Cataract extraction    . Replacement total knee bilateral      L 2000, R 1997  . Appendectomy    . Closed reduction femoral shaft fracture  2007    R intramedullary rod  . Tee without cardioversion N/A 02/07/2013    Procedure: TRANSESOPHAGEAL ECHOCARDIOGRAM (TEE);  Surgeon: Stacy Furbish, MD;  Location: [facility] ENDOSCOPY;  Service: Cardiovascular;  Laterality: N/A;    Current Outpatient Prescriptions  Medication Sig Dispense Refill  . acetaminophen (TYLENOL) 325 MG tablet Take 162.5 mg by mouth  every 6 (six) hours as needed for mild pain. For pain      . albuterol (PROVENTIL HFA;VENTOLIN HFA) 108 (90 BASE) MCG/ACT inhaler Inhale 2 puffs into the lungs every 6 (six) hours as needed for wheezing.  1 Inhaler  2  . amiodarone (PACERONE) 100 MG tablet Take 1 tablet (100 mg total) by mouth 2 (two) times daily.  60 tablet  12  . apixaban (ELIQUIS) 2.5 MG TABS tablet Take 1 tablet (2.5 mg total) by mouth 2 (two) times daily.  60 tablet  4  . carbamide peroxide (DEBROX) 6.5 % otic solution Place 5 drops into the right ear 2 (two) times daily.  15 mL  0  . Diclofenac Sodium (VOLTAREN PO) Take by mouth as needed.      . diclofenac sodium (VOLTAREN) 1 % GEL Apply topically 4 (four) times daily.      Marland Kitchen docusate sodium (COLACE) 100 MG capsule Take 1 capsule (100 mg total) by mouth 2 (two) times daily as needed. For constipation  30 capsule  0  . donepezil (ARICEPT) 10 MG tablet Take 1 tablet (10 mg total) by mouth at bedtime.  30 tablet  0  . ferrous sulfate 325 (65 FE) MG tablet Take 1 tablet (325 mg total) by mouth 2 (two) times daily.  60 tablet  0  . fish oil-omega-3 fatty acids 1000 MG capsule Take 1 g by mouth daily.      . furosemide (LASIX) 80 MG tablet Take 40-80 mg by mouth. Takes 80 mg in the morning and 40 mg in the evening      . gabapentin (NEURONTIN) 600 MG tablet Take 300-600 mg by mouth. Take 1/2 tab in the morning and 1 tab at bedtime      . hydrocortisone (ANUSOL-HC) 25 MG suppository Place 1 suppository (25 mg total) rectally 2 (two) times daily as needed for hemorrhoids or itching.  12 suppository  0  . insulin glargine (LANTUS) 100 UNIT/ML injection Inject 24 Units into the skin every evening.      . lactobacillus acidophilus (BACID) TABS tablet Take 1 tablet by mouth daily.       . Multiple Vitamin (MULTIVITAMIN) tablet Take 1 tablet by mouth daily.       . Multiple Vitamins-Minerals (PRESERVISION AREDS PO) Take 1 capsule by mouth daily.      Marland Kitchen PARoxetine (PAXIL) 40 MG tablet  Take 1 tablet (40 mg total) by mouth every morning.  30 tablet  1  . potassium chloride SA (K-DUR,KLOR-CON) 20 MEQ tablet Take 20 mEq by mouth 3 (three) times daily.      . Sennosides (SENNA) 15 MG TABS Take 1 tablet by mouth as needed. For constipation      . simvastatin (ZOCOR) 10 MG tablet Take 1 tablet (10 mg total) by mouth at bedtime.  30 tablet  2  . spironolactone (ALDACTONE) 12.5 mg TABS tablet Take 0.5 tablets (12.5 mg total) by mouth 2 (  two) times daily.  60 tablet  6  . sulfamethoxazole-trimethoprim (BACTRIM DS,SEPTRA DS) 800-160 MG per tablet Take 1 tablet by mouth 2 (two) times daily.       No current facility-administered medications for this visit.    Allergies:    Allergies  Allergen Reactions  . Morphine And Related Other (See Comments)    "went crazy"-Hallucinations  . Celecoxib Other (See Comments)    Sore mouth  . Codeine   . Oxycodone   . Wellbutrin [Bupropion] Other (See Comments)    Hallucinations  . Zyrtec [Cetirizine] Rash    Social History:  The patient  reports that she has never smoked. She has never used smokeless tobacco. She reports that she does not drink alcohol or use illicit drugs.   ROS:  Please see the history of present illness.   Positive for lateral lower extremity discomfort. Bilateral. No syncope, no fevers, no bleeding, no orthopnea   All other systems reviewed and negative.   PHYSICAL EXAM: VS:  BP 142/62  Pulse 82  Ht 5\' 3"  (1.6 m)  Wt 234 lb (106.142 kg)  BMI 41.46 kg/m2 Well nourished, well developed, in no acute distressIn wheelchair HEENT: normal Neck: no JVD Cardiac:  normal S1, S2; RRR; no murmur no obvious JVD Lungs:  clear to auscultation bilaterally, no wheezing, rhonchi or rales Abd: soft, nontender, no hepatomegalyobese Ext: trace+ bilateral extremity edemaMild discoloration and lateral aspects of legs bilaterally. Chronic venous insufficiency changes. Skin: warm and dry Neuro: no focal abnormalities noted  EKG:   Atrial fibrillation, left bundle branch block, 72    ASSESSMENT AND PLAN:  1. Atrial fibrillation-currently on anticoagulation which was changed to low-dose Eliquis because of hemorrhoidal bleeding.  Well controlled. Last EKG demonstrated sinus/atrial tachycardia. Continue with low-dose amiodarone for maintenance. Unfortunately had severe bradycardia, symptomatic in the hospital setting and a pacemaker was placed in December of 2014.  2. Chronic diastolic heart failure-she is currently stable. Weight has increased slightly. She is more sedentary however with her leg pain.  I'm fine with her using metolazone on a once weekly basis. However, she does need to take her potassium supplementation as her hypokalemia was noted during hospitalization. She understands that if she overdoes this, this may result in worsening hypokalemia and renal failure. Checking basic metabolic profile today. It is been very challenging to control her over the years. She continues to drink too much fluids at home. Unfortunately she is had multiple readmissions. 3. Coronary artery disease-status post bypass, doing well, no exertional anginal symptoms. 4. Hypertension-currently well controlled. 5. Diabetes-per primary physician 6. Chronic venous insufficiency-try TED hose. 7. Morbid obesity-continue to encourage weight loss. 8. Pacemaker placement-severe bradycardia. Doing well. 9. I will see her back in one month.  Signed, Stacy Furbish, MD [facility]  12/03/2013 2:31 PM

## 2013-12-03 NOTE — Patient Instructions (Signed)
Your physician recommends that you continue on your current medications as directed. Please refer to the Current Medication list given to you today.  Your physician recommends that you have labs today: BMET  Your physician recommends that you schedule a follow-up appointment in: 1 month with Dr. Marlou Porch.

## 2013-12-04 ENCOUNTER — Telehealth: Payer: Self-pay | Admitting: Cardiology

## 2013-12-04 NOTE — Telephone Encounter (Signed)
New message          Pt daughter returning nurses call

## 2013-12-05 ENCOUNTER — Other Ambulatory Visit: Payer: Self-pay | Admitting: Orthopaedic Surgery

## 2013-12-05 DIAGNOSIS — M545 Low back pain, unspecified: Secondary | ICD-10-CM

## 2013-12-06 ENCOUNTER — Ambulatory Visit
Admission: RE | Admit: 2013-12-06 | Discharge: 2013-12-06 | Disposition: A | Discharge status: Home / Self Care | Payer: Medicare Other | Source: Ambulatory Visit | Attending: Orthopaedic Surgery | Admitting: Orthopaedic Surgery

## 2013-12-06 DIAGNOSIS — M545 Low back pain, unspecified: Secondary | ICD-10-CM

## 2013-12-07 NOTE — Telephone Encounter (Signed)
Returned daughter's call and let her know Bmet results were given to her mother on 12/04/13.

## 2013-12-16 ENCOUNTER — Other Ambulatory Visit: Payer: Self-pay | Admitting: Cardiology

## 2013-12-17 ENCOUNTER — Other Ambulatory Visit: Payer: Self-pay | Admitting: Cardiology

## 2013-12-19 ENCOUNTER — Encounter: Payer: Medicare Other | Admitting: Internal Medicine

## 2013-12-24 ENCOUNTER — Telehealth: Payer: Self-pay | Admitting: Cardiology

## 2013-12-24 NOTE — Telephone Encounter (Signed)
Daughter is aware we do have the fax from [facility] regarding clearance for spinal injection. Dr. Marlou Porch is in the office tomorrow & will give fax to him.  Daughter would like a call back if her mom can have the injection & be off of Eliquis tomorrow if possible so the procedure can be scheduled Horton Chin RN

## 2013-12-24 NOTE — Telephone Encounter (Signed)
New problem    Pt's daughter need to know the status of medical clearance for pt having shot in her back. Need to know if Dr Marlou Porch approves of her having it done.

## 2013-12-25 ENCOUNTER — Encounter: Payer: Medicare Other | Admitting: Cardiology

## 2013-12-25 NOTE — Telephone Encounter (Signed)
Signed form. Please hold Eliquis for 2 days prior to procedure. No bridging.

## 2013-12-28 ENCOUNTER — Telehealth: Payer: Self-pay | Admitting: Cardiology

## 2013-12-28 ENCOUNTER — Telehealth: Payer: Self-pay | Admitting: Internal Medicine

## 2013-12-28 NOTE — Telephone Encounter (Signed)
New Message  Pt daughter called to follow up on cardiac clearance for cortisone injection for a bulging disk in her back.. Requests a call back//

## 2013-12-28 NOTE — Telephone Encounter (Signed)
Clearance Faxed

## 2013-12-28 NOTE — Telephone Encounter (Signed)
Spoke with patient's daughter advised that Clearance was faxed today. Cleared to hold Eliquis  2 days prior to injection.

## 2014-01-02 ENCOUNTER — Telehealth: Payer: Self-pay | Admitting: Cardiology

## 2014-01-02 NOTE — Telephone Encounter (Signed)
Received request from Nurse fax box, documents faxed for surgical clearance. To: [employer] number: [phone] Attention: [id number]/kdm

## 2014-01-14 ENCOUNTER — Other Ambulatory Visit: Payer: Self-pay | Admitting: Cardiology

## 2014-01-14 ENCOUNTER — Encounter: Payer: Self-pay | Admitting: Cardiology

## 2014-01-14 ENCOUNTER — Ambulatory Visit (INDEPENDENT_AMBULATORY_CARE_PROVIDER_SITE_OTHER): Payer: Medicare Other | Admitting: Cardiology

## 2014-01-14 VITALS — BP 132/70 | HR 68 | Ht 63.0 in | Wt 236.0 lb

## 2014-01-14 DIAGNOSIS — Z954 Presence of other heart-valve replacement: Secondary | ICD-10-CM

## 2014-01-14 DIAGNOSIS — J449 Chronic obstructive pulmonary disease, unspecified: Secondary | ICD-10-CM

## 2014-01-14 DIAGNOSIS — I5032 Chronic diastolic (congestive) heart failure: Secondary | ICD-10-CM

## 2014-01-14 DIAGNOSIS — E785 Hyperlipidemia, unspecified: Secondary | ICD-10-CM

## 2014-01-14 MED ORDER — METOLAZONE 5 MG PO TABS: 5.0000 mg | ORAL_TABLET | ORAL | Status: DC | PRN

## 2014-01-14 NOTE — Progress Notes (Signed)
[place]. [address], [address] [place], [place]  [address] Phone: [phone] Fax:  [phone]  Date:  01/14/2014   ID:  [name], DOB 02/17/1933, MRN [id number]  PCP:  Cloyd Stagers, MD   History of Present Illness: [name] is a 80 y.o. female with chronic diastolic heart failure with multiple recurrent hospitalizations, normal EF, bypass with aortic valve replacement in 2009, morbid obesity, type 2 diabetes and bradycardia with pacemaker as well as paroxysmal atrial fibrillation here for followup.  06/15/13-she was taking too metolazone since previous visit. Her weight was up. She was worried about her dog with tooth abscess. [name], her daughter states that she constantly preaches to her about her fluid intake and salt intake. No knee replacement do to poor surgical candidate.  07/19/13 - Ortho with injection in knee. No SHOB. No CP. Knee a little improved. She takes metolazone extra approximately once per week. She still admits to dietary indiscretions. Her weight has been stable over the last month. No shortness of breath, no edema in lower extremities. She is weak. No chest pain.  08/20/13- unfortunately, she was back in the hospital. On 07/23/13. Her primary diagnosis was acute respiratory failure with hypoxia.-Was that this was most likely secondary to COPD exacerbation and pneumonia. Her diastolic heart failure appeared to be clinically compensated. She's had some issues with hyponatremia. She states that she did not get blood work previously by primary physician.  10/02/13-unfortunately she was hospitalized once again with primary diagnosis of GI bleed, probable hemorrhoidal bleeding. Her hemoglobin was stable. Pacemaker was placed due to severe bradycardia. Chronic diastolic heart failure was well controlled. Because of atrial fibrillation, Eliquis 2.5 mg twice a day was started. She used to be on Xarelto but this was changed to Eliquis for thoughts that  this may help reduce her overall GI bleeding risk. Her coronary artery disease status post bypass with stable.  11/05/13-no hospitalization over the past month.  12/03/13-currently her main complaint is leg pain. She sees Dr. Donne Hazel. Not operative candidate. She has increased her weight approximately 3 pounds. Some of this could be adipose tissue because of her limited mobility. She's not short of breath. No crackles.  01/14/14-she saw Dr. Laurence Spates. Injection given. Overall doing well. Leg pain seems to be improved. She continues to drink excessive amounts of tea. Counseled her on this once again. We gave her metolazone 5 mg. Lasix 80/80 for 3 days. Metolazone for one day.   Wt Readings from Last 3 Encounters:  01/14/14 236 lb (107.049 kg)  12/03/13 234 lb (106.142 kg)  11/05/13 231 lb (104.781 kg)     Past Medical History  Diagnosis Date  . Coronary artery disease 09/04/08    single vessel CABG at time of AVR   . Aortic stenosis 09/04/08    s/p AVR by Dr Servando Snare (tissue valve)  . Paroxysmal atrial fibrillation     remote history of PAF  . LBBB (left bundle branch block)   . CHF (congestive heart failure)     diastolic  . OSA (obstructive sleep apnea)   . Alzheimer's dementia   . Diabetes mellitus     type 2  . Hypertension   . Chronic anemia   . Osteoporosis   . Chronic renal failure   . Morbid obesity with BMI of 50.0-59.9, adult 03/09/2012  . Peripheral vascular disease   . Arthritis   . Chronic diastolic heart failure 71/69/6789  . Chronic  right ear pain     "nerve damage"  . Atrial flutter   . Aortic valve disorders   . Heart valve replaced by other means   . Anemia in chronic kidney disease   . Major depressive disorder   . Alopecia   . Benign hypertensive kidney disease with chronic kidney disease stage I through stage IV, or unspecified   . Type II or unspecified type diabetes mellitus with neurological manifestations, not stated as uncontrolled   . Pure  hypercholesterolemia   . Coronary atherosclerosis of native coronary artery   . Obesity   . GI bleed 08/2013    Past Surgical History  Procedure Laterality Date  . Coronary artery bypass graft  09/04/08    single vessel at time of AVR  . Aortic valve replacement  12/09    tissue valve by Dr Servando Snare  . Cholecystectomy  1999  . Cataract extraction  2002  . Replacement total knee bilateral      L 2000, R 1997  . Appendectomy    . Closed reduction femoral shaft fracture  2007    R intramedullary rod  . Tee without cardioversion N/A 02/07/2013    Procedure: TRANSESOPHAGEAL ECHOCARDIOGRAM (TEE);  Surgeon: Candee Furbish, MD;  Location: [facility] ENDOSCOPY;  Service: Cardiovascular;  Laterality: N/A;  . Pacemaker insertion  08/2013    Current Outpatient Prescriptions  Medication Sig Dispense Refill  . acetaminophen (TYLENOL) 325 MG tablet Take 162.5 mg by mouth every 6 (six) hours as needed for mild pain. For pain      . albuterol (PROVENTIL HFA;VENTOLIN HFA) 108 (90 BASE) MCG/ACT inhaler Inhale 2 puffs into the lungs every 6 (six) hours as needed for wheezing.  1 Inhaler  2  . amiodarone (PACERONE) 100 MG tablet Take 1 tablet (100 mg total) by mouth 2 (two) times daily.  60 tablet  12  . apixaban (ELIQUIS) 2.5 MG TABS tablet Take 1 tablet (2.5 mg total) by mouth 2 (two) times daily.  60 tablet  4  . carbamide peroxide (DEBROX) 6.5 % otic solution Place 5 drops into the right ear 2 (two) times daily.  15 mL  0  . diclofenac sodium (VOLTAREN) 1 % GEL Apply topically 4 (four) times daily.      Marland Kitchen docusate sodium (COLACE) 100 MG capsule Take 1 capsule (100 mg total) by mouth 2 (two) times daily as needed. For constipation  30 capsule  0  . donepezil (ARICEPT) 10 MG tablet Take 1 tablet (10 mg total) by mouth at bedtime.  30 tablet  0  . ferrous sulfate 325 (65 FE) MG tablet Take 1 tablet (325 mg total) by mouth 2 (two) times daily.  60 tablet  0  . fish oil-omega-3 fatty acids 1000 MG capsule Take 1 g  by mouth daily.      . furosemide (LASIX) 80 MG tablet Take 40-80 mg by mouth. Takes 80 mg in the morning and 40 mg in the evening      . gabapentin (NEURONTIN) 600 MG tablet Take 300-600 mg by mouth. Take 1/2 tab in the morning and 1 tab at bedtime      . hydrocortisone (ANUSOL-HC) 25 MG suppository Place 1 suppository (25 mg total) rectally 2 (two) times daily as needed for hemorrhoids or itching.  12 suppository  0  . insulin glargine (LANTUS) 100 UNIT/ML injection Inject 24 Units into the skin every evening.      . lactobacillus acidophilus (BACID) TABS tablet Take 1 tablet  by mouth daily.       . Multiple Vitamin (MULTIVITAMIN) tablet Take 1 tablet by mouth daily.       . Multiple Vitamins-Minerals (PRESERVISION AREDS PO) Take 1 capsule by mouth daily.      Marland Kitchen PARoxetine (PAXIL) 40 MG tablet Take 1 tablet (40 mg total) by mouth every morning.  30 tablet  1  . potassium chloride SA (K-DUR,KLOR-CON) 20 MEQ tablet Take 20 mEq by mouth 3 (three) times daily.      . Sennosides (SENNA) 15 MG TABS Take 1 tablet by mouth as needed. For constipation      . simvastatin (ZOCOR) 10 MG tablet TAKE 1 TABLET BY MOUTH AT BEDTIME  90 tablet  0  . spironolactone (ALDACTONE) 12.5 mg TABS tablet Take 0.5 tablets (12.5 mg total) by mouth 2 (two) times daily.  60 tablet  6   No current facility-administered medications for this visit.    Allergies:    Allergies  Allergen Reactions  . Morphine And Related Other (See Comments)    "went crazy"-Hallucinations  . Celebrex [Celecoxib] Other (See Comments)    Sore mouth  . Codeine   . Oxycodone   . Sulfa Antibiotics Itching    Itching all over body  . Wellbutrin [Bupropion] Other (See Comments)    Hallucinations  . Zyrtec [Cetirizine] Rash    Social History:  The patient  reports that she has never smoked. She has never used smokeless tobacco. She reports that she does not drink alcohol or use illicit drugs.   ROS:  Please see the history of present  illness.   Positive for lateral lower extremity discomfort. Bilateral. No syncope, no fevers, no bleeding, no orthopnea   All other systems reviewed and negative.   PHYSICAL EXAM: VS:  BP 132/70  Pulse 68  Ht 5\' 3"  (1.6 m)  Wt 236 lb (107.049 kg)  BMI 41.82 kg/m2 Well nourished, well developed, in no acute distressIn wheelchair HEENT: normal Neck: no JVD Cardiac:  normal S1, S2; RRR; no murmur no obvious JVD Lungs:  clear to auscultation bilaterally, no wheezing, rhonchi or rales Abd: soft, nontender, no hepatomegalyobese Ext: trace+ bilateral extremity edemaMild discoloration and lateral aspects of legs bilaterally. Chronic venous insufficiency changes. Skin: warm and dry Neuro: no focal abnormalities noted  EKG:  Atrial fibrillation, left bundle branch block, 72    ASSESSMENT AND PLAN:  1. Atrial fibrillation-currently on anticoagulation which was changed to low-dose Eliquis because of hemorrhoidal bleeding.  Well controlled. Last EKG demonstrated sinus/atrial tachycardia. Continue with low-dose amiodarone for maintenance. Unfortunately had severe bradycardia, symptomatic in the hospital setting and a pacemaker was placed in December of 2014.  2. Chronic diastolic heart failure-she is currently stable. Weight has increased slightly. She is more sedentary however with her leg pain. Getting metolazone 5 mg times one tomorrow and increasing Lasix to 80/80 for 3 days. However, she does need to take her potassium supplementation as her hypokalemia was noted during hospitalization. She understands that if she overdoes this, this may result in worsening hypokalemia and renal failure. Recent basic metabolic profile creat 1.4 K 4.6. It is been very challenging to control her over the years. She continues to drink too much fluids at home. Unfortunately she is had multiple readmissions. 3. Coronary artery disease-status post bypass, doing well, no exertional anginal  symptoms. 4. Hypertension-currently well controlled. 5. Diabetes-per primary physician 6. Chronic venous insufficiency-try TED hose. 7. Morbid obesity-continue to encourage weight loss. 8. Pacemaker placement-severe bradycardia. Doing  well. 9. I will see her back in 2 months.  Signed, Candee Furbish, MD [facility]  01/14/2014 4:48 PM

## 2014-01-14 NOTE — Patient Instructions (Addendum)
Your physician has recommended you make the following change in your medication:   1.TAKE 1 5 MG TABLET OF METOLAZONE IN MORNING THEN TAKE METOLAZONE  5 MG AS NEEDED  2. TAKE LASIX 80-80 FOR 3 DAYS IN THE MORNING   Your physician recommends that you schedule a follow-up appointment in: 2 MONTHS WITH DR Marlou Porch

## 2014-01-18 ENCOUNTER — Ambulatory Visit (INDEPENDENT_AMBULATORY_CARE_PROVIDER_SITE_OTHER): Payer: Medicare Other | Admitting: Internal Medicine

## 2014-01-18 ENCOUNTER — Encounter (INDEPENDENT_AMBULATORY_CARE_PROVIDER_SITE_OTHER): Payer: Self-pay

## 2014-01-18 ENCOUNTER — Encounter: Payer: Self-pay | Admitting: Internal Medicine

## 2014-01-18 VITALS — BP 146/70 | HR 80 | Ht 63.0 in | Wt 225.0 lb

## 2014-01-18 DIAGNOSIS — I509 Heart failure, unspecified: Secondary | ICD-10-CM

## 2014-01-18 DIAGNOSIS — Z950 Presence of cardiac pacemaker: Secondary | ICD-10-CM

## 2014-01-18 DIAGNOSIS — R001 Bradycardia, unspecified: Secondary | ICD-10-CM

## 2014-01-18 DIAGNOSIS — I4891 Unspecified atrial fibrillation: Secondary | ICD-10-CM

## 2014-01-18 DIAGNOSIS — I498 Other specified cardiac arrhythmias: Secondary | ICD-10-CM

## 2014-01-18 DIAGNOSIS — I480 Paroxysmal atrial fibrillation: Secondary | ICD-10-CM

## 2014-01-18 DIAGNOSIS — I495 Sick sinus syndrome: Secondary | ICD-10-CM

## 2014-01-18 DIAGNOSIS — I5042 Chronic combined systolic (congestive) and diastolic (congestive) heart failure: Secondary | ICD-10-CM

## 2014-01-18 LAB — MDC_IDC_ENUM_SESS_TYPE_INCLINIC
Battery Voltage: 2.8 V
Brady Statistic AS VP Percent: 0.1 % — CL
Brady Statistic AS VS Percent: 19.6 %
Lead Channel Impedance Value: 620 Ohm
Lead Channel Impedance Value: 722 Ohm
Lead Channel Pacing Threshold Amplitude: 0.75 V
Lead Channel Pacing Threshold Amplitude: 0.75 V
Lead Channel Pacing Threshold Pulse Width: 0.4 ms
Lead Channel Pacing Threshold Pulse Width: 0.4 ms
Lead Channel Sensing Intrinsic Amplitude: 1.4 mV
Lead Channel Sensing Intrinsic Amplitude: 5.6 mV
Lead Channel Setting Pacing Amplitude: 3.5 V
Lead Channel Setting Sensing Sensitivity: 4 mV
MDC IDC MSMT BATTERY IMPEDANCE: 100 Ohm
MDC IDC SET LEADCHNL RV PACING AMPLITUDE: 3.5 V
MDC IDC SET LEADCHNL RV PACING PULSEWIDTH: 0.4 ms
MDC IDC STAT BRADY AP VP PERCENT: 1.7 %
MDC IDC STAT BRADY AP VS PERCENT: 78.7 %

## 2014-01-18 NOTE — Patient Instructions (Signed)
Your physician recommends that you continue on your current medications as directed. Please refer to the Current Medication list given to you today.  Remote monitoring is used to monitor your Pacemaker of ICD from home. This monitoring reduces the number of office visits required to check your device to one time per year. It allows [place] to keep an eye on the functioning of your device to ensure it is working properly. You are scheduled for a device check from home on 04/22/14. You may send your transmission at any time that day. If you have a wireless device, the transmission will be sent automatically. After your physician reviews your transmission, you will receive a postcard with your next transmission date.  Your physician wants you to follow-up in: 1 year with [employer], PA-C.  You will receive a reminder letter in the mail two months in advance. If you don't receive a letter, please call our office to schedule the follow-up appointment.

## 2014-01-20 ENCOUNTER — Encounter: Payer: Self-pay | Admitting: Internal Medicine

## 2014-01-20 DIAGNOSIS — I495 Sick sinus syndrome: Secondary | ICD-10-CM | POA: Insufficient documentation

## 2014-01-20 NOTE — Progress Notes (Signed)
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  Since her recent pacemaker implanation, the patient reports doing very well.  Today, she denies symptoms of palpitations, chest pain, shortness of breath,  lower extremity edema, dizziness, presyncope, or syncope.  The patient is otherwise without complaint today.   Past Medical History  Diagnosis Date  . Coronary artery disease 09/04/08    single vessel CABG at time of AVR   . Aortic stenosis 09/04/08    s/p AVR by Dr Servando Snare (tissue valve)  . Paroxysmal atrial fibrillation     remote history of PAF  . LBBB (left bundle branch block)   . CHF (congestive heart failure)     diastolic  . OSA (obstructive sleep apnea)   . Alzheimer's dementia   . Diabetes mellitus     type 2  . Hypertension   . Chronic anemia   . Osteoporosis   . Chronic renal failure   . Morbid obesity with BMI of 50.0-59.9, adult 03/09/2012  . Peripheral vascular disease   . Arthritis   . Chronic diastolic heart failure 38/18/2993  . Chronic right ear pain     "nerve damage"  . Atrial flutter   . Anemia in chronic kidney disease   . Major depressive disorder   . Alopecia   . Benign hypertensive kidney disease with chronic kidney disease stage I through stage IV, or unspecified   . Type II or unspecified type diabetes mellitus with neurological manifestations, not stated as uncontrolled   . Pure hypercholesterolemia   . Coronary atherosclerosis of native coronary artery   . Obesity   . GI bleed 08/2013  . Sick sinus syndrome     s/p PPM   Past Surgical History  Procedure Laterality Date  . Coronary artery bypass graft  09/04/08    single vessel at time of AVR  . Aortic valve replacement  12/09    tissue valve by Dr Servando Snare  . Cholecystectomy  1999  . Cataract extraction  2002  . Replacement total knee bilateral      L 2000, R 1997  .  Appendectomy    . Closed reduction femoral shaft fracture  2007    R intramedullary rod  . Tee without cardioversion N/A 02/07/2013    Procedure: TRANSESOPHAGEAL ECHOCARDIOGRAM (TEE);  Surgeon: Candee Furbish, MD;  Location: [facility] ENDOSCOPY;  Service: Cardiovascular;  Laterality: N/A;  . Pacemaker insertion  09/14/2013    MDT Adapta L implanted by Dr Lovena Le for SSS    Current Outpatient Prescriptions  Medication Sig Dispense Refill  . acetaminophen (TYLENOL) 325 MG tablet Take 162.5 mg by mouth every 6 (six) hours as needed for mild pain. For pain      . albuterol (PROVENTIL HFA;VENTOLIN HFA) 108 (90 BASE) MCG/ACT inhaler Inhale 2 puffs into the lungs every 6 (six) hours as needed for wheezing.  1 Inhaler  2  . amiodarone (PACERONE) 100 MG tablet Take 1 tablet (100 mg total) by mouth 2 (two) times daily.  60 tablet  12  . apixaban (ELIQUIS) 2.5 MG TABS tablet Take 1 tablet (2.5 mg total) by mouth 2 (two) times daily.  60 tablet  4  . carbamide peroxide (DEBROX) 6.5 % otic solution Place 5 drops into the right ear 2 (two) times daily.  15 mL  0  . diclofenac sodium (VOLTAREN) 1 % GEL Apply topically 4 (four) times  daily.      . docusate sodium (COLACE) 100 MG capsule Take 1 capsule (100 mg total) by mouth 2 (two) times daily as needed. For constipation  30 capsule  0  . donepezil (ARICEPT) 10 MG tablet Take 1 tablet (10 mg total) by mouth at bedtime.  30 tablet  0  . ferrous sulfate 325 (65 FE) MG tablet Take 1 tablet (325 mg total) by mouth 2 (two) times daily.  60 tablet  0  . fish oil-omega-3 fatty acids 1000 MG capsule Take 1 g by mouth daily.      . furosemide (LASIX) 80 MG tablet Take 40-80 mg by mouth. Takes 80 mg in the morning and 40 mg in the evening      . gabapentin (NEURONTIN) 600 MG tablet Take 300-600 mg by mouth. Take 1/2 tab in the morning and 1 tab at bedtime      . hydrocortisone (ANUSOL-HC) 25 MG suppository Place 1 suppository (25 mg total) rectally 2 (two) times daily as needed  for hemorrhoids or itching.  12 suppository  0  . insulin glargine (LANTUS) 100 UNIT/ML injection Inject 24 Units into the skin every evening.      . lactobacillus acidophilus (BACID) TABS tablet Take 1 tablet by mouth daily.       . metolazone (ZAROXOLYN) 5 MG tablet TAKE 1 TABLET BY MOUTH AS NEEDED  90 tablet  0  . Multiple Vitamin (MULTIVITAMIN) tablet Take 1 tablet by mouth daily.       . Multiple Vitamins-Minerals (PRESERVISION AREDS PO) Take 1 capsule by mouth daily.      Marland Kitchen PARoxetine (PAXIL) 40 MG tablet Take 1 tablet (40 mg total) by mouth every morning.  30 tablet  1  . potassium chloride SA (K-DUR,KLOR-CON) 20 MEQ tablet Take 20 mEq by mouth 3 (three) times daily.      . Sennosides (SENNA) 15 MG TABS Take 1 tablet by mouth as needed. For constipation      . simvastatin (ZOCOR) 10 MG tablet TAKE 1 TABLET BY MOUTH AT BEDTIME  90 tablet  0  . spironolactone (ALDACTONE) 12.5 mg TABS tablet Take 0.5 tablets (12.5 mg total) by mouth 2 (two) times daily.  60 tablet  6   No current facility-administered medications for this visit.    Physical Exam: Filed Vitals:   01/18/14 1539  BP: 146/70  Pulse: 80  Height: 5\' 3"  (1.6 m)  Weight: 225 lb (102.059 kg)    GEN- The patient is well appearing, alert and oriented x 3 today.   Head- normocephalic, atraumatic Eyes-  Sclera clear, conjunctiva pink Ears- hearing intact Oropharynx- clear Lungs- Clear to ausculation bilaterally, normal work of breathing Chest- pacemaker pocket is well healed Heart- Regular rate and rhythm, no murmurs, rubs or gallops, PMI not laterally displaced GI- soft, NT, ND, + BS Extremities- no clubbing, cyanosis, or edema  Pacemaker interrogation- reviewed in detail today,  See PACEART report  Assessment and Plan:  1. Sinus bradycardia Normal pacemaker function See Pace Art report No changes today  2. Atrial fibrillation chads2vasc score is at least 6. She is anticoagulated with eliquis.  I will defer  dosing to Dr Marlou Porch  3. htn Stable No change required today  4. Cad No ischemic symptoms  [facility] Return to see Jerene Pitch in the device clinic in 1 year Follow-up with Dr Marlou Porch as schedule

## 2014-02-07 ENCOUNTER — Telehealth: Payer: Self-pay | Admitting: Cardiology

## 2014-02-07 NOTE — Telephone Encounter (Signed)
Received request from Nurse fax box, documents faxed for surgical clearance. To: [employer] number: [phone] Attention: 5.14.15/kdm

## 2014-02-08 ENCOUNTER — Telehealth: Payer: Self-pay | Admitting: Cardiology

## 2014-02-08 NOTE — Telephone Encounter (Signed)
Received request from Nurse fax box, documents faxed for surgical clearance. To: [employer] number: [phone] Attention: 5.15.15/kdm

## 2014-02-11 ENCOUNTER — Telehealth: Payer: Self-pay | Admitting: Cardiology

## 2014-02-11 NOTE — Telephone Encounter (Signed)
New message    Office calling stating they are not on Epic . Can fax request be sent back over.

## 2014-02-12 ENCOUNTER — Emergency Department (HOSPITAL_COMMUNITY): Payer: Medicare Other

## 2014-02-12 ENCOUNTER — Encounter (HOSPITAL_COMMUNITY): Payer: Self-pay | Admitting: Emergency Medicine

## 2014-02-12 ENCOUNTER — Emergency Department (HOSPITAL_COMMUNITY)
Admission: EM | Admit: 2014-02-12 | Discharge: 2014-02-13 | Disposition: A | Discharge status: Home / Self Care | Payer: Medicare Other | Attending: Emergency Medicine | Admitting: Emergency Medicine

## 2014-02-12 DIAGNOSIS — Z794 Long term (current) use of insulin: Secondary | ICD-10-CM | POA: Diagnosis not present

## 2014-02-12 DIAGNOSIS — Z7901 Long term (current) use of anticoagulants: Secondary | ICD-10-CM | POA: Diagnosis not present

## 2014-02-12 DIAGNOSIS — Z96659 Presence of unspecified artificial knee joint: Secondary | ICD-10-CM | POA: Diagnosis not present

## 2014-02-12 DIAGNOSIS — K429 Umbilical hernia without obstruction or gangrene: Secondary | ICD-10-CM | POA: Insufficient documentation

## 2014-02-12 DIAGNOSIS — I4892 Unspecified atrial flutter: Secondary | ICD-10-CM | POA: Diagnosis not present

## 2014-02-12 DIAGNOSIS — I129 Hypertensive chronic kidney disease with stage 1 through stage 4 chronic kidney disease, or unspecified chronic kidney disease: Secondary | ICD-10-CM | POA: Insufficient documentation

## 2014-02-12 DIAGNOSIS — Z6841 Body Mass Index (BMI) 40.0 and over, adult: Secondary | ICD-10-CM | POA: Insufficient documentation

## 2014-02-12 DIAGNOSIS — R0602 Shortness of breath: Secondary | ICD-10-CM | POA: Diagnosis present

## 2014-02-12 DIAGNOSIS — E7800 Pure hypercholesterolemia, unspecified: Secondary | ICD-10-CM | POA: Diagnosis not present

## 2014-02-12 DIAGNOSIS — I4891 Unspecified atrial fibrillation: Secondary | ICD-10-CM | POA: Insufficient documentation

## 2014-02-12 DIAGNOSIS — G4733 Obstructive sleep apnea (adult) (pediatric): Secondary | ICD-10-CM | POA: Diagnosis not present

## 2014-02-12 DIAGNOSIS — I2510 Atherosclerotic heart disease of native coronary artery without angina pectoris: Secondary | ICD-10-CM | POA: Diagnosis not present

## 2014-02-12 DIAGNOSIS — Z79899 Other long term (current) drug therapy: Secondary | ICD-10-CM | POA: Insufficient documentation

## 2014-02-12 DIAGNOSIS — Z950 Presence of cardiac pacemaker: Secondary | ICD-10-CM | POA: Insufficient documentation

## 2014-02-12 DIAGNOSIS — N189 Chronic kidney disease, unspecified: Secondary | ICD-10-CM | POA: Insufficient documentation

## 2014-02-12 DIAGNOSIS — R61 Generalized hyperhidrosis: Secondary | ICD-10-CM | POA: Insufficient documentation

## 2014-02-12 DIAGNOSIS — F329 Major depressive disorder, single episode, unspecified: Secondary | ICD-10-CM | POA: Diagnosis not present

## 2014-02-12 DIAGNOSIS — I5032 Chronic diastolic (congestive) heart failure: Secondary | ICD-10-CM | POA: Diagnosis not present

## 2014-02-12 DIAGNOSIS — G309 Alzheimer's disease, unspecified: Secondary | ICD-10-CM | POA: Insufficient documentation

## 2014-02-12 DIAGNOSIS — M25569 Pain in unspecified knee: Secondary | ICD-10-CM | POA: Insufficient documentation

## 2014-02-12 DIAGNOSIS — Z951 Presence of aortocoronary bypass graft: Secondary | ICD-10-CM | POA: Insufficient documentation

## 2014-02-12 DIAGNOSIS — Z954 Presence of other heart-valve replacement: Secondary | ICD-10-CM | POA: Diagnosis not present

## 2014-02-12 DIAGNOSIS — R262 Difficulty in walking, not elsewhere classified: Secondary | ICD-10-CM | POA: Diagnosis not present

## 2014-02-12 DIAGNOSIS — G8929 Other chronic pain: Secondary | ICD-10-CM | POA: Insufficient documentation

## 2014-02-12 DIAGNOSIS — E1149 Type 2 diabetes mellitus with other diabetic neurological complication: Secondary | ICD-10-CM | POA: Diagnosis not present

## 2014-02-12 DIAGNOSIS — F0280 Dementia in other diseases classified elsewhere without behavioral disturbance: Secondary | ICD-10-CM | POA: Insufficient documentation

## 2014-02-12 DIAGNOSIS — M545 Low back pain, unspecified: Secondary | ICD-10-CM | POA: Diagnosis not present

## 2014-02-12 DIAGNOSIS — M810 Age-related osteoporosis without current pathological fracture: Secondary | ICD-10-CM | POA: Diagnosis not present

## 2014-02-12 DIAGNOSIS — F0390 Unspecified dementia without behavioral disturbance: Secondary | ICD-10-CM | POA: Diagnosis not present

## 2014-02-12 DIAGNOSIS — D631 Anemia in chronic kidney disease: Secondary | ICD-10-CM | POA: Insufficient documentation

## 2014-02-12 DIAGNOSIS — N039 Chronic nephritic syndrome with unspecified morphologic changes: Secondary | ICD-10-CM

## 2014-02-12 DIAGNOSIS — L659 Nonscarring hair loss, unspecified: Secondary | ICD-10-CM | POA: Insufficient documentation

## 2014-02-12 LAB — BASIC METABOLIC PANEL
BUN: 34 mg/dL — ABNORMAL HIGH (ref 6–23)
CALCIUM: 10.4 mg/dL (ref 8.4–10.5)
CO2: 25 mEq/L (ref 19–32)
Chloride: 95 mEq/L — ABNORMAL LOW (ref 96–112)
Creatinine, Ser: 1.55 mg/dL — ABNORMAL HIGH (ref 0.50–1.10)
GFR calc non Af Amer: 31 mL/min — ABNORMAL LOW (ref >90)
GFR, EST AFRICAN AMERICAN: 36 mL/min — AB (ref >90)
Glucose, Bld: 138 mg/dL — ABNORMAL HIGH (ref 70–99)
Potassium: 4.6 mEq/L (ref 3.7–5.3)
SODIUM: 136 meq/L — AB (ref 137–147)

## 2014-02-12 LAB — I-STAT TROPONIN, ED
Troponin i, poc: 0.03 ng/mL (ref 0.00–0.08)
Troponin i, poc: 0.03 ng/mL (ref 0.00–0.08)

## 2014-02-12 LAB — URINALYSIS, ROUTINE W REFLEX MICROSCOPIC
Bilirubin Urine: NEGATIVE
Glucose, UA: NEGATIVE mg/dL
HGB URINE DIPSTICK: NEGATIVE
Ketones, ur: NEGATIVE mg/dL
Leukocytes, UA: NEGATIVE
Nitrite: NEGATIVE
PROTEIN: NEGATIVE mg/dL
SPECIFIC GRAVITY, URINE: 1.009 (ref 1.005–1.030)
Urobilinogen, UA: 0.2 mg/dL (ref 0.0–1.0)
pH: 7 (ref 5.0–8.0)

## 2014-02-12 LAB — CBC
HCT: 43.3 % (ref 36.0–46.0)
HEMOGLOBIN: 14.9 g/dL (ref 12.0–15.0)
MCH: 31 pg (ref 26.0–34.0)
MCHC: 34.4 g/dL (ref 30.0–36.0)
MCV: 90.2 fL (ref 78.0–100.0)
Platelets: 125 10*3/uL — ABNORMAL LOW (ref 150–400)
RBC: 4.8 MIL/uL (ref 3.87–5.11)
RDW: 14.2 % (ref 11.5–15.5)
WBC: 8.1 10*3/uL (ref 4.0–10.5)

## 2014-02-12 LAB — PRO B NATRIURETIC PEPTIDE: PRO B NATRI PEPTIDE: 528.7 pg/mL — AB (ref 0–450)

## 2014-02-12 MED ORDER — POTASSIUM CHLORIDE CRYS ER 20 MEQ PO TBCR
20.0000 meq | EXTENDED_RELEASE_TABLET | Freq: Three times a day (TID) | ORAL | Status: DC
  Administered 2014-02-12 – 2014-02-13 (×2): 20 meq via ORAL
  Filled 2014-02-12 (×2): qty 1

## 2014-02-12 MED ORDER — SENNA 15 MG PO TABS: 15.0000 mg | ORAL_TABLET | Freq: Two times a day (BID) | ORAL | Status: DC

## 2014-02-12 MED ORDER — BACID PO TABS
1.0000 | ORAL_TABLET | Freq: Every day | ORAL | Status: DC
  Administered 2014-02-13: 1 via ORAL
  Filled 2014-02-12: qty 1

## 2014-02-12 MED ORDER — GABAPENTIN 600 MG PO TABS: 300.0000 mg | ORAL_TABLET | ORAL | Status: DC

## 2014-02-12 MED ORDER — SIMVASTATIN 10 MG PO TABS
10.0000 mg | ORAL_TABLET | Freq: Every day | ORAL | Status: DC
  Filled 2014-02-12: qty 1

## 2014-02-12 MED ORDER — APIXABAN 2.5 MG PO TABS
2.5000 mg | ORAL_TABLET | Freq: Two times a day (BID) | ORAL | Status: DC
  Administered 2014-02-12 – 2014-02-13 (×2): 2.5 mg via ORAL
  Filled 2014-02-12 (×3): qty 1

## 2014-02-12 MED ORDER — FERROUS SULFATE 325 (65 FE) MG PO TABS
325.0000 mg | ORAL_TABLET | Freq: Two times a day (BID) | ORAL | Status: DC
  Administered 2014-02-12 – 2014-02-13 (×2): 325 mg via ORAL
  Filled 2014-02-12 (×3): qty 1

## 2014-02-12 MED ORDER — OCUVITE-LUTEIN PO CAPS
1.0000 | ORAL_CAPSULE | Freq: Every day | ORAL | Status: DC
  Administered 2014-02-13: 1 via ORAL
  Filled 2014-02-12: qty 1

## 2014-02-12 MED ORDER — SPIRONOLACTONE 12.5 MG HALF TABLET
12.5000 mg | ORAL_TABLET | Freq: Two times a day (BID) | ORAL | Status: DC
  Administered 2014-02-13: 12.5 mg via ORAL
  Filled 2014-02-12 (×3): qty 1

## 2014-02-12 MED ORDER — FUROSEMIDE 20 MG PO TABS
40.0000 mg | ORAL_TABLET | Freq: Every day | ORAL | Status: DC
  Filled 2014-02-12: qty 2

## 2014-02-12 MED ORDER — OMEGA-3-ACID ETHYL ESTERS 1 G PO CAPS
1.0000 g | ORAL_CAPSULE | Freq: Every day | ORAL | Status: DC
  Administered 2014-02-13: 1 g via ORAL
  Filled 2014-02-12: qty 1

## 2014-02-12 MED ORDER — ALBUTEROL SULFATE HFA 108 (90 BASE) MCG/ACT IN AERS: 2.0000 | INHALATION_SPRAY | Freq: Four times a day (QID) | RESPIRATORY_TRACT | Status: DC | PRN

## 2014-02-12 MED ORDER — AMIODARONE HCL 100 MG PO TABS
100.0000 mg | ORAL_TABLET | Freq: Two times a day (BID) | ORAL | Status: DC
  Administered 2014-02-12 – 2014-02-13 (×2): 100 mg via ORAL
  Filled 2014-02-12 (×3): qty 1

## 2014-02-12 MED ORDER — HYDROCODONE-ACETAMINOPHEN 5-325 MG PO TABS
1.0000 | ORAL_TABLET | Freq: Once | ORAL | Status: AC
  Administered 2014-02-12: 1 via ORAL
  Filled 2014-02-12: qty 1

## 2014-02-12 MED ORDER — INSULIN GLARGINE 100 UNIT/ML ~~LOC~~ SOLN
24.0000 [IU] | Freq: Every evening | SUBCUTANEOUS | Status: DC
  Administered 2014-02-12: 24 [IU] via SUBCUTANEOUS
  Filled 2014-02-12 (×2): qty 0.24

## 2014-02-12 MED ORDER — SENNA 8.6 MG PO TABS
1.0000 | ORAL_TABLET | Freq: Two times a day (BID) | ORAL | Status: DC
  Administered 2014-02-12: 8.6 mg via ORAL
  Filled 2014-02-12 (×4): qty 1

## 2014-02-12 MED ORDER — PAROXETINE HCL 20 MG PO TABS
40.0000 mg | ORAL_TABLET | Freq: Every day | ORAL | Status: DC
  Administered 2014-02-13: 40 mg via ORAL
  Filled 2014-02-12: qty 2

## 2014-02-12 MED ORDER — DONEPEZIL HCL 10 MG PO TABS
10.0000 mg | ORAL_TABLET | Freq: Every day | ORAL | Status: DC
  Administered 2014-02-12: 10 mg via ORAL
  Filled 2014-02-12 (×2): qty 1

## 2014-02-12 MED ORDER — ADULT MULTIVITAMIN W/MINERALS CH
1.0000 | ORAL_TABLET | Freq: Every day | ORAL | Status: DC
  Administered 2014-02-13: 1 via ORAL
  Filled 2014-02-12: qty 1

## 2014-02-12 MED ORDER — DOCUSATE SODIUM 100 MG PO CAPS
100.0000 mg | ORAL_CAPSULE | Freq: Two times a day (BID) | ORAL | Status: DC | PRN
  Filled 2014-02-12 (×3): qty 1

## 2014-02-12 MED ORDER — GABAPENTIN 300 MG PO CAPS
600.0000 mg | ORAL_CAPSULE | ORAL | Status: AC
  Administered 2014-02-12: 600 mg via ORAL
  Filled 2014-02-12 (×2): qty 2

## 2014-02-12 NOTE — ED Notes (Signed)
Spoke with pt's daughter while pt was in testing.  Daughter states that she (and pt) are agreeable to SNF placement as pt has become unable to care for herself in the home when her caregivers (daughter and SIL) are at work.  Pt has become progressively weak, SOB and incontinent of urine.  CSW will pursue NHP in [facility]. Per daughter's request, as well as [employer] auth. Daughter understands that if [facility] does not authorize SNF stay pt will likely be d/c home.  CSW will continue to follow and facilitate placement.

## 2014-02-12 NOTE — ED Notes (Signed)
She states her R leg has been hurting so bad today that she can hardly walk on it. She is due for an injection for back pain which sometimes helps. Caregiver states she looked sob and diaphoretic after moving around at home today so she decided to bring her to the ER

## 2014-02-12 NOTE — ED Notes (Signed)
Pt c/o right leg pain X 5 weeks, pt report she is unable to pin point location, it just hurts all over. sts today while she was walking to the bathroom with her walker she was unable to move because her pain was so bad. Pt has had sx on the same leg. Daughter reports hx of bulging disc in back and 4 weeks ago started getting injections for the pain. Pt reports with exertion she becomes sob. Nad, skin warm and dry resp e/u.

## 2014-02-12 NOTE — ED Notes (Signed)
Patient transported to CT 

## 2014-02-12 NOTE — ED Notes (Signed)
Daughter reports they are her caretakers and would like to seek options about nursing home placement.

## 2014-02-12 NOTE — ED Notes (Signed)
Pharmacy tech at bedside 

## 2014-02-12 NOTE — Progress Notes (Signed)
ED CM consulted by [facility] concerning patient and possible disposition plan. [facility] with worsening leg pain and s/p fall last week. PMH multiple medical conditions including hypertension, hyperlipidemia, diabetes, CHF, CAD , aortic stenosis, bilateral knee replacements, chronic back pain, and urinary incontinence. [employer] Medicare active coverage, confirmed information. ED CM and ED CSW Met with patient and Daughter [name]( primary caregiver) [phone] concerning care goals. Patient and daughter agrees the level of care patient is requiring are skillable needs, daughter works full-time.  Interested in placement. Daughter states, they are interested in [facility] and Ritta Slot mom was there in December 2014. ED CSW will continue to follow up in the morning with placement. No further  ED CM needs identified.

## 2014-02-12 NOTE — ED Provider Notes (Signed)
CSN: [id number]     Arrival date & time 02/12/14  1834 History   First MD Initiated Contact with Patient 02/12/14 2001     Chief Complaint  Patient presents with  . Shortness of Breath   HPI  History provided by the patient and family. Patient is a 80 year old female with multiple medical conditions including hypertension, hyperlipidemia, diabetes, CHF, CAD , aortic stenosis, bilateral knee replacements, chronic back pain who presents with multiple complaints of worsened low back and right lower leg pain as well as episodes of shortness of breath and diaphoresis. This occurred earlier this afternoon while she was trying to ambulate with her walker to the bathroom. Her daughter reports that she became very out of breath and diaphoretic. Patient denies having any chest pain. She does report having increased pain in her leg which was too severe to continue walking. Patient lives with her family at home and is home alone at times during the day when they're at work. Family is concerned about her declining mobility and her symptoms today. Patient denies having any recent fever or chills.   Past Medical History  Diagnosis Date  . Coronary artery disease 09/04/08    single vessel CABG at time of AVR   . Aortic stenosis 09/04/08    s/p AVR by Dr Servando Snare (tissue valve)  . Paroxysmal atrial fibrillation     remote history of PAF  . LBBB (left bundle branch block)   . CHF (congestive heart failure)     diastolic  . OSA (obstructive sleep apnea)   . Alzheimer's dementia   . Diabetes mellitus     type 2  . Hypertension   . Chronic anemia   . Osteoporosis   . Chronic renal failure   . Morbid obesity with BMI of 50.0-59.9, adult 03/09/2012  . Peripheral vascular disease   . Arthritis   . Chronic diastolic heart failure 92/42/6834  . Chronic right ear pain     "nerve damage"  . Atrial flutter   . Anemia in chronic kidney disease   . Major depressive disorder   . Alopecia   . Benign hypertensive  kidney disease with chronic kidney disease stage I through stage IV, or unspecified   . Type II or unspecified type diabetes mellitus with neurological manifestations, not stated as uncontrolled   . Pure hypercholesterolemia   . Coronary atherosclerosis of native coronary artery   . Obesity   . GI bleed 08/2013  . Sick sinus syndrome     s/p PPM   Past Surgical History  Procedure Laterality Date  . Coronary artery bypass graft  09/04/08    single vessel at time of AVR  . Aortic valve replacement  12/09    tissue valve by Dr Servando Snare  . Cholecystectomy  1999  . Cataract extraction  2002  . Replacement total knee bilateral      L 2000, R 1997  . Appendectomy    . Closed reduction femoral shaft fracture  2007    R intramedullary rod  . Tee without cardioversion N/A 02/07/2013    Procedure: TRANSESOPHAGEAL ECHOCARDIOGRAM (TEE);  Surgeon: Candee Furbish, MD;  Location: [facility] ENDOSCOPY;  Service: Cardiovascular;  Laterality: N/A;  . Pacemaker insertion  09/14/2013    MDT Adapta L implanted by Dr Lovena Le for SSS   Family History  Problem Relation Age of Onset  . Other Father 59    deceased. multiple MI   . Heart attack Father     multiple MIs  before death  . Diabetes Father     DM  . CAD Father   . Lung cancer Mother 61    died  . Cancer Mother     colon  . Hypertension Mother   . Hypertension Sister   . Multiple sclerosis Brother   . Diabetes Sister    History  Substance Use Topics  . Smoking status: Never Smoker   . Smokeless tobacco: Never Used  . Alcohol Use: No   OB History   Grav Para Term Preterm Abortions TAB SAB Ect Mult Living                 Review of Systems  Constitutional: Positive for diaphoresis. Negative for fever and chills.  Respiratory: Positive for shortness of breath.   Cardiovascular: Negative for chest pain and leg swelling.  Gastrointestinal: Negative for nausea, vomiting, abdominal pain, diarrhea and constipation.  Musculoskeletal: Positive for  back pain and myalgias.  All other systems reviewed and are negative.     Allergies  Morphine and related; Celebrex; Codeine; Oxycodone; Sulfa antibiotics; Wellbutrin; and Zyrtec  Home Medications   Prior to Admission medications   Medication Sig Start Date End Date Taking? Authorizing Provider  acetaminophen (TYLENOL) 500 MG tablet Take 500 mg by mouth every 8 (eight) hours as needed for mild pain.   Yes Historical Provider, MD  amiodarone (PACERONE) 100 MG tablet Take 1 tablet (100 mg total) by mouth 2 (two) times daily. 02/09/13  Yes Candee Furbish, MD  apixaban (ELIQUIS) 2.5 MG TABS tablet Take 1 tablet (2.5 mg total) by mouth 2 (two) times daily. 10/03/13  Yes Candee Furbish, MD  docusate sodium (COLACE) 100 MG capsule Take 1 capsule (100 mg total) by mouth 2 (two) times daily as needed. For constipation 08/14/12  Yes Vivi Ferns, MD  donepezil (ARICEPT) 10 MG tablet Take 1 tablet (10 mg total) by mouth at bedtime. 08/11/12  Yes Jacquelyn A McGill, MD  ferrous sulfate 325 (65 FE) MG tablet Take 1 tablet (325 mg total) by mouth 2 (two) times daily. 08/11/12  Yes Jacquelyn A McGill, MD  fish oil-omega-3 fatty acids 1000 MG capsule Take 1 g by mouth daily. 08/04/12  Yes Theodis Blaze, MD  furosemide (LASIX) 80 MG tablet Take 40-80 mg by mouth. Takes 80 mg in the morning and 40 mg in the evening   Yes Historical Provider, MD  gabapentin (NEURONTIN) 600 MG tablet Take 300-600 mg by mouth See admin instructions. Take 300 tab in the morning and 600 tab at bedtime 08/11/12  Yes Jacquelyn A McGill, MD  insulin glargine (LANTUS) 100 UNIT/ML injection Inject 24 Units into the skin every evening. 08/11/12  Yes Jacquelyn A McGill, MD  lactobacillus acidophilus (BACID) TABS tablet Take 1 tablet by mouth daily.    Yes Historical Provider, MD  Multiple Vitamin (MULTIVITAMIN) tablet Take 1 tablet by mouth daily.    Yes Historical Provider, MD  Multiple Vitamins-Minerals (PRESERVISION AREDS PO) Take 1 capsule by  mouth daily.   Yes Historical Provider, MD  PARoxetine (PAXIL) 40 MG tablet Take 1 tablet (40 mg total) by mouth every morning. 08/04/12  Yes Theodis Blaze, MD  potassium chloride SA (K-DUR,KLOR-CON) 20 MEQ tablet Take 20 mEq by mouth 3 (three) times daily.   Yes Historical Provider, MD  Sennosides (SENNA) 15 MG TABS Take 15 mg by mouth 2 (two) times daily. For constipation   Yes Historical Provider, MD  simvastatin (ZOCOR) 10 MG tablet Take 10  mg by mouth daily at 6 PM.   Yes Historical Provider, MD  spironolactone (ALDACTONE) 12.5 mg TABS tablet Take 0.5 tablets (12.5 mg total) by mouth 2 (two) times daily. 08/20/13  Yes Candee Furbish, MD  albuterol (PROVENTIL HFA;VENTOLIN HFA) 108 (90 BASE) MCG/ACT inhaler Inhale 2 puffs into the lungs every 6 (six) hours as needed for wheezing. 07/26/13   Belkys A Regalado, MD  FLUCONAZOLE PO Take 1 tablet by mouth daily.    Historical Provider, MD   BP 124/50  Pulse 66  Temp(Src) 98.4 F (36.9 C) (Oral)  Resp 17  Ht 5\' 2"  (1.575 m)  Wt 230 lb (104.327 kg)  BMI 42.06 kg/m2  SpO2 100% Physical Exam  Nursing note and vitals reviewed. Constitutional: She is oriented to person, place, and time. She appears well-developed and well-nourished. No distress.  Obese.  HENT:  Head: Normocephalic.  Cardiovascular: Normal rate and regular rhythm.   Pulmonary/Chest: Effort normal and breath sounds normal. No respiratory distress. She has no wheezes. She has no rales.  Pacemaker in left chest  Abdominal: Soft. There is no tenderness.  Musculoskeletal:       Lumbar back: She exhibits tenderness. She exhibits no edema.       Back:  Surgical scars of her anterior bilateral knees consistent with surgery. There is diffuse tenderness to light touch around the right knee and thigh. There is no significant swelling or asymmetry between the legs. Normal dorsal pedal pulses bilaterally. Equal sensations. Equal strength. Multiple varicose veins and skin changes of chronic  venous insufficiency.  Neurological: She is alert and oriented to person, place, and time.  Skin: Skin is warm and dry. No rash noted. She is not diaphoretic.  Psychiatric: She has a normal mood and affect. Her behavior is normal.    ED Course  Procedures   COORDINATION OF CARE:  Nursing notes reviewed. Vital signs reviewed. Initial pt interview and examination performed.   Filed Vitals:   02/12/14 1843 02/12/14 1930 02/12/14 2000 02/12/14 2015  BP: 146/79 138/52 121/44 124/50  Pulse: 69 62 61 66  Temp: 98.4 F (36.9 C)     TempSrc: Oral     Resp: 17 17    Height: 5\' 2"  (1.575 m)     Weight: 230 lb (104.327 kg)     SpO2: 100% 100% 100% 100%    8:34 PM-patient seen and evaluated. Patient resting appears comfortable normal respirations and O2 sats. Clear lungs. Denies any shortness of breath currently or chest pain. Does have significant pains in the low back and down the right leg. Unable to stand or bear weight.  9:25PM Pt seen and discussed with Attending Physician.  Will get second troponin. Will also had x-rays of the right leg. He contacted both social work and case management who will come and evaluate patient and family for additional resources.  10:15PM social work and case management has seen and evaluated patient and family. They will be able to place the patient in a care facility tomorrow strain from the ED. At this time we'll put in a holding orders and move patient to Pod C.  x-rays of her lower extremities show possible concerns for fractures about the knee replacements. Findings discussed with attending physician and we will perform CT scans for better evaluation of the pelvis, femur and knees.  CT scans do not show any acute fractures. At this time patient will still wait possible skilled nursing placement.   Treatment plan initiated:Medications - No data  to display  Results for orders placed during the hospital encounter of 02/12/14  CBC      Result Value Ref  Range   WBC 8.1  4.0 - 10.5 K/uL   RBC 4.80  3.87 - 5.11 MIL/uL   Hemoglobin 14.9  12.0 - 15.0 g/dL   HCT 43.3  36.0 - 46.0 %   MCV 90.2  78.0 - 100.0 fL   MCH 31.0  26.0 - 34.0 pg   MCHC 34.4  30.0 - 36.0 g/dL   RDW 14.2  11.5 - 15.5 %   Platelets 125 (*) 150 - 400 K/uL  BASIC METABOLIC PANEL      Result Value Ref Range   Sodium 136 (*) 137 - 147 mEq/L   Potassium 4.6  3.7 - 5.3 mEq/L   Chloride 95 (*) 96 - 112 mEq/L   CO2 25  19 - 32 mEq/L   Glucose, Bld 138 (*) 70 - 99 mg/dL   BUN 34 (*) 6 - 23 mg/dL   Creatinine, Ser 1.55 (*) 0.50 - 1.10 mg/dL   Calcium 10.4  8.4 - 10.5 mg/dL   GFR calc non Af Amer 31 (*) >90 mL/min   GFR calc Af Amer 36 (*) >90 mL/min  PRO B NATRIURETIC PEPTIDE      Result Value Ref Range   Pro B Natriuretic peptide (BNP) 528.7 (*) 0 - 450 pg/mL  URINALYSIS, ROUTINE W REFLEX MICROSCOPIC      Result Value Ref Range   Color, Urine YELLOW  YELLOW   APPearance CLEAR  CLEAR   Specific Gravity, Urine 1.009  1.005 - 1.030   pH 7.0  5.0 - 8.0   Glucose, UA NEGATIVE  NEGATIVE mg/dL   Hgb urine dipstick NEGATIVE  NEGATIVE   Bilirubin Urine NEGATIVE  NEGATIVE   Ketones, ur NEGATIVE  NEGATIVE mg/dL   Protein, ur NEGATIVE  NEGATIVE mg/dL   Urobilinogen, UA 0.2  0.0 - 1.0 mg/dL   Nitrite NEGATIVE  NEGATIVE   Leukocytes, UA NEGATIVE  NEGATIVE  I-STAT TROPOININ, ED      Result Value Ref Range   Troponin i, poc 0.03  0.00 - 0.08 ng/mL   Comment 3                 Imaging Review Dg Chest 2 View  02/12/2014   CLINICAL DATA:  LEG PAIN  EXAM: CHEST  2 VIEW  COMPARISON:  None.  FINDINGS: The cardiac silhouette is enlarged and stable. Stable left chest wall cardiac pacer. Mild prominence of interstitial markings. No evidence peribronchial cuffing. No focal regions of consolidation no focal infiltrates. No acute osseous abnormalities.  IMPRESSION: Chronic bronchitic changes no acute cardiopulmonary disease.   Electronically Signed   By: Margaree Mackintosh M.D.   On:  02/12/2014 20:27   Dg Femur Left  02/12/2014   CLINICAL DATA:  Right leg swelling for the past week. Bilateral knee pain.  EXAM: LEFT FEMUR - 2 VIEW  COMPARISON:  No priors.  FINDINGS: Proximal aspect of the left femur is normal in appearance. Postoperative changes of left total knee arthroplasty are noted. The oblique lateral projection of the distal left femur demonstrates some irregularity in the posterior aspect of the femur in the condylar region, which is poorly demonstrated on today's examination.  IMPRESSION: 1. Status post left total knee arthroplasty. Osseous irregularity in the posterior aspect of the distal left femur poorly visualized on today's femur radiographs. Dedicated the radiographs are recommended to better evaluate this  region. These results were called by telephone at the time of interpretation on 02/12/2014 at 10:48 PM to Dr. Shirlyn Goltz, who verbally acknowledged these results.   Electronically Signed   By: Vinnie Langton M.D.   On: 02/12/2014 22:48   Dg Femur Right  02/12/2014   CLINICAL DATA:  Right leg swelling for the past week.  EXAM: RIGHT FEMUR - 2 VIEW  COMPARISON:  04/13/2006.  FINDINGS: Postoperative changes of right total knee arthroplasty are again noted. There is an intra medullary rod in the distal half of the femoral diaphysis. There are 2 distal and 1 proximal fixation screws further intra medullary rod. The middle of these screws is fractured with some mild angulation. Well-defined callus is noted in the distal femoral diaphysis at site of healed fracture. No definite acute displaced fracture is noted on today's examination. Femoral head is properly located.  IMPRESSION: 1. Postoperative changes of ORIF in the distal femur and total knee arthroplasty redemonstrated, as above. There is a fractured fixation screw associated with the intra medullary rod, but no other acute findings noted.   Electronically Signed   By: Vinnie Langton M.D.   On: 02/12/2014 22:38   Ct  Pelvis Wo Contrast  02/13/2014   CLINICAL DATA:  pain eval for hematoma  EXAM: CT PELVIS WITHOUT CONTRAST  TECHNIQUE: Multidetector CT imaging of the pelvis was performed following the standard protocol without intravenous contrast.  COMPARISON:  None.  FINDINGS: No intrapelvic masses, free fluid, loculated fluid collections nor adenopathy. A small fat containing periumbilical hernia. The visualized portions of the bowel demonstrate diverticulosis within the sigmoid colon otherwise negative. Air within the nondependent portion of the urinary bladder. This may represent the sequela of prior instrumentation.  The bones are osteopenic. There is no evidence of fracture, dislocation, nor malalignment. Degenerative changes are appreciated within the hips and sacroiliac joints. Degenerative disc disease changes within the lower lumbar spine.  IMPRESSION: Small periumbilical hernia and thickening diverticulosis in the sigmoid colon otherwise no CT evidence of pelvic pathology.  No acute osseous abnormalities. Degenerative changes are appreciated within the spine, hips, and sacroiliac joints.   Electronically Signed   By: Margaree Mackintosh M.D.   On: 02/13/2014 02:15     EKG Interpretation None      Date: 02/12/2014  Rate: 75  Rhythm: normal sinus rhythm atrial paced  QRS Axis: normal  Intervals: Prolonged AV conduction   ST/T Wave abnormalities: nonspecific ST/T changes  Conduction Disutrbances:nonspecific intraventricular conduction delay  Narrative Interpretation:   Old EKG Reviewed: No significant changes      MDM   Final diagnoses:  Chronic pain  Inability to ambulate due to knee        Martie Lee, PA-C 02/13/14 [phone]

## 2014-02-13 ENCOUNTER — Emergency Department (HOSPITAL_COMMUNITY): Payer: Medicare Other

## 2014-02-13 MED ORDER — GABAPENTIN 300 MG PO CAPS
300.0000 mg | ORAL_CAPSULE | Freq: Every day | ORAL | Status: DC
  Administered 2014-02-13: 300 mg via ORAL
  Filled 2014-02-13: qty 1

## 2014-02-13 MED ORDER — GABAPENTIN 600 MG PO TABS
600.0000 mg | ORAL_TABLET | Freq: Every day | ORAL | Status: DC
  Filled 2014-02-13: qty 1

## 2014-02-13 MED ORDER — FUROSEMIDE 20 MG PO TABS: 40.0000 mg | ORAL_TABLET | Freq: Every day | ORAL | Status: DC

## 2014-02-13 MED ORDER — FUROSEMIDE 20 MG PO TABS
80.0000 mg | ORAL_TABLET | Freq: Every day | ORAL | Status: DC
  Administered 2014-02-13: 80 mg via ORAL
  Filled 2014-02-13: qty 4

## 2014-02-13 NOTE — ED Notes (Signed)
PTR HAS ARRIVED TO TRANSPORT PT TO NURSING HOME 

## 2014-02-13 NOTE — ED Notes (Signed)
PHYSICAL THERAPY HERE TO EVAL PT

## 2014-02-13 NOTE — ED Notes (Signed)
PT HAS RESPONDED AND WILL BE DOWN TO SEE PT SHORTLY

## 2014-02-13 NOTE — Progress Notes (Addendum)
CSW consult to pt regarding SNF placement. CSW spoke with pt who requested [facility]. Pt has been accepted.CSW placed call to [facility] [phone] for authorization. Authorization has been received by [facility]. Pt/family has been made aware of SNF placement status. Pt.'s daughter will complete paperwork for admission. Pt requesting a room mate and window side bed. Necessary paperwork was sent to [facility]. Pt awaiting discharge.   [address] [place]

## 2014-02-13 NOTE — ED Provider Notes (Signed)
Pt handed off to me from the overnight shift Hazel Sams, PA-C to myself.  Patient brought in by family to help arrange placement.   11:10am 02/13/2014 I spoke with Anne Ng, RN. She says that patient is still awaiting placement. All the paperwork has been completed and it is expected she will be placed/ transferred late today or early tomorrow.  Will leave with oncoming Provider.  Linus Mako, PA-C 02/13/14 1112

## 2014-02-13 NOTE — ED Notes (Signed)
PT ON POD C TO AWAIT NURSING HOME PLACEMENT DUE TO NOT FEELING SHE IS SAFE AT HOME DUE TO AMBULATION DIFFICULTIES. STATES HER RIGHT LEG/KNEE IS VERY PAINFUL AND IT IS INCREASINGLY HARD FOR HER TO WALK WITH HER WALKER AND SUPPORT HERSELF ON THE WALKER.

## 2014-02-13 NOTE — ED Provider Notes (Signed)
Medical screening examination/treatment/procedure(s) were conducted as a shared visit with non-physician practitioner(s) and myself.  I personally evaluated the patient during the encounter.  Date: 02/12/2014  Rate: 75  Rhythm: normal sinus rhythm atrial paced  QRS Axis: normal  Intervals: Prolonged AV conduction  ST/T Wave abnormalities: nonspecific ST/T changes  Conduction Disutrbances:nonspecific intraventricular conduction delay  Narrative Interpretation:  Old EKG Reviewed: No significant changes    [name] is a 80 y.o. female hx of bilateral knee replacements, DM, HTN, HL, CAD here with back pain and trouble walking. She was short of breath today and had trouble bearing weight on her legs. Denies any falls. Vitals stable. Small hematoma around both knees and tenderness on palpitation. Nl peripheral pulses. Unable to wear weight on legs. Lungs clear. Also lower spinal tenderness (chronic). Given pain meds, still unable to walk. Xrays and CT showed no occult fracture. Labs and UA unremarkable. Doesn't qualify to get admitted to the hospital. Social work and case management called and will attempt to place from the ED.     [name], MD 02/13/14 [phone]

## 2014-02-13 NOTE — ED Notes (Signed)
Social work in to inform pt that she has been accepted at [facility] care. Pt requesting for her to try other places.

## 2014-02-13 NOTE — ED Notes (Signed)
Returned from CT.

## 2014-02-13 NOTE — ED Notes (Signed)
PTR CALLED TO TRANSPORT PT TO NURSING HOME

## 2014-02-13 NOTE — Evaluation (Signed)
Physical Therapy Evaluation Patient Details Name: [name] MRN: [id number] DOB: 30-Dec-1932 Today's Date: 02/13/2014   History of Present Illness  pt rpesents to the ED with weakness and SOB.    Clinical Impression  Pt very weak and debilitated and sounds as if she has been declining at home for some time.  Pt states she is unable to care for herself and becoming too difficult for her family to care for her.  At this time SNF is safest D/C option.  Will continue to follow.      Follow Up Recommendations SNF    Equipment Recommendations  None recommended by PT    Recommendations for Other Services       Precautions / Restrictions Precautions Precautions: Fall Restrictions Weight Bearing Restrictions: No      Mobility  Bed Mobility Overal bed mobility: Needs Assistance Bed Mobility: Supine to Sit     Supine to sit: Min assist;HOB elevated     General bed mobility comments: cues for sequencing and A with bringing trunk up to sitting.    Transfers Overall transfer level: Needs assistance Equipment used: Rolling walker (2 wheeled) Transfers: Sit to/from Stand Sit to Stand: Min assist;From elevated surface         General transfer comment: cues for UE use, getting closer to bed prior to sitting and use of UEs to control descent to sitting.    Ambulation/Gait Ambulation/Gait assistance: Min assist Ambulation Distance (Feet): 12 Feet Assistive device: Rolling walker (2 wheeled) Gait Pattern/deviations: Step-through pattern;Decreased stride length;Trunk flexed   Gait velocity interpretation: Below normal speed for age/gender General Gait Details: pt moves very slowly and labored.  pt's Bil knees seem to almost buckle at times and pt c/o of Bil shoulders "giving out".    Stairs            Wheelchair Mobility    Modified Rankin (Stroke Patients Only)       Balance Overall balance assessment: Needs assistance Sitting-balance support: Single extremity  supported;Feet unsupported Sitting balance-Leahy Scale: Fair   Postural control: Posterior lean Standing balance support: Bilateral upper extremity supported Standing balance-Leahy Scale: Poor                               Pertinent Vitals/Pain Indicates chronic pain in Bil knees and Bil shoulders.      Home Living Family/patient expects to be discharged to:: Skilled nursing facility                 Additional Comments: pt with [place] aide 8-10 hours a week to assist with bathing, family works during the day    Prior Function Level of Independence: Needs assistance   Gait / Transfers Assistance Needed: uses RW  ADL's / Homemaking Assistance Needed: pt takes bird bath, able to make self something to eat, assist for actual shower        Hand Dominance   Dominant Hand: Right    Extremity/Trunk Assessment   Upper Extremity Assessment: Generalized weakness           Lower Extremity Assessment: Generalized weakness         Communication   Communication: No difficulties  Cognition Arousal/Alertness: Awake/alert Behavior During Therapy: WFL for tasks assessed/performed Overall Cognitive Status: Within Functional Limits for tasks assessed                      General Comments  Exercises        Assessment/Plan    PT Assessment Patient needs continued PT services  PT Diagnosis Difficulty walking;Generalized weakness   PT Problem List Decreased strength;Decreased activity tolerance;Decreased balance;Decreased mobility;Decreased knowledge of use of DME  PT Treatment Interventions DME instruction;Gait training;Functional mobility training;Therapeutic activities;Therapeutic exercise;Balance training;Patient/family education   PT Goals (Current goals can be found in the Care Plan section) Acute Rehab PT Goals Patient Stated Goal: None stated. PT Goal Formulation: With patient Time For Goal Achievement: 02/27/14 Potential to Achieve  Goals: Fair    Frequency Min 2X/week   Barriers to discharge        Co-evaluation               End of Session Equipment Utilized During Treatment: Gait belt Activity Tolerance: Patient limited by fatigue Patient left: in bed;with call bell/phone within reach Nurse Communication: Mobility status    Functional Assessment Tool Used: Clinical Judgement Functional Limitation: Mobility: Walking and moving around Mobility: Walking and Moving Around Current Status (P3825): At least 20 percent but less than 40 percent impaired, limited or restricted Mobility: Walking and Moving Around Goal Status [id number]): At least 1 percent but less than 20 percent impaired, limited or restricted    Time: 0846-0908 PT Time Calculation (min): 22 min   Charges:   PT Evaluation $Initial PT Evaluation Tier I: 1 Procedure PT Treatments $Gait Training: 8-22 mins   PT G Codes:   Functional Assessment Tool Used: Clinical Judgement Functional Limitation: Mobility: Walking and moving around    [employer], [place] [phone] 02/13/2014, 9:31 AM

## 2014-02-14 ENCOUNTER — Encounter: Payer: Self-pay | Admitting: Cardiology

## 2014-02-14 LAB — CBG MONITORING, ED: GLUCOSE-CAPILLARY: 250 mg/dL — AB (ref 70–99)

## 2014-02-14 NOTE — Telephone Encounter (Signed)
Clearance generated and faxed to [facility]

## 2014-02-16 NOTE — ED Provider Notes (Signed)
Medical screening examination/treatment/procedure(s) were performed by non-physician practitioner and as supervising physician I was immediately available for consultation/collaboration.     Fredia Sorrow, MD 02/16/14 [phone]

## 2014-02-26 ENCOUNTER — Encounter: Payer: Self-pay | Admitting: Cardiology

## 2014-03-18 ENCOUNTER — Encounter: Payer: Self-pay | Admitting: Cardiology

## 2014-03-18 ENCOUNTER — Ambulatory Visit (INDEPENDENT_AMBULATORY_CARE_PROVIDER_SITE_OTHER): Payer: Medicare Other | Admitting: Cardiology

## 2014-03-18 VITALS — BP 129/74 | HR 64 | Ht 62.0 in | Wt 247.1 lb

## 2014-03-18 DIAGNOSIS — I498 Other specified cardiac arrhythmias: Secondary | ICD-10-CM

## 2014-03-18 DIAGNOSIS — I5032 Chronic diastolic (congestive) heart failure: Secondary | ICD-10-CM

## 2014-03-18 DIAGNOSIS — G309 Alzheimer's disease, unspecified: Secondary | ICD-10-CM

## 2014-03-18 DIAGNOSIS — I495 Sick sinus syndrome: Secondary | ICD-10-CM

## 2014-03-18 DIAGNOSIS — R001 Bradycardia, unspecified: Secondary | ICD-10-CM

## 2014-03-18 DIAGNOSIS — F0280 Dementia in other diseases classified elsewhere without behavioral disturbance: Secondary | ICD-10-CM

## 2014-03-18 NOTE — Progress Notes (Signed)
[facility]. [address], [address] [place], [place]  [address] Phone: [phone] Fax:  [phone]  Date:  03/18/2014   ID:  [name], DOB 1933-05-23, MRN [id number]  PCP:  Cloyd Stagers, MD   History of Present Illness: [name] is a 80 y.o. female with chronic diastolic heart failure with multiple recurrent hospitalizations, normal EF, bypass with aortic valve replacement in 2009, morbid obesity, type 2 diabetes and bradycardia with pacemaker as well as paroxysmal atrial fibrillation here for followup.  06/15/13-she was taking too metolazone since previous visit. Her weight was up. She was worried about her dog with tooth abscess. [name], her daughter states that she constantly preaches to her about her fluid intake and salt intake. No knee replacement do to poor surgical candidate.  07/19/13 - Ortho with injection in knee. No SHOB. No CP. Knee a little improved. She takes metolazone extra approximately once per week. She still admits to dietary indiscretions. Her weight has been stable over the last month. No shortness of breath, no edema in lower extremities. She is weak. No chest pain.  08/20/13- unfortunately, she was back in the hospital. On 07/23/13. Her primary diagnosis was acute respiratory failure with hypoxia.-Was that this was most likely secondary to COPD exacerbation and pneumonia. Her diastolic heart failure appeared to be clinically compensated. She's had some issues with hyponatremia. She states that she did not get blood work previously by primary physician.  10/02/13-unfortunately she was hospitalized once again with primary diagnosis of GI bleed, probable hemorrhoidal bleeding. Her hemoglobin was stable. Pacemaker was placed due to severe bradycardia. Chronic diastolic heart failure was well controlled. Because of atrial fibrillation, Eliquis 2.5 mg twice a day was started. She used to be on Xarelto but this was changed to Eliquis for thoughts that this  may help reduce her overall GI bleeding risk. Her coronary artery disease status post bypass with stable.  11/05/13-no hospitalization over the past month.  12/03/13-currently her main complaint is leg pain. She sees Dr. Donne Hazel. Not operative candidate. She has increased her weight approximately 3 pounds. Some of this could be adipose tissue because of her limited mobility. She's not short of breath. No crackles.  01/14/14-she saw Dr. Laurence Spates. Injection given. Overall doing well. Leg pain seems to be improved. She continues to drink excessive amounts of tea. Counseled her on this once again. We gave her metolazone 5 mg. Lasix 80/80 for 3 days. Metolazone for one day.  03/18/14-since we last talked, she has been placed in an assisted living because of difficulty ambulating at home. Since being there, she has had paranoia, hallucinations. Exacerbation of vascular dementia likely. She has also gained weight a proximally 17 pounds.   Wt Readings from Last 3 Encounters:  03/18/14 247 lb 1.9 oz (112.093 kg)  02/12/14 230 lb (104.327 kg)  01/18/14 225 lb (102.059 kg)     Past Medical History  Diagnosis Date  . Coronary artery disease 09/04/08    single vessel CABG at time of AVR   . Aortic stenosis 09/04/08    s/p AVR by Dr Servando Snare (tissue valve)  . Paroxysmal atrial fibrillation     remote history of PAF  . LBBB (left bundle branch block)   . CHF (congestive heart failure)     diastolic  . OSA (obstructive sleep apnea)   . Alzheimer's dementia   . Diabetes mellitus     type 2  . Hypertension   . Chronic anemia   .  Osteoporosis   . Chronic renal failure   . Morbid obesity with BMI of 50.0-59.9, adult 03/09/2012  . Peripheral vascular disease   . Arthritis   . Chronic diastolic heart failure 37/06/6268  . Chronic right ear pain     "nerve damage"  . Atrial flutter   . Anemia in chronic kidney disease   . Major depressive disorder   . Alopecia   . Benign hypertensive kidney  disease with chronic kidney disease stage I through stage IV, or unspecified   . Type II or unspecified type diabetes mellitus with neurological manifestations, not stated as uncontrolled   . Pure hypercholesterolemia   . Coronary atherosclerosis of native coronary artery   . Obesity   . GI bleed 08/2013  . Sick sinus syndrome     s/p PPM    Past Surgical History  Procedure Laterality Date  . Coronary artery bypass graft  09/04/08    single vessel at time of AVR  . Aortic valve replacement  12/09    tissue valve by Dr Servando Snare  . Cholecystectomy  1999  . Cataract extraction  2002  . Replacement total knee bilateral      L 2000, R 1997  . Appendectomy    . Closed reduction femoral shaft fracture  2007    R intramedullary rod  . Tee without cardioversion N/A 02/07/2013    Procedure: TRANSESOPHAGEAL ECHOCARDIOGRAM (TEE);  Surgeon: Candee Furbish, MD;  Location: [facility] ENDOSCOPY;  Service: Cardiovascular;  Laterality: N/A;  . Pacemaker insertion  09/14/2013    MDT Adapta L implanted by Dr Lovena Le for SSS    Current Outpatient Prescriptions  Medication Sig Dispense Refill  . acetaminophen (TYLENOL) 500 MG tablet Take 500 mg by mouth every 8 (eight) hours as needed for mild pain.      Marland Kitchen albuterol (PROVENTIL HFA;VENTOLIN HFA) 108 (90 BASE) MCG/ACT inhaler Inhale 2 puffs into the lungs every 6 (six) hours as needed for wheezing.  1 Inhaler  2  . amiodarone (PACERONE) 100 MG tablet Take 1 tablet (100 mg total) by mouth 2 (two) times daily.  60 tablet  12  . apixaban (ELIQUIS) 2.5 MG TABS tablet Take 1 tablet (2.5 mg total) by mouth 2 (two) times daily.  60 tablet  4  . docusate sodium (COLACE) 100 MG capsule Take 1 capsule (100 mg total) by mouth 2 (two) times daily as needed. For constipation  30 capsule  0  . donepezil (ARICEPT) 10 MG tablet Take 1 tablet (10 mg total) by mouth at bedtime.  30 tablet  0  . ferrous sulfate 325 (65 FE) MG tablet Take 1 tablet (325 mg total) by mouth 2 (two) times  daily.  60 tablet  0  . fish oil-omega-3 fatty acids 1000 MG capsule Take 1 g by mouth daily.      Marland Kitchen FLUCONAZOLE PO Take 1 tablet by mouth daily.      . furosemide (LASIX) 80 MG tablet Take 40-80 mg by mouth. Takes 80 mg in the morning and 40 mg in the evening      . gabapentin (NEURONTIN) 600 MG tablet Take 300-600 mg by mouth See admin instructions. Take 300 tab in the morning and 600 tab at bedtime      . insulin glargine (LANTUS) 100 UNIT/ML injection Inject 24 Units into the skin every evening.      . lactobacillus acidophilus (BACID) TABS tablet Take 1 tablet by mouth daily.       . Multiple  Vitamin (MULTIVITAMIN) tablet Take 1 tablet by mouth daily.       . Multiple Vitamins-Minerals (PRESERVISION AREDS PO) Take 1 capsule by mouth daily.      Marland Kitchen PARoxetine (PAXIL) 40 MG tablet Take 40 mg by mouth as directed.      . potassium chloride SA (K-DUR,KLOR-CON) 20 MEQ tablet Take 20 mEq by mouth 3 (three) times daily.      . Sennosides (SENNA) 15 MG TABS Take 15 mg by mouth 2 (two) times daily. For constipation      . simvastatin (ZOCOR) 10 MG tablet Take 10 mg by mouth daily at 6 PM.      . spironolactone (ALDACTONE) 12.5 mg TABS tablet Take 0.5 tablets (12.5 mg total) by mouth 2 (two) times daily.  60 tablet  6   No current facility-administered medications for this visit.    Allergies:    Allergies  Allergen Reactions  . Morphine And Related Other (See Comments)    "went crazy"-Hallucinations  . Celebrex [Celecoxib] Other (See Comments)    Sore mouth  . Codeine Other (See Comments)    Altered mental status  . Oxycodone Other (See Comments)    Altered mental status  . Sulfa Antibiotics Itching    Itching all over body  . Wellbutrin [Bupropion] Other (See Comments)    Hallucinations  . Zyrtec [Cetirizine] Rash    Social History:  The patient  reports that she has never smoked. She has never used smokeless tobacco. She reports that she does not drink alcohol or use illicit drugs.     ROS:  Please see the history of present illness.   Positive for lateral lower extremity discomfort. Bilateral. No syncope, no fevers, no bleeding, no orthopnea   All other systems reviewed and negative.   PHYSICAL EXAM: VS:  BP 129/74  Pulse 64  Ht 5\' 2"  (1.575 m)  Wt 247 lb 1.9 oz (112.093 kg)  BMI 45.19 kg/m2 Well nourished, well developed, in no acute distressIn wheelchair HEENT: normal Neck: no JVD Cardiac:  normal S1, S2; RRR; no murmur no obvious JVD Lungs:  clear to auscultation bilaterally, no wheezing, rhonchi or rales Abd: soft, nontender, no hepatomegalyobese Ext: trace+ bilateral extremity edemaMild discoloration and lateral aspects of legs bilaterally. Chronic venous insufficiency changes. Skin: warm and dry Neuro: no focal abnormalities noted  EKG:  Atrial fibrillation, left bundle branch block, 72    ASSESSMENT AND PLAN:  1. Atrial fibrillation-currently on anticoagulation which was changed to low-dose Eliquis because of hemorrhoidal bleeding.  Well controlled. Last EKG demonstrated sinus/atrial tachycardia. Continue with low-dose amiodarone for maintenance. Unfortunately had severe bradycardia, symptomatic in the hospital setting and a pacemaker was placed in December of 2014.  2. Chronic diastolic heart failure-she is currently stable. Weight has increased. If fluid restricted her to 2 L. I've also increased her Lasix to 80 mg twice a day. She has previously used Metolazone in the past the It is been very challenging to control her over the years. She continues to drink too much fluids. Unfortunately she is had multiple readmissions. 3. Coronary artery disease-status post bypass, doing well, no exertional anginal symptoms. 4. Hypertension-currently well controlled. 5. Diabetes-per primary physician 6. Chronic venous insufficiency-try TED hose. 7. Morbid obesity-continue to encourage weight loss. 8. Pacemaker placement-severe bradycardia. Doing well. She gave me back  today several home monitoring stations. This is continuing to be sent to her house. 9. I will see her back in 2 months.  Signed, Candee Furbish, MD  [facility]  03/18/2014 3:41 PM

## 2014-03-18 NOTE — Patient Instructions (Signed)
INCREASE YOUR FUROSEMIDE TO 20 MG TWICE A DAY  Your physician recommends that you schedule a follow-up appointment in: 2 MONTH OV   RESTRICT FLUIDS TO 2 LITERS A DAY

## 2014-04-22 ENCOUNTER — Encounter: Payer: Medicare Other | Admitting: *Deleted

## 2014-04-22 ENCOUNTER — Ambulatory Visit (INDEPENDENT_AMBULATORY_CARE_PROVIDER_SITE_OTHER): Payer: Medicare Other | Admitting: *Deleted

## 2014-04-22 DIAGNOSIS — I495 Sick sinus syndrome: Secondary | ICD-10-CM

## 2014-04-22 LAB — MDC_IDC_ENUM_SESS_TYPE_INCLINIC
Battery Impedance: 100 Ohm
Battery Remaining Longevity: 148 mo
Battery Voltage: 2.8 V
Brady Statistic AS VS Percent: 6 %
Lead Channel Impedance Value: 553 Ohm
Lead Channel Impedance Value: 612 Ohm
Lead Channel Pacing Threshold Amplitude: 0.75 V
Lead Channel Pacing Threshold Pulse Width: 0.4 ms
Lead Channel Sensing Intrinsic Amplitude: 8 mV
Lead Channel Setting Pacing Amplitude: 2 V
Lead Channel Setting Pacing Amplitude: 2.5 V
Lead Channel Setting Pacing Pulse Width: 0.4 ms
Lead Channel Setting Sensing Sensitivity: 2.8 mV
MDC IDC MSMT LEADCHNL RA PACING THRESHOLD AMPLITUDE: 0.5 V
MDC IDC MSMT LEADCHNL RA PACING THRESHOLD PULSEWIDTH: 0.4 ms
MDC IDC MSMT LEADCHNL RA SENSING INTR AMPL: 5 mV
MDC IDC SESS DTM: 20150727104101
MDC IDC STAT BRADY AP VP PERCENT: 1 %
MDC IDC STAT BRADY AP VS PERCENT: 93 %
MDC IDC STAT BRADY AS VP PERCENT: 0 %

## 2014-04-22 NOTE — Progress Notes (Signed)
Pacemaker check in clinic. Normal device function. Thresholds, sensing, impedances consistent with previous measurements. Device programmed to maximize longevity. 19 mode switches, the longest > 1 hour, + eliquis.  No high ventricular rates noted. Device programmed at appropriate safety margins. Histogram distribution appropriate for patient activity level. Device programmed to optimize intrinsic conduction. Estimated longevity 12.5 years. Patient enrolled in remote follow-up/TTM's with [employer]. Plan to follow every 3 months remotely and see annually in office. Patient education completed.  [facility] 07/24/14.

## 2014-04-23 ENCOUNTER — Encounter: Payer: Self-pay | Admitting: Cardiology

## 2014-05-17 ENCOUNTER — Ambulatory Visit: Payer: Medicare Other | Admitting: Cardiology

## 2014-05-29 ENCOUNTER — Encounter: Payer: Self-pay | Admitting: Internal Medicine

## 2014-06-24 ENCOUNTER — Ambulatory Visit (INDEPENDENT_AMBULATORY_CARE_PROVIDER_SITE_OTHER): Payer: Medicare Other | Admitting: Cardiology

## 2014-06-24 ENCOUNTER — Encounter: Payer: Self-pay | Admitting: Cardiology

## 2014-06-24 VITALS — BP 134/58 | HR 70 | Ht 62.0 in | Wt 234.0 lb

## 2014-06-24 DIAGNOSIS — I509 Heart failure, unspecified: Secondary | ICD-10-CM

## 2014-06-24 DIAGNOSIS — I5042 Chronic combined systolic (congestive) and diastolic (congestive) heart failure: Secondary | ICD-10-CM

## 2014-06-24 DIAGNOSIS — I10 Essential (primary) hypertension: Secondary | ICD-10-CM

## 2014-06-24 NOTE — Progress Notes (Signed)
[facility]. [address], [address] [place], [place]  [address] Phone: [phone] Fax:  [phone]  Date:  06/24/2014   ID:  [name], DOB 1933-02-26, MRN [id number]  PCP:  Wenda Low, MD   History of Present Illness: [name] is a 80 y.o. female with chronic diastolic heart failure with multiple recurrent hospitalizations, normal EF, bypass with aortic valve replacement in 2009, morbid obesity, type 2 diabetes and bradycardia with pacemaker as well as paroxysmal atrial fibrillation here for followup.  06/15/13-she was taking too metolazone since previous visit. Her weight was up. She was worried about her dog with tooth abscess. [name], her daughter states that she constantly preaches to her about her fluid intake and salt intake. No knee replacement do to poor surgical candidate.  07/19/13 - Ortho with injection in knee. No SHOB. No CP. Knee a little improved. She takes metolazone extra approximately once per week. She still admits to dietary indiscretions. Her weight has been stable over the last month. No shortness of breath, no edema in lower extremities. She is weak. No chest pain.  08/20/13- unfortunately, she was back in the hospital. On 07/23/13. Her primary diagnosis was acute respiratory failure with hypoxia.-Was that this was most likely secondary to COPD exacerbation and pneumonia. Her diastolic heart failure appeared to be clinically compensated. She's had some issues with hyponatremia. She states that she did not get blood work previously by primary physician.  10/02/13-unfortunately she was hospitalized once again with primary diagnosis of GI bleed, probable hemorrhoidal bleeding. Her hemoglobin was stable. Pacemaker was placed due to severe bradycardia. Chronic diastolic heart failure was well controlled. Because of atrial fibrillation, Eliquis 2.5 mg twice a day was started. She used to be on Xarelto but this was changed to Eliquis for thoughts that this may help reduce  her overall GI bleeding risk. Her coronary artery disease status post bypass with stable.  11/05/13-no hospitalization over the past month.  12/03/13-currently her main complaint is leg pain. She sees Dr. Donne Hazel. Not operative candidate. She has increased her weight approximately 3 pounds. Some of this could be adipose tissue because of her limited mobility. She's not short of breath. No crackles.  01/14/14-she saw Dr. Laurence Spates. Injection given. Overall doing well. Leg pain seems to be improved. She continues to drink excessive amounts of tea. Counseled her on this once again. We gave her metolazone 5 mg. Lasix 80/80 for 3 days. Metolazone for one day.  03/18/14-since we last talked, she has been placed in an assisted living because of difficulty ambulating at home. Since being there, she has had paranoia, hallucinations. Exacerbation of vascular dementia likely. She has also gained weight approximally 17 pounds.  06/24/14 - bad cough. Lungs clear.    Wt Readings from Last 3 Encounters:  06/24/14 234 lb (106.142 kg)  03/18/14 247 lb 1.9 oz (112.093 kg)  02/12/14 230 lb (104.327 kg)     Past Medical History  Diagnosis Date  . Coronary artery disease 09/04/08    single vessel CABG at time of AVR   . Aortic stenosis 09/04/08    s/p AVR by Dr Servando Snare (tissue valve)  . Paroxysmal atrial fibrillation     remote history of PAF  . LBBB (left bundle branch block)   . CHF (congestive heart failure)     diastolic  . OSA (obstructive sleep apnea)   . Alzheimer's dementia   . Diabetes mellitus     type 2  . Hypertension   .  Chronic anemia   . Osteoporosis   . Chronic renal failure   . Morbid obesity with BMI of 50.0-59.9, adult 03/09/2012  . Peripheral vascular disease   . Arthritis   . Chronic diastolic heart failure 60/06/9322  . Chronic right ear pain     "nerve damage"  . Atrial flutter   . Anemia in chronic kidney disease   . Major depressive disorder   . Alopecia   . Benign  hypertensive kidney disease with chronic kidney disease stage I through stage IV, or unspecified   . Type II or unspecified type diabetes mellitus with neurological manifestations, not stated as uncontrolled   . Pure hypercholesterolemia   . Coronary atherosclerosis of native coronary artery   . Obesity   . GI bleed 08/2013  . Sick sinus syndrome     s/p PPM    Past Surgical History  Procedure Laterality Date  . Coronary artery bypass graft  09/04/08    single vessel at time of AVR  . Aortic valve replacement  12/09    tissue valve by Dr Servando Snare  . Cholecystectomy  1999  . Cataract extraction  2002  . Replacement total knee bilateral      L 2000, R 1997  . Appendectomy    . Closed reduction femoral shaft fracture  2007    R intramedullary rod  . Tee without cardioversion N/A 02/07/2013    Procedure: TRANSESOPHAGEAL ECHOCARDIOGRAM (TEE);  Surgeon: Candee Furbish, MD;  Location: [facility] ENDOSCOPY;  Service: Cardiovascular;  Laterality: N/A;  . Pacemaker insertion  09/14/2013    MDT Adapta L implanted by Dr Lovena Le for SSS    Current Outpatient Prescriptions  Medication Sig Dispense Refill  . acetaminophen (TYLENOL) 500 MG tablet Take 500 mg by mouth every 8 (eight) hours as needed for mild pain.      Marland Kitchen albuterol (PROVENTIL HFA;VENTOLIN HFA) 108 (90 BASE) MCG/ACT inhaler Inhale 2 puffs into the lungs every 6 (six) hours as needed for wheezing.  1 Inhaler  2  . amiodarone (PACERONE) 100 MG tablet Take 1 tablet (100 mg total) by mouth 2 (two) times daily.  60 tablet  12  . apixaban (ELIQUIS) 2.5 MG TABS tablet Take 1 tablet (2.5 mg total) by mouth 2 (two) times daily.  60 tablet  4  . docusate sodium (COLACE) 100 MG capsule Take 1 capsule (100 mg total) by mouth 2 (two) times daily as needed. For constipation  30 capsule  0  . donepezil (ARICEPT) 10 MG tablet Take 1 tablet (10 mg total) by mouth at bedtime.  30 tablet  0  . ferrous sulfate 325 (65 FE) MG tablet Take 1 tablet (325 mg total) by  mouth 2 (two) times daily.  60 tablet  0  . fish oil-omega-3 fatty acids 1000 MG capsule Take 1 g by mouth daily.      Marland Kitchen FLUCONAZOLE PO Take 1 tablet by mouth daily.      . furosemide (LASIX) 80 MG tablet Take 80 mg by mouth 2 (two) times daily.       Marland Kitchen gabapentin (NEURONTIN) 600 MG tablet Take 300-600 mg by mouth See admin instructions. Take 300 tab in the morning and 600 tab at bedtime      . insulin aspart (NOVOLOG) 100 UNIT/ML injection Inject into the skin as needed for high blood sugar. Sliding scale      . insulin glargine (LANTUS) 100 UNIT/ML injection Inject 24 Units into the skin every evening.      Marland Kitchen  lactobacillus acidophilus (BACID) TABS tablet Take 1 tablet by mouth daily.       Marland Kitchen loratadine (CLARITIN) 10 MG tablet Take 10 mg by mouth daily.      . Multiple Vitamin (MULTIVITAMIN) tablet Take 1 tablet by mouth daily.       . Multiple Vitamins-Minerals (PRESERVISION AREDS PO) Take 1 capsule by mouth daily.      . potassium chloride SA (K-DUR,KLOR-CON) 20 MEQ tablet Take 20 mEq by mouth 3 (three) times daily.      Marland Kitchen RisperiDONE (RISPERDAL PO) Take by mouth.      . Sennosides (SENNA) 15 MG TABS Take 15 mg by mouth 2 (two) times daily. For constipation      . simvastatin (ZOCOR) 10 MG tablet Take 10 mg by mouth daily at 6 PM.      . spironolactone (ALDACTONE) 12.5 mg TABS tablet Take 0.5 tablets (12.5 mg total) by mouth 2 (two) times daily.  60 tablet  6   No current facility-administered medications for this visit.    Allergies:    Allergies  Allergen Reactions  . Morphine And Related Other (See Comments)    "went crazy"-Hallucinations  . Celebrex [Celecoxib] Other (See Comments)    Sore mouth  . Codeine Other (See Comments)    Altered mental status  . Oxycodone Other (See Comments)    Altered mental status  . Sulfa Antibiotics Itching    Itching all over body  . Wellbutrin [Bupropion] Other (See Comments)    Hallucinations  . Zyrtec [Cetirizine] Rash    Social History:   The patient  reports that she has never smoked. She has never used smokeless tobacco. She reports that she does not drink alcohol or use illicit drugs.   ROS:  Please see the history of present illness.   Positive for lateral lower extremity discomfort. Bilateral. No syncope, no fevers, no bleeding, no orthopnea   All other systems reviewed and negative.   PHYSICAL EXAM: VS:  BP 134/58  Pulse 70  Ht 5\' 2"  (1.575 m)  Wt 234 lb (106.142 kg)  BMI 42.79 kg/m2 Well nourished, well developed, in no acute distressIn wheelchair HEENT: normal Neck: no JVD Cardiac:  normal S1, S2; RRR; no murmur no obvious JVD Lungs:  clear to auscultation bilaterally, no wheezing, rhonchi or rales Abd: soft, nontender, no hepatomegalyobese Ext: trace+ bilateral extremity edemaMild discoloration and lateral aspects of legs bilaterally. Chronic venous insufficiency changes. Skin: warm and dry Neuro: no focal abnormalities noted  EKG:  Atrial fibrillation, left bundle branch block, 72    ASSESSMENT AND PLAN:  1. Atrial fibrillation-currently on anticoagulation which was changed to low-dose Eliquis because of hemorrhoidal bleeding.  Well controlled. Last EKG demonstrated sinus/atrial tachycardia. Continue with low-dose amiodarone for maintenance 100 BID. Unfortunately had severe bradycardia, symptomatic in the hospital setting and a pacemaker was placed in December of 2014.  2. Chronic diastolic heart failure-she is currently stable. Weight has decreased. Fluid restricted her to 2 L. I've also increased her Lasix last  to 80 mg twice a day. She has previously used Metolazone in the past.  It is been very challenging to control her over the years. She continues to drink too much fluids. Unfortunately she is had multiple readmissions. 3. Coronary artery disease-status post bypass, doing well, no exertional anginal symptoms. 4. Hypertension-currently well controlled. 5. Diabetes-per primary physician 6. Chronic venous  insufficiency-try TED hose. 7. Morbid obesity-continue to encourage weight loss. 8. Pacemaker placement-severe bradycardia. Doing well.  9.  I will see her back in 3 months. We'll likely check basic metabolic profile in 3 months. Last thing creatinine 1.1  Signed, Candee Furbish, MD [facility]  06/24/2014 2:53 PM

## 2014-06-24 NOTE — Patient Instructions (Signed)
Continue current medications as listed.  Follow up in 3 months with Dr Marlou Porch.

## 2014-07-24 ENCOUNTER — Encounter: Payer: Medicare Other | Admitting: *Deleted

## 2014-07-24 ENCOUNTER — Telehealth: Payer: Self-pay | Admitting: Cardiology

## 2014-07-24 NOTE — Telephone Encounter (Signed)
Confirmed remote transmission with pt daughter.  

## 2014-07-29 ENCOUNTER — Encounter: Payer: Self-pay | Admitting: Cardiology

## 2014-08-07 ENCOUNTER — Encounter: Payer: Self-pay | Admitting: Cardiology

## 2014-09-04 ENCOUNTER — Ambulatory Visit (INDEPENDENT_AMBULATORY_CARE_PROVIDER_SITE_OTHER): Payer: Medicare Other | Admitting: Neurology

## 2014-09-04 ENCOUNTER — Ambulatory Visit (INDEPENDENT_AMBULATORY_CARE_PROVIDER_SITE_OTHER): Payer: Self-pay | Admitting: Neurology

## 2014-09-04 DIAGNOSIS — R531 Weakness: Secondary | ICD-10-CM

## 2014-09-04 DIAGNOSIS — E1142 Type 2 diabetes mellitus with diabetic polyneuropathy: Secondary | ICD-10-CM

## 2014-09-04 NOTE — Progress Notes (Signed)
Please refer to EMG note. 

## 2014-09-04 NOTE — Procedures (Signed)
     HISTORY:  [name] is a 80 year old patient with a history of diabetes for greater than 20 years. The patient reports a 1-2 month history of bilateral hand discomfort. The patient has had several years of discomfort in both feet. The patient is being evaluated for possible neuropathy or a cervical radiculopathy.  NERVE CONDUCTION STUDIES:  Nerve conduction studies were performed on both upper extremities. The studies of the median nerves bilaterally were unobtainable. The distal motor latencies for the ulnar nerves were normal bilaterally, with a normal motor amplitude on the right, low motor amplitude on the left. The F wave latencies for the ulnar nerves were normal bilaterally, with slowing seen above the elbow on the left ulnar nerve, normal nerve conduction velocities on the right ulnar nerve above and below the elbow. The sensory latencies for the median and ulnar nerves were unobtainable bilaterally. The patient has normal radial sensory latencies bilaterally.  Nerve conduction studies were performed on the left lower extremity. No response was seen for the left peroneal nerve. The distal motor latency for the left posterior tibial nerve was normal, with a low motor amplitude. No response was seen following stimulation at the popliteal fossa. The sensory latencies for the peroneal nerves were unobtainable bilaterally.  EMG STUDIES:  EMG study was performed on the left upper extremity:  The first dorsal interosseous muscle reveals 2 to 5 K units with decreased recruitment. No fibrillations or positive waves were noted. The abductor pollicis brevis muscle reveals no voluntary motor units with no recruitment. No fibrillations or positive waves were noted. The extensor indicis proprius muscle reveals 1 to 3 K units with full recruitment. No fibrillations or positive waves were noted. The pronator teres muscle reveals 2 to 3 K units with full recruitment. No fibrillations or positive  waves were noted. The biceps muscle reveals 1 to 2 K units with full recruitment. No fibrillations or positive waves were noted. The triceps muscle reveals 2 to 4 K units with full recruitment. No fibrillations or positive waves were noted. The anterior deltoid muscle reveals 2 to 3 K units with full recruitment. No fibrillations or positive waves were noted.  A limited EMG study was performed on the right upper extremity:  The first dorsal interosseous muscle reveals 2 to 4 K units with full recruitment. No fibrillations or positive waves were noted. The abductor pollicis brevis muscle reveals no voluntary motor units with no recruitment. No fibrillations or positive waves were noted. The extensor indicis proprius muscle reveals 1 to 3 K units with full recruitment. No fibrillations or positive waves were noted.     Impression:  Nerve conduction studies done on both upper extremities and the left lower extremity shows evidence of a severe primarily axonal diabetic peripheral neuropathy. EMG evaluation of the left upper extremity shows findings consistent with a severe, end-stage carpal tunnel syndrome, with evidence of a mild tardy ulnar neuropathy. No evidence of a left cervical radiculopathy was seen. Limited EMG evaluation of the right upper extremity shows findings consistent with an end-stage carpal tunnel syndrome.

## 2014-09-05 ENCOUNTER — Encounter (HOSPITAL_COMMUNITY): Payer: Self-pay | Admitting: Internal Medicine

## 2014-09-23 ENCOUNTER — Ambulatory Visit (INDEPENDENT_AMBULATORY_CARE_PROVIDER_SITE_OTHER): Payer: Medicare Other | Admitting: Cardiology

## 2014-09-23 ENCOUNTER — Encounter: Payer: Self-pay | Admitting: Cardiology

## 2014-09-23 VITALS — BP 128/66 | HR 85 | Ht 62.0 in | Wt 248.0 lb

## 2014-09-23 DIAGNOSIS — I480 Paroxysmal atrial fibrillation: Secondary | ICD-10-CM

## 2014-09-23 DIAGNOSIS — I5032 Chronic diastolic (congestive) heart failure: Secondary | ICD-10-CM

## 2014-09-23 DIAGNOSIS — I10 Essential (primary) hypertension: Secondary | ICD-10-CM

## 2014-09-23 NOTE — Progress Notes (Signed)
[facility]. [address] [place], [place]  [address] Phone: [phone] Fax:  [phone]  Date:  09/23/2014   ID:  [name], DOB 05/04/1933, MRN [id number]  PCP:  Wenda Low, MD   History of Present Illness: [name] is a 80 y.o. female with chronic diastolic heart failure with multiple recurrent hospitalizations, normal EF, bypass with aortic valve replacement in 2009, morbid obesity, type 2 diabetes and bradycardia with pacemaker as well as paroxysmal atrial fibrillation here for followup.  06/15/13-she was taking too metolazone since previous visit. Her weight was up. She was worried about her dog with tooth abscess. [name], her daughter states that she constantly preaches to her about her fluid intake and salt intake. No knee replacement do to poor surgical candidate.  07/19/13 - Ortho with injection in knee. No SHOB. No CP. Knee a little improved. She takes metolazone extra approximately once per week. She still admits to dietary indiscretions. Her weight has been stable over the last month. No shortness of breath, no edema in lower extremities. She is weak. No chest pain.  08/20/13- unfortunately, she was back in the hospital. On 07/23/13. Her primary diagnosis was acute respiratory failure with hypoxia.-Was that this was most likely secondary to COPD exacerbation and pneumonia. Her diastolic heart failure appeared to be clinically compensated. She's had some issues with hyponatremia. She states that she did not get blood work previously by primary physician.  10/02/13-unfortunately she was hospitalized once again with primary diagnosis of GI bleed, probable hemorrhoidal bleeding. Her hemoglobin was stable. Pacemaker was placed due to severe bradycardia. Chronic diastolic heart failure was well controlled. Because of atrial fibrillation, Eliquis 2.5 mg twice a day was started. She used to be on Xarelto but this was changed to Eliquis for thoughts that this may help  reduce her overall GI bleeding risk. Her coronary artery disease status post bypass with stable.  11/05/13-no hospitalization over the past month.  12/03/13-currently her main complaint is leg pain. She sees Dr. Donne Hazel. Not operative candidate. She has increased her weight approximately 3 pounds. Some of this could be adipose tissue because of her limited mobility. She's not short of breath. No crackles.  01/14/14-she saw Dr. Laurence Spates. Injection given. Overall doing well. Leg pain seems to be improved. She continues to drink excessive amounts of tea. Counseled her on this once again. We gave her metolazone 5 mg. Lasix 80/80 for 3 days. Metolazone for one day.  03/18/14-since we last talked, she has been placed in an assisted living because of difficulty ambulating at home. Since being there, she has had paranoia, hallucinations. Exacerbation of vascular dementia likely. She has also gained weight approximally 17 pounds.  06/24/14 - bad cough. Lungs clear.   09/23/14-peripheral neuropathy bilateral feet main complaint. Tried Biofreeze. Tried Lyrica but this caused memory impairment. Increased gabapentin. Frustrated. No shortness of breath, no chest pain. She does urinate quite a bit at night.  Wt Readings from Last 3 Encounters:  09/23/14 248 lb (112.492 kg)  06/24/14 234 lb (106.142 kg)  03/18/14 247 lb 1.9 oz (112.093 kg)     Past Medical History  Diagnosis Date  . Coronary artery disease 09/04/08    single vessel CABG at time of AVR   . Aortic stenosis 09/04/08    s/p AVR by Dr Servando Snare (tissue valve)  . Paroxysmal atrial fibrillation     remote history of PAF  . LBBB (left bundle branch block)   . CHF (congestive  heart failure)     diastolic  . OSA (obstructive sleep apnea)   . Alzheimer's dementia   . Diabetes mellitus     type 2  . Hypertension   . Chronic anemia   . Osteoporosis   . Chronic renal failure   . Morbid obesity with BMI of 50.0-59.9, adult 03/09/2012  .  Peripheral vascular disease   . Arthritis   . Chronic diastolic heart failure 37/06/6268  . Chronic right ear pain     "nerve damage"  . Atrial flutter   . Anemia in chronic kidney disease   . Major depressive disorder   . Alopecia   . Benign hypertensive kidney disease with chronic kidney disease stage I through stage IV, or unspecified   . Type II or unspecified type diabetes mellitus with neurological manifestations, not stated as uncontrolled   . Pure hypercholesterolemia   . Coronary atherosclerosis of native coronary artery   . Obesity   . GI bleed 08/2013  . Sick sinus syndrome     s/p PPM    Past Surgical History  Procedure Laterality Date  . Coronary artery bypass graft  09/04/08    single vessel at time of AVR  . Aortic valve replacement  12/09    tissue valve by Dr Servando Snare  . Cholecystectomy  1999  . Cataract extraction  2002  . Replacement total knee bilateral      L 2000, R 1997  . Appendectomy    . Closed reduction femoral shaft fracture  2007    R intramedullary rod  . Tee without cardioversion N/A 02/07/2013    Procedure: TRANSESOPHAGEAL ECHOCARDIOGRAM (TEE);  Surgeon: Candee Furbish, MD;  Location: [facility] ENDOSCOPY;  Service: Cardiovascular;  Laterality: N/A;  . Pacemaker insertion  09/14/2013    MDT Adapta L implanted by Dr Lovena Le for SSS  . Permanent pacemaker insertion N/A 09/14/2013    Procedure: PERMANENT PACEMAKER INSERTION;  Surgeon: Evans Lance, MD;  Location: [facility] CATH LAB;  Service: Cardiovascular;  Laterality: N/A;    Current Outpatient Prescriptions  Medication Sig Dispense Refill  . acetaminophen (TYLENOL) 500 MG tablet Take 500 mg by mouth every 8 (eight) hours as needed for mild pain.    Marland Kitchen albuterol (PROVENTIL HFA;VENTOLIN HFA) 108 (90 BASE) MCG/ACT inhaler Inhale 2 puffs into the lungs every 6 (six) hours as needed for wheezing. 1 Inhaler 2  . amiodarone (PACERONE) 100 MG tablet Take 1 tablet (100 mg total) by mouth 2 (two) times daily. 60 tablet  12  . apixaban (ELIQUIS) 2.5 MG TABS tablet Take 1 tablet (2.5 mg total) by mouth 2 (two) times daily. 60 tablet 4  . docusate sodium (COLACE) 100 MG capsule Take 1 capsule (100 mg total) by mouth 2 (two) times daily as needed. For constipation 30 capsule 0  . donepezil (ARICEPT) 10 MG tablet Take 1 tablet (10 mg total) by mouth at bedtime. 30 tablet 0  . ferrous sulfate 325 (65 FE) MG tablet Take 1 tablet (325 mg total) by mouth 2 (two) times daily. 60 tablet 0  . fish oil-omega-3 fatty acids 1000 MG capsule Take 1 g by mouth daily.    Marland Kitchen FLUCONAZOLE PO Take 1 tablet by mouth daily.    . furosemide (LASIX) 80 MG tablet Take 80 mg by mouth 2 (two) times daily.     Marland Kitchen gabapentin (NEURONTIN) 600 MG tablet Take 600 mg by mouth 3 (three) times daily. Take 300 tab in the morning and 600 tab at bedtime    .  insulin aspart (NOVOLOG) 100 UNIT/ML injection Inject into the skin as needed for high blood sugar. Sliding scale    . insulin glargine (LANTUS) 100 UNIT/ML injection Inject 24 Units into the skin every evening.    . lactobacillus acidophilus (BACID) TABS tablet Take 1 tablet by mouth daily.     Marland Kitchen loratadine (CLARITIN) 10 MG tablet Take 10 mg by mouth daily.    . Multiple Vitamin (MULTIVITAMIN) tablet Take 1 tablet by mouth daily.     . Multiple Vitamins-Minerals (PRESERVISION AREDS PO) Take 1 capsule by mouth daily.    . potassium chloride SA (K-DUR,KLOR-CON) 20 MEQ tablet Take 20 mEq by mouth 3 (three) times daily.    Marland Kitchen RisperiDONE (RISPERDAL PO) Take by mouth.    . Sennosides (SENNA) 15 MG TABS Take 15 mg by mouth 2 (two) times daily. For constipation    . simvastatin (ZOCOR) 10 MG tablet Take 10 mg by mouth daily at 6 PM.    . spironolactone (ALDACTONE) 12.5 mg TABS tablet Take 0.5 tablets (12.5 mg total) by mouth 2 (two) times daily. 60 tablet 6  . traMADol (ULTRAM-ER) 100 MG 24 hr tablet Take 100 mg by mouth 2 (two) times daily.     No current facility-administered medications for this visit.      Allergies:    Allergies  Allergen Reactions  . Morphine And Related Other (See Comments)    "went crazy"-Hallucinations  . Celebrex [Celecoxib] Other (See Comments)    Sore mouth  . Codeine Other (See Comments)    Altered mental status  . Oxycodone Other (See Comments)    Altered mental status  . Sulfa Antibiotics Itching    Itching all over body  . Wellbutrin [Bupropion] Other (See Comments)    Hallucinations  . Zyrtec [Cetirizine] Rash    Social History:  The patient  reports that she has never smoked. She has never used smokeless tobacco. She reports that she does not drink alcohol or use illicit drugs.   ROS:  Please see the history of present illness.   Positive for lateral lower extremity discomfort. Peripheral neuropathy. Bilateral. No syncope, no fevers, no bleeding, no orthopnea   All other systems reviewed and negative.   PHYSICAL EXAM: VS:  BP 128/66 mmHg  Pulse 85  Ht 5\' 2"  (1.575 m)  Wt 248 lb (112.492 kg)  BMI 45.35 kg/m2 Well nourished, well developed, in no acute distressIn wheelchair HEENT: normal Neck: no JVD Cardiac:  normal S1, S2; RRR; no murmur no obvious JVD Lungs:  clear to auscultation bilaterally, no wheezing, rhonchi or rales Abd: soft, nontender, no hepatomegalyobese Ext: trace+ bilateral extremity edemaMild discoloration and lateral aspects of legs bilaterally. Chronic venous insufficiency changes. Skin: warm and dry Neuro: no focal abnormalities noted  EKG:  Atrial fibrillation, left bundle branch block, 72    ASSESSMENT AND PLAN:  1. Atrial fibrillation-currently on anticoagulation which was changed to low-dose Eliquis because of hemorrhoidal bleeding.  Well controlled. Last EKG demonstrated sinus/atrial tachycardia. Continue with low-dose amiodarone for maintenance 100 BID. Unfortunately had severe bradycardia, symptomatic in the hospital setting and a pacemaker was placed in December of 2014.  2. Chronic diastolic heart failure-she is  currently stable. Fluid restricted her to 2 L. I've also increased her Lasix last  to 80 mg twice a day. She has previously used Metolazone in the past.  It is been very challenging to control her over the years. She continues to drink too much fluids. Unfortunately she is had  multiple readmissions. She has been fairly stable since being in rehabilitation facility. I have asked them to let her take her Lasix in the late afternoon instead of evening to help her stay of the bathroom at night. 3. Coronary artery disease-status post bypass, doing well, no exertional anginal symptoms. 4. Hypertension-currently well controlled. 5. Diabetes-per primary physician 6. Chronic venous insufficiency-try TED hose. 7. Morbid obesity-continue to encourage weight loss. 8. Pacemaker placement-severe bradycardia. Doing well.  9. I will see her back in 3 months. Last time creatinine 1.1.   Signed, Candee Furbish, MD [facility]  09/23/2014 4:44 PM

## 2014-09-23 NOTE — Patient Instructions (Signed)
The current medical regimen is effective;  continue present plan and medications.  Please give second dose of Lasix in the afternoon instead of the evening.  Please check a basic metabolic profile and fax results to Dr Marlou Porch at [phone].  Follow up in 3 months with Dr. Marlou Porch.  You will receive a letter in the mail 2 months before you are due.  Please call us when you receive this letter to schedule your follow up appointment.

## 2015-01-28 ENCOUNTER — Encounter: Payer: Self-pay | Admitting: *Deleted

## 2015-02-28 ENCOUNTER — Encounter: Payer: Self-pay | Admitting: *Deleted

## 2015-03-31 IMAGING — US US ABDOMEN COMPLETE
1 series · 14 of 25 positions shown · non-contrast
Comparison: None.

CLINICAL DATA: Abnormal liver function tests.

COMPLETE ABDOMINAL ULTRASOUND

[Series 1: us abdomen complete · 0.35mm/px · 14 of 79 slices shown]
[im 1/79]
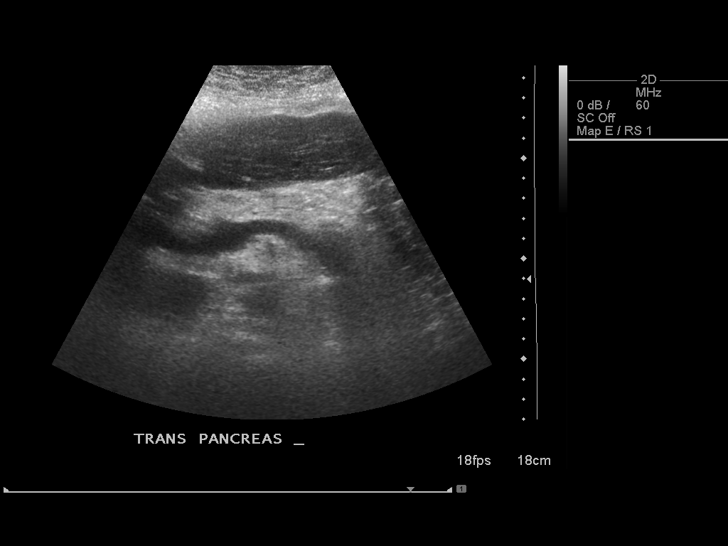
[im 7/79]
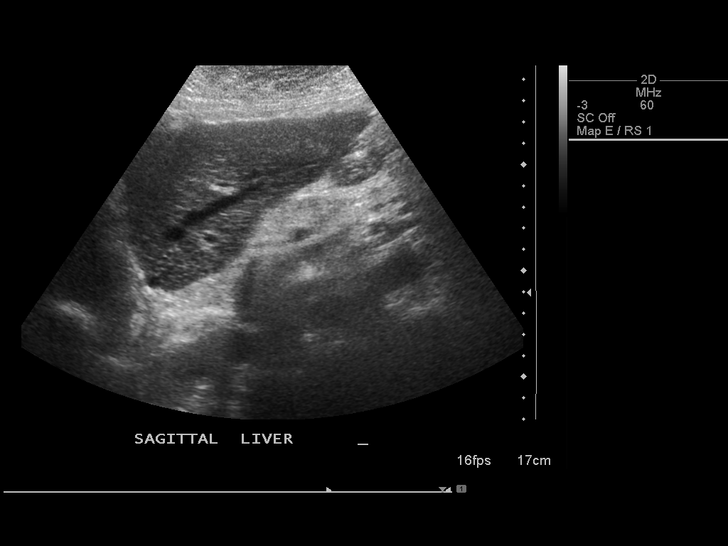
[im 14/79]
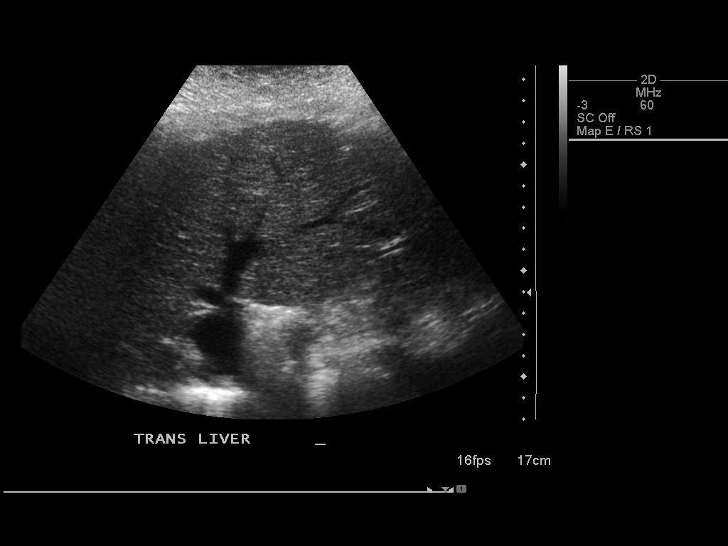
[im 20/79]
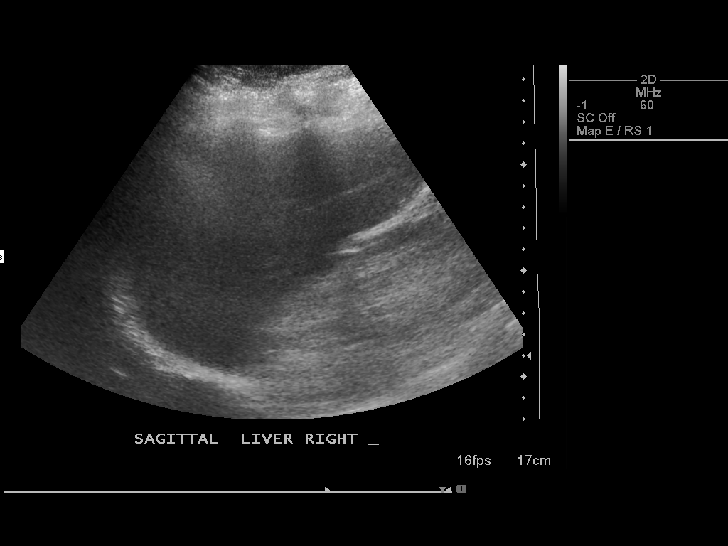
[im 27/79]
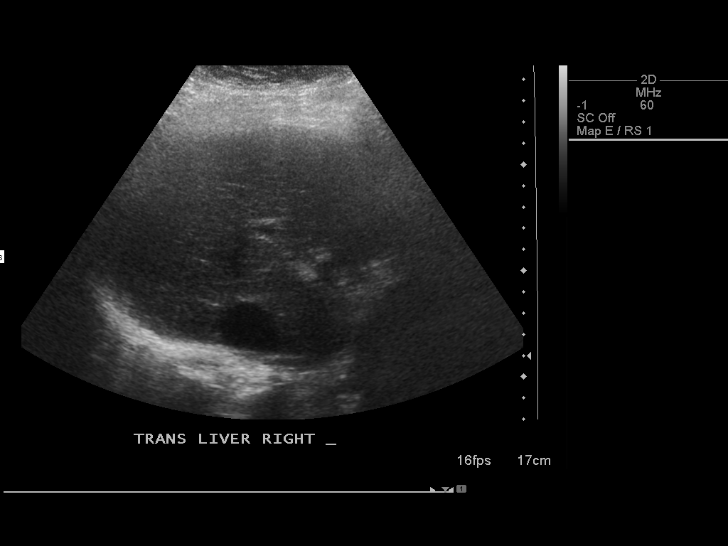
[im 30/79]
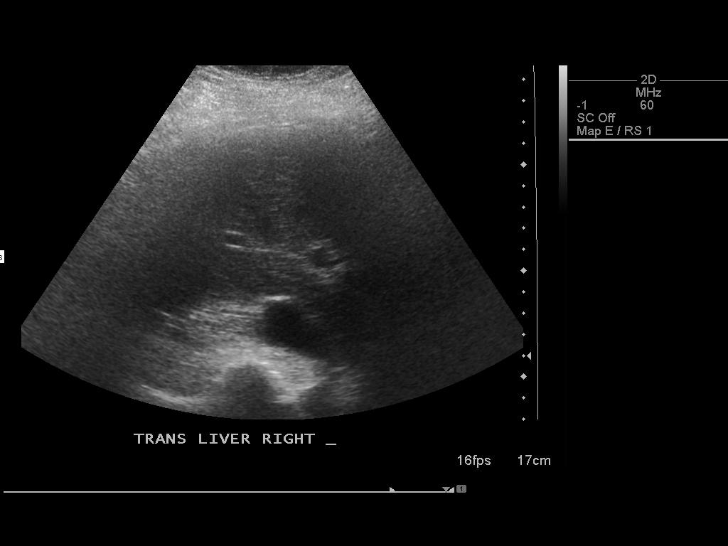
[im 36/79]
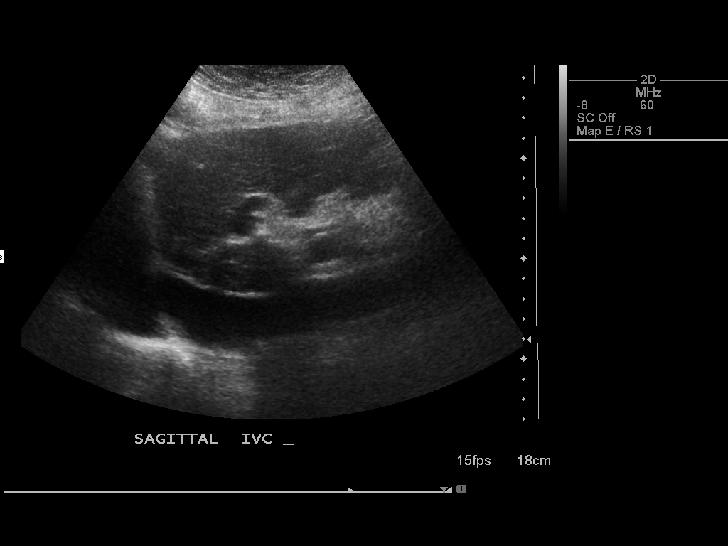
[im 43/79]
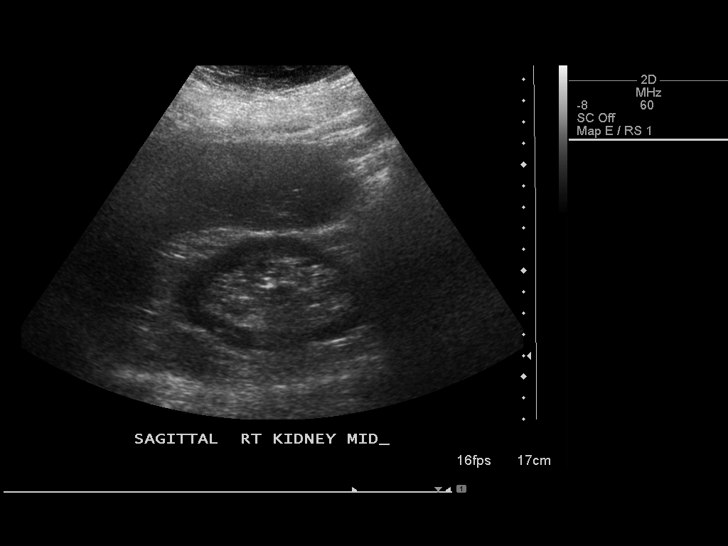
[im 49/79]
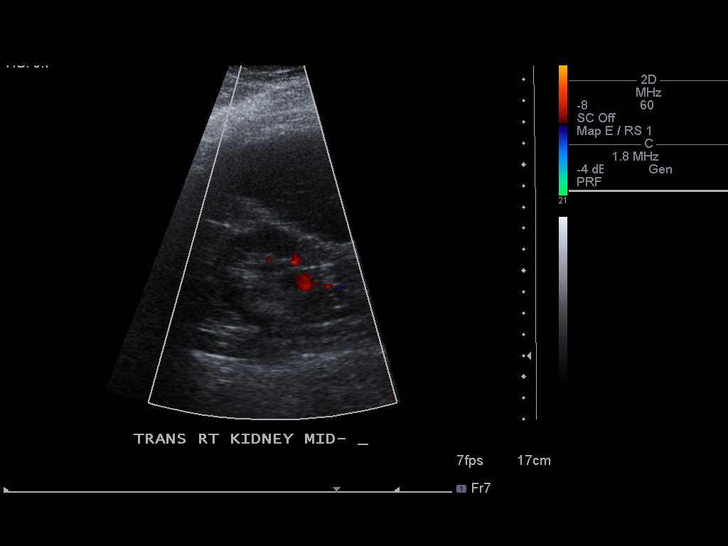
[im 53/79]
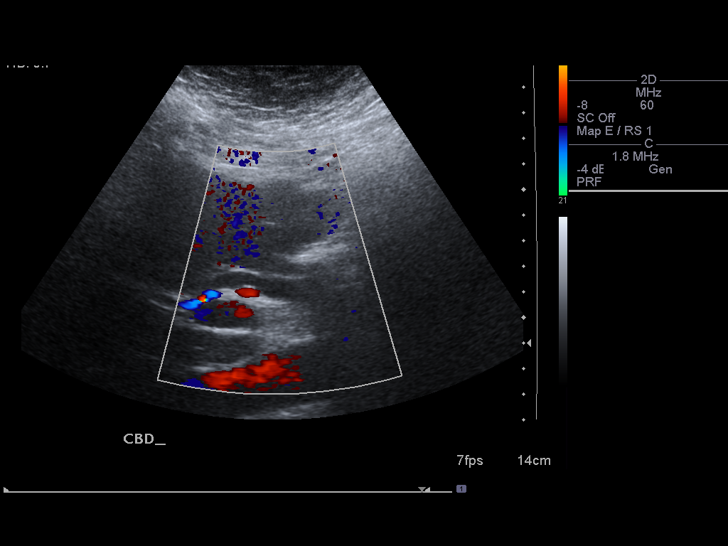
[im 59/79]
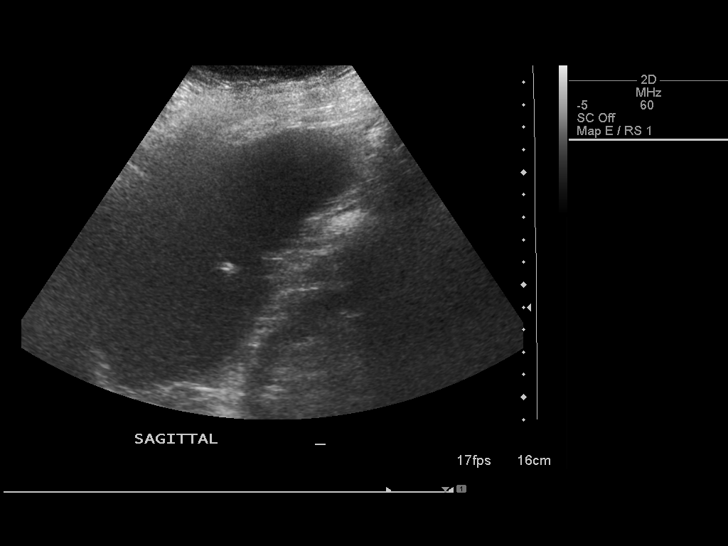
[im 66/79]
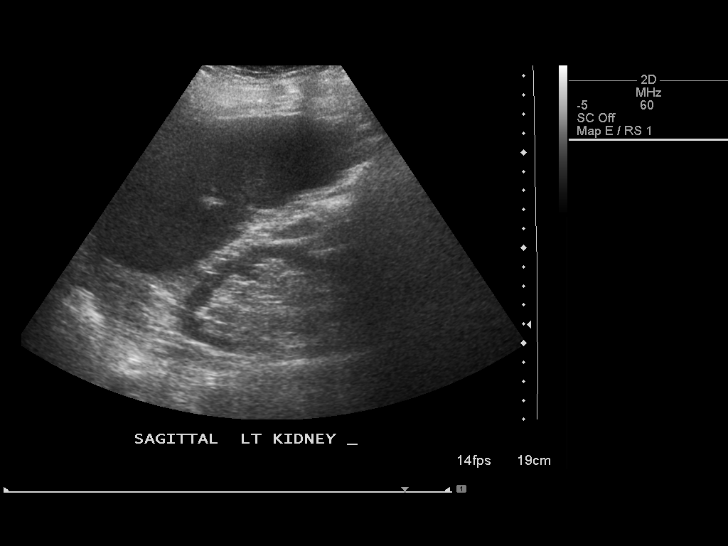
[im 72/79]
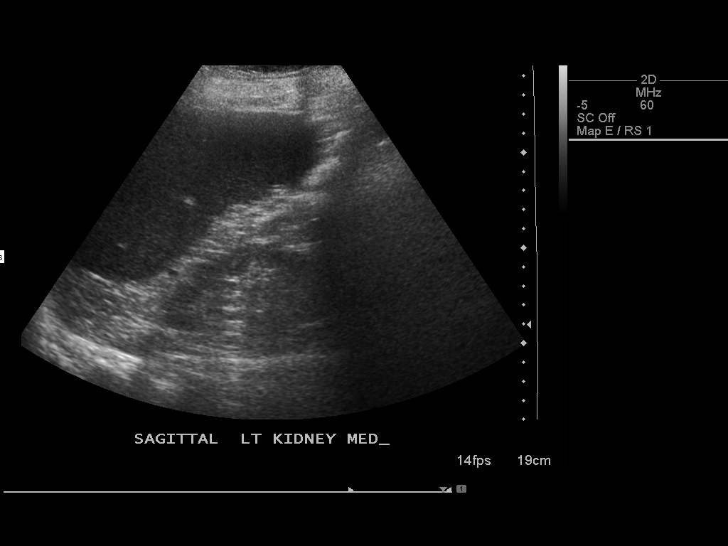
[im 79/79]
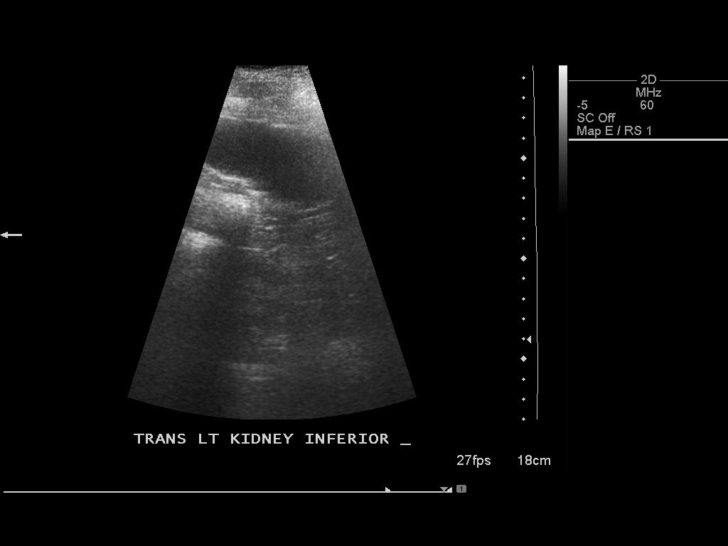

[14 of 25 positions shown; findings below may reference images not displayed]

FINDINGS: Gallbladder:  Removed.

Common bile duct:  Measures 0.8 cm.

Liver:  No focal lesion identified.  Within normal limits in
parenchymal echogenicity.

IVC:  Appears normal.

Pancreas:  No focal abnormality seen.

Spleen:  Measures 10.0 cm and appears normal.

Right Kidney:  Measures 9.1 cm and demonstrate some cortical
thinning.

Left Kidney:  Measures 9.1 cm and demonstrate some cortical
thinning.

Abdominal aorta:  Negative for aneurysm.
IMPRESSION: No acute finding or abnormality to explain elevated liver function
tests.  Status post cholecystectomy.

## 2015-04-16 ENCOUNTER — Encounter (INDEPENDENT_AMBULATORY_CARE_PROVIDER_SITE_OTHER): Payer: Medicare Other | Admitting: *Deleted

## 2015-04-16 ENCOUNTER — Encounter: Payer: Self-pay | Admitting: Internal Medicine

## 2015-04-16 DIAGNOSIS — R001 Bradycardia, unspecified: Secondary | ICD-10-CM | POA: Diagnosis not present

## 2015-04-16 LAB — CUP PACEART INCLINIC DEVICE CHECK
Battery Impedance: 110 Ohm
Battery Remaining Longevity: 143 mo
Battery Voltage: 2.79 V
Brady Statistic AP VS Percent: 95 %
Brady Statistic AS VP Percent: 0 %
Brady Statistic AS VS Percent: 1 %
Date Time Interrogation Session: 20160720170129
Lead Channel Pacing Threshold Amplitude: 0.75 V
Lead Channel Pacing Threshold Amplitude: 0.75 V
Lead Channel Pacing Threshold Pulse Width: 0.4 ms
Lead Channel Setting Pacing Amplitude: 2 V
MDC IDC MSMT LEADCHNL RA IMPEDANCE VALUE: 546 Ohm
MDC IDC MSMT LEADCHNL RA PACING THRESHOLD PULSEWIDTH: 0.4 ms
MDC IDC MSMT LEADCHNL RA SENSING INTR AMPL: 2 mV
MDC IDC MSMT LEADCHNL RV IMPEDANCE VALUE: 584 Ohm
MDC IDC MSMT LEADCHNL RV SENSING INTR AMPL: 8 mV
MDC IDC SET LEADCHNL RV PACING AMPLITUDE: 2.5 V
MDC IDC SET LEADCHNL RV PACING PULSEWIDTH: 0.4 ms
MDC IDC SET LEADCHNL RV SENSING SENSITIVITY: 2.8 mV
MDC IDC STAT BRADY AP VP PERCENT: 3 %

## 2015-10-09 ENCOUNTER — Encounter: Payer: Self-pay | Admitting: *Deleted

## 2015-10-20 ENCOUNTER — Encounter: Payer: Self-pay | Admitting: Internal Medicine

## 2015-10-20 ENCOUNTER — Ambulatory Visit (INDEPENDENT_AMBULATORY_CARE_PROVIDER_SITE_OTHER): Payer: Medicare Other | Admitting: Internal Medicine

## 2015-10-20 VITALS — BP 132/68 | HR 72 | Ht 62.5 in

## 2015-10-20 DIAGNOSIS — I10 Essential (primary) hypertension: Secondary | ICD-10-CM

## 2015-10-20 DIAGNOSIS — I480 Paroxysmal atrial fibrillation: Secondary | ICD-10-CM | POA: Diagnosis not present

## 2015-10-20 DIAGNOSIS — I495 Sick sinus syndrome: Secondary | ICD-10-CM | POA: Diagnosis not present

## 2015-10-20 LAB — CUP PACEART INCLINIC DEVICE CHECK
Battery Impedance: 100 Ohm
Brady Statistic AP VP Percent: 6 %
Brady Statistic AP VS Percent: 94 %
Brady Statistic AS VP Percent: 0 %
Brady Statistic AS VS Percent: 0 %
Implantable Lead Implant Date: 20141219
Implantable Lead Location: 753859
Implantable Lead Location: 753860
Implantable Lead Model: 5076
Lead Channel Impedance Value: 529 Ohm
Lead Channel Impedance Value: 584 Ohm
Lead Channel Pacing Threshold Pulse Width: 0.4 ms
Lead Channel Pacing Threshold Pulse Width: 0.4 ms
Lead Channel Sensing Intrinsic Amplitude: 8 mV
Lead Channel Setting Pacing Amplitude: 2 V
Lead Channel Setting Sensing Sensitivity: 2.8 mV
MDC IDC LEAD IMPLANT DT: 20141219
MDC IDC MSMT BATTERY REMAINING LONGEVITY: 144 mo
MDC IDC MSMT BATTERY VOLTAGE: 2.79 V
MDC IDC MSMT LEADCHNL RA PACING THRESHOLD AMPLITUDE: 0.75 V
MDC IDC MSMT LEADCHNL RV PACING THRESHOLD AMPLITUDE: 0.75 V
MDC IDC SESS DTM: 20170123155055
MDC IDC SET LEADCHNL RV PACING AMPLITUDE: 2.5 V
MDC IDC SET LEADCHNL RV PACING PULSEWIDTH: 0.4 ms

## 2015-10-20 LAB — BASIC METABOLIC PANEL
BUN: 26 mg/dL — ABNORMAL HIGH (ref 7–25)
CO2: 29 mmol/L (ref 20–31)
Calcium: 8.8 mg/dL (ref 8.6–10.4)
Chloride: 100 mmol/L (ref 98–110)
Creat: 1.22 mg/dL — ABNORMAL HIGH (ref 0.60–0.88)
Glucose, Bld: 244 mg/dL — ABNORMAL HIGH (ref 65–99)
POTASSIUM: 4.4 mmol/L (ref 3.5–5.3)
SODIUM: 138 mmol/L (ref 135–146)

## 2015-10-20 LAB — CBC WITH DIFFERENTIAL/PLATELET
BASOS ABS: 0.1 10*3/uL (ref 0.0–0.1)
Basophils Relative: 1 % (ref 0–1)
EOS PCT: 2 % (ref 0–5)
Eosinophils Absolute: 0.1 10*3/uL (ref 0.0–0.7)
HEMATOCRIT: 39.2 % (ref 36.0–46.0)
Hemoglobin: 12.9 g/dL (ref 12.0–15.0)
LYMPHS PCT: 25 % (ref 12–46)
Lymphs Abs: 1.4 10*3/uL (ref 0.7–4.0)
MCH: 29.9 pg (ref 26.0–34.0)
MCHC: 32.9 g/dL (ref 30.0–36.0)
MCV: 91 fL (ref 78.0–100.0)
MPV: 10.8 fL (ref 8.6–12.4)
Monocytes Absolute: 0.6 10*3/uL (ref 0.1–1.0)
Monocytes Relative: 11 % (ref 3–12)
NEUTROS ABS: 3.4 10*3/uL (ref 1.7–7.7)
Neutrophils Relative %: 61 % (ref 43–77)
Platelets: 112 10*3/uL — ABNORMAL LOW (ref 150–400)
RBC: 4.31 MIL/uL (ref 3.87–5.11)
RDW: 13.9 % (ref 11.5–15.5)
WBC: 5.5 10*3/uL (ref 4.0–10.5)

## 2015-10-20 LAB — HEPATIC FUNCTION PANEL
ALT: 12 U/L (ref 6–29)
AST: 21 U/L (ref 10–35)
Albumin: 3.6 g/dL (ref 3.6–5.1)
Alkaline Phosphatase: 94 U/L (ref 33–130)
BILIRUBIN DIRECT: 0.2 mg/dL (ref <=0.2)
BILIRUBIN INDIRECT: 0.6 mg/dL (ref 0.2–1.2)
Total Bilirubin: 0.8 mg/dL (ref 0.2–1.2)
Total Protein: 6.2 g/dL (ref 6.1–8.1)

## 2015-10-20 NOTE — Patient Instructions (Addendum)
Medication Instructions:  Your physician recommends that you continue on your current medications as directed. Please refer to the Current Medication list given to you today.   Labwork: Your physician recommends that you return for lab work today: BMP/cbc/tsh/t4/liver    Testing/Procedures: None ordered   Follow-Up:  Your physician wants you to follow-up in: 12 months with Chanetta Marshall, NPYou will receive a reminder letter in the mail two months in advance. If you don't receive a letter, please call our office to schedule the follow-up appointment.   Remote monitoring is used to monitor your Pacemaker  from home. This monitoring reduces the number of office visits required to check your device to one time per year. It allows [place] to keep an eye on the functioning of your device to ensure it is working properly. You are scheduled for a device check from home on 01/19/16. You may send your transmission at any time that day. If you have a wireless device, the transmission will be sent automatically. After your physician reviews your transmission, you will receive a postcard with your next transmission date.    Any Other Special Instructions Will Be Listed Below (If Applicable).     If you need a refill on your cardiac medications before your next appointment, please call your pharmacy.

## 2015-10-20 NOTE — Progress Notes (Signed)
PCP: Wenda Low, MD Primary Cardiologist:  Dr Florence Canner is a 82 y.o. female who presents today for routine electrophysiology followup.  Since her last visit, the patient reports doing reasonably well.  Her primary concern is with bilateral knee pain and neuropathy.  She is not very active.  Today, she denies symptoms of palpitations, chest pain, shortness of breath,  lower extremity edema, dizziness, presyncope, or syncope.  The patient is otherwise without complaint today.   Past Medical History  Diagnosis Date  . Coronary artery disease 09/04/08    single vessel CABG at time of AVR   . Aortic stenosis 09/04/08    s/p AVR by Dr Servando Snare (tissue valve)  . Paroxysmal atrial fibrillation (HCC)     remote history of PAF  . LBBB (left bundle branch block)   . CHF (congestive heart failure) (HCC)     diastolic  . OSA (obstructive sleep apnea)   . Alzheimer's dementia   . Diabetes mellitus     type 2  . Hypertension   . Chronic anemia   . Osteoporosis   . Chronic renal failure   . Morbid obesity with BMI of 50.0-59.9, adult ([place]) 03/09/2012  . Peripheral vascular disease ([place])   . Arthritis   . Chronic diastolic heart failure ([place]) 07/20/2013  . Chronic right ear pain     "nerve damage"  . Atrial flutter ([place])   . Anemia in chronic kidney disease   . Major depressive disorder ([place])   . Alopecia   . Benign hypertensive kidney disease with chronic kidney disease stage I through stage IV, or unspecified   . Type II or unspecified type diabetes mellitus with neurological manifestations, not stated as uncontrolled   . Pure hypercholesterolemia   . Coronary atherosclerosis of native coronary artery   . Obesity   . GI bleed 08/2013  . Sick sinus syndrome ([place])     s/p PPM   Past Surgical History  Procedure Laterality Date  . Coronary artery bypass graft  09/04/08    single vessel at time of AVR  . Aortic valve replacement  12/09    tissue valve by Dr Servando Snare  .  Cholecystectomy  1999  . Cataract extraction  2002  . Replacement total knee bilateral      L 2000, R 1997  . Appendectomy    . Closed reduction femoral shaft fracture  2007    R intramedullary rod  . Tee without cardioversion N/A 02/07/2013    Procedure: TRANSESOPHAGEAL ECHOCARDIOGRAM (TEE);  Surgeon: Candee Furbish, MD;  Location: [facility] ENDOSCOPY;  Service: Cardiovascular;  Laterality: N/A;  . Pacemaker insertion  09/14/2013    MDT Adapta L implanted by Dr Lovena Le for SSS  . Permanent pacemaker insertion N/A 09/14/2013    Procedure: PERMANENT PACEMAKER INSERTION;  Surgeon: Evans Lance, MD;  Location: [facility] CATH LAB;  Service: Cardiovascular;  Laterality: N/A;    Current Outpatient Prescriptions  Medication Sig Dispense Refill  . acetaminophen (TYLENOL) 500 MG tablet Take 500 mg by mouth every 8 (eight) hours as needed for mild pain.    Marland Kitchen albuterol (PROVENTIL HFA;VENTOLIN HFA) 108 (90 BASE) MCG/ACT inhaler Inhale 2 puffs into the lungs every 6 (six) hours as needed for wheezing. 1 Inhaler 2  . amiodarone (PACERONE) 200 MG tablet Take 100 mg by mouth 2 (two) times daily.    Marland Kitchen amoxicillin (AMOXIL) 500 MG tablet Take 2,000 mg by mouth as directed. Take 1 hour before dental procedures    .  apixaban (ELIQUIS) 2.5 MG TABS tablet Take 1 tablet (2.5 mg total) by mouth 2 (two) times daily. 60 tablet 4  . cetirizine (ZYRTEC) 10 MG tablet Take 10 mg by mouth daily.    Marland Kitchen docusate sodium (COLACE) 100 MG capsule Take 100 mg by mouth 2 (two) times daily.    Marland Kitchen donepezil (ARICEPT) 10 MG tablet Take 1 tablet (10 mg total) by mouth at bedtime. 30 tablet 0  . doxycycline (VIBRAMYCIN) 100 MG capsule Take 100 mg by mouth 2 (two) times daily.    . ferrous sulfate 325 (65 FE) MG tablet Take 1 tablet (325 mg total) by mouth 2 (two) times daily. 60 tablet 0  . fish oil-omega-3 fatty acids 1000 MG capsule Take 1 g by mouth daily.    . furosemide (LASIX) 80 MG tablet Take 80 mg by mouth 2 (two) times daily.     Marland Kitchen  gabapentin (NEURONTIN) 600 MG tablet Take 600 mg by mouth 3 (three) times daily.     Marland Kitchen guaiFENesin (MUCINEX) 600 MG 12 hr tablet Take 600 mg by mouth daily.    . insulin aspart (NOVOLOG) 100 UNIT/ML injection Inject into the skin as needed for high blood sugar. Sliding scale    . insulin detemir (LEVEMIR) 100 UNIT/ML injection Inject 24 Units into the skin at bedtime.    . lactobacillus (FLORANEX/LACTINEX) PACK Take 1 g by mouth daily.    . Multiple Vitamin (MULTIVITAMIN) tablet Take 1 tablet by mouth daily.     . Multiple Vitamins-Minerals (PRESERVISION AREDS PO) Take 1 capsule by mouth daily.    . potassium chloride SA (K-DUR,KLOR-CON) 20 MEQ tablet Take 20 mEq by mouth 3 (three) times daily.    . Sennosides (SENNA) 15 MG TABS Take 15 mg by mouth 2 (two) times daily. For constipation    . simvastatin (ZOCOR) 10 MG tablet Take 10 mg by mouth daily at 6 PM.    . spironolactone (ALDACTONE) 12.5 mg TABS tablet Take 0.5 tablets (12.5 mg total) by mouth 2 (two) times daily. 60 tablet 6  . traMADol (ULTRAM-ER) 100 MG 24 hr tablet Take 200 mg by mouth 2 (two) times daily.      No current facility-administered medications for this visit.    Physical Exam: Filed Vitals:   10/20/15 1425  BP: 132/68  Pulse: 72  Height: 5' 2.5" (1.588 m)    GEN- The patient is elderly appearing, alert and oriented x 3 today.   Head- normocephalic, atraumatic Eyes-  Sclera clear, conjunctiva pink Ears- hearing reduced Oropharynx- clear Lungs- Clear to ausculation bilaterally, normal work of breathing Chest- pacemaker pocket is well healed Heart- Regular rate and rhythm, no murmurs, rubs or gallops, PMI not laterally displaced GI- soft, NT, ND, + BS Extremities- no clubbing, cyanosis, or edema  Pacemaker interrogation- reviewed in detail today,  See PACEART report  Assessment and Plan:  1. Sinus bradycardia Normal pacemaker function See Pace Art report No changes today  2. Atrial  fibrillation chads2vasc score is at least 6. She is anticoagulated with eliquis 2.5mg  BID Cbc, bmet today Check lfts, tfts on amiodarone  3. htn Stable No change required today  4. Cad No ischemic symptoms  [employer] (daughter to enroll in [facility]) Return to see EP NP in the device clinic in 1 year Follow-up with Dr Marlou Porch as schedule  Thompson Grayer MD, [facility] 10/20/2015 3:01 PM

## 2015-10-21 LAB — TSH: TSH: 3.503 u[IU]/mL (ref 0.350–4.500)

## 2015-10-21 LAB — T4, FREE: FREE T4: 1.15 ng/dL (ref 0.80–1.80)

## 2016-01-19 ENCOUNTER — Telehealth: Payer: Self-pay | Admitting: Cardiology

## 2016-01-19 ENCOUNTER — Encounter: Payer: Medicare Other | Admitting: *Deleted

## 2016-01-19 NOTE — Telephone Encounter (Signed)
Confirmed remote transmission w/ pt daughter.   

## 2016-01-23 ENCOUNTER — Encounter: Payer: Self-pay | Admitting: Cardiology

## 2016-03-04 ENCOUNTER — Encounter (HOSPITAL_COMMUNITY): Payer: Self-pay

## 2016-03-04 ENCOUNTER — Inpatient Hospital Stay (HOSPITAL_COMMUNITY)
Admission: EM | Admit: 2016-03-04 | Discharge: 2016-03-27 | DRG: 813 | Disposition: E | Payer: Medicare Other | Attending: Internal Medicine | Admitting: Internal Medicine

## 2016-03-04 ENCOUNTER — Emergency Department (HOSPITAL_COMMUNITY): Payer: Medicare Other

## 2016-03-04 DIAGNOSIS — G4733 Obstructive sleep apnea (adult) (pediatric): Secondary | ICD-10-CM | POA: Diagnosis present

## 2016-03-04 DIAGNOSIS — Z951 Presence of aortocoronary bypass graft: Secondary | ICD-10-CM | POA: Diagnosis not present

## 2016-03-04 DIAGNOSIS — Z9049 Acquired absence of other specified parts of digestive tract: Secondary | ICD-10-CM

## 2016-03-04 DIAGNOSIS — F0280 Dementia in other diseases classified elsewhere without behavioral disturbance: Secondary | ICD-10-CM | POA: Diagnosis present

## 2016-03-04 DIAGNOSIS — I472 Ventricular tachycardia: Secondary | ICD-10-CM

## 2016-03-04 DIAGNOSIS — Z954 Presence of other heart-valve replacement: Secondary | ICD-10-CM | POA: Diagnosis not present

## 2016-03-04 DIAGNOSIS — Z8249 Family history of ischemic heart disease and other diseases of the circulatory system: Secondary | ICD-10-CM

## 2016-03-04 DIAGNOSIS — Z800 Family history of malignant neoplasm of digestive organs: Secondary | ICD-10-CM

## 2016-03-04 DIAGNOSIS — M810 Age-related osteoporosis without current pathological fracture: Secondary | ICD-10-CM | POA: Diagnosis present

## 2016-03-04 DIAGNOSIS — D631 Anemia in chronic kidney disease: Secondary | ICD-10-CM | POA: Diagnosis present

## 2016-03-04 DIAGNOSIS — Z950 Presence of cardiac pacemaker: Secondary | ICD-10-CM | POA: Diagnosis not present

## 2016-03-04 DIAGNOSIS — E875 Hyperkalemia: Secondary | ICD-10-CM | POA: Diagnosis present

## 2016-03-04 DIAGNOSIS — Z952 Presence of prosthetic heart valve: Secondary | ICD-10-CM

## 2016-03-04 DIAGNOSIS — Z7189 Other specified counseling: Secondary | ICD-10-CM | POA: Insufficient documentation

## 2016-03-04 DIAGNOSIS — F329 Major depressive disorder, single episode, unspecified: Secondary | ICD-10-CM | POA: Diagnosis present

## 2016-03-04 DIAGNOSIS — Z885 Allergy status to narcotic agent status: Secondary | ICD-10-CM | POA: Diagnosis not present

## 2016-03-04 DIAGNOSIS — Z6841 Body Mass Index (BMI) 40.0 and over, adult: Secondary | ICD-10-CM

## 2016-03-04 DIAGNOSIS — I872 Venous insufficiency (chronic) (peripheral): Secondary | ICD-10-CM | POA: Diagnosis present

## 2016-03-04 DIAGNOSIS — I4892 Unspecified atrial flutter: Secondary | ICD-10-CM | POA: Diagnosis present

## 2016-03-04 DIAGNOSIS — R54 Age-related physical debility: Secondary | ICD-10-CM | POA: Diagnosis present

## 2016-03-04 DIAGNOSIS — D72829 Elevated white blood cell count, unspecified: Secondary | ICD-10-CM | POA: Diagnosis present

## 2016-03-04 DIAGNOSIS — I509 Heart failure, unspecified: Secondary | ICD-10-CM

## 2016-03-04 DIAGNOSIS — Z66 Do not resuscitate: Secondary | ICD-10-CM | POA: Diagnosis not present

## 2016-03-04 DIAGNOSIS — I447 Left bundle-branch block, unspecified: Secondary | ICD-10-CM | POA: Diagnosis present

## 2016-03-04 DIAGNOSIS — N170 Acute kidney failure with tubular necrosis: Secondary | ICD-10-CM | POA: Diagnosis present

## 2016-03-04 DIAGNOSIS — Z96653 Presence of artificial knee joint, bilateral: Secondary | ICD-10-CM | POA: Diagnosis present

## 2016-03-04 DIAGNOSIS — S37012A Minor contusion of left kidney, initial encounter: Secondary | ICD-10-CM | POA: Diagnosis present

## 2016-03-04 DIAGNOSIS — I495 Sick sinus syndrome: Secondary | ICD-10-CM | POA: Diagnosis present

## 2016-03-04 DIAGNOSIS — I130 Hypertensive heart and chronic kidney disease with heart failure and stage 1 through stage 4 chronic kidney disease, or unspecified chronic kidney disease: Secondary | ICD-10-CM | POA: Diagnosis present

## 2016-03-04 DIAGNOSIS — Z794 Long term (current) use of insulin: Secondary | ICD-10-CM

## 2016-03-04 DIAGNOSIS — Z882 Allergy status to sulfonamides status: Secondary | ICD-10-CM

## 2016-03-04 DIAGNOSIS — I959 Hypotension, unspecified: Secondary | ICD-10-CM | POA: Diagnosis not present

## 2016-03-04 DIAGNOSIS — S37019A Minor contusion of unspecified kidney, initial encounter: Secondary | ICD-10-CM | POA: Diagnosis present

## 2016-03-04 DIAGNOSIS — I480 Paroxysmal atrial fibrillation: Secondary | ICD-10-CM | POA: Diagnosis present

## 2016-03-04 DIAGNOSIS — I10 Essential (primary) hypertension: Secondary | ICD-10-CM | POA: Diagnosis not present

## 2016-03-04 DIAGNOSIS — Z820 Family history of epilepsy and other diseases of the nervous system: Secondary | ICD-10-CM

## 2016-03-04 DIAGNOSIS — E1149 Type 2 diabetes mellitus with other diabetic neurological complication: Secondary | ICD-10-CM | POA: Diagnosis present

## 2016-03-04 DIAGNOSIS — Z833 Family history of diabetes mellitus: Secondary | ICD-10-CM

## 2016-03-04 DIAGNOSIS — I482 Chronic atrial fibrillation: Secondary | ICD-10-CM

## 2016-03-04 DIAGNOSIS — Z79899 Other long term (current) drug therapy: Secondary | ICD-10-CM

## 2016-03-04 DIAGNOSIS — D62 Acute posthemorrhagic anemia: Secondary | ICD-10-CM | POA: Diagnosis present

## 2016-03-04 DIAGNOSIS — K7460 Unspecified cirrhosis of liver: Secondary | ICD-10-CM | POA: Diagnosis present

## 2016-03-04 DIAGNOSIS — Z953 Presence of xenogenic heart valve: Secondary | ICD-10-CM

## 2016-03-04 DIAGNOSIS — I5042 Chronic combined systolic (congestive) and diastolic (congestive) heart failure: Secondary | ICD-10-CM | POA: Diagnosis present

## 2016-03-04 DIAGNOSIS — D688 Other specified coagulation defects: Secondary | ICD-10-CM | POA: Diagnosis not present

## 2016-03-04 DIAGNOSIS — R571 Hypovolemic shock: Secondary | ICD-10-CM | POA: Diagnosis present

## 2016-03-04 DIAGNOSIS — L659 Nonscarring hair loss, unspecified: Secondary | ICD-10-CM | POA: Diagnosis present

## 2016-03-04 DIAGNOSIS — E1122 Type 2 diabetes mellitus with diabetic chronic kidney disease: Secondary | ICD-10-CM | POA: Diagnosis present

## 2016-03-04 DIAGNOSIS — E7800 Pure hypercholesterolemia, unspecified: Secondary | ICD-10-CM | POA: Diagnosis present

## 2016-03-04 DIAGNOSIS — Z7901 Long term (current) use of anticoagulants: Secondary | ICD-10-CM

## 2016-03-04 DIAGNOSIS — R32 Unspecified urinary incontinence: Secondary | ICD-10-CM | POA: Diagnosis present

## 2016-03-04 DIAGNOSIS — X58XXXA Exposure to other specified factors, initial encounter: Secondary | ICD-10-CM | POA: Diagnosis present

## 2016-03-04 DIAGNOSIS — I248 Other forms of acute ischemic heart disease: Secondary | ICD-10-CM | POA: Diagnosis present

## 2016-03-04 DIAGNOSIS — G309 Alzheimer's disease, unspecified: Secondary | ICD-10-CM | POA: Diagnosis present

## 2016-03-04 DIAGNOSIS — Z801 Family history of malignant neoplasm of trachea, bronchus and lung: Secondary | ICD-10-CM

## 2016-03-04 DIAGNOSIS — I5032 Chronic diastolic (congestive) heart failure: Secondary | ICD-10-CM | POA: Diagnosis present

## 2016-03-04 DIAGNOSIS — R34 Anuria and oliguria: Secondary | ICD-10-CM | POA: Diagnosis present

## 2016-03-04 DIAGNOSIS — R000 Tachycardia, unspecified: Secondary | ICD-10-CM

## 2016-03-04 DIAGNOSIS — N183 Chronic kidney disease, stage 3 (moderate): Secondary | ICD-10-CM | POA: Diagnosis present

## 2016-03-04 DIAGNOSIS — N179 Acute kidney failure, unspecified: Secondary | ICD-10-CM | POA: Diagnosis present

## 2016-03-04 DIAGNOSIS — E1151 Type 2 diabetes mellitus with diabetic peripheral angiopathy without gangrene: Secondary | ICD-10-CM | POA: Diagnosis present

## 2016-03-04 DIAGNOSIS — Z888 Allergy status to other drugs, medicaments and biological substances status: Secondary | ICD-10-CM | POA: Diagnosis not present

## 2016-03-04 DIAGNOSIS — K7200 Acute and subacute hepatic failure without coma: Secondary | ICD-10-CM | POA: Diagnosis present

## 2016-03-04 DIAGNOSIS — I2510 Atherosclerotic heart disease of native coronary artery without angina pectoris: Secondary | ICD-10-CM | POA: Diagnosis present

## 2016-03-04 DIAGNOSIS — M545 Low back pain: Secondary | ICD-10-CM | POA: Diagnosis not present

## 2016-03-04 DIAGNOSIS — N2889 Other specified disorders of kidney and ureter: Secondary | ICD-10-CM | POA: Diagnosis not present

## 2016-03-04 DIAGNOSIS — Z515 Encounter for palliative care: Secondary | ICD-10-CM | POA: Insufficient documentation

## 2016-03-04 DIAGNOSIS — E119 Type 2 diabetes mellitus without complications: Secondary | ICD-10-CM

## 2016-03-04 DIAGNOSIS — R9431 Abnormal electrocardiogram [ECG] [EKG]: Secondary | ICD-10-CM | POA: Diagnosis not present

## 2016-03-04 LAB — COMPREHENSIVE METABOLIC PANEL
ALBUMIN: 3.5 g/dL (ref 3.5–5.0)
ALT: 16 U/L (ref 14–54)
ANION GAP: 9 (ref 5–15)
AST: 26 U/L (ref 15–41)
Alkaline Phosphatase: 90 U/L (ref 38–126)
BILIRUBIN TOTAL: 0.8 mg/dL (ref 0.3–1.2)
BUN: 30 mg/dL — ABNORMAL HIGH (ref 6–20)
CHLORIDE: 101 mmol/L (ref 101–111)
CO2: 25 mmol/L (ref 22–32)
Calcium: 8.7 mg/dL — ABNORMAL LOW (ref 8.9–10.3)
Creatinine, Ser: 1.59 mg/dL — ABNORMAL HIGH (ref 0.44–1.00)
GFR calc Af Amer: 34 mL/min — ABNORMAL LOW (ref >60)
GFR, EST NON AFRICAN AMERICAN: 30 mL/min — AB (ref >60)
Glucose, Bld: 330 mg/dL — ABNORMAL HIGH (ref 65–99)
POTASSIUM: 4.4 mmol/L (ref 3.5–5.1)
Sodium: 135 mmol/L (ref 135–145)
TOTAL PROTEIN: 6.7 g/dL (ref 6.5–8.1)

## 2016-03-04 LAB — TYPE AND SCREEN
ABO/RH(D): O NEG
ANTIBODY SCREEN: NEGATIVE

## 2016-03-04 LAB — CBC WITH DIFFERENTIAL/PLATELET
Basophils Absolute: 0 10*3/uL (ref 0.0–0.1)
Basophils Relative: 0 %
Eosinophils Absolute: 0.1 10*3/uL (ref 0.0–0.7)
Eosinophils Relative: 0 %
HEMATOCRIT: 35.6 % — AB (ref 36.0–46.0)
Hemoglobin: 11.9 g/dL — ABNORMAL LOW (ref 12.0–15.0)
LYMPHS PCT: 9 %
Lymphs Abs: 1.7 10*3/uL (ref 0.7–4.0)
MCH: 29.5 pg (ref 26.0–34.0)
MCHC: 33.4 g/dL (ref 30.0–36.0)
MCV: 88.3 fL (ref 78.0–100.0)
Monocytes Absolute: 1.5 10*3/uL — ABNORMAL HIGH (ref 0.1–1.0)
Monocytes Relative: 8 %
Neutro Abs: 15.3 10*3/uL — ABNORMAL HIGH (ref 1.7–7.7)
Neutrophils Relative %: 83 %
PLATELETS: 120 10*3/uL — AB (ref 150–400)
RBC: 4.03 MIL/uL (ref 3.87–5.11)
RDW: 14.4 % (ref 11.5–15.5)
WBC: 18.5 10*3/uL — AB (ref 4.0–10.5)

## 2016-03-04 LAB — RENAL FUNCTION PANEL
Albumin: 3.4 g/dL — ABNORMAL LOW (ref 3.5–5.0)
Anion gap: 16 — ABNORMAL HIGH (ref 5–15)
BUN: 39 mg/dL — ABNORMAL HIGH (ref 6–20)
CO2: 18 mmol/L — ABNORMAL LOW (ref 22–32)
Calcium: 8.5 mg/dL — ABNORMAL LOW (ref 8.9–10.3)
Chloride: 99 mmol/L — ABNORMAL LOW (ref 101–111)
Creatinine, Ser: 2.63 mg/dL — ABNORMAL HIGH (ref 0.44–1.00)
GFR calc Af Amer: 19 mL/min — ABNORMAL LOW (ref >60)
GFR calc non Af Amer: 16 mL/min — ABNORMAL LOW (ref >60)
Glucose, Bld: 464 mg/dL — ABNORMAL HIGH (ref 65–99)
Phosphorus: 6.9 mg/dL — ABNORMAL HIGH (ref 2.5–4.6)
Potassium: 5.9 mmol/L — ABNORMAL HIGH (ref 3.5–5.1)
Sodium: 133 mmol/L — ABNORMAL LOW (ref 135–145)

## 2016-03-04 LAB — HEMOGLOBIN AND HEMATOCRIT, BLOOD
HCT: 33.9 % — ABNORMAL LOW (ref 36.0–46.0)
HCT: 32 % — ABNORMAL LOW (ref 36.0–46.0)
Hemoglobin: 11 g/dL — ABNORMAL LOW (ref 12.0–15.0)
Hemoglobin: 10.3 g/dL — ABNORMAL LOW (ref 12.0–15.0)

## 2016-03-04 LAB — URINALYSIS, ROUTINE W REFLEX MICROSCOPIC
Bilirubin Urine: NEGATIVE
Glucose, UA: 250 mg/dL — AB
Hgb urine dipstick: NEGATIVE
Ketones, ur: NEGATIVE mg/dL
Leukocytes, UA: NEGATIVE
NITRITE: NEGATIVE
PROTEIN: NEGATIVE mg/dL
Specific Gravity, Urine: 1.02 (ref 1.005–1.030)
pH: 5.5 (ref 5.0–8.0)

## 2016-03-04 LAB — MRSA PCR SCREENING: MRSA by PCR: NEGATIVE

## 2016-03-04 LAB — I-STAT CG4 LACTIC ACID, ED: Lactic Acid, Venous: 2.44 mmol/L (ref 0.5–2.0)

## 2016-03-04 LAB — TROPONIN I: Troponin I: 0.05 ng/mL — ABNORMAL HIGH (ref <0.031)

## 2016-03-04 LAB — GLUCOSE, CAPILLARY
Glucose-Capillary: 400 mg/dL — ABNORMAL HIGH (ref 65–99)
Glucose-Capillary: 388 mg/dL — ABNORMAL HIGH (ref 65–99)
Glucose-Capillary: 402 mg/dL — ABNORMAL HIGH (ref 65–99)
Glucose-Capillary: 407 mg/dL — ABNORMAL HIGH (ref 65–99)

## 2016-03-04 LAB — LIPASE, BLOOD: LIPASE: 32 U/L (ref 11–51)

## 2016-03-04 LAB — MAGNESIUM: Magnesium: 2.2 mg/dL (ref 1.7–2.4)

## 2016-03-04 MED ORDER — AMIODARONE HCL IN DEXTROSE 360-4.14 MG/200ML-% IV SOLN
60.0000 mg/h | INTRAVENOUS | Status: AC
  Administered 2016-03-04 (×2): 60 mg/h via INTRAVENOUS
  Filled 2016-03-04 (×2): qty 200

## 2016-03-04 MED ORDER — AMIODARONE HCL IN DEXTROSE 360-4.14 MG/200ML-% IV SOLN
30.0000 mg/h | INTRAVENOUS | Status: DC
  Filled 2016-03-04: qty 200

## 2016-03-04 MED ORDER — FENTANYL 25 MCG/HR TD PT72
25.0000 ug | MEDICATED_PATCH | TRANSDERMAL | Status: DC
  Administered 2016-03-04: 25 ug via TRANSDERMAL
  Filled 2016-03-04: qty 1

## 2016-03-04 MED ORDER — FENTANYL CITRATE (PF) 100 MCG/2ML IJ SOLN
50.0000 ug | Freq: Once | INTRAMUSCULAR | Status: AC
  Administered 2016-03-04: 50 ug via INTRAVENOUS
  Filled 2016-03-04: qty 2

## 2016-03-04 MED ORDER — SODIUM CHLORIDE 0.9 % IV BOLUS (SEPSIS): 250.0000 mL | Freq: Once | INTRAVENOUS | Status: DC

## 2016-03-04 MED ORDER — DONEPEZIL HCL 10 MG PO TABS
10.0000 mg | ORAL_TABLET | Freq: Every day | ORAL | Status: DC
  Administered 2016-03-04: 10 mg via ORAL
  Filled 2016-03-04: qty 1

## 2016-03-04 MED ORDER — DOCUSATE SODIUM 100 MG PO CAPS
100.0000 mg | ORAL_CAPSULE | Freq: Two times a day (BID) | ORAL | Status: DC
  Administered 2016-03-04: 100 mg via ORAL
  Filled 2016-03-04 (×2): qty 1

## 2016-03-04 MED ORDER — INSULIN ASPART 100 UNIT/ML ~~LOC~~ SOLN: 2.0000 [IU] | Freq: Three times a day (TID) | SUBCUTANEOUS | Status: DC

## 2016-03-04 MED ORDER — FLORANEX PO PACK
1.0000 g | PACK | Freq: Every day | ORAL | Status: DC
  Filled 2016-03-04 (×2): qty 1

## 2016-03-04 MED ORDER — ONDANSETRON HCL 4 MG/2ML IJ SOLN
4.0000 mg | Freq: Once | INTRAMUSCULAR | Status: AC
  Administered 2016-03-04: 4 mg via INTRAVENOUS
  Filled 2016-03-04: qty 2

## 2016-03-04 MED ORDER — AMIODARONE HCL 200 MG PO TABS
100.0000 mg | ORAL_TABLET | Freq: Two times a day (BID) | ORAL | Status: DC
  Administered 2016-03-04: 100 mg via ORAL
  Filled 2016-03-04: qty 1

## 2016-03-04 MED ORDER — FENTANYL CITRATE (PF) 100 MCG/2ML IJ SOLN
25.0000 ug | Freq: Four times a day (QID) | INTRAMUSCULAR | Status: DC | PRN
  Administered 2016-03-04: 25 ug via INTRAVENOUS
  Filled 2016-03-04: qty 2

## 2016-03-04 MED ORDER — SODIUM CHLORIDE 0.9 % IV BOLUS (SEPSIS)
500.0000 mL | Freq: Once | INTRAVENOUS | Status: AC
  Administered 2016-03-04: 500 mL via INTRAVENOUS

## 2016-03-04 MED ORDER — GABAPENTIN 300 MG PO CAPS
600.0000 mg | ORAL_CAPSULE | Freq: Three times a day (TID) | ORAL | Status: DC
  Administered 2016-03-04 (×2): 600 mg via ORAL
  Filled 2016-03-04 (×2): qty 2

## 2016-03-04 MED ORDER — DULOXETINE HCL 30 MG PO CPEP
60.0000 mg | ORAL_CAPSULE | Freq: Every day | ORAL | Status: DC
  Administered 2016-03-04: 60 mg via ORAL
  Filled 2016-03-04: qty 2

## 2016-03-04 MED ORDER — INSULIN ASPART 100 UNIT/ML ~~LOC~~ SOLN
6.0000 [IU] | Freq: Once | SUBCUTANEOUS | Status: AC
  Administered 2016-03-04: 6 [IU] via SUBCUTANEOUS

## 2016-03-04 MED ORDER — MELATONIN 5 MG PO TABS: 5.0000 mg | ORAL_TABLET | Freq: Every evening | ORAL | Status: DC | PRN

## 2016-03-04 MED ORDER — INSULIN ASPART 100 UNIT/ML ~~LOC~~ SOLN: 0.0000 [IU] | Freq: Every day | SUBCUTANEOUS | Status: DC

## 2016-03-04 MED ORDER — VITAMIN D 1000 UNITS PO TABS
2000.0000 [IU] | ORAL_TABLET | Freq: Every day | ORAL | Status: DC
  Administered 2016-03-04: 2000 [IU] via ORAL
  Filled 2016-03-04: qty 2

## 2016-03-04 MED ORDER — SODIUM CHLORIDE 0.9 % IV BOLUS (SEPSIS)
250.0000 mL | Freq: Once | INTRAVENOUS | Status: AC
  Administered 2016-03-04: 250 mL via INTRAVENOUS

## 2016-03-04 MED ORDER — ALBUTEROL SULFATE (2.5 MG/3ML) 0.083% IN NEBU
3.0000 mL | INHALATION_SOLUTION | Freq: Four times a day (QID) | RESPIRATORY_TRACT | Status: DC | PRN
  Administered 2016-03-05: 3 mL via RESPIRATORY_TRACT
  Filled 2016-03-04: qty 3

## 2016-03-04 MED ORDER — MAGNESIUM SULFATE 2 GM/50ML IV SOLN: 2.0000 g | Freq: Once | INTRAVENOUS | Status: DC

## 2016-03-04 MED ORDER — SPIRONOLACTONE 25 MG PO TABS
12.5000 mg | ORAL_TABLET | Freq: Two times a day (BID) | ORAL | Status: DC
Start: 2016-03-04 — End: 2016-03-05
  Filled 2016-03-04: qty 1

## 2016-03-04 MED ORDER — GUAIFENESIN ER 600 MG PO TB12
600.0000 mg | ORAL_TABLET | Freq: Every day | ORAL | Status: DC
  Administered 2016-03-04: 600 mg via ORAL
  Filled 2016-03-04: qty 1

## 2016-03-04 MED ORDER — OMEGA-3-ACID ETHYL ESTERS 1 G PO CAPS
1.0000 g | ORAL_CAPSULE | Freq: Every day | ORAL | Status: DC
  Administered 2016-03-04: 1 g via ORAL
  Filled 2016-03-04 (×2): qty 1

## 2016-03-04 MED ORDER — ADULT MULTIVITAMIN W/MINERALS CH
1.0000 | ORAL_TABLET | Freq: Every day | ORAL | Status: DC
  Administered 2016-03-04: 1 via ORAL
  Filled 2016-03-04: qty 1

## 2016-03-04 MED ORDER — LORATADINE 10 MG PO TABS
10.0000 mg | ORAL_TABLET | Freq: Every day | ORAL | Status: DC
  Administered 2016-03-04: 10 mg via ORAL
  Filled 2016-03-04: qty 1

## 2016-03-04 MED ORDER — SIMVASTATIN 10 MG PO TABS
10.0000 mg | ORAL_TABLET | Freq: Every day | ORAL | Status: DC
  Filled 2016-03-04: qty 1

## 2016-03-04 MED ORDER — ACETAMINOPHEN 500 MG PO TABS: 500.0000 mg | ORAL_TABLET | Freq: Three times a day (TID) | ORAL | Status: DC | PRN

## 2016-03-04 MED ORDER — INSULIN ASPART 100 UNIT/ML ~~LOC~~ SOLN
0.0000 [IU] | Freq: Three times a day (TID) | SUBCUTANEOUS | Status: DC
  Administered 2016-03-04 (×2): 15 [IU] via SUBCUTANEOUS
  Administered 2016-03-05: 8 [IU] via SUBCUTANEOUS

## 2016-03-04 MED ORDER — SENNA 8.6 MG PO TABS
2.0000 | ORAL_TABLET | Freq: Every day | ORAL | Status: DC
  Administered 2016-03-04: 17.2 mg via ORAL
  Filled 2016-03-04: qty 2

## 2016-03-04 MED ORDER — INSULIN DETEMIR 100 UNIT/ML ~~LOC~~ SOLN: 24.0000 [IU] | Freq: Every day | SUBCUTANEOUS | Status: DC

## 2016-03-04 MED ORDER — FERROUS SULFATE 325 (65 FE) MG PO TABS
325.0000 mg | ORAL_TABLET | Freq: Two times a day (BID) | ORAL | Status: DC
  Filled 2016-03-04: qty 1

## 2016-03-04 MED ORDER — FENTANYL CITRATE (PF) 100 MCG/2ML IJ SOLN
25.0000 ug | Freq: Once | INTRAMUSCULAR | Status: AC
  Administered 2016-03-04: 25 ug via INTRAVENOUS
  Filled 2016-03-04: qty 2

## 2016-03-04 MED ORDER — AMIODARONE LOAD VIA INFUSION
150.0000 mg | Freq: Once | INTRAVENOUS | Status: AC
  Administered 2016-03-04: 150 mg via INTRAVENOUS
  Filled 2016-03-04: qty 83.34

## 2016-03-04 NOTE — Consult Note (Signed)
Urology Consult   Physician requesting consult: Dr. Dina Rich  Reason for consult: Left perirenal hematoma  History of Present Illness: [name] is a 82 y.o. who is chronically anticoagulated with Eliquis for atrial fibrillation.  She developed the acute onset of severe left flank and abdominal pain last evening at 9 PM that occurred spontaneously without any episode of trauma.  She has denied associated fever, nausea, and vomiting.  She has no history of kidney stones.  She has not had hematuria.  CT imaging without contrast (due to her CKD) demonstrated a large perirenal hematoma.  No obvious underlying renal mass could be appreciated understanding the limitations of this non contrast study.   Past Medical History  Diagnosis Date  . Coronary artery disease 09/04/08    single vessel CABG at time of AVR   . Aortic stenosis 09/04/08    s/p AVR by Dr Servando Snare (tissue valve)  . Paroxysmal atrial fibrillation (HCC)     remote history of PAF  . LBBB (left bundle branch block)   . CHF (congestive heart failure) (HCC)     diastolic  . OSA (obstructive sleep apnea)   . Alzheimer's dementia   . Diabetes mellitus     type 2  . Hypertension   . Chronic anemia   . Osteoporosis   . Chronic renal failure   . Morbid obesity with BMI of 50.0-59.9, adult ([place]) 03/09/2012  . Peripheral vascular disease ([place])   . Arthritis   . Chronic diastolic heart failure ([place]) 07/20/2013  . Chronic right ear pain     "nerve damage"  . Atrial flutter ([place])   . Anemia in chronic kidney disease   . Major depressive disorder ([place])   . Alopecia   . Benign hypertensive kidney disease with chronic kidney disease stage I through stage IV, or unspecified   . Type II or unspecified type diabetes mellitus with neurological manifestations, not stated as uncontrolled   . Pure hypercholesterolemia   . Coronary atherosclerosis of native coronary artery   . Obesity   . GI bleed 08/2013  . Sick sinus syndrome (HCC)      s/p PPM   Alopecia related to medication reaction in the past.   Past Surgical History  Procedure Laterality Date  . Coronary artery bypass graft  09/04/08    single vessel at time of AVR  . Aortic valve replacement  12/09    tissue valve by Dr Servando Snare  . Cholecystectomy  1999  . Cataract extraction  2002  . Replacement total knee bilateral      L 2000, R 1997  . Appendectomy    . Closed reduction femoral shaft fracture  2007    R intramedullary rod  . Tee without cardioversion N/A 02/07/2013    Procedure: TRANSESOPHAGEAL ECHOCARDIOGRAM (TEE);  Surgeon: Candee Furbish, MD;  Location: [facility] ENDOSCOPY;  Service: Cardiovascular;  Laterality: N/A;  . Pacemaker insertion  09/14/2013    MDT Adapta L implanted by Dr Lovena Le for SSS  . Permanent pacemaker insertion N/A 09/14/2013    Procedure: PERMANENT PACEMAKER INSERTION;  Surgeon: Evans Lance, MD;  Location: [facility] CATH LAB;  Service: Cardiovascular;  Laterality: N/A;     Current Hospital Medications:  Home meds:    Medication List    ASK your doctor about these medications        acetaminophen 500 MG tablet  Commonly known as:  TYLENOL  Take 500 mg by mouth every 8 (eight) hours as needed for mild  pain.     albuterol 108 (90 Base) MCG/ACT inhaler  Commonly known as:  PROVENTIL HFA;VENTOLIN HFA  Inhale 2 puffs into the lungs every 6 (six) hours as needed for wheezing.     amiodarone 200 MG tablet  Commonly known as:  PACERONE  Take 100 mg by mouth 2 (two) times daily.     amoxicillin 500 MG tablet  Commonly known as:  AMOXIL  Take 2,000 mg by mouth as directed. Take 1 hour before dental procedures     ELIQUIS 5 MG Tabs tablet  Generic drug:  apixaban  Take 5 mg by mouth 2 (two) times daily.     apixaban 2.5 MG Tabs tablet  Commonly known as:  ELIQUIS  Take 1 tablet (2.5 mg total) by mouth 2 (two) times daily.     cetirizine 10 MG tablet  Commonly known as:  ZYRTEC  Take 10 mg by mouth daily.     docusate sodium 100  MG capsule  Commonly known as:  COLACE  Take 100 mg by mouth 2 (two) times daily.     donepezil 10 MG tablet  Commonly known as:  ARICEPT  Take 1 tablet (10 mg total) by mouth at bedtime.     DULoxetine 60 MG capsule  Commonly known as:  CYMBALTA  Take 60 mg by mouth daily.     ferrous sulfate 325 (65 FE) MG tablet  Take 1 tablet (325 mg total) by mouth 2 (two) times daily.     fish oil-omega-3 fatty acids 1000 MG capsule  Take 1 g by mouth daily.     furosemide 80 MG tablet  Commonly known as:  LASIX  Take 80 mg by mouth 2 (two) times daily.     gabapentin 600 MG tablet  Commonly known as:  NEURONTIN  Take 600 mg by mouth 3 (three) times daily.     guaiFENesin 600 MG 12 hr tablet  Commonly known as:  MUCINEX  Take 600 mg by mouth daily.     insulin aspart 100 UNIT/ML injection  Commonly known as:  novoLOG  Inject 2-9 Units into the skin 3 (three) times daily with meals. Sliding scale 151-200=2  201-250=3 251-300=5 301-350=7 >350=9     insulin detemir 100 UNIT/ML injection  Commonly known as:  LEVEMIR  Inject 24 Units into the skin at bedtime.     lactobacillus Pack  Take 1 g by mouth daily.     Melatonin 5 MG Tabs  Take 5 mg by mouth at bedtime as needed (insomnia).     multivitamin tablet  Take 1 tablet by mouth daily.     potassium chloride SA 20 MEQ tablet  Commonly known as:  K-DUR,KLOR-CON  Take 20 mEq by mouth 3 (three) times daily.     PRESCRIPTION MEDICATION  Apply 1 application topically at bedtime. Capsacin Cream- to both feet.     senna 8.6 MG tablet  Commonly known as:  SENOKOT  Take 2 tablets by mouth daily.     simvastatin 10 MG tablet  Commonly known as:  ZOCOR  Take 10 mg by mouth daily at 6 PM.     spironolactone 12.5 mg Tabs tablet  Commonly known as:  ALDACTONE  Take 0.5 tablets (12.5 mg total) by mouth 2 (two) times daily.     traMADol 100 MG 24 hr tablet  Commonly known as:  ULTRAM-ER  Take 100 mg by mouth at bedtime as needed.      Vitamin D (Cholecalciferol)  1000 units Tabs  Take 2,000 Units by mouth daily.        Scheduled Meds: Continuous Infusions: PRN Meds:.  Allergies:  Allergies  Allergen Reactions  . Morphine And Related Other (See Comments)    "went crazy"-Hallucinations  . Celebrex [Celecoxib] Other (See Comments)    Sore mouth  . Codeine Other (See Comments)    Altered mental status  . Oxycodone Other (See Comments)    Altered mental status  . Sulfa Antibiotics Itching    Itching all over body  . Wellbutrin [Bupropion] Other (See Comments)    Hallucinations    Family History  Problem Relation Age of Onset  . Heart attack Father 54    multiple MIs before death  . Diabetes Father   . CAD Father   . Lung cancer Mother 64  . Colon cancer Mother   . Hypertension Mother   . Hypertension Sister   . Multiple sclerosis Brother   . Diabetes Sister     Social History:  reports that she has never smoked. She has never used smokeless tobacco. She reports that she does not drink alcohol or use illicit drugs.  ROS: A complete review of systems was performed.  All systems are negative except for pertinent findings as noted.  Physical Exam:  Vital signs in last 24 hours: Temp:  [97.4 F (36.3 C)] 97.4 F (36.3 C) (06/08 0429) Pulse Rate:  [58-72] 62 (06/08 0700) Resp:  [15-20] 15 (06/08 0700) BP: (126-146)/(59-103) 133/63 mmHg (06/08 0700) SpO2:  [91 %-99 %] 97 % (06/08 0700) Constitutional:  Alert and oriented, No acute distress, alopecia Cardiovascular: Regular rate and rhythm, No JVD Respiratory: Normal respiratory effort, Lungs clear bilaterally GI: Abdomen is soft, obese, nondistended, no abdominal masses. She has moderate left abdominal pain GU: Left CVA tenderness Lymphatic: No lymphadenopathy Neurologic: Grossly intact, no focal deficits Psychiatric: Normal mood and affect  Laboratory Data:   Recent Labs  02/29/2016 0459  WBC 18.5*  HGB 11.9*  HCT 35.6*  PLT 120*      Recent Labs  03/16/2016 0459  NA 135  K 4.4  CL 101  GLUCOSE 330*  BUN 30*  CALCIUM 8.7*  CREATININE 1.59*     Results for orders placed or performed during the hospital encounter of 03/19/2016 (from the past 24 hour(s))  CBC with Differential     Status: Abnormal   Collection Time: 03/09/2016  4:59 AM  Result Value Ref Range   WBC 18.5 (H) 4.0 - 10.5 K/uL   RBC 4.03 3.87 - 5.11 MIL/uL   Hemoglobin 11.9 (L) 12.0 - 15.0 g/dL   HCT 35.6 (L) 36.0 - 46.0 %   MCV 88.3 78.0 - 100.0 fL   MCH 29.5 26.0 - 34.0 pg   MCHC 33.4 30.0 - 36.0 g/dL   RDW 14.4 11.5 - 15.5 %   Platelets 120 (L) 150 - 400 K/uL   Neutrophils Relative % 83 %   Neutro Abs 15.3 (H) 1.7 - 7.7 K/uL   Lymphocytes Relative 9 %   Lymphs Abs 1.7 0.7 - 4.0 K/uL   Monocytes Relative 8 %   Monocytes Absolute 1.5 (H) 0.1 - 1.0 K/uL   Eosinophils Relative 0 %   Eosinophils Absolute 0.1 0.0 - 0.7 K/uL   Basophils Relative 0 %   Basophils Absolute 0.0 0.0 - 0.1 K/uL  Comprehensive metabolic panel     Status: Abnormal   Collection Time: 02/29/2016  4:59 AM  Result Value Ref Range  Sodium 135 135 - 145 mmol/L   Potassium 4.4 3.5 - 5.1 mmol/L   Chloride 101 101 - 111 mmol/L   CO2 25 22 - 32 mmol/L   Glucose, Bld 330 (H) 65 - 99 mg/dL   BUN 30 (H) 6 - 20 mg/dL   Creatinine, Ser 1.59 (H) 0.44 - 1.00 mg/dL   Calcium 8.7 (L) 8.9 - 10.3 mg/dL   Total Protein 6.7 6.5 - 8.1 g/dL   Albumin 3.5 3.5 - 5.0 g/dL   AST 26 15 - 41 U/L   ALT 16 14 - 54 U/L   Alkaline Phosphatase 90 38 - 126 U/L   Total Bilirubin 0.8 0.3 - 1.2 mg/dL   GFR calc non Af Amer 30 (L) >60 mL/min   GFR calc Af Amer 34 (L) >60 mL/min   Anion gap 9 5 - 15  Lipase, blood     Status: None   Collection Time: 03/14/2016  4:59 AM  Result Value Ref Range   Lipase 32 11 - 51 U/L  I-Stat CG4 Lactic Acid, ED     Status: Abnormal   Collection Time: 03/21/2016  5:17 AM  Result Value Ref Range   Lactic Acid, Venous 2.44 (HH) 0.5 - 2.0 mmol/L   Comment NOTIFIED  PHYSICIAN   Urinalysis, Routine w reflex microscopic (not at [facility])     Status: Abnormal   Collection Time: 03/03/2016  5:36 AM  Result Value Ref Range   Color, Urine YELLOW YELLOW   APPearance CLEAR CLEAR   Specific Gravity, Urine 1.020 1.005 - 1.030   pH 5.5 5.0 - 8.0   Glucose, UA 250 (A) NEGATIVE mg/dL   Hgb urine dipstick NEGATIVE NEGATIVE   Bilirubin Urine NEGATIVE NEGATIVE   Ketones, ur NEGATIVE NEGATIVE mg/dL   Protein, ur NEGATIVE NEGATIVE mg/dL   Nitrite NEGATIVE NEGATIVE   Leukocytes, UA NEGATIVE NEGATIVE  Type and screen [place]     Status: None (Preliminary result)   Collection Time: 02/27/2016  6:49 AM  Result Value Ref Range   ABO/RH(D) O NEG    Antibody Screen PENDING    Sample Expiration 03/07/2016    No results found for this or any previous visit (from the past 240 hour(s)).  Renal Function:  Recent Labs  03/14/2016 0459  CREATININE 1.59*   CrCl cannot be calculated (Unknown ideal weight.).  Radiologic Imaging: Dg Chest Portable 1 View  02/26/2016  CLINICAL DATA:  Shortness of breath EXAM: PORTABLE CHEST 1 VIEW COMPARISON:  02/12/2014 FINDINGS: Chronic cardiopericardial enlargement. Dual-chamber pacer leads from the left are in stable position. Status post median sternotomy for aortic valve replacement. Prominent mitral valve calcification. Fracturing of upper sternal wires is chronic. Interstitial coarsening above previous baseline. No effusion or pneumothorax. Stable appearance of the hila. IMPRESSION: 1. COPD with bronchitic markings accentuated by low volumes and atelectasis. 2. Chronic cardiomegaly. Electronically Signed   By: Monte Fantasia M.D.   On: 03/08/2016 06:22   Ct Renal Stone Study  03/04/2016  CLINICAL DATA:  Acute onset of back pain radiating to the abdomen. EXAM: CT ABDOMEN AND PELVIS WITHOUT CONTRAST TECHNIQUE: Multidetector CT imaging of the abdomen and pelvis was performed following the standard protocol without IV contrast.  COMPARISON:  Pelvis CT 02/13/2014 FINDINGS: Lower chest and abdominal wall: Bronchial wall thickening seen at the bases. Trace left effusion. Hepatobiliary: Cirrhotic liver without gross mass lesion. Cholecystectomy. Pancreas: Atrophy without acute finding. Spleen: Small volume fluid around the spleen, likely reactive to the neighboring  hemorrhage. Adrenals/Urinary Tract:  Negative adrenals. Large subcapsular hematoma compressing the left renal cortex. The hematoma encompasses the entire kidney and measures up to 5 cm in thickness. There is extension into the perinephric spaces dissecting into the left pelvis, sigmoid mesentery and splenorenal ligament. When allowing for clot layering, no definitive mass lesion is seen. No hydronephrosis. Atrophic bilateral kidney. Partially collapsed bladder. Reproductive:No pathologic findings. Stomach/Bowel: No obstruction. Appendectomy. Distal colonic diverticulosis. Vascular/Lymphatic: Extensive atherosclerosis. Flat IVC which may be from intravascular depletion. No mass or adenopathy. Peritoneal: No ascites or pneumoperitoneum. Musculoskeletal: Osteopenia and extensive spondylosis and disc degeneration. No acute finding. Critical Value/emergent results were called by telephone at the time of interpretation on 03/20/2016 at 6:42 am to Dr. Thayer Jew , who verbally acknowledged these results. IMPRESSION: 1. Large left subcapsular and perinephric hematoma dissecting into the retroperitoneal ligaments and left pelvis. In the absence of trauma, this could be spontaneous or from obscured renal mass. 2. Intravascular depletion with collapsed IVC. 3. Cirrhosis. Electronically Signed   By: Monte Fantasia M.D.   On: 03/22/2016 06:50    I independently reviewed the above imaging studies.  Impression/Recommendation: Left perirenal hematoma: Admit for observation, serial Hgb measurements, bedrest. Due to complex medical history, have requested Hospitalist admission.  Stop  anticoagulation. Will need monitoring for a minimum of 48-72 hours to ensure hemodynamic stability off anticoagulation completely.  If she remains stable, she will be able to be discharged home and will then require outpatient repeat imaging in about 3 months to ensure no underlying renal mass as the etiology for her hematoma.  If she does develop hemodynamic instability or has a continued drop in her Hgb, she may benefit from blood transfusion and angioembolization if bleeding ultimately does not stop with conservative management (unlikely this will be necessary).   Leonidas Boateng,LES 03/04/2016, 7:44 AM  Pryor Curia. MD   CC: Dr. Dina Rich

## 2016-03-04 NOTE — H&P (Signed)
History and Physical  [name] [facility] [id number] DOB: 1933/01/06 DOA: 03/07/2016  Referring physician: ER physician PCP: Wenda Low, MD  Outpatient Specialists: Cardiology Patient coming from: Facility  Chief Complaint: Left Flank/lower back pain.  HPI: 82 year old caucasian female with history of atrial fibrillation for which patient has been on Eliquis, bioprosthesis heart valve, DM and multiple cardiac problems and CKD. Patient presents with back pain that developed spontaneously around 9 PM last night, radiating to the left groin area. No trauma. No fever or chills. No other constitutional symptoms. CT scan done revealed left large subcapsular hematoma compressing the left renal cortex. The hematoma is said to encompasses the entire kidney and measures up to 5 cm in thickness, with extension into the perinephric spaces, extending into the left pelvis, sigmoid mesentery and splenorenal ligament. No hydronephrosis reported, and atrophic bilateral kidney and partially collapsed bladder. Slight worsening of renal function is noted. Mild drop in H/H. Patient remains hemodynamically stable.  ED Course: Urology was consulted. Labs reviewed. Pain controlled. Pertinent labs: Mild elevation in Scr and mild drop in H/H.  Review of Systems: As in HPI. 12 systems were reviewed. Negative for fever, visual changes, sore throat, rash, new muscle aches, chest pain, SOB, dysuria, bleeding, n/v/abdominal pain.  Past Medical History  Diagnosis Date  . Coronary artery disease 09/04/08    single vessel CABG at time of AVR   . Aortic stenosis 09/04/08    s/p AVR by Dr Servando Snare (tissue valve)  . Paroxysmal atrial fibrillation (HCC)     remote history of PAF  . LBBB (left bundle branch block)   . CHF (congestive heart failure) (HCC)     diastolic  . OSA (obstructive sleep apnea)   . Alzheimer's dementia   . Diabetes mellitus     type 2  . Hypertension   . Chronic anemia   . Osteoporosis   . Chronic  renal failure   . Morbid obesity with BMI of 50.0-59.9, adult ([place]) 03/09/2012  . Peripheral vascular disease ([place])   . Arthritis   . Chronic diastolic heart failure ([place]) 07/20/2013  . Chronic right ear pain     "nerve damage"  . Atrial flutter ([place])   . Anemia in chronic kidney disease   . Major depressive disorder ([place])   . Alopecia   . Benign hypertensive kidney disease with chronic kidney disease stage I through stage IV, or unspecified   . Type II or unspecified type diabetes mellitus with neurological manifestations, not stated as uncontrolled   . Pure hypercholesterolemia   . Coronary atherosclerosis of native coronary artery   . Obesity   . GI bleed 08/2013  . Sick sinus syndrome ([place])     s/p PPM    Past Surgical History  Procedure Laterality Date  . Coronary artery bypass graft  09/04/08    single vessel at time of AVR  . Aortic valve replacement  12/09    tissue valve by Dr Servando Snare  . Cholecystectomy  1999  . Cataract extraction  2002  . Replacement total knee bilateral      L 2000, R 1997  . Appendectomy    . Closed reduction femoral shaft fracture  2007    R intramedullary rod  . Tee without cardioversion N/A 02/07/2013    Procedure: TRANSESOPHAGEAL ECHOCARDIOGRAM (TEE);  Surgeon: Candee Furbish, MD;  Location[facility] ENDOSCOPY;  Service: Cardiovascular;  Laterality: N/A;  . Pacemaker insertion  09/14/2013    MDT Adapta L implanted by Dr Lovena Le  for SSS  . Permanent pacemaker insertion N/A 09/14/2013    Procedure: PERMANENT PACEMAKER INSERTION;  Surgeon: Evans Lance, MD;  Location: [facility] CATH LAB;  Service: Cardiovascular;  Laterality: N/A;     reports that she has never smoked. She has never used smokeless tobacco. She reports that she does not drink alcohol or use illicit drugs.  Allergies  Allergen Reactions  . Morphine And Related Other (See Comments)    "went crazy"-Hallucinations  . Celebrex [Celecoxib] Other (See Comments)    Sore mouth  . Codeine Other  (See Comments)    Altered mental status  . Oxycodone Other (See Comments)    Altered mental status  . Sulfa Antibiotics Itching    Itching all over body  . Wellbutrin [Bupropion] Other (See Comments)    Hallucinations    Family History  Problem Relation Age of Onset  . Heart attack Father 19    multiple MIs before death  . Diabetes Father   . CAD Father   . Lung cancer Mother 34  . Colon cancer Mother   . Hypertension Mother   . Hypertension Sister   . Multiple sclerosis Brother   . Diabetes Sister      Prior to Admission medications   Medication Sig Start Date End Date Taking? Authorizing Provider  acetaminophen (TYLENOL) 500 MG tablet Take 500 mg by mouth every 8 (eight) hours as needed for mild pain.   Yes Historical Provider, MD  albuterol (PROVENTIL HFA;VENTOLIN HFA) 108 (90 BASE) MCG/ACT inhaler Inhale 2 puffs into the lungs every 6 (six) hours as needed for wheezing. 07/26/13  Yes Belkys A Regalado, MD  amiodarone (PACERONE) 200 MG tablet Take 100 mg by mouth 2 (two) times daily.   Yes Historical Provider, MD  amoxicillin (AMOXIL) 500 MG tablet Take 2,000 mg by mouth as directed. Take 1 hour before dental procedures   Yes Historical Provider, MD  apixaban (ELIQUIS) 5 MG TABS tablet Take 5 mg by mouth 2 (two) times daily.   Yes Historical Provider, MD  cetirizine (ZYRTEC) 10 MG tablet Take 10 mg by mouth daily.   Yes Historical Provider, MD  docusate sodium (COLACE) 100 MG capsule Take 100 mg by mouth 2 (two) times daily.   Yes Historical Provider, MD  donepezil (ARICEPT) 10 MG tablet Take 1 tablet (10 mg total) by mouth at bedtime. 08/11/12  Yes Jacquelyn A McGill, MD  DULoxetine (CYMBALTA) 60 MG capsule Take 60 mg by mouth daily.   Yes Historical Provider, MD  ferrous sulfate 325 (65 FE) MG tablet Take 1 tablet (325 mg total) by mouth 2 (two) times daily. 08/11/12  Yes Jacquelyn A McGill, MD  fish oil-omega-3 fatty acids 1000 MG capsule Take 1 g by mouth daily. 08/04/12   Yes Theodis Blaze, MD  furosemide (LASIX) 80 MG tablet Take 80 mg by mouth 2 (two) times daily.    Yes Historical Provider, MD  gabapentin (NEURONTIN) 600 MG tablet Take 600 mg by mouth 3 (three) times daily.  08/11/12  Yes Jacquelyn A McGill, MD  guaiFENesin (MUCINEX) 600 MG 12 hr tablet Take 600 mg by mouth daily.   Yes Historical Provider, MD  insulin aspart (NOVOLOG) 100 UNIT/ML injection Inject 2-9 Units into the skin 3 (three) times daily with meals. Sliding scale 151-200=2  201-250=3 251-300=5 301-350=7 >350=9   Yes Historical Provider, MD  insulin detemir (LEVEMIR) 100 UNIT/ML injection Inject 24 Units into the skin at bedtime.   Yes Historical Provider, MD  lactobacillus (FLORANEX/LACTINEX) PACK Take 1 g by mouth daily.   Yes Historical Provider, MD  Melatonin 5 MG TABS Take 5 mg by mouth at bedtime as needed (insomnia).   Yes Historical Provider, MD  Multiple Vitamin (MULTIVITAMIN) tablet Take 1 tablet by mouth daily.    Yes Historical Provider, MD  potassium chloride SA (K-DUR,KLOR-CON) 20 MEQ tablet Take 20 mEq by mouth 3 (three) times daily.   Yes Historical Provider, MD  PRESCRIPTION MEDICATION Apply 1 application topically at bedtime. Capsacin Cream- to both feet.   Yes Historical Provider, MD  senna (SENOKOT) 8.6 MG tablet Take 2 tablets by mouth daily.   Yes Historical Provider, MD  simvastatin (ZOCOR) 10 MG tablet Take 10 mg by mouth daily at 6 PM.   Yes Historical Provider, MD  spironolactone (ALDACTONE) 12.5 mg TABS tablet Take 0.5 tablets (12.5 mg total) by mouth 2 (two) times daily. 08/20/13  Yes Jerline Pain, MD  traMADol (ULTRAM-ER) 100 MG 24 hr tablet Take 100 mg by mouth at bedtime as needed.    Yes Historical Provider, MD  Vitamin D, Cholecalciferol, 1000 units TABS Take 2,000 Units by mouth daily.   Yes Historical Provider, MD    Physical Exam: Filed Vitals:   03/07/2016 [id number] 03/23/2016 0845 03/22/2016 0915 03/23/2016 0930  BP:  146/44 126/60 108/39  Pulse:   59 62    Temp: 97.4 F (36.3 C)     TempSrc: Oral     Resp:  13 19 19   Height: 5' 2.5" (1.588 m)     Weight: 115.5 kg (254 lb 10.1 oz)     SpO2:   98% 98%   Constitutional:  . Bald headed. Obese. Appears calm and comfortable Eyes:  Marland Kitchen Mild pallor. ENMT:  . external ears, nose appear normal Neck:  Supple. No JVD.   Respiratory:  . CTA bilaterally, no w/r/r.  . Respiratory effort normal. No retractions or accessory muscle use Cardiovascular:  S1S2 Abdomen:  . Abdomen appears normal; no tenderness or masses  Neurologic:  Awake and alert. Moves all limbs.    Wt Readings from Last 3 Encounters:  02/26/2016 115.5 kg (254 lb 10.1 oz)  09/23/14 112.492 kg (248 lb)  06/24/14 106.142 kg (234 lb)    I have personally reviewed following labs.  Labs on Admission:  CBC:  Recent Labs Lab 03/19/2016 0459  WBC 18.5*  NEUTROABS 15.3*  HGB 11.9*  HCT 35.6*  MCV 88.3  PLT 123456*   Basic Metabolic Panel:  Recent Labs Lab 03/21/2016 0459  NA 135  K 4.4  CL 101  CO2 25  GLUCOSE 330*  BUN 30*  CREATININE 1.59*  CALCIUM 8.7*   Liver Function Tests:  Recent Labs Lab 03/20/2016 0459  AST 26  ALT 16  ALKPHOS 90  BILITOT 0.8  PROT 6.7  ALBUMIN 3.5    Recent Labs Lab 03/01/2016 0459  LIPASE 32   No results for input(s): AMMONIA in the last 168 hours. Coagulation Profile: No results for input(s): INR, PROTIME in the last 168 hours. Cardiac Enzymes: No results for input(s): CKTOTAL, CKMB, CKMBINDEX, TROPONINI in the last 168 hours. BNP (last 3 results) No results for input(s): PROBNP in the last 8760 hours. HbA1C: No results for input(s): HGBA1C in the last 72 hours. CBG: No results for input(s): GLUCAP in the last 168 hours. Lipid Profile: No results for input(s): CHOL, HDL, LDLCALC, TRIG, CHOLHDL, LDLDIRECT in the last 72 hours. Thyroid Function Tests: No results for input(s): TSH, T4TOTAL,  FREET4, T3FREE, THYROIDAB in the last 72 hours. Anemia Panel: No results for  input(s): VITAMINB12, FOLATE, FERRITIN, TIBC, IRON, RETICCTPCT in the last 72 hours. Urine analysis:    Component Value Date/Time   COLORURINE YELLOW 03/16/2016 0536   APPEARANCEUR CLEAR 03/02/2016 0536   LABSPEC 1.020 03/10/2016 0536   PHURINE 5.5 03/09/2016 0536   GLUCOSEU 250* 03/09/2016 0536   HGBUR NEGATIVE 03/24/2016 0536   BILIRUBINUR NEGATIVE 03/01/2016 0536   KETONESUR NEGATIVE 02/27/2016 0536   PROTEINUR NEGATIVE 03/11/2016 0536   UROBILINOGEN 0.2 02/12/2014 2035   NITRITE NEGATIVE 02/27/2016 0536   LEUKOCYTESUR NEGATIVE 03/15/2016 0536   Sepsis Labs: @LABRCNTIP (procalcitonin:4,lacticidven:4) )No results found for this or any previous visit (from the past 240 hour(s)).    Radiological Exams on Admission: Dg Chest Portable 1 View  03/11/2016  CLINICAL DATA:  Shortness of breath EXAM: PORTABLE CHEST 1 VIEW COMPARISON:  02/12/2014 FINDINGS: Chronic cardiopericardial enlargement. Dual-chamber pacer leads from the left are in stable position. Status post median sternotomy for aortic valve replacement. Prominent mitral valve calcification. Fracturing of upper sternal wires is chronic. Interstitial coarsening above previous baseline. No effusion or pneumothorax. Stable appearance of the hila. IMPRESSION: 1. COPD with bronchitic markings accentuated by low volumes and atelectasis. 2. Chronic cardiomegaly. Electronically Signed   By: Monte Fantasia M.D.   On: 03/04/2016 06:22   Ct Renal Stone Study  03/04/2016  CLINICAL DATA:  Acute onset of back pain radiating to the abdomen. EXAM: CT ABDOMEN AND PELVIS WITHOUT CONTRAST TECHNIQUE: Multidetector CT imaging of the abdomen and pelvis was performed following the standard protocol without IV contrast. COMPARISON:  Pelvis CT 02/13/2014 FINDINGS: Lower chest and abdominal wall: Bronchial wall thickening seen at the bases. Trace left effusion. Hepatobiliary: Cirrhotic liver without gross mass lesion. Cholecystectomy. Pancreas: Atrophy without  acute finding. Spleen: Small volume fluid around the spleen, likely reactive to the neighboring hemorrhage. Adrenals/Urinary Tract:  Negative adrenals. Large subcapsular hematoma compressing the left renal cortex. The hematoma encompasses the entire kidney and measures up to 5 cm in thickness. There is extension into the perinephric spaces dissecting into the left pelvis, sigmoid mesentery and splenorenal ligament. When allowing for clot layering, no definitive mass lesion is seen. No hydronephrosis. Atrophic bilateral kidney. Partially collapsed bladder. Reproductive:No pathologic findings. Stomach/Bowel: No obstruction. Appendectomy. Distal colonic diverticulosis. Vascular/Lymphatic: Extensive atherosclerosis. Flat IVC which may be from intravascular depletion. No mass or adenopathy. Peritoneal: No ascites or pneumoperitoneum. Musculoskeletal: Osteopenia and extensive spondylosis and disc degeneration. No acute finding. Critical Value/emergent results were called by telephone at the time of interpretation on 03/04/2016 at 6:42 am to Dr. Thayer Jew , who verbally acknowledged these results. IMPRESSION: 1. Large left subcapsular and perinephric hematoma dissecting into the retroperitoneal ligaments and left pelvis. In the absence of trauma, this could be spontaneous or from obscured renal mass. 2. Intravascular depletion with collapsed IVC. 3. Cirrhosis. Electronically Signed   By: Monte Fantasia M.D.   On: 03/04/2016 06:50    Active Problems:   Perinephric hematoma   Assessment/Plan 1. Perinephric Hematoma 2. Anemia 3. Atrial fibrillation 4. History of bioprosthesis Valvular replacement 5. CAD, stable 6. DM 7. Hypertension   Admit patient to step down  Monitor H/H  Supportive care. Monitor BP and heart rate  Transfuse PRBC PRN  Pain control  Optimize blood sugar control  Further management will depend on hospital course.  DVT prophylaxis: SCD Code Status:  Full Family  Communication:  Daughter, Sister Disposition Plan:  Back to facility eventually.  Consults called:  Already seen by Urology. Cardiology and Urology will need to liaise and decide when and if anticoagulation can be safely restarted.   Admission status:  Inpatient    Time spent: 60 minutes  Dana Allan, MD  [facility] Pager #: [phone] 7PM-7AM contact night coverage as above  03/20/2016, 10:20 AM

## 2016-03-04 NOTE — Progress Notes (Signed)
    Primary cardiologist: Dr. Candee Furbish  Received page as on call M.D. from [place] regarding patient [name]. Has history of PAF on Eliquis and amiodarone as an outpatient, also CAD, cardiomyopathy with LVEF 45-50% by last assessment, left bundle branch block, and bioprosthetic AVR in 2009. Patient admitted with lower back pain with subsequent CT imaging demonstrating a large left subcapsular hematoma compressing left renal cortex. Anticoagulation stopped and she is being managed in the ICU. Noted to have wide complex tachycardia intermittently by telemetry and also follow-up ECG, heart rate 140-150. Otherwise hemodynamically stable by report. Dr. Marthenia Rolling checking electrolytes and has ordered an echocardiogram. I recommended converting to IV amiodarone for the time being. VT would be primary concern, although with left bundle-branch block morphology this could be an aberrantly conducted SVT or atrial flutter.  Satira Sark, M.D., F.A.C.C.

## 2016-03-04 NOTE — ED Notes (Signed)
PT transported to CT>

## 2016-03-04 NOTE — ED Provider Notes (Signed)
CSN: [id number]     Arrival date & time 03/01/2016  0436 History   First MD Initiated Contact with Patient 03/10/2016 [phone]     Chief Complaint  Patient presents with  . Back Pain     (Consider location/radiation/quality/duration/timing/severity/associated sxs/prior Treatment) HPI  This is an 82 year old female with multiple medical problems who presents with back pain. Patient reports left-sided back pain that started last night. It is dull and it radiates into her abdomen. Currently she states that the pain is 10 out of 10. Worse with movement. She was given tramadol at her living facility with no relief. Reports decreased urination but no dysuria or hematuria. Denies vomiting or diarrhea. Does endorse nausea. Denies chest pain or shortness of breath.  Past Medical History  Diagnosis Date  . Coronary artery disease 09/04/08    single vessel CABG at time of AVR   . Aortic stenosis 09/04/08    s/p AVR by Dr Servando Snare (tissue valve)  . Paroxysmal atrial fibrillation (HCC)     remote history of PAF  . LBBB (left bundle branch block)   . CHF (congestive heart failure) (HCC)     diastolic  . OSA (obstructive sleep apnea)   . Alzheimer's dementia   . Diabetes mellitus     type 2  . Hypertension   . Chronic anemia   . Osteoporosis   . Chronic renal failure   . Morbid obesity with BMI of 50.0-59.9, adult ([place]) 03/09/2012  . Peripheral vascular disease ([place])   . Arthritis   . Chronic diastolic heart failure ([place]) 07/20/2013  . Chronic right ear pain     "nerve damage"  . Atrial flutter ([place])   . Anemia in chronic kidney disease   . Major depressive disorder ([place])   . Alopecia   . Benign hypertensive kidney disease with chronic kidney disease stage I through stage IV, or unspecified   . Type II or unspecified type diabetes mellitus with neurological manifestations, not stated as uncontrolled   . Pure hypercholesterolemia   . Coronary atherosclerosis of native coronary artery   . Obesity    . GI bleed 08/2013  . Sick sinus syndrome ([place])     s/p PPM   Past Surgical History  Procedure Laterality Date  . Coronary artery bypass graft  09/04/08    single vessel at time of AVR  . Aortic valve replacement  12/09    tissue valve by Dr Servando Snare  . Cholecystectomy  1999  . Cataract extraction  2002  . Replacement total knee bilateral      L 2000, R 1997  . Appendectomy    . Closed reduction femoral shaft fracture  2007    R intramedullary rod  . Tee without cardioversion N/A 02/07/2013    Procedure: TRANSESOPHAGEAL ECHOCARDIOGRAM (TEE);  Surgeon: Candee Furbish, MD;  Location: [facility] ENDOSCOPY;  Service: Cardiovascular;  Laterality: N/A;  . Pacemaker insertion  09/14/2013    MDT Adapta L implanted by Dr Lovena Le for SSS  . Permanent pacemaker insertion N/A 09/14/2013    Procedure: PERMANENT PACEMAKER INSERTION;  Surgeon: Evans Lance, MD;  Location: [facility] CATH LAB;  Service: Cardiovascular;  Laterality: N/A;   Family History  Problem Relation Age of Onset  . Heart attack Father 45    multiple MIs before death  . Diabetes Father   . CAD Father   . Lung cancer Mother 73  . Colon cancer Mother   . Hypertension Mother   . Hypertension Sister   .  Multiple sclerosis Brother   . Diabetes Sister    Social History  Substance Use Topics  . Smoking status: Never Smoker   . Smokeless tobacco: Never Used  . Alcohol Use: No   OB History    No data available     Review of Systems  Constitutional: Negative for fever and chills.  Respiratory: Negative for cough, chest tightness and shortness of breath.   Cardiovascular: Negative for chest pain.  Gastrointestinal: Positive for nausea and abdominal pain. Negative for vomiting and diarrhea.  Genitourinary: Positive for flank pain and decreased urine volume. Negative for dysuria and difficulty urinating.  Musculoskeletal: Positive for back pain.  Skin: Negative for wound.  Neurological: Negative for headaches.  Psychiatric/Behavioral:  Negative for confusion.  All other systems reviewed and are negative.     Allergies  Morphine and related; Celebrex; Codeine; Oxycodone; Sulfa antibiotics; and Wellbutrin  Home Medications   Prior to Admission medications   Medication Sig Start Date End Date Taking? Authorizing Provider  acetaminophen (TYLENOL) 500 MG tablet Take 500 mg by mouth every 8 (eight) hours as needed for mild pain.   Yes Historical Provider, MD  albuterol (PROVENTIL HFA;VENTOLIN HFA) 108 (90 BASE) MCG/ACT inhaler Inhale 2 puffs into the lungs every 6 (six) hours as needed for wheezing. 07/26/13  Yes Belkys A Regalado, MD  amiodarone (PACERONE) 200 MG tablet Take 100 mg by mouth 2 (two) times daily.   Yes Historical Provider, MD  amoxicillin (AMOXIL) 500 MG tablet Take 2,000 mg by mouth as directed. Take 1 hour before dental procedures   Yes Historical Provider, MD  apixaban (ELIQUIS) 5 MG TABS tablet Take 5 mg by mouth 2 (two) times daily.   Yes Historical Provider, MD  cetirizine (ZYRTEC) 10 MG tablet Take 10 mg by mouth daily.   Yes Historical Provider, MD  docusate sodium (COLACE) 100 MG capsule Take 100 mg by mouth 2 (two) times daily.   Yes Historical Provider, MD  donepezil (ARICEPT) 10 MG tablet Take 1 tablet (10 mg total) by mouth at bedtime. 08/11/12  Yes Jacquelyn A McGill, MD  DULoxetine (CYMBALTA) 60 MG capsule Take 60 mg by mouth daily.   Yes Historical Provider, MD  ferrous sulfate 325 (65 FE) MG tablet Take 1 tablet (325 mg total) by mouth 2 (two) times daily. 08/11/12  Yes Jacquelyn A McGill, MD  fish oil-omega-3 fatty acids 1000 MG capsule Take 1 g by mouth daily. 08/04/12  Yes Theodis Blaze, MD  furosemide (LASIX) 80 MG tablet Take 80 mg by mouth 2 (two) times daily.    Yes Historical Provider, MD  gabapentin (NEURONTIN) 600 MG tablet Take 600 mg by mouth 3 (three) times daily.  08/11/12  Yes Jacquelyn A McGill, MD  guaiFENesin (MUCINEX) 600 MG 12 hr tablet Take 600 mg by mouth daily.   Yes  Historical Provider, MD  insulin aspart (NOVOLOG) 100 UNIT/ML injection Inject 2-9 Units into the skin 3 (three) times daily with meals. Sliding scale 151-200=2  201-250=3 251-300=5 301-350=7 >350=9   Yes Historical Provider, MD  insulin detemir (LEVEMIR) 100 UNIT/ML injection Inject 24 Units into the skin at bedtime.   Yes Historical Provider, MD  lactobacillus (FLORANEX/LACTINEX) PACK Take 1 g by mouth daily.   Yes Historical Provider, MD  Melatonin 5 MG TABS Take 5 mg by mouth at bedtime as needed (insomnia).   Yes Historical Provider, MD  Multiple Vitamin (MULTIVITAMIN) tablet Take 1 tablet by mouth daily.    Yes Historical Provider,  MD  potassium chloride SA (K-DUR,KLOR-CON) 20 MEQ tablet Take 20 mEq by mouth 3 (three) times daily.   Yes Historical Provider, MD  PRESCRIPTION MEDICATION Apply 1 application topically at bedtime. Capsacin Cream- to both feet.   Yes Historical Provider, MD  senna (SENOKOT) 8.6 MG tablet Take 2 tablets by mouth daily.   Yes Historical Provider, MD  simvastatin (ZOCOR) 10 MG tablet Take 10 mg by mouth daily at 6 PM.   Yes Historical Provider, MD  spironolactone (ALDACTONE) 12.5 mg TABS tablet Take 0.5 tablets (12.5 mg total) by mouth 2 (two) times daily. 08/20/13  Yes Jerline Pain, MD  traMADol (ULTRAM-ER) 100 MG 24 hr tablet Take 100 mg by mouth at bedtime as needed.    Yes Historical Provider, MD  Vitamin D, Cholecalciferol, 1000 units TABS Take 2,000 Units by mouth daily.   Yes Historical Provider, MD  apixaban (ELIQUIS) 2.5 MG TABS tablet Take 1 tablet (2.5 mg total) by mouth 2 (two) times daily. Patient not taking: Reported on 03/24/2016 10/03/13   Jerline Pain, MD   BP 146/59 mmHg  Pulse 59  Temp(Src) 97.4 F (36.3 C) (Oral)  Resp 18  SpO2 99% Physical Exam  Constitutional: She is oriented to person, place, and time. No distress.  Morbidly obese, no acute distress  HENT:  Head: Normocephalic and atraumatic.  Alopecia  Eyes: Pupils are equal,  round, and reactive to light.  Cardiovascular: Normal rate, regular rhythm and normal heart sounds.   No murmur heard. Pulmonary/Chest: Effort normal and breath sounds normal. No respiratory distress. She has no wheezes.  Abdominal: Soft. Bowel sounds are normal. There is tenderness. There is no rebound and no guarding.  Tenderness to palpation diffusely over the anterior abdomen and left flank, no signs of rebound or guarding, no evidence peritonitis  Musculoskeletal:  Trace bilateral lower extremity edema  Neurological: She is alert and oriented to person, place, and time.  Skin: Skin is warm and dry.  Psychiatric: She has a normal mood and affect.  Nursing note and vitals reviewed.   ED Course  Procedures (including critical care time)  CRITICAL CARE Performed by: Merryl Hacker   Total critical care time: 30 minutes  Critical care time was exclusive of separately billable procedures and treating other patients.  Critical care was necessary to treat or prevent imminent or life-threatening deterioration.  Critical care was time spent personally by me on the following activities: development of treatment plan with patient and/or surrogate as well as nursing, discussions with consultants, evaluation of patient's response to treatment, examination of patient, obtaining history from patient or surrogate, ordering and performing treatments and interventions, ordering and review of laboratory studies, ordering and review of radiographic studies, pulse oximetry and re-evaluation of patient's condition.  Labs Review Labs Reviewed  CBC WITH DIFFERENTIAL/PLATELET - Abnormal; Notable for the following:    WBC 18.5 (*)    Hemoglobin 11.9 (*)    HCT 35.6 (*)    Platelets 120 (*)    Neutro Abs 15.3 (*)    Monocytes Absolute 1.5 (*)    All other components within normal limits  COMPREHENSIVE METABOLIC PANEL - Abnormal; Notable for the following:    Glucose, Bld 330 (*)    BUN 30 (*)     Creatinine, Ser 1.59 (*)    Calcium 8.7 (*)    GFR calc non Af Amer 30 (*)    GFR calc Af Amer 34 (*)    All other components within  normal limits  URINALYSIS, ROUTINE W REFLEX MICROSCOPIC (NOT AT [facility]) - Abnormal; Notable for the following:    Glucose, UA 250 (*)    All other components within normal limits  I-STAT CG4 LACTIC ACID, ED - Abnormal; Notable for the following:    Lactic Acid, Venous 2.44 (*)    All other components within normal limits  URINE CULTURE  LIPASE, BLOOD  TYPE AND SCREEN    Imaging Review Dg Chest Portable 1 View  03/26/2016  CLINICAL DATA:  Shortness of breath EXAM: PORTABLE CHEST 1 VIEW COMPARISON:  02/12/2014 FINDINGS: Chronic cardiopericardial enlargement. Dual-chamber pacer leads from the left are in stable position. Status post median sternotomy for aortic valve replacement. Prominent mitral valve calcification. Fracturing of upper sternal wires is chronic. Interstitial coarsening above previous baseline. No effusion or pneumothorax. Stable appearance of the hila. IMPRESSION: 1. COPD with bronchitic markings accentuated by low volumes and atelectasis. 2. Chronic cardiomegaly. Electronically Signed   By: Monte Fantasia M.D.   On: 03/17/2016 06:22   Ct Renal Stone Study  03/21/2016  CLINICAL DATA:  Acute onset of back pain radiating to the abdomen. EXAM: CT ABDOMEN AND PELVIS WITHOUT CONTRAST TECHNIQUE: Multidetector CT imaging of the abdomen and pelvis was performed following the standard protocol without IV contrast. COMPARISON:  Pelvis CT 02/13/2014 FINDINGS: Lower chest and abdominal wall: Bronchial wall thickening seen at the bases. Trace left effusion. Hepatobiliary: Cirrhotic liver without gross mass lesion. Cholecystectomy. Pancreas: Atrophy without acute finding. Spleen: Small volume fluid around the spleen, likely reactive to the neighboring hemorrhage. Adrenals/Urinary Tract:  Negative adrenals. Large subcapsular hematoma compressing the left renal  cortex. The hematoma encompasses the entire kidney and measures up to 5 cm in thickness. There is extension into the perinephric spaces dissecting into the left pelvis, sigmoid mesentery and splenorenal ligament. When allowing for clot layering, no definitive mass lesion is seen. No hydronephrosis. Atrophic bilateral kidney. Partially collapsed bladder. Reproductive:No pathologic findings. Stomach/Bowel: No obstruction. Appendectomy. Distal colonic diverticulosis. Vascular/Lymphatic: Extensive atherosclerosis. Flat IVC which may be from intravascular depletion. No mass or adenopathy. Peritoneal: No ascites or pneumoperitoneum. Musculoskeletal: Osteopenia and extensive spondylosis and disc degeneration. No acute finding. Critical Value/emergent results were called by telephone at the time of interpretation on 03/11/2016 at 6:42 am to Dr. Thayer Jew , who verbally acknowledged these results. IMPRESSION: 1. Large left subcapsular and perinephric hematoma dissecting into the retroperitoneal ligaments and left pelvis. In the absence of trauma, this could be spontaneous or from obscured renal mass. 2. Intravascular depletion with collapsed IVC. 3. Cirrhosis. Electronically Signed   By: Monte Fantasia M.D.   On: 03/10/2016 06:50   I have personally reviewed and evaluated these images and lab results as part of my medical decision-making.   EKG Interpretation   Date/Time:  Thursday March 04 2016 06:56:34 EDT Ventricular Rate:  63 PR Interval:  177 QRS Duration: 159 QT Interval:  501 QTC Calculation: 513 R Axis:   -93 Text Interpretation:  Atrial-paced complexes Nonspecific IVCD with LAD  Abnormal lateral Q waves Paced rhythm, no significant change since tracing  Confirmed by HORTON  MD, COURTNEY ([id number]) on 03/12/2016 7:01:31 AM      MDM   Final diagnoses:  Perinephric hematoma    Patient presents with acute onset back pain and abdominal pain. She is nontoxic-appearing. Vital signs are reassuring.  She is tender over her left flank and diffusely over her abdomen. He is afebrile. Lab work was obtained. Patient was given pain medication. Initial  lactate mildly elevated 2.44. She has a history of heart failure. She was given a 250 mL fluid bolus. She seemed dynamically stable. Workup notable for a white count of 18.5. Creatinine at baseline of 1.59. No evidence of urinary tract infection. No evidence of pneumonia. CT scan noncontrasted obtained given flank and abdominal pain.  Discussed with the radiologist. CT is concerning for a large spontaneous subcapsular hemorrhage of the left kidney.  I will consult urology.  6:44 AM On recheck, the patient's pain is controlled. She continues to be tender. Vital signs continued to be stable at this time. Hemoglobin 11.9.  Type and screen added to the patient's workup as well as EKG.  Patient is on a Eliquis for atrial fibrillation. She does have a tissue valve and multiple cardiac comorbidities.  I did discuss goals of care with the patient. She would like any reasonable interventions but does not want to be sustained on life support.  7:06 AM Discussed with Dr. Alinda Money.  He will evaluate the patient. Requesting admission to stepdown unit to the hospitalist given patient's medical comorbidities.  Hold all Eliquis and serial H/H at this time with bedrest.      Merryl Hacker, MD 03/03/2016 [phone]

## 2016-03-04 NOTE — Progress Notes (Signed)
PHARMACIST - PHYSICIAN ORDER COMMUNICATION  CONCERNING: P&T Medication Policy on Herbal Medications  DESCRIPTION:  This patient's order for:  Melatoniin  has been noted.  This product(s) is classified as an "herbal" or natural product. Due to a lack of definitive safety studies or FDA approval, nonstandard manufacturing practices, plus the potential risk of unknown drug-drug interactions while on inpatient medications, the Pharmacy and Therapeutics Committee does not permit the use of "herbal" or natural products of this type within [facility].   ACTION TAKEN: The pharmacy department is unable to verify this order at this time and your patient has been informed of this safety policy. Please reevaluate patient's clinical condition at discharge and address if the herbal or natural product(s) should be resumed at that time.  Peggyann Juba, PharmD, BCPS 03/15/2016 10:30 AM

## 2016-03-04 NOTE — Care Management Note (Signed)
Case Management Note  Patient Details  Name: [name] MRN: [id number] Date of Birth: [birth year]  Subjective/Objective:                 rectoperineal bleed in patient on anticoagulants    Action/Plan:Date:  March 04, 2016 Chart reviewed for concurrent status and case management needs. Will continue to follow the patient for changes and needs: Expected discharge date: [id number] Velva Harman, BSN, [place], [place]   Expected Discharge Date:   (UNKNOWN)               Expected Discharge Plan:  Home/Self Care  In-House Referral:  NA  Discharge planning Services  CM Consult  Post Acute Care Choice:  NA Choice offered to:  NA  DME Arranged:    DME Agency:     HH Arranged:    [place] Agency:     Status of Service:  In process, will continue to follow  Medicare Important Message Given:    Date Medicare IM Given:    Medicare IM give by:    Date Additional Medicare IM Given:    Additional Medicare Important Message give by:     If discussed at [place] of Stay Meetings, dates discussed:    Additional Comments:  Leeroy Cha, RN 03/16/2016, 10:34 AM

## 2016-03-04 NOTE — Progress Notes (Signed)
Dr. Marthenia Rolling notified via text page about pt's admission to the unit.

## 2016-03-04 NOTE — ED Notes (Signed)
Pt BIB EMS from [facility] c/o back pain that radiates into her upper abdomen. Denies SOB, chest pain. Pt states that the pain is blunt when still and 10/10 when moving. Took tramadol with no relief.Denies dark urine, burning, or dysuria, however endorses less frequent urination today.   PT has a pacemaker

## 2016-03-04 NOTE — ED Notes (Addendum)
Urology MD at bedside

## 2016-03-04 NOTE — ED Notes (Signed)
Twenty minute timer started at 8am for the ICU/Stepdown as confirmed with Pam, RN in the ICU.

## 2016-03-04 NOTE — Progress Notes (Signed)
Pt's heart rhythm is changing to Wide QRS tachycardia periodically. Each episode lasts about 15 to 30s, then rhythm changes to V-paced. These episodes are occuring at least once every 30 minutes. EKG obtained. Dr. Marthenia Rolling notified.     03/12/2016 1205  Vitals  BP (!) 125/36 mmHg  MAP (mmHg) 69  Pulse Rate 70  ECG Heart Rate 70  Resp 19  Oxygen Therapy  SpO2 99 %  O2 Device Nasal Cannula  O2 Flow Rate (L/min) 2 L/min  Pain Assessment  Pain Assessment 0-10  Pain Score 4  Pain Intervention(s) Emotional support;Repositioned  Glasgow Coma Scale  Eye Opening 4  Best Verbal Response (NON-intubated) 5  Best Motor Response 6  Glasgow Coma Scale Score 15

## 2016-03-04 NOTE — Clinical Social Work Note (Signed)
Clinical Social Work Assessment  Patient Details  Name: [name] MRN: [id number] Date of Birth: 05-Oct-1932  Date of referral:  03/16/2016               Reason for consult:  Facility Placement, Discharge Planning                Permission sought to share information with:  Facility Art therapist granted to share information::  Yes, Verbal Permission Granted  Name::        Agency::     Relationship::     Contact Information:     Housing/Transportation Living arrangements for the past 2 months:  [place] of Information:  Adult Children Patient Interpreter Needed:  None Criminal Activity/Legal Involvement Pertinent to Current Situation/Hospitalization:  No - Comment as needed Significant Relationships:  Adult Children, Siblings, Other Family Members Lives with:  Facility Resident Do you feel safe going back to the place where you live?   (daughter reports :  " Yes " ) Need for family participation in patient care:  Yes (Comment)  Care giving concerns:  No concerns reported by daughter.   Social Worker assessment / plan: Pt is a LTC resident from [facility] [place]. Pt hospitalized on 03/14/2016 with a Perinephric Hematoma. Pt was sleeping soundly when CSW visited this afternoon. CSW met with pt's daughter, [name] [phone], pt's sister and granddaughter at bedside. Daughter reports pt has been a resident of [facility] for the past 2 yrs. Daughter reports that pt would like to return to SNF at d/c. Daughter gave CSW permission to contact SNF and provide medical update. CSW contacted Admissions Coordinator at SNF. It's unclear, at this point, if pt / family will pay to hold bed for pt's return. SNF will readmit, pending bed availability, if bed hold is not in place. CSW will continue to follow to assist with d/c planning needs.  Employment status:  Retired Nurse, adult, Medicaid In [place] PT Recommendations:  Not  assessed at this time Information / Referral to community resources:     Patient/Family's Response to care:  Daughter reports that pt would like to return to SNF at d/c.   Patient/Family's Understanding of and Emotional Response to Diagnosis, Current Treatment, and Prognosis:  Daughter is aware of pt's medical status. Daughter reports that her mother is a " social butterfly " at facility and has adjusted very well to placement.  Emotional Assessment Appearance:  Appears stated age Attitude/Demeanor/Rapport:  Unable to Assess Affect (typically observed):  Unable to Assess Orientation:  Oriented to Self, Oriented to Place Alcohol / Substance use:  Not Applicable Psych involvement (Current and /or in the community):  No (Comment)  Discharge Needs  Concerns to be addressed:  Discharge Planning Concerns Readmission within the last 30 days:  No Current discharge risk:  None Barriers to Discharge:  No Barriers Identified   Luretha Rued, [place] 03/09/2016, 2:58 PM

## 2016-03-04 NOTE — ED Notes (Signed)
Bed: [id number] Expected date:  Expected time:  Means of arrival:  Comments: 82 yo F  Low back pain, abd pain

## 2016-03-04 NOTE — ED Notes (Signed)
MD at bedside. 

## 2016-03-04 NOTE — Progress Notes (Addendum)
RT obtained ABG and will alert RN of values. PH:7.26 CO2 44.1 O2 64.5

## 2016-03-04 NOTE — Progress Notes (Signed)
Pt continues to have short episodes of V-tach. BP remains stable. Pt is lethargic. MD is notified via text page. No response, no new order at this time. Will continue to monitor.    03/19/2016 1805  Vitals  BP (!) 113/33 mmHg  MAP (mmHg) 59  Pulse Rate 69  ECG Heart Rate 72  Resp 18  Oxygen Therapy  SpO2 96 %  O2 Device Nasal Cannula  O2 Flow Rate (L/min) 2 L/min

## 2016-03-05 ENCOUNTER — Inpatient Hospital Stay (HOSPITAL_COMMUNITY): Payer: Medicare Other

## 2016-03-05 DIAGNOSIS — I959 Hypotension, unspecified: Secondary | ICD-10-CM

## 2016-03-05 DIAGNOSIS — R9431 Abnormal electrocardiogram [ECG] [EKG]: Secondary | ICD-10-CM

## 2016-03-05 DIAGNOSIS — Z7189 Other specified counseling: Secondary | ICD-10-CM | POA: Insufficient documentation

## 2016-03-05 DIAGNOSIS — N179 Acute kidney failure, unspecified: Secondary | ICD-10-CM | POA: Diagnosis present

## 2016-03-05 DIAGNOSIS — I10 Essential (primary) hypertension: Secondary | ICD-10-CM

## 2016-03-05 DIAGNOSIS — R000 Tachycardia, unspecified: Secondary | ICD-10-CM

## 2016-03-05 DIAGNOSIS — Z954 Presence of other heart-valve replacement: Secondary | ICD-10-CM

## 2016-03-05 DIAGNOSIS — I480 Paroxysmal atrial fibrillation: Secondary | ICD-10-CM | POA: Diagnosis present

## 2016-03-05 DIAGNOSIS — N2889 Other specified disorders of kidney and ureter: Secondary | ICD-10-CM

## 2016-03-05 DIAGNOSIS — D62 Acute posthemorrhagic anemia: Secondary | ICD-10-CM

## 2016-03-05 DIAGNOSIS — I472 Ventricular tachycardia: Secondary | ICD-10-CM

## 2016-03-05 DIAGNOSIS — Z7901 Long term (current) use of anticoagulants: Secondary | ICD-10-CM

## 2016-03-05 DIAGNOSIS — Z515 Encounter for palliative care: Secondary | ICD-10-CM | POA: Insufficient documentation

## 2016-03-05 DIAGNOSIS — Z950 Presence of cardiac pacemaker: Secondary | ICD-10-CM | POA: Diagnosis present

## 2016-03-05 DIAGNOSIS — N170 Acute kidney failure with tubular necrosis: Secondary | ICD-10-CM

## 2016-03-05 DIAGNOSIS — E875 Hyperkalemia: Secondary | ICD-10-CM

## 2016-03-05 LAB — COMPREHENSIVE METABOLIC PANEL
ALBUMIN: 3.5 g/dL (ref 3.5–5.0)
ALT: 380 U/L — ABNORMAL HIGH (ref 14–54)
ANION GAP: 12 (ref 5–15)
AST: 470 U/L — ABNORMAL HIGH (ref 15–41)
Alkaline Phosphatase: 98 U/L (ref 38–126)
BILIRUBIN TOTAL: 1 mg/dL (ref 0.3–1.2)
BUN: 49 mg/dL — ABNORMAL HIGH (ref 6–20)
CO2: 23 mmol/L (ref 22–32)
Calcium: 8.8 mg/dL — ABNORMAL LOW (ref 8.9–10.3)
Chloride: 99 mmol/L — ABNORMAL LOW (ref 101–111)
Creatinine, Ser: 3.54 mg/dL — ABNORMAL HIGH (ref 0.44–1.00)
GFR calc Af Amer: 13 mL/min — ABNORMAL LOW (ref >60)
GFR calc non Af Amer: 11 mL/min — ABNORMAL LOW (ref >60)
GLUCOSE: 294 mg/dL — AB (ref 65–99)
POTASSIUM: 6.6 mmol/L — AB (ref 3.5–5.1)
SODIUM: 134 mmol/L — AB (ref 135–145)
TOTAL PROTEIN: 6.4 g/dL — AB (ref 6.5–8.1)

## 2016-03-05 LAB — BLOOD GAS, ARTERIAL
ACID-BASE DEFICIT: 5.4 mmol/L — AB (ref 0.0–2.0)
Acid-base deficit: 6.7 mmol/L — ABNORMAL HIGH (ref 0.0–2.0)
Bicarbonate: 19.5 mEq/L — ABNORMAL LOW (ref 20.0–24.0)
Bicarbonate: 20.7 mEq/L (ref 20.0–24.0)
Drawn by: 418751
Drawn by: 418751
FIO2: 0.36
O2 Content: 1 L/min
O2 Saturation: 89.7 %
O2 Saturation: 94 %
PCO2 ART: 45.5 mmHg — AB (ref 35.0–45.0)
PH ART: 7.279 — AB (ref 7.350–7.450)
PO2 ART: 77.1 mmHg — AB (ref 80.0–100.0)
Patient temperature: 98.6
Patient temperature: 98.6
TCO2: 18.5 mmol/L (ref 0–100)
TCO2: 19.7 mmol/L (ref 0–100)
pCO2 arterial: 44.1 mmHg (ref 35.0–45.0)
pH, Arterial: 7.267 — ABNORMAL LOW (ref 7.350–7.450)
pO2, Arterial: 64.5 mmHg — ABNORMAL LOW (ref 80.0–100.0)

## 2016-03-05 LAB — RENAL FUNCTION PANEL
Albumin: 3.6 g/dL (ref 3.5–5.0)
Anion gap: 10 (ref 5–15)
BUN: 44 mg/dL — ABNORMAL HIGH (ref 6–20)
CO2: 24 mmol/L (ref 22–32)
Calcium: 8.6 mg/dL — ABNORMAL LOW (ref 8.9–10.3)
Chloride: 99 mmol/L — ABNORMAL LOW (ref 101–111)
Creatinine, Ser: 3.14 mg/dL — ABNORMAL HIGH (ref 0.44–1.00)
GFR calc Af Amer: 15 mL/min — ABNORMAL LOW (ref >60)
GFR calc non Af Amer: 13 mL/min — ABNORMAL LOW (ref >60)
Glucose, Bld: 348 mg/dL — ABNORMAL HIGH (ref 65–99)
Phosphorus: 7.1 mg/dL — ABNORMAL HIGH (ref 2.5–4.6)
Potassium: 6.7 mmol/L (ref 3.5–5.1)
Sodium: 133 mmol/L — ABNORMAL LOW (ref 135–145)

## 2016-03-05 LAB — ECHOCARDIOGRAM COMPLETE
AO mean calculated velocity dopler: 236 cm/s
AV Mean grad: 27 mmHg
AV Peak grad: 52 mmHg
AV VEL mean LVOT/AV: 0.44
AV pk vel: 360 cm/s
Ao pk vel: 0.41 m/s
Area-P 1/2: 5.64 cm2
FS: 42 % (ref 28–44)
Height: 62.5 in
IVS/LV PW RATIO, ED: 0.65
LA ID, A-P, ES: 46 mm
LA diam end sys: 46 mm
LA diam index: 2.16 cm/m2
LA vol A4C: 97.5 ml
LA vol index: 43.4 mL/m2
LA vol: 92.4 mL
LV PW d: 23.6 mm — AB (ref 0.6–1.1)
LVOT VTI: 22.7 cm
LVOT peak VTI: 0.43 cm
LVOT peak grad rest: 9 mmHg
LVOT peak vel: 149 cm/s
MV Annulus VTI: 40.4 cm
MV M vel: 66.7
Mean grad: 3 mmHg
P 1/2 time: 39 ms
VTI: 52.6 cm
Weight: 4074.1 oz

## 2016-03-05 LAB — URINE CULTURE: Culture: NO GROWTH

## 2016-03-05 LAB — GLUCOSE, CAPILLARY
Glucose-Capillary: 298 mg/dL — ABNORMAL HIGH (ref 65–99)
Glucose-Capillary: 303 mg/dL — ABNORMAL HIGH (ref 65–99)
Glucose-Capillary: 281 mg/dL — ABNORMAL HIGH (ref 65–99)
Glucose-Capillary: 287 mg/dL — ABNORMAL HIGH (ref 65–99)
Glucose-Capillary: 303 mg/dL — ABNORMAL HIGH (ref 65–99)
Glucose-Capillary: 283 mg/dL — ABNORMAL HIGH (ref 65–99)
Glucose-Capillary: 253 mg/dL — ABNORMAL HIGH (ref 65–99)

## 2016-03-05 LAB — CBC
HCT: 31.3 % — ABNORMAL LOW (ref 36.0–46.0)
Hemoglobin: 10.2 g/dL — ABNORMAL LOW (ref 12.0–15.0)
MCH: 29.8 pg (ref 26.0–34.0)
MCHC: 32.6 g/dL (ref 30.0–36.0)
MCV: 91.5 fL (ref 78.0–100.0)
Platelets: 173 10*3/uL (ref 150–400)
RBC: 3.42 MIL/uL — ABNORMAL LOW (ref 3.87–5.11)
RDW: 15.2 % (ref 11.5–15.5)
WBC: 27.5 10*3/uL — ABNORMAL HIGH (ref 4.0–10.5)

## 2016-03-05 LAB — BASIC METABOLIC PANEL
Anion gap: 13 (ref 5–15)
BUN: 58 mg/dL — AB (ref 6–20)
CALCIUM: 8.7 mg/dL — AB (ref 8.9–10.3)
CO2: 22 mmol/L (ref 22–32)
CREATININE: 3.89 mg/dL — AB (ref 0.44–1.00)
Chloride: 98 mmol/L — ABNORMAL LOW (ref 101–111)
GFR calc non Af Amer: 10 mL/min — ABNORMAL LOW (ref >60)
GFR, EST AFRICAN AMERICAN: 12 mL/min — AB (ref >60)
GLUCOSE: 290 mg/dL — AB (ref 65–99)
Potassium: 6.3 mmol/L (ref 3.5–5.1)
Sodium: 133 mmol/L — ABNORMAL LOW (ref 135–145)

## 2016-03-05 LAB — TROPONIN I
Troponin I: 0.08 ng/mL — ABNORMAL HIGH (ref <0.031)
Troponin I: 0.07 ng/mL — ABNORMAL HIGH (ref <0.031)

## 2016-03-05 LAB — HEMOGLOBIN AND HEMATOCRIT, BLOOD
HCT: 31.8 % — ABNORMAL LOW (ref 36.0–46.0)
Hemoglobin: 10.3 g/dL — ABNORMAL LOW (ref 12.0–15.0)

## 2016-03-05 LAB — PROTIME-INR
INR: 1.9 — ABNORMAL HIGH (ref 0.00–1.49)
PROTHROMBIN TIME: 21.7 s — AB (ref 11.6–15.2)

## 2016-03-05 LAB — HEMOGLOBIN A1C
Hgb A1c MFr Bld: 7.9 % — ABNORMAL HIGH (ref 4.8–5.6)
Mean Plasma Glucose: 180 mg/dL

## 2016-03-05 MED ORDER — CETYLPYRIDINIUM CHLORIDE 0.05 % MT LIQD: 7.0000 mL | Freq: Two times a day (BID) | OROMUCOSAL | Status: DC

## 2016-03-05 MED ORDER — CHLORHEXIDINE GLUCONATE 0.12 % MT SOLN: 15.0000 mL | Freq: Two times a day (BID) | OROMUCOSAL | Status: DC

## 2016-03-05 MED ORDER — DEXTROSE 5 % IV SOLN
INTRAVENOUS | Status: DC
  Administered 2016-03-05: 17:00:00 via INTRAVENOUS
  Filled 2016-03-05: qty 150

## 2016-03-05 MED ORDER — SODIUM CHLORIDE 0.9 % IV SOLN
INTRAVENOUS | Status: DC
  Administered 2016-03-05: 2.3 [IU]/h via INTRAVENOUS
  Filled 2016-03-05: qty 2.5

## 2016-03-05 MED ORDER — FENTANYL CITRATE (PF) 100 MCG/2ML IJ SOLN: 12.5000 ug | INTRAMUSCULAR | Status: DC | PRN

## 2016-03-05 MED ORDER — SODIUM POLYSTYRENE SULFONATE 15 GM/60ML PO SUSP
30.0000 g | Freq: Once | ORAL | Status: AC
  Administered 2016-03-05: 30 g via ORAL
  Filled 2016-03-05: qty 120

## 2016-03-05 MED ORDER — INSULIN ASPART 100 UNIT/ML IV SOLN
8.0000 [IU] | Freq: Once | INTRAVENOUS | Status: AC
  Administered 2016-03-05: 8 [IU] via INTRAVENOUS

## 2016-03-05 MED ORDER — MIDAZOLAM HCL 2 MG/2ML IJ SOLN: 0.5000 mg | INTRAMUSCULAR | Status: DC | PRN

## 2016-03-05 MED ORDER — PERFLUTREN LIPID MICROSPHERE
1.0000 mL | INTRAVENOUS | Status: AC | PRN
  Administered 2016-03-05: 1 mL via INTRAVENOUS
  Filled 2016-03-05: qty 10

## 2016-03-05 MED ORDER — INSULIN REGULAR BOLUS VIA INFUSION
0.0000 [IU] | Freq: Three times a day (TID) | INTRAVENOUS | Status: DC
  Filled 2016-03-05: qty 10

## 2016-03-05 MED ORDER — DEXTROSE-NACL 5-0.45 % IV SOLN: INTRAVENOUS | Status: DC

## 2016-03-05 MED ORDER — SODIUM BICARBONATE 8.4 % IV SOLN
Freq: Once | INTRAVENOUS | Status: AC
  Administered 2016-03-05: 09:00:00 via INTRAVENOUS
  Filled 2016-03-05: qty 100

## 2016-03-05 MED ORDER — DEXTROSE 50 % IV SOLN
1.0000 | Freq: Once | INTRAVENOUS | Status: AC
  Administered 2016-03-05: 50 mL via INTRAVENOUS
  Filled 2016-03-05: qty 50

## 2016-03-05 MED ORDER — LORAZEPAM 2 MG/ML IJ SOLN: 0.5000 mg | INTRAMUSCULAR | Status: DC | PRN

## 2016-03-05 MED ORDER — SODIUM CHLORIDE 0.9 % IV SOLN
1.0000 g | Freq: Once | INTRAVENOUS | Status: AC
  Administered 2016-03-05: 1 g via INTRAVENOUS
  Filled 2016-03-05: qty 10

## 2016-03-05 MED ORDER — SODIUM CHLORIDE 0.9 % IV BOLUS (SEPSIS)
250.0000 mL | Freq: Once | INTRAVENOUS | Status: AC
  Administered 2016-03-05: 250 mL via INTRAVENOUS

## 2016-03-05 MED ORDER — PERFLUTREN LIPID MICROSPHERE
INTRAVENOUS | Status: AC
  Filled 2016-03-05: qty 10

## 2016-03-05 MED ORDER — DEXTROSE 50 % IV SOLN: 25.0000 mL | INTRAVENOUS | Status: DC | PRN

## 2016-03-05 MED ORDER — CALCIUM GLUCONATE 10 % IV SOLN
1.0000 g | Freq: Once | INTRAVENOUS | Status: AC
  Administered 2016-03-05: 1 g via INTRAVENOUS
  Filled 2016-03-05: qty 10

## 2016-03-05 MED ORDER — SODIUM CHLORIDE 0.9 % IV SOLN: INTRAVENOUS | Status: DC

## 2016-03-10 LAB — CULTURE, BLOOD (ROUTINE X 2)
CULTURE: NO GROWTH
CULTURE: NO GROWTH

## 2016-03-27 NOTE — Progress Notes (Signed)
Pt's O2 saturation was decreasing, and she was put on Venti-mask. Crackles were heard on ausculation. Pt's daughter asked for pt to be suctioned. Pt was repositioned in bed to help with breathing and to be suctioned. Pt suddenly stopped breathing, and her heart rhythm changed to asystole.  Pt passed with her family members at bedside.  Park Breed, RN and I assessed pt's heart beat, and did not hear any heart beat or breath sounds on auscultation. Pt was pronounced dead at 01-06-1808.  The chaplain was called to assist the family.

## 2016-03-27 NOTE — Progress Notes (Signed)
Pt remains anuric. Pt continues to complaint about the foley catheter. Foley catheter removed per discussion with the family to make pt comfortable. MD notified via text page

## 2016-03-27 NOTE — Progress Notes (Addendum)
PROGRESS NOTE        PATIENT DETAILS Name: [name] Age: 82 y.o. Sex: female Date of Birth: 1933/01/22 Admit Date: 02/26/2016 Admitting Physician Bonnell Public, MD [id number], MD   Brief Narrative: Patient is a 82 y.o. female with past medical history of chronic diastolic heart failure, atrial fibrillation on anticoagulation, bioprosthetic aortic valve who presented with sudden onset of severe left flank pain, CT abdomen showed a large left perirenal hematoma. Overnight has developed hypotension and significant worsening of her renal function.  Subjective: Lethargic this morning, but easily arousable. Follows commands.  Assessment/Plan: Active Problems: Perinephric hematoma: Appears to be a spontaneous left perinephric hematoma in the setting of anticoagulation. Hypotensive overnight, hemoglobin appears to be drifting down. Have consulted interventional radiology to see if patient is a candidate for embolization, await urology evaluation.In reviewing prior radiological studies, no prior structural lesions in the kidneys evident. Spoke with pharmacy, it appears her last dose of 6/7 pm-hence doubt any reversal agents are required.  Hypotension: Suspect related to hypovolemia from above. Although has leukocytosis-suspect this is reactive-no evidence of UTI on UA or pneumonia on chest x-ray-hence do not think this is sepsis. For now will continue to [facility], will need to be cautious given history of diastolic heart failure. Obtain blood cultures  Acute on chronic kidney failure stage III: Likely ATN due to hypotension, no significant urine output last night per RN-however patient is incontinent, place Foley catheter-continue with hydration. If anuria or oliguria noted, will need to consult nephrology. Discontinue Aldactone, avoid nephrotoxic agents.  Hyperkalemia: Given Kayexalate last night, repeat labs continued to show persistent hyperkalemia-will  give one dose of calcium gluconate given intermittent nonsustained wide-complex tachycardia, IV insulin with dextrose, and start on bicarbonate infusion. Aldactone discontinued.  Wide complex tachycardia: Noted last night, started on IV amiodarone. At this time, not sure if was SVT with aberrancy, atrial fibrillation or V. tach. Continue amiodarone, await cardiology evaluation.  Elevated liver enzymes: Likely shock liver, follow. Hold statins  Acute blood loss anemia: Secondary to peri-nephric hematoma, follow for now and transfuse as needed.  Minimally elevated troponins: Likely demand ischemia in the setting of hypotension. Follow  History of atrial fibrillation: Continue amiodarone for rate control, anticoagulation discontinued-await cardiology evaluation  Chronic systolic and diastolic heart failure: Does have some mild/trace edema-we'll be very cautious with IV fluids-however currently not making much urine. Lasix and Aldactone currently on hold due to hypotension.  Dementia: Appears frail/week-but seems to be following all commands, only slightly confused this morning.  Type 2 diabetes: Continue SSI-follow CBGs closely.  Addendum: CBGs persistently elevated-we'll start insulin infusion. This will help with hyperkalemia as well. Patient already on D5 with bicarbonate infusion, does not require any further IV fluids.  Morbid obesity  Palliative care:Frail 82 year old female with mild/mod dementia-on anticoagulation with Eliquis-with perinephric hematoma-now has developed hypotension, with ARF due to ATN and persistent hyperkalemia. Spoke with daughter-Stacy Selena Batten over the phone, explained potential for further deterioration, potential need for Dialysis (if no improvement in renal function). All this information is new to the family, have encouraged her to reach out to her siblings and to other family members-to see if we can delineate goals of care. For now continue full treatment  measures, will await family discussion.   DVT Prophylaxis: SCD's  Code Status: Full code  Family Communication:  Spoke  with daughter-Stacy Selena Batten over the phone  Disposition Plan: Remain inpatient-remain in SDU  Antimicrobial agents: None  Procedures: None  CONSULTS:  cardiology, nephrology and urology   IR  Time spent: 55 minutes-Greater than 50% of this time was spent in counseling, explanation of diagnosis, planning of further management, and coordination of care.  MEDICATIONS: Anti-infectives    None      Scheduled Meds: . cholecalciferol  2,000 Units Oral Daily  . docusate sodium  100 mg Oral BID  . donepezil  10 mg Oral QHS  . DULoxetine  60 mg Oral Daily  . ferrous sulfate  325 mg Oral BID WC  . gabapentin  600 mg Oral TID  . guaiFENesin  600 mg Oral Daily  . insulin aspart  0-15 Units Subcutaneous TID WC  . insulin aspart  0-5 Units Subcutaneous QHS  . lactobacillus  1 g Oral Daily  . loratadine  10 mg Oral Daily  . multivitamin with minerals  1 tablet Oral Daily  . omega-3 acid ethyl esters  1 g Oral Daily  . senna  2 tablet Oral Daily  . simvastatin  10 mg Oral q1800   Continuous Infusions: . amiodarone Stopped (2016-03-23 0207)   PRN Meds:.acetaminophen, albuterol, fentaNYL (SUBLIMAZE) injection   PHYSICAL EXAM: Vital signs: Filed Vitals:   03/23/2016 0600 23-Mar-2016 0630 2016/03/23 0654 2016/03/23 0700  BP: 56/14 102/31  94/26  Pulse: 61 62  60  Temp:   98.4 F (36.9 C)   TempSrc:   Axillary   Resp: 12 18  18   Height:      Weight:      SpO2: 100% 100%  100%   Filed Weights   03/09/2016 0842  Weight: 115.5 kg (254 lb 10.1 oz)   Body mass index is 45.8 kg/(m^2).   Gen Exam: AwakeBut lethargic. Answers all my commands. Slow speech.  Neck: Supple Chest: Mostly clear to auscultation-few bibasilar rales.  CVS: S1 S2 Regular, no murmurs.  Abdomen: soft, BS +, non tender, non distended.  Extremities: + edema, lower extremities warm to  touch. Neurologic: Non Focal-but with generalized weakness Skin: No Rash or lesions   Wounds: N/A.   LABORATORY DATA: CBC:  Recent Labs Lab 03/19/2016 0459 03/20/2016 1057 03/12/2016 1825 03-23-16 0047 March 23, 2016 0606  WBC 18.5*  --   --   --  27.5*  NEUTROABS 15.3*  --   --   --   --   HGB 11.9* 11.0* 10.3* 10.3* 10.2*  HCT 35.6* 33.9* 32.0* 31.8* 31.3*  MCV 88.3  --   --   --  91.5  PLT 120*  --   --   --  [id number]    Basic Metabolic Panel:  Recent Labs Lab 03/18/2016 0459 03/02/2016 1830 2016/03/23 0047  NA 135 133* 133*  K 4.4 5.9* 6.7*  CL 101 99* 99*  CO2 25 18* 24  GLUCOSE 330* 464* 348*  BUN 30* 39* 44*  CREATININE 1.59* 2.63* 3.14*  CALCIUM 8.7* 8.5* 8.6*  MG  --  2.2  --   PHOS  --  6.9* 7.1*    GFR: Estimated Creatinine Clearance: 17.4 mL/min (by C-G formula based on Cr of 3.14).  Liver Function Tests:  Recent Labs Lab 02/27/2016 0459 03/26/2016 1830 2016-03-23 0047  AST 26  --   --   ALT 16  --   --   ALKPHOS 90  --   --   BILITOT 0.8  --   --   PROT  6.7  --   --   ALBUMIN 3.5 3.4* 3.6    Recent Labs Lab 03/19/2016 0459  LIPASE 32   No results for input(s): AMMONIA in the last 168 hours.  Coagulation Profile: No results for input(s): INR, PROTIME in the last 168 hours.  Cardiac Enzymes:  Recent Labs Lab 03/21/2016 1830 03/10/16 0047 03-10-16 0606  TROPONINI 0.05* 0.08* 0.07*    BNP (last 3 results) No results for input(s): PROBNP in the last 8760 hours.  HbA1C:  Recent Labs  03/16/2016 1057  HGBA1C 7.9*    CBG:  Recent Labs Lab 02/28/2016 1125 03/25/2016 1545 02/27/2016 2123 02/28/2016 2213  GLUCAP 400* 388* 402* 407*    Lipid Profile: No results for input(s): CHOL, HDL, LDLCALC, TRIG, CHOLHDL, LDLDIRECT in the last 72 hours.  Thyroid Function Tests: No results for input(s): TSH, T4TOTAL, FREET4, T3FREE, THYROIDAB in the last 72 hours.  Anemia Panel: No results for input(s): VITAMINB12, FOLATE, FERRITIN, TIBC, IRON, RETICCTPCT in the  last 72 hours.  Urine analysis:    Component Value Date/Time   COLORURINE YELLOW 03/02/2016 0536   APPEARANCEUR CLEAR 03/03/2016 0536   LABSPEC 1.020 03/13/2016 0536   PHURINE 5.5 03/11/2016 0536   GLUCOSEU 250* 03/03/2016 0536   HGBUR NEGATIVE 03/08/2016 0536   BILIRUBINUR NEGATIVE 03/25/2016 0536   KETONESUR NEGATIVE 03/16/2016 0536   PROTEINUR NEGATIVE 03/12/2016 0536   UROBILINOGEN 0.2 02/12/2014 2035   NITRITE NEGATIVE 03/22/2016 0536   LEUKOCYTESUR NEGATIVE 02/26/2016 0536    Sepsis Labs: Lactic Acid, Venous    Component Value Date/Time   LATICACIDVEN 2.44* 03/20/2016 0517    MICROBIOLOGY: Recent Results (from the past 240 hour(s))  MRSA PCR Screening     Status: None   Collection Time: 03/24/2016  8:44 AM  Result Value Ref Range Status   MRSA by PCR NEGATIVE NEGATIVE Final    Comment:        The GeneXpert MRSA Assay (FDA approved for NASAL specimens only), is one component of a comprehensive MRSA colonization surveillance program. It is not intended to diagnose MRSA infection nor to guide or monitor treatment for MRSA infections.     RADIOLOGY STUDIES/RESULTS: Dg Chest Port 1 View  03-10-16  CLINICAL DATA:  CHF.  Shortness of breath EXAM: PORTABLE CHEST 1 VIEW COMPARISON:  Yesterday FINDINGS: Chronic cardiomegaly. The patient is status post aortic valve replacement. Dual-chamber pacer leads from the left. Chronic scarring on the left with linear opacities. Bronchitic or congestive interstitial coarsening. No definitive pleural effusion. No pneumothorax. IMPRESSION: 1. Congestive or bronchitic interstitial coarsening similar to yesterday. 2. Left perihilar atelectasis. Electronically Signed   By: Monte Fantasia M.D.   On: 03-10-16 06:00   Dg Chest Portable 1 View  03/02/2016  CLINICAL DATA:  Shortness of breath EXAM: PORTABLE CHEST 1 VIEW COMPARISON:  02/12/2014 FINDINGS: Chronic cardiopericardial enlargement. Dual-chamber pacer leads from the left are in  stable position. Status post median sternotomy for aortic valve replacement. Prominent mitral valve calcification. Fracturing of upper sternal wires is chronic. Interstitial coarsening above previous baseline. No effusion or pneumothorax. Stable appearance of the hila. IMPRESSION: 1. COPD with bronchitic markings accentuated by low volumes and atelectasis. 2. Chronic cardiomegaly. Electronically Signed   By: Monte Fantasia M.D.   On: 03/18/2016 06:22   Ct Renal Stone Study  03/04/2016  CLINICAL DATA:  Acute onset of back pain radiating to the abdomen. EXAM: CT ABDOMEN AND PELVIS WITHOUT CONTRAST TECHNIQUE: Multidetector CT imaging of the abdomen and pelvis was performed  following the standard protocol without IV contrast. COMPARISON:  Pelvis CT 02/13/2014 FINDINGS: Lower chest and abdominal wall: Bronchial wall thickening seen at the bases. Trace left effusion. Hepatobiliary: Cirrhotic liver without gross mass lesion. Cholecystectomy. Pancreas: Atrophy without acute finding. Spleen: Small volume fluid around the spleen, likely reactive to the neighboring hemorrhage. Adrenals/Urinary Tract:  Negative adrenals. Large subcapsular hematoma compressing the left renal cortex. The hematoma encompasses the entire kidney and measures up to 5 cm in thickness. There is extension into the perinephric spaces dissecting into the left pelvis, sigmoid mesentery and splenorenal ligament. When allowing for clot layering, no definitive mass lesion is seen. No hydronephrosis. Atrophic bilateral kidney. Partially collapsed bladder. Reproductive:No pathologic findings. Stomach/Bowel: No obstruction. Appendectomy. Distal colonic diverticulosis. Vascular/Lymphatic: Extensive atherosclerosis. Flat IVC which may be from intravascular depletion. No mass or adenopathy. Peritoneal: No ascites or pneumoperitoneum. Musculoskeletal: Osteopenia and extensive spondylosis and disc degeneration. No acute finding. Critical Value/emergent results  were called by telephone at the time of interpretation on 03/07/2016 at 6:42 am to Dr. Thayer Jew , who verbally acknowledged these results. IMPRESSION: 1. Large left subcapsular and perinephric hematoma dissecting into the retroperitoneal ligaments and left pelvis. In the absence of trauma, this could be spontaneous or from obscured renal mass. 2. Intravascular depletion with collapsed IVC. 3. Cirrhosis. Electronically Signed   By: Monte Fantasia M.D.   On: 03/09/2016 06:50     LOS: 1 day   Oren Binet, MD  [facility] Pager:[phone]  If 7PM-7AM, please contact night-coverage [email or web address] Password [email or web address] Mar 15, 2016, 7:45 AM

## 2016-03-27 NOTE — Progress Notes (Signed)
CRITICAL VALUE ALERT  Critical value received: K= 6.6  Date of notification:  2016/03/07  Time of notification:  0806  Critical value read back:Yes.    Nurse who received alert:  Virgin Zellers, RN  MD notified (1st page):  Dr. Sloan Leiter  Time of first page:  Verbally notified at [phone]

## 2016-03-27 NOTE — Progress Notes (Signed)
CRITICAL VALUE ALERT  Critical value received:  K 6.7  Date of notification:  03/11/2016  Time of notification:  0115  Critical value read back: Yes  Nurse who received alert:  Gae Gallop, RN  MD notified (1st page):  Fredirick Maudlin  Time of first page:  0120  MD notified (2nd page):  Time of second page:  Responding MD:  Fredirick Maudlin  Time MD responded:  [phone]

## 2016-03-27 NOTE — Progress Notes (Signed)
Patient was reevaluated this afternoon Daughter and other family members at bedside I had briefly spoken with-patient's daughter-[name] over the phone this morning Family has a chance to talk with other subspecialists, and among themselves. Family members had already decided that they did not want to pursue dialysis after talking with Dr. Joelyn Oms. I explained that patient was very frail, and would likely not tolerate a lot of aggressive care. After further discussion, family was agreeable to DO NOT RESUSCITATE status. For now, we agreed-that-we-will-continue-with-current-measures-but it any further deterioration, family agreeable to transition to full comfort measures. Unfortunately, patient is anuric-potassium is persistently elevated-and remains on a bicarbonate infusion and insulin infusion. I have updated the nursing staff.

## 2016-03-27 NOTE — Progress Notes (Signed)
*  PRELIMINARY RESULTS* Echocardiogram 2D Echocardiogram has been performed.  Leavy Cella Mar 20, 2016, 1:39 PM

## 2016-03-27 NOTE — Discharge Summary (Signed)
Death Summary  [name] [facility] [id number] DOB: Jun 30, 1933 DOA: 2016-03-28  PCP: Wenda Low, MD  Admit date: 28-Mar-2016 Date of Death: March 29, 2016  Final Diagnoses:  Principal Problem:   Perinephric hematoma Active Problems:   OBESITY, MORBID   HYPERTENSION, CONTROLLED   S/P CABG x 1 2009   S/P AVR (tissue) 2009   Acute blood loss anemia   DM (diabetes mellitus) (HCC)   Hyperkalemia   Chronic diastolic heart failure (HCC)   Wide-complex tachycardia (HCC)   Acute renal failure (HCC)   Hyperkalemia   Cardiac pacemaker in situ   Chronic anticoagulation-Eliquis 2.5 MG BID   PAF- CHADs VASc=6. NSR Jan 2017  Brief summary  Patient is a 82 y.o. female with past medical history of chronic diastolic heart failure, atrial fibrillation on anticoagulation, bioprosthetic aortic valve who presented with sudden onset of severe left flank pain, CT abdomen showed a large left perirenal hematoma. Hospital course was was complicated by development of hypotension and worsening of renal failure. After long discussion with family, patient was made a DO NOT RESUSCITATE, family elected not to pursue hemodialysis. Family was aware of very poor prognoses given a very poor state of health and frailty. Plans were to continue to monitor and see how she did with supportive measures, if no improvement, family was willing to transition to full comfort measures.Unfortunately patient continued to deteriorate rather rapidly and subsequently expired on 03-29-2016 at 1809 hrs. This M.D., spoke with family and offered condolences. Please see below for details regarding hospital course.  Hospital Course by problem list:  Perinephric hematoma: Appears to be a spontaneous left perinephric hematoma in the setting of anticoagulation. Patient unfortunately developed hypotension post admission, hemoglobin drifted down only slightly. Nephrology, urology, and interventional radiology were following. Plans were to continue with supportive  care, because of worsening renal function, embolization was felt to be too high risk in this situation-especially when hemoglobin had only drifted down slightly.   Hypovolemic shock: Suspect related to hypovolemia from above. Although has leukocytosis-suspect this was reactive-no evidence of UTI on UA or pneumonia on chest x-ray-hence do not think this is sepsis. Plans were to continue to hydrate and follow cultures. She was monitored off antibiotics  Acute on chronic kidney failure stage III: Likely ATN due to hypotension, she also developed anuria.Nephrology was consulted, dialysis was contemplated, after multiple discussions with family, family elected not to pursue aggressive care and did not want to pursue dialysis. She unfortunately continued to have worsening renal function, her last creatinine was 3.89. Creatinine on admission was 1.59.  Hyperkalemia: Likely secondary to above, hospital course was complicated by persistent hyperkalemia. She was given Kayexalate last night. This morning, she was given insulin intravenously, she was subsequently placed on IV bicarbonate infusion and IV insulin, she unfortunately continued to have persistent hyperkalemia.   Wide complex tachycardia: Noted last night, started on IV amiodarone at the recommendation of cardiology. Cardiology was consulted during this hospital stay.  Elevated liver enzymes: Likely shock liver, plans were to follow. Statin was placed on hold  Acute blood loss anemia: Secondary to peri-nephric hematoma, plans were to follow and transfuse as needed.  Minimally elevated troponins: Likely demand ischemia in the setting of hypotension. Plans were to follow   History of atrial fibrillation: She was managed with intravenous amiodarone, anticoagulation was discontinued because of perinephric hematoma.   Chronic systolic and diastolic heart failure: Did have some mild edema but otherwise was compensated. All diuretics were on hold due to  hypotension and  worsening renal function.   Dementia: Patient appeared frail/week-but seems to be following all commands, only slightly confused this morning.  Type 2 diabetes: CBGs were uncontrolled, she was managed with intravenous insulin infusion.   SignedOren Binet  [facility] 03/22/16, 6:32 PM

## 2016-03-27 NOTE — Consult Note (Signed)
[name] [facility] Admit Date: 03/19/2016 Mar 19, 2016 [name] Requesting Physician:  Sloan Leiter  Reason for Consult:  AoCKD3, Hyperkalemia, L perinephric renal hemorrhage HPI:  81F seen at request of Dr. Sloan Leiter for above issues. She has some dementia and lives a nursing facility but is engaged with social activities and have been doing well. Pertinent past history includes atrial fibrillation, history of bioprosthetic aortic valve replacement in 2009, history of CABG in 2009, type 2 diabetes, morbid obesity, hypertension. She presented to the emergency room on 6/8 with back and groin pain, acute onset. She was found to have a left perinephric renal hemorrhage with compression of the left kidney. She takes apixaban for her atrial fibrillation. She has been seen by urology as well as interventional radiology and is currently following conservative close monitoring. Overnight she developed significant hypotension with systolic blood pressure in the 50s.  Presenting creatinine was 1.59 and it appears that she has some mild CKD but does not see a nephrologist in the community. No exposure to IV contrast. Home medications include furosemide twice daily, spironolactone, potassium chloride; all of which have been held. This morning for her hyperkalemia she has received 30 g of Kayexalate, insulin/dextrose, and sodium bicarbonate and is currently on an infusion of D5W and sodium bicarbonate. She is anuric and a Foley catheter has been placed.  Labs today and reviewed in their entirety. Of note her potassium is 6.6 and was normal upon presentation. Her creatinine was 3.5, 1.59 upon presentation. She is developing transaminitis. Hemoglobin is 10.2 with a presenting hemoglobin of 11.9.  Numerous family members are present in the room. They uniformly expressed clearly that they would not 1 her to suffer in any extraordinary way. We discussed at length her current severe acute conditions with her numerous  comorbidities. Ask reasonable questions about expectations for dialysis and what benefit it could provide.   CREAT (mg/dL)  Date Value  10/20/2015 1.22*   CREATININE, SER (mg/dL)  Date Value  03-19-16 3.54*  Mar 19, 2016 3.14*  03/14/2016 2.63*  03/18/2016 1.59*  02/12/2014 1.55*  12/03/2013 1.4*  10/02/2013 1.5*  09/15/2013 1.07  09/14/2013 1.13*  09/13/2013 1.34*  ] I/Os: I/O last 3 completed shifts: In: 1904.6 [I.V.:1154.6; Other:500; IV Piggyback:250] Out: 100 [Urine:100]  ROS Balance of 12 systems is negative w/ exceptions as above  PMH  Past Medical History  Diagnosis Date  . Coronary artery disease 09/04/08    single vessel CABG at time of AVR   . Aortic stenosis 09/04/08    s/p AVR by Dr Servando Snare (tissue valve)  . Paroxysmal atrial fibrillation (HCC)     remote history of PAF  . LBBB (left bundle branch block)   . CHF (congestive heart failure) (HCC)     diastolic  . OSA (obstructive sleep apnea)   . Alzheimer's dementia   . Diabetes mellitus     type 2  . Hypertension   . Chronic anemia   . Osteoporosis   . Chronic renal failure   . Morbid obesity with BMI of 50.0-59.9, adult ([place]) 03/09/2012  . Peripheral vascular disease ([place])   . Arthritis   . Chronic diastolic heart failure ([place]) 07/20/2013  . Chronic right ear pain     "nerve damage"  . Atrial flutter ([place])   . Anemia in chronic kidney disease   . Major depressive disorder ([place])   . Alopecia   . Benign hypertensive kidney disease with chronic kidney disease stage I through stage IV, or unspecified   .  Type II or unspecified type diabetes mellitus with neurological manifestations, not stated as uncontrolled   . Pure hypercholesterolemia   . Coronary atherosclerosis of native coronary artery   . Obesity   . GI bleed 08/2013  . Sick sinus syndrome ([place])     s/p PPM   PSH  Past Surgical History  Procedure Laterality Date  . Coronary artery bypass graft  09/04/08    single vessel at time of  AVR  . Aortic valve replacement  12/09    tissue valve by Dr Servando Snare  . Cholecystectomy  1999  . Cataract extraction  2002  . Replacement total knee bilateral      L 2000, R 1997  . Appendectomy    . Closed reduction femoral shaft fracture  2007    R intramedullary rod  . Tee without cardioversion N/A 02/07/2013    Procedure: TRANSESOPHAGEAL ECHOCARDIOGRAM (TEE);  Surgeon: Candee Furbish, MD;  Location: [facility] ENDOSCOPY;  Service: Cardiovascular;  Laterality: N/A;  . Pacemaker insertion  09/14/2013    MDT Adapta L implanted by Dr Lovena Le for SSS  . Permanent pacemaker insertion N/A 09/14/2013    Procedure: PERMANENT PACEMAKER INSERTION;  Surgeon: Evans Lance, MD;  Location: [facility] CATH LAB;  Service: Cardiovascular;  Laterality: N/A;   FH  Family History  Problem Relation Age of Onset  . Heart attack Father 74    multiple MIs before death  . Diabetes Father   . CAD Father   . Lung cancer Mother 36  . Colon cancer Mother   . Hypertension Mother   . Hypertension Sister   . Multiple sclerosis Brother   . Diabetes Sister    SH  reports that she has never smoked. She has never used smokeless tobacco. She reports that she does not drink alcohol or use illicit drugs. Allergies  Allergies  Allergen Reactions  . Morphine And Related Other (See Comments)    "went crazy"-Hallucinations  . Celebrex [Celecoxib] Other (See Comments)    Sore mouth  . Codeine Other (See Comments)    Altered mental status  . Oxycodone Other (See Comments)    Altered mental status  . Sulfa Antibiotics Itching    Itching all over body  . Wellbutrin [Bupropion] Other (See Comments)    Hallucinations   Home medications Prior to Admission medications   Medication Sig Start Date End Date Taking? Authorizing Provider  acetaminophen (TYLENOL) 500 MG tablet Take 500 mg by mouth every 8 (eight) hours as needed for mild pain.   Yes Historical Provider, MD  albuterol (PROVENTIL HFA;VENTOLIN HFA) 108 (90 BASE) MCG/ACT  inhaler Inhale 2 puffs into the lungs every 6 (six) hours as needed for wheezing. 07/26/13  Yes Belkys A Regalado, MD  amiodarone (PACERONE) 200 MG tablet Take 100 mg by mouth 2 (two) times daily.   Yes Historical Provider, MD  amoxicillin (AMOXIL) 500 MG tablet Take 2,000 mg by mouth as directed. Take 1 hour before dental procedures   Yes Historical Provider, MD  apixaban (ELIQUIS) 5 MG TABS tablet Take 5 mg by mouth 2 (two) times daily.   Yes Historical Provider, MD  cetirizine (ZYRTEC) 10 MG tablet Take 10 mg by mouth daily.   Yes Historical Provider, MD  docusate sodium (COLACE) 100 MG capsule Take 100 mg by mouth 2 (two) times daily.   Yes Historical Provider, MD  donepezil (ARICEPT) 10 MG tablet Take 1 tablet (10 mg total) by mouth at bedtime. 08/11/12  Yes Darl Pikes, MD  DULoxetine (CYMBALTA) 60 MG capsule Take 60 mg by mouth daily.   Yes Historical Provider, MD  ferrous sulfate 325 (65 FE) MG tablet Take 1 tablet (325 mg total) by mouth 2 (two) times daily. 08/11/12  Yes Jacquelyn A McGill, MD  fish oil-omega-3 fatty acids 1000 MG capsule Take 1 g by mouth daily. 08/04/12  Yes Theodis Blaze, MD  furosemide (LASIX) 80 MG tablet Take 80 mg by mouth 2 (two) times daily.    Yes Historical Provider, MD  gabapentin (NEURONTIN) 600 MG tablet Take 600 mg by mouth 3 (three) times daily.  08/11/12  Yes Jacquelyn A McGill, MD  guaiFENesin (MUCINEX) 600 MG 12 hr tablet Take 600 mg by mouth daily.   Yes Historical Provider, MD  insulin aspart (NOVOLOG) 100 UNIT/ML injection Inject 2-9 Units into the skin 3 (three) times daily with meals. Sliding scale 151-200=2  201-250=3 251-300=5 301-350=7 >350=9   Yes Historical Provider, MD  insulin detemir (LEVEMIR) 100 UNIT/ML injection Inject 24 Units into the skin at bedtime.   Yes Historical Provider, MD  lactobacillus (FLORANEX/LACTINEX) PACK Take 1 g by mouth daily.   Yes Historical Provider, MD  Melatonin 5 MG TABS Take 5 mg by mouth at bedtime as  needed (insomnia).   Yes Historical Provider, MD  Multiple Vitamin (MULTIVITAMIN) tablet Take 1 tablet by mouth daily.    Yes Historical Provider, MD  potassium chloride SA (K-DUR,KLOR-CON) 20 MEQ tablet Take 20 mEq by mouth 3 (three) times daily.   Yes Historical Provider, MD  PRESCRIPTION MEDICATION Apply 1 application topically at bedtime. Capsacin Cream- to both feet.   Yes Historical Provider, MD  senna (SENOKOT) 8.6 MG tablet Take 2 tablets by mouth daily.   Yes Historical Provider, MD  simvastatin (ZOCOR) 10 MG tablet Take 10 mg by mouth daily at 6 PM.   Yes Historical Provider, MD  spironolactone (ALDACTONE) 12.5 mg TABS tablet Take 0.5 tablets (12.5 mg total) by mouth 2 (two) times daily. 08/20/13  Yes Jerline Pain, MD  traMADol (ULTRAM-ER) 100 MG 24 hr tablet Take 100 mg by mouth at bedtime as needed.    Yes Historical Provider, MD  Vitamin D, Cholecalciferol, 1000 units TABS Take 2,000 Units by mouth daily.   Yes Historical Provider, MD    Current Medications Scheduled Meds: . cholecalciferol  2,000 Units Oral Daily  . docusate sodium  100 mg Oral BID  . donepezil  10 mg Oral QHS  . DULoxetine  60 mg Oral Daily  . guaiFENesin  600 mg Oral Daily  . insulin regular  0-10 Units Intravenous TID WC  . lactobacillus  1 g Oral Daily  . loratadine  10 mg Oral Daily  . multivitamin with minerals  1 tablet Oral Daily  . omega-3 acid ethyl esters  1 g Oral Daily  . senna  2 tablet Oral Daily  .  sodium bicarbonate  infusion 1000 mL   Intravenous Once   Continuous Infusions: . sodium chloride    . amiodarone 30 mg/hr (03-15-16 0811)  . dextrose 5 % and 0.45% NaCl    . insulin (NOVOLIN-R) infusion    .  sodium bicarbonate  infusion 1000 mL     PRN Meds:.acetaminophen, albuterol, dextrose, fentaNYL (SUBLIMAZE) injection  CBC  Recent Labs Lab 03/21/2016 0459  03/06/2016 1825 03/15/16 0047 03-15-2016 0606  WBC 18.5*  --   --   --  27.5*  NEUTROABS 15.3*  --   --   --   --  HGB  11.9*  < > 10.3* 10.3* 10.2*  HCT 35.6*  < > 32.0* 31.8* 31.3*  MCV 88.3  --   --   --  91.5  PLT 120*  --   --   --  173  < > = values in this interval not displayed. Basic Metabolic Panel  Recent Labs Lab 03/11/2016 0459 02/28/2016 1830 03-09-16 0047 03/09/16 0606  NA 135 133* 133* 134*  K 4.4 5.9* 6.7* 6.6*  CL 101 99* 99* 99*  CO2 25 18* 24 23  GLUCOSE 330* 464* 348* 294*  BUN 30* 39* 44* 49*  CREATININE 1.59* 2.63* 3.14* 3.54*  CALCIUM 8.7* 8.5* 8.6* 8.8*  PHOS  --  6.9* 7.1*  --     Physical Exam  Blood pressure 107/32, pulse 62, temperature 98.4 F (36.9 C), temperature source Axillary, resp. rate 15, height 5' 2.5" (1.588 m), weight 115.5 kg (254 lb 10.1 oz), SpO2 91 %. GEN: somonlent, awakens to voice, oriented to self ENT: NCAT EYES: EOMI CV: RRR PULM: CTAB ant ABD: s/nt/nd SKIN: no rashes/lesions EXT:No LEE GU: Foley cath present   Assessment 55F with dementia admit with spont L perinephric hemorrhage on NOAC for pAFib and anuric AoCKD3 with hyperkalemia  1. AoCKD3 2. Hyperkalemia 3. Spont L Perinephric Hemorrage 4. Hypotension 5. pAF on NOAC 6. Anemia 7. Dementia, SNF resident 8. Progressive Transaminitis, cirrhotic appearance of liver on imaging 9. CAD hx/o CABG 2009 10. Hx/o BP AVF 2009 11. HTN 12. Obesity 13. Hyperphosphatemia  Plan 1. Agree with hydration with alkali and medial therapy of hyperkalemia 2. Repeat BMP already ordered 3. Begain conversation about realistic expectations of short/long term HD prior to initiation 4. Cont to hold diuretics, KCl Daily weights, Daily Renal Panel, Strict I/Os, Avoid nephrotoxins (NSAIDs, judicious IV Contrast)   Pearson Grippe MD [phone] pgr 2016/03/09, 11:29 AM

## 2016-03-27 NOTE — Progress Notes (Signed)
Inpatient Diabetes Program Recommendations  AACE/ADA: New Consensus Statement on Inpatient Glycemic Control (2015)  Target Ranges:  Prepandial:   less than 140 mg/dL      Peak postprandial:   less than 180 mg/dL (1-2 hours)      Critically ill patients:  140 - 180 mg/dL   Lab Results  Component Value Date   GLUCAP 303* 2016/03/14   HGBA1C 7.9* 03/02/2016   Results for BRAILEY, BUESCHER (MRN [id number]) as of 03-14-2016 10:07  Ref. Range 03/14/2016 06:06  Sodium Latest Ref Range: 135-145 mmol/L 134 (L)  Potassium Latest Ref Range: 3.5-5.1 mmol/L 6.6 (HH)  Chloride Latest Ref Range: 101-111 mmol/L 99 (L)  CO2 Latest Ref Range: 22-32 mmol/L 23  BUN Latest Ref Range: 6-20 mg/dL 49 (H)  Creatinine Latest Ref Range: 0.44-1.00 mg/dL 3.54 (H)  Calcium Latest Ref Range: 8.9-10.3 mg/dL 8.8 (L)  EGFR (Non-African Amer.) Latest Ref Range: >60 mL/min 11 (L)  EGFR (African American) Latest Ref Range: >60 mL/min 13 (L)  Glucose Latest Ref Range: 65-99 mg/dL 294 (H)  Anion gap Latest Ref Range: 5-15  12   Review of Glycemic Control  Diabetes history: DM2 Outpatient Diabetes medications: Levemir 24 units QHS, Novolog 2-9 units tidwc Current orders for Inpatient glycemic control: Novolog moderate tidwc and hs  Uncontrolled blood sugars with hyperkalemia. Has received no basal insulin since admission. Needs IV insulin.  Inpatient Diabetes Program Recommendations:    IV insulin per GlucoStabilizer. Recommend ICU Hyperglycemia Protocol.  Will follow. Thank you. Lorenda Peck, RD, LDN, CDE Inpatient Diabetes Coordinator [phone]

## 2016-03-27 NOTE — Consult Note (Signed)
Reason for Consult:   WCT  Requesting Physician: [facility] Primary Cardiologist Dr Marlou Porch Dr Rayann Heman  HPI:   82 y/o morbidly obese female (BMI 46) who currently resides in a SNF but is socially active according to pt's daughter, with a history of CABG x 1 and tissue AVR in 2009, PAF with SSS s/p MDT PTVDP in Dec 2014 and LBBB. She was holding NSR on Amiodarone according to pacemaker check in Jan 2017. She is on Eliquis low dose secondary to past GI bleeding issues. Echo in 2014 showed an EF of 45-50%.             She was admitted 03/17/2016 with a spontaneous perinephric bleed with hypotension, acute renal injury, and hyperkalemia (K+ went to 6.7). She was stabilized and her K+ treated. She had a run of WCT on telemetry that converted spontaneously- ? VT vs AF with LBBB. She is now paced on IV Amiodarone.   PMHx:  Past Medical History  Diagnosis Date  . Coronary artery disease 09/04/08    single vessel CABG at time of AVR   . Aortic stenosis 09/04/08    s/p AVR by Dr Servando Snare (tissue valve)  . Paroxysmal atrial fibrillation (HCC)     remote history of PAF  . LBBB (left bundle branch block)   . CHF (congestive heart failure) (HCC)     diastolic  . OSA (obstructive sleep apnea)   . Alzheimer's dementia   . Diabetes mellitus     type 2  . Hypertension   . Chronic anemia   . Osteoporosis   . Chronic renal failure   . Morbid obesity with BMI of 50.0-59.9, adult ([place]) 03/09/2012  . Peripheral vascular disease ([place])   . Arthritis   . Chronic diastolic heart failure ([place]) 07/20/2013  . Chronic right ear pain     "nerve damage"  . Atrial flutter ([place])   . Anemia in chronic kidney disease   . Major depressive disorder ([place])   . Alopecia   . Benign hypertensive kidney disease with chronic kidney disease stage I through stage IV, or unspecified   . Type II or unspecified type diabetes mellitus with neurological manifestations, not stated as uncontrolled   . Pure  hypercholesterolemia   . Coronary atherosclerosis of native coronary artery   . Obesity   . GI bleed 08/2013  . Sick sinus syndrome ([place])     s/p PPM    Past Surgical History  Procedure Laterality Date  . Coronary artery bypass graft  09/04/08    single vessel at time of AVR  . Aortic valve replacement  12/09    tissue valve by Dr Servando Snare  . Cholecystectomy  1999  . Cataract extraction  2002  . Replacement total knee bilateral      L 2000, R 1997  . Appendectomy    . Closed reduction femoral shaft fracture  2007    R intramedullary rod  . Tee without cardioversion N/A 02/07/2013    Procedure: TRANSESOPHAGEAL ECHOCARDIOGRAM (TEE);  Surgeon: Candee Furbish, MD;  Location[facility] ENDOSCOPY;  Service: Cardiovascular;  Laterality: N/A;  . Pacemaker insertion  09/14/2013    MDT Adapta L implanted by Dr Lovena Le for SSS  . Permanent pacemaker insertion N/A 09/14/2013    Procedure: PERMANENT PACEMAKER INSERTION;  Surgeon: Evans Lance, MD;  Location: [facility] CATH LAB;  Service: Cardiovascular;  Laterality: N/A;    SOCHx:  reports that she has never smoked.  She has never used smokeless tobacco. She reports that she does not drink alcohol or use illicit drugs.  FAMHx: Family History  Problem Relation Age of Onset  . Heart attack Father 60    multiple MIs before death  . Diabetes Father   . CAD Father   . Lung cancer Mother 25  . Colon cancer Mother   . Hypertension Mother   . Hypertension Sister   . Multiple sclerosis Brother   . Diabetes Sister     ALLERGIES: Allergies  Allergen Reactions  . Morphine And Related Other (See Comments)    "went crazy"-Hallucinations  . Celebrex [Celecoxib] Other (See Comments)    Sore mouth  . Codeine Other (See Comments)    Altered mental status  . Oxycodone Other (See Comments)    Altered mental status  . Sulfa Antibiotics Itching    Itching all over body  . Wellbutrin [Bupropion] Other (See Comments)    Hallucinations    ROS: Review of  Systems: Unobtainable as pt is lethargic, no able to give an history  HOME MEDICATIONS: Prior to Admission medications   Medication Sig Start Date End Date Taking? Authorizing Provider  acetaminophen (TYLENOL) 500 MG tablet Take 500 mg by mouth every 8 (eight) hours as needed for mild pain.   Yes Historical Provider, MD  albuterol (PROVENTIL HFA;VENTOLIN HFA) 108 (90 BASE) MCG/ACT inhaler Inhale 2 puffs into the lungs every 6 (six) hours as needed for wheezing. 07/26/13  Yes Belkys A Regalado, MD  amiodarone (PACERONE) 200 MG tablet Take 100 mg by mouth 2 (two) times daily.   Yes Historical Provider, MD  amoxicillin (AMOXIL) 500 MG tablet Take 2,000 mg by mouth as directed. Take 1 hour before dental procedures   Yes Historical Provider, MD  apixaban (ELIQUIS) 5 MG TABS tablet Take 5 mg by mouth 2 (two) times daily.   Yes Historical Provider, MD  cetirizine (ZYRTEC) 10 MG tablet Take 10 mg by mouth daily.   Yes Historical Provider, MD  docusate sodium (COLACE) 100 MG capsule Take 100 mg by mouth 2 (two) times daily.   Yes Historical Provider, MD  donepezil (ARICEPT) 10 MG tablet Take 1 tablet (10 mg total) by mouth at bedtime. 08/11/12  Yes Jacquelyn A McGill, MD  DULoxetine (CYMBALTA) 60 MG capsule Take 60 mg by mouth daily.   Yes Historical Provider, MD  ferrous sulfate 325 (65 FE) MG tablet Take 1 tablet (325 mg total) by mouth 2 (two) times daily. 08/11/12  Yes Jacquelyn A McGill, MD  fish oil-omega-3 fatty acids 1000 MG capsule Take 1 g by mouth daily. 08/04/12  Yes Theodis Blaze, MD  furosemide (LASIX) 80 MG tablet Take 80 mg by mouth 2 (two) times daily.    Yes Historical Provider, MD  gabapentin (NEURONTIN) 600 MG tablet Take 600 mg by mouth 3 (three) times daily.  08/11/12  Yes Jacquelyn A McGill, MD  guaiFENesin (MUCINEX) 600 MG 12 hr tablet Take 600 mg by mouth daily.   Yes Historical Provider, MD  insulin aspart (NOVOLOG) 100 UNIT/ML injection Inject 2-9 Units into the skin 3 (three)  times daily with meals. Sliding scale 151-200=2  201-250=3 251-300=5 301-350=7 >350=9   Yes Historical Provider, MD  insulin detemir (LEVEMIR) 100 UNIT/ML injection Inject 24 Units into the skin at bedtime.   Yes Historical Provider, MD  lactobacillus (FLORANEX/LACTINEX) PACK Take 1 g by mouth daily.   Yes Historical Provider, MD  Melatonin 5 MG TABS Take 5 mg by  mouth at bedtime as needed (insomnia).   Yes Historical Provider, MD  Multiple Vitamin (MULTIVITAMIN) tablet Take 1 tablet by mouth daily.    Yes Historical Provider, MD  potassium chloride SA (K-DUR,KLOR-CON) 20 MEQ tablet Take 20 mEq by mouth 3 (three) times daily.   Yes Historical Provider, MD  PRESCRIPTION MEDICATION Apply 1 application topically at bedtime. Capsacin Cream- to both feet.   Yes Historical Provider, MD  senna (SENOKOT) 8.6 MG tablet Take 2 tablets by mouth daily.   Yes Historical Provider, MD  simvastatin (ZOCOR) 10 MG tablet Take 10 mg by mouth daily at 6 PM.   Yes Historical Provider, MD  spironolactone (ALDACTONE) 12.5 mg TABS tablet Take 0.5 tablets (12.5 mg total) by mouth 2 (two) times daily. 08/20/13  Yes Jerline Pain, MD  traMADol (ULTRAM-ER) 100 MG 24 hr tablet Take 100 mg by mouth at bedtime as needed.    Yes Historical Provider, MD  Vitamin D, Cholecalciferol, 1000 units TABS Take 2,000 Units by mouth daily.   Yes Historical Provider, MD    HOSPITAL MEDICATIONS: I have reviewed the patient's current medications. Marland Kitchen antiseptic oral rinse  7 mL Mouth Rinse q12n4p  . chlorhexidine  15 mL Mouth Rinse BID  . cholecalciferol  2,000 Units Oral Daily  . docusate sodium  100 mg Oral BID  . donepezil  10 mg Oral QHS  . DULoxetine  60 mg Oral Daily  . guaiFENesin  600 mg Oral Daily  . insulin regular  0-10 Units Intravenous TID WC  . lactobacillus  1 g Oral Daily  . loratadine  10 mg Oral Daily  . multivitamin with minerals  1 tablet Oral Daily  . omega-3 acid ethyl esters  1 g Oral Daily  . senna  2  tablet Oral Daily   . sodium chloride    . amiodarone 30 mg/hr (03/31/2016 1200)  . dextrose 5 % and 0.45% NaCl    . insulin (NOVOLIN-R) infusion 4.9 Units/hr (2016-03-31 1509)  .  sodium bicarbonate  infusion 1000 mL     VITALS: Blood pressure 107/32, pulse 62, temperature 98.4 F (36.9 C), temperature source Axillary, resp. rate 15, height 5' 2.5" (1.588 m), weight 254 lb 10.1 oz (115.5 kg), SpO2 91 %.  PHYSICAL EXAM: General appearance: morbidly obese, pale and lethargic, opens eyes to stimulation Neck: no carotid bruit and no JVD Lungs: decreased breath sounds; scattered rhonchi Heart: regular rate and rhythm Abdomen: obes, BS decreased, not distended Extremities: compression device in place Pulses: diminnished Skin: pale, cool, dry Neuro: Unable to examine  LABS: Results for orders placed or performed during the hospital encounter of 03/19/2016 (from the past 24 hour(s))  Hemoglobin and hematocrit, blood     Status: Abnormal   Collection Time: 03/17/2016 10:57 AM  Result Value Ref Range   Hemoglobin 11.0 (L) 12.0 - 15.0 g/dL   HCT 33.9 (L) 36.0 - 46.0 %  Hemoglobin A1c     Status: Abnormal   Collection Time: 03/20/2016 10:57 AM  Result Value Ref Range   Hgb A1c MFr Bld 7.9 (H) 4.8 - 5.6 %   Mean Plasma Glucose 180 mg/dL  Glucose, capillary     Status: Abnormal   Collection Time: 03/24/2016 11:25 AM  Result Value Ref Range   Glucose-Capillary 400 (H) 65 - 99 mg/dL   Comment 1 Notify RN    Comment 2 Document in Chart   Glucose, capillary     Status: Abnormal   Collection Time: 03/21/2016  3:45 PM  Result Value Ref Range   Glucose-Capillary 388 (H) 65 - 99 mg/dL  Hemoglobin and hematocrit, blood     Status: Abnormal   Collection Time: 03/21/2016  6:25 PM  Result Value Ref Range   Hemoglobin 10.3 (L) 12.0 - 15.0 g/dL   HCT 32.0 (L) 36.0 - 46.0 %  Troponin I (q 6hr x 3)     Status: Abnormal   Collection Time: 02/27/2016  6:30 PM  Result Value Ref Range   Troponin I 0.05 (H) <0.031  ng/mL  Renal function panel     Status: Abnormal   Collection Time: 03/10/2016  6:30 PM  Result Value Ref Range   Sodium 133 (L) 135 - 145 mmol/L   Potassium 5.9 (H) 3.5 - 5.1 mmol/L   Chloride 99 (L) 101 - 111 mmol/L   CO2 18 (L) 22 - 32 mmol/L   Glucose, Bld 464 (H) 65 - 99 mg/dL   BUN 39 (H) 6 - 20 mg/dL   Creatinine, Ser 2.63 (H) 0.44 - 1.00 mg/dL   Calcium 8.5 (L) 8.9 - 10.3 mg/dL   Phosphorus 6.9 (H) 2.5 - 4.6 mg/dL   Albumin 3.4 (L) 3.5 - 5.0 g/dL   GFR calc non Af Amer 16 (L) >60 mL/min   GFR calc Af Amer 19 (L) >60 mL/min   Anion gap 16 (H) 5 - 15  Magnesium     Status: None   Collection Time: 03/26/2016  6:30 PM  Result Value Ref Range   Magnesium 2.2 1.7 - 2.4 mg/dL  Blood gas, arterial     Status: Abnormal (Preliminary result)   Collection Time: 03/19/2016  7:47 PM  Result Value Ref Range   FIO2 PENDING    O2 Content PENDING L/min   pH, Arterial 7.267 (L) 7.350 - 7.450   pCO2 arterial 44.1 35.0 - 45.0 mmHg   pO2, Arterial 64.5 (L) 80.0 - 100.0 mmHg   Bicarbonate 19.5 (L) 20.0 - 24.0 mEq/L   TCO2 18.5 0 - 100 mmol/L   Acid-base deficit 6.7 (H) 0.0 - 2.0 mmol/L   O2 Saturation 89.7 %   Patient temperature 98.6    Collection site LEFT RADIAL    Drawn by [id number]    Sample type ARTERIAL DRAW    Allens test (pass/fail) PASS PASS  Glucose, capillary     Status: Abnormal   Collection Time: 03/06/2016  9:23 PM  Result Value Ref Range   Glucose-Capillary 402 (H) 65 - 99 mg/dL  Glucose, capillary     Status: Abnormal   Collection Time: 03/04/16 10:13 PM  Result Value Ref Range   Glucose-Capillary 407 (H) 65 - 99 mg/dL  Hemoglobin and hematocrit, blood     Status: Abnormal   Collection Time: 04-01-2016 12:47 AM  Result Value Ref Range   Hemoglobin 10.3 (L) 12.0 - 15.0 g/dL   HCT 31.8 (L) 36.0 - 46.0 %  Renal function panel     Status: Abnormal   Collection Time: 01-Apr-2016 12:47 AM  Result Value Ref Range   Sodium 133 (L) 135 - 145 mmol/L   Potassium 6.7 (HH) 3.5 - 5.1  mmol/L   Chloride 99 (L) 101 - 111 mmol/L   CO2 24 22 - 32 mmol/L   Glucose, Bld 348 (H) 65 - 99 mg/dL   BUN 44 (H) 6 - 20 mg/dL   Creatinine, Ser 3.14 (H) 0.44 - 1.00 mg/dL   Calcium 8.6 (L) 8.9 - 10.3 mg/dL   Phosphorus 7.1 (H)  2.5 - 4.6 mg/dL   Albumin 3.6 3.5 - 5.0 g/dL   GFR calc non Af Amer 13 (L) >60 mL/min   GFR calc Af Amer 15 (L) >60 mL/min   Anion gap 10 5 - 15  Troponin I (q 6hr x 3)     Status: Abnormal   Collection Time: 03-24-2016 12:47 AM  Result Value Ref Range   Troponin I 0.08 (H) <0.031 ng/mL  Blood gas, arterial     Status: Abnormal   Collection Time: 03/24/2016  4:07 AM  Result Value Ref Range   FIO2 0.36    Delivery systems NASAL CANNULA    pH, Arterial 7.279 (L) 7.350 - 7.450   pCO2 arterial 45.5 (H) 35.0 - 45.0 mmHg   pO2, Arterial 77.1 (L) 80.0 - 100.0 mmHg   Bicarbonate 20.7 20.0 - 24.0 mEq/L   TCO2 19.7 0 - 100 mmol/L   Acid-base deficit 5.4 (H) 0.0 - 2.0 mmol/L   O2 Saturation 94.0 %   Patient temperature 98.6    Collection site LEFT RADIAL    Drawn by [id number]    Sample type ARTERIAL DRAW    Allens test (pass/fail) PASS PASS  Troponin I (q 6hr x 3)     Status: Abnormal   Collection Time: 03-24-2016  6:06 AM  Result Value Ref Range   Troponin I 0.07 (H) <0.031 ng/mL  CBC     Status: Abnormal   Collection Time: March 24, 2016  6:06 AM  Result Value Ref Range   WBC 27.5 (H) 4.0 - 10.5 K/uL   RBC 3.42 (L) 3.87 - 5.11 MIL/uL   Hemoglobin 10.2 (L) 12.0 - 15.0 g/dL   HCT 31.3 (L) 36.0 - 46.0 %   MCV 91.5 78.0 - 100.0 fL   MCH 29.8 26.0 - 34.0 pg   MCHC 32.6 30.0 - 36.0 g/dL   RDW 15.2 11.5 - 15.5 %   Platelets 173 150 - 400 K/uL  Comprehensive metabolic panel     Status: Abnormal   Collection Time: Mar 24, 2016  6:06 AM  Result Value Ref Range   Sodium 134 (L) 135 - 145 mmol/L   Potassium 6.6 (HH) 3.5 - 5.1 mmol/L   Chloride 99 (L) 101 - 111 mmol/L   CO2 23 22 - 32 mmol/L   Glucose, Bld 294 (H) 65 - 99 mg/dL   BUN 49 (H) 6 - 20 mg/dL   Creatinine, Ser  3.54 (H) 0.44 - 1.00 mg/dL   Calcium 8.8 (L) 8.9 - 10.3 mg/dL   Total Protein 6.4 (L) 6.5 - 8.1 g/dL   Albumin 3.5 3.5 - 5.0 g/dL   AST 470 (H) 15 - 41 U/L   ALT 380 (H) 14 - 54 U/L   Alkaline Phosphatase 98 38 - 126 U/L   Total Bilirubin 1.0 0.3 - 1.2 mg/dL   GFR calc non Af Amer 11 (L) >60 mL/min   GFR calc Af Amer 13 (L) >60 mL/min   Anion gap 12 5 - 15  Glucose, capillary     Status: Abnormal   Collection Time: Mar 24, 2016  7:42 AM  Result Value Ref Range   Glucose-Capillary 298 (H) 65 - 99 mg/dL   Comment 1 Notify RN    Comment 2 Document in Chart   Protime-INR     Status: Abnormal   Collection Time: 03-24-16  8:36 AM  Result Value Ref Range   Prothrombin Time 21.7 (H) 11.6 - 15.2 seconds   INR 1.90 (H) 0.00 - 1.49  Glucose, capillary     Status: Abnormal   Collection Time: 2016-03-20  9:36 AM  Result Value Ref Range   Glucose-Capillary 303 (H) 65 - 99 mg/dL    EKG: Paced with LBBB  IMAGING: Dg Chest Port 1 View  03/20/2016  CLINICAL DATA:  CHF.  Shortness of breath EXAM: PORTABLE CHEST 1 VIEW COMPARISON:  Yesterday FINDINGS: Chronic cardiomegaly. The patient is status post aortic valve replacement. Dual-chamber pacer leads from the left. Chronic scarring on the left with linear opacities. Bronchitic or congestive interstitial coarsening. No definitive pleural effusion. No pneumothorax. IMPRESSION: 1. Congestive or bronchitic interstitial coarsening similar to yesterday. 2. Left perihilar atelectasis. Electronically Signed   By: Monte Fantasia M.D.   On: 03/20/2016 06:00   Dg Chest Portable 1 View  03/17/2016  CLINICAL DATA:  Shortness of breath EXAM: PORTABLE CHEST 1 VIEW COMPARISON:  02/12/2014 FINDINGS: Chronic cardiopericardial enlargement. Dual-chamber pacer leads from the left are in stable position. Status post median sternotomy for aortic valve replacement. Prominent mitral valve calcification. Fracturing of upper sternal wires is chronic. Interstitial coarsening above  previous baseline. No effusion or pneumothorax. Stable appearance of the hila. IMPRESSION: 1. COPD with bronchitic markings accentuated by low volumes and atelectasis. 2. Chronic cardiomegaly. Electronically Signed   By: Monte Fantasia M.D.   On: 03/10/2016 06:22   Ct Renal Stone Study  03/13/2016  CLINICAL DATA:  Acute onset of back pain radiating to the abdomen. EXAM: CT ABDOMEN AND PELVIS WITHOUT CONTRAST TECHNIQUE: Multidetector CT imaging of the abdomen and pelvis was performed following the standard protocol without IV contrast. COMPARISON:  Pelvis CT 02/13/2014 FINDINGS: Lower chest and abdominal wall: Bronchial wall thickening seen at the bases. Trace left effusion. Hepatobiliary: Cirrhotic liver without gross mass lesion. Cholecystectomy. Pancreas: Atrophy without acute finding. Spleen: Small volume fluid around the spleen, likely reactive to the neighboring hemorrhage. Adrenals/Urinary Tract:  Negative adrenals. Large subcapsular hematoma compressing the left renal cortex. The hematoma encompasses the entire kidney and measures up to 5 cm in thickness. There is extension into the perinephric spaces dissecting into the left pelvis, sigmoid mesentery and splenorenal ligament. When allowing for clot layering, no definitive mass lesion is seen. No hydronephrosis. Atrophic bilateral kidney. Partially collapsed bladder. Reproductive:No pathologic findings. Stomach/Bowel: No obstruction. Appendectomy. Distal colonic diverticulosis. Vascular/Lymphatic: Extensive atherosclerosis. Flat IVC which may be from intravascular depletion. No mass or adenopathy. Peritoneal: No ascites or pneumoperitoneum. Musculoskeletal: Osteopenia and extensive spondylosis and disc degeneration. No acute finding. Critical Value/emergent results were called by telephone at the time of interpretation on 03/10/2016 at 6:42 am to Dr. Thayer Jew , who verbally acknowledged these results. IMPRESSION: 1. Large left subcapsular and  perinephric hematoma dissecting into the retroperitoneal ligaments and left pelvis. In the absence of trauma, this could be spontaneous or from obscured renal mass. 2. Intravascular depletion with collapsed IVC. 3. Cirrhosis. Electronically Signed   By: Monte Fantasia M.D.   On: 03/04/2016 06:50    IMPRESSION: Principal Problem:   Perinephric hematoma Active Problems:   Hyperkalemia   Wide-complex tachycardia (HCC)   Acute renal injury (HCC)   Acute blood loss anemia   DM (diabetes mellitus) (HCC)   Chronic diastolic heart failure (HCC)   Cardiac pacemaker in situ   Chronic anticoagulation-Eliquis 2.5 MG BID   PAF- CHADs VASc=6. NSR Jan 2017   OBESITY, MORBID   HYPERTENSION, CONTROLLED   S/P CABG x 1 2009   S/P AVR (tissue) 2009   RECOMMENDATION: Suspect her WCT was AF  with LBBB. Continue Amiodarone IV for now (she was on Amiodarone 100 mg BID at SNF), echo ordered. I spoke with pt's daughter. MD to see.   Time Spent Directly with Patient:  43 minutes  Kerin Ransom, [place] beeper 2016-03-28, 10:19 AM    Patient seen and examined. Agree with assessment and plan.  Patient is an 82 year old female who has been followed by Dr. Marlou Porch and Dr. Rayann Heman.  She has a history of atrial fibrillation has been on low-dose amiodarone and due to severe bradycardia a pacemaker was placed in December 2014.  She is status post a bioprosthetic aortic valve replacement 2009.  She has a history of chronic diastolic heart failure, CAD, status post CABG revascularization surgery, hypertension, diabetes mellitus, venous insufficiency, and worsening renal function.  She was found to have a large left subcapsular hematoma compressing her left renal cortex.  Last evening she was noted to have wide-complex tachycardia intermittently by telemetry with heart rate at 140-150.  She was started on intravenous amiodarone with improvement and resolution.  She has chronic left bundle branch block. Since  admission, she has continued to deteriorate and has developed hypotension with anuria and worsening renal function.  Her cardiac rhythm early his pain without recurrent arrhythmia.  Her blood pressure is in the upper [id number] systolic. There has been discussion by the primary team and nephrology and the decision was that they elected not to pursue dialysis.  Patient has been made DO NOT RESUSCITATE status.  She is hyperkalemic on bicarbonate and insulin infusion.  At present would continue IV amiodarone.  Per Dr. Sloan Leiter and family  will not pursue aggressive intervention.   Troy Sine, MD, [facility] March 28, 2016 4:34 PM

## 2016-03-27 NOTE — Consult Note (Signed)
Chief Complaint: Patient was seen in consultation today for renal arteriogram with possible embolization Chief Complaint  Patient presents with  . Back Pain    Referring Physician(s): [place] Physician: Aletta Edouard  Patient Status: In-pt   History of Present Illness: [name] is a 82 y.o. female with significant past medical history including coronary artery disease with prior CABG and AVR, paroxysmal atrial fibrillation on eliquis therapy, left bundle branch block, sick sinus syndrome with pacer present ,CHF, obstructive sleep apnea, Alzheimer's dementia, diabetes, hypertension, anemia, chronic renal failure, morbid obesity, peripheral vascular disease, and depression. She was admitted yesterday with acute onset of severe left flank pain which occured spontaneously and without precipitating or traumatic factors. Subsequent CT of the abdomen and pelvis revealed a large left subcapsular perinephric hematoma dissecting into the retroperitoneal ligaments and left pelvis. Cirrhosis was also noted. IV contrast was not administered secondary to poor renal function. Current labs include CBC 27.5, hemoglobin 10.2, platelets 173k, creatinine 3.54 with  GFR of 11,  potassium 6.6 and mildly elevated troponins. Patient was evaluated by urology with recommendations to stop anticoagulation and obtain serial hgb measurements. Current vital signs include blood pressure 107/32, heart rate 62, respirations 15 ,oxygen saturation  91% on 3 L O2, temp 98.4. Request now received for possible renal arteriogram with embolization if hemodynamics worsen.   Past Medical History  Diagnosis Date  . Coronary artery disease 09/04/08    single vessel CABG at time of AVR   . Aortic stenosis 09/04/08    s/p AVR by Dr Servando Snare (tissue valve)  . Paroxysmal atrial fibrillation (HCC)     remote history of PAF  . LBBB (left bundle branch block)   . CHF (congestive heart failure) (HCC)    diastolic  . OSA (obstructive sleep apnea)   . Alzheimer's dementia   . Diabetes mellitus     type 2  . Hypertension   . Chronic anemia   . Osteoporosis   . Chronic renal failure   . Morbid obesity with BMI of 50.0-59.9, adult ([place]) 03/09/2012  . Peripheral vascular disease ([place])   . Arthritis   . Chronic diastolic heart failure ([place]) 07/20/2013  . Chronic right ear pain     "nerve damage"  . Atrial flutter ([place])   . Anemia in chronic kidney disease   . Major depressive disorder ([place])   . Alopecia   . Benign hypertensive kidney disease with chronic kidney disease stage I through stage IV, or unspecified   . Type II or unspecified type diabetes mellitus with neurological manifestations, not stated as uncontrolled   . Pure hypercholesterolemia   . Coronary atherosclerosis of native coronary artery   . Obesity   . GI bleed 08/2013  . Sick sinus syndrome ([place])     s/p PPM    Past Surgical History  Procedure Laterality Date  . Coronary artery bypass graft  09/04/08    single vessel at time of AVR  . Aortic valve replacement  12/09    tissue valve by Dr Servando Snare  . Cholecystectomy  1999  . Cataract extraction  2002  . Replacement total knee bilateral      L 2000, R 1997  . Appendectomy    . Closed reduction femoral shaft fracture  2007    R intramedullary rod  . Tee without cardioversion N/A 02/07/2013    Procedure: TRANSESOPHAGEAL ECHOCARDIOGRAM (TEE);  Surgeon: Candee Furbish, MD;  Location: [facility];  Service: Cardiovascular;  Laterality: N/A;  . Pacemaker insertion  09/14/2013    MDT Adapta L implanted by Dr Lovena Le for SSS  . Permanent pacemaker insertion N/A 09/14/2013    Procedure: PERMANENT PACEMAKER INSERTION;  Surgeon: Evans Lance, MD;  Location: [facility] CATH LAB;  Service: Cardiovascular;  Laterality: N/A;    Allergies: Morphine and related; Celebrex; Codeine; Oxycodone; Sulfa antibiotics; and Wellbutrin  Medications: Prior to Admission medications   Medication  Sig Start Date End Date Taking? Authorizing Provider  acetaminophen (TYLENOL) 500 MG tablet Take 500 mg by mouth every 8 (eight) hours as needed for mild pain.   Yes Historical Provider, MD  albuterol (PROVENTIL HFA;VENTOLIN HFA) 108 (90 BASE) MCG/ACT inhaler Inhale 2 puffs into the lungs every 6 (six) hours as needed for wheezing. 07/26/13  Yes Belkys A Regalado, MD  amiodarone (PACERONE) 200 MG tablet Take 100 mg by mouth 2 (two) times daily.   Yes Historical Provider, MD  amoxicillin (AMOXIL) 500 MG tablet Take 2,000 mg by mouth as directed. Take 1 hour before dental procedures   Yes Historical Provider, MD  apixaban (ELIQUIS) 5 MG TABS tablet Take 5 mg by mouth 2 (two) times daily.   Yes Historical Provider, MD  cetirizine (ZYRTEC) 10 MG tablet Take 10 mg by mouth daily.   Yes Historical Provider, MD  docusate sodium (COLACE) 100 MG capsule Take 100 mg by mouth 2 (two) times daily.   Yes Historical Provider, MD  donepezil (ARICEPT) 10 MG tablet Take 1 tablet (10 mg total) by mouth at bedtime. 08/11/12  Yes Jacquelyn A McGill, MD  DULoxetine (CYMBALTA) 60 MG capsule Take 60 mg by mouth daily.   Yes Historical Provider, MD  ferrous sulfate 325 (65 FE) MG tablet Take 1 tablet (325 mg total) by mouth 2 (two) times daily. 08/11/12  Yes Jacquelyn A McGill, MD  fish oil-omega-3 fatty acids 1000 MG capsule Take 1 g by mouth daily. 08/04/12  Yes Theodis Blaze, MD  furosemide (LASIX) 80 MG tablet Take 80 mg by mouth 2 (two) times daily.    Yes Historical Provider, MD  gabapentin (NEURONTIN) 600 MG tablet Take 600 mg by mouth 3 (three) times daily.  08/11/12  Yes Jacquelyn A McGill, MD  guaiFENesin (MUCINEX) 600 MG 12 hr tablet Take 600 mg by mouth daily.   Yes Historical Provider, MD  insulin aspart (NOVOLOG) 100 UNIT/ML injection Inject 2-9 Units into the skin 3 (three) times daily with meals. Sliding scale 151-200=2  201-250=3 251-300=5 301-350=7 >350=9   Yes Historical Provider, MD  insulin detemir  (LEVEMIR) 100 UNIT/ML injection Inject 24 Units into the skin at bedtime.   Yes Historical Provider, MD  lactobacillus (FLORANEX/LACTINEX) PACK Take 1 g by mouth daily.   Yes Historical Provider, MD  Melatonin 5 MG TABS Take 5 mg by mouth at bedtime as needed (insomnia).   Yes Historical Provider, MD  Multiple Vitamin (MULTIVITAMIN) tablet Take 1 tablet by mouth daily.    Yes Historical Provider, MD  potassium chloride SA (K-DUR,KLOR-CON) 20 MEQ tablet Take 20 mEq by mouth 3 (three) times daily.   Yes Historical Provider, MD  PRESCRIPTION MEDICATION Apply 1 application topically at bedtime. Capsacin Cream- to both feet.   Yes Historical Provider, MD  senna (SENOKOT) 8.6 MG tablet Take 2 tablets by mouth daily.   Yes Historical Provider, MD  simvastatin (ZOCOR) 10 MG tablet Take 10 mg by mouth daily at 6 PM.   Yes Historical Provider, MD  spironolactone (ALDACTONE) 12.5 mg TABS  tablet Take 0.5 tablets (12.5 mg total) by mouth 2 (two) times daily. 08/20/13  Yes Jake Bathe, MD  traMADol (ULTRAM-ER) 100 MG 24 hr tablet Take 100 mg by mouth at bedtime as needed.    Yes Historical Provider, MD  Vitamin D, Cholecalciferol, 1000 units TABS Take 2,000 Units by mouth daily.   Yes Historical Provider, MD     Family History  Problem Relation Age of Onset  . Heart attack Father 21    multiple MIs before death  . Diabetes Father   . CAD Father   . Lung cancer Mother 41  . Colon cancer Mother   . Hypertension Mother   . Hypertension Sister   . Multiple sclerosis Brother   . Diabetes Sister     Social History   Social History  . Marital Status: Widowed    Spouse Name: N/A  . Number of Children: N/A  . Years of Education: N/A   Occupational History  . retired     lives with her daughter   Social History Main Topics  . Smoking status: Never Smoker   . Smokeless tobacco: Never Used  . Alcohol Use: No  . Drug Use: No  . Sexual Activity: No   Other Topics Concern  . None   Social  History Narrative   Lives in [place] with daughter, [name], who is an Charity fundraiser.     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   Widowed 2003    .  Review of Systems difficult to obtain secondary to lethargic state; nurse reports continued left flank discomfort  Vital Signs: BP 107/32 mmHg  Pulse 62  Temp(Src) 98.4 F (36.9 C) (Axillary)  Resp 15  Ht 5' 2.5" (1.588 m)  Wt 254 lb 10.1 oz (115.5 kg)  BMI 45.80 kg/m2  SpO2 91%  Physical Exam patient lethargic this morning, responds occasionally to questions but not very fluently; chest clear to auscultation in  upper fields, few bibasilar crackles; normal S1, S2, regular, left chest wall pacer; abdomen obese, positive bowel sounds, nontender; left flank tenderness; lower extremities trace edema  Mallampati Score:     Imaging: Dg Chest Port 1 View  03/15/16  CLINICAL DATA:  CHF.  Shortness of breath EXAM: PORTABLE CHEST 1 VIEW COMPARISON:  Yesterday FINDINGS: Chronic cardiomegaly. The patient is status post aortic valve replacement. Dual-chamber pacer leads from the left. Chronic scarring on the left with linear opacities. Bronchitic or congestive interstitial coarsening. No definitive pleural effusion. No pneumothorax. IMPRESSION: 1. Congestive or bronchitic interstitial coarsening similar to yesterday. 2. Left perihilar atelectasis. Electronically Signed   By: Marnee Spring M.D.   On: 03/15/2016 06:00   Dg Chest Portable 1 View  03/16/2016  CLINICAL DATA:  Shortness of breath EXAM: PORTABLE CHEST 1 VIEW COMPARISON:  02/12/2014 FINDINGS: Chronic cardiopericardial enlargement. Dual-chamber pacer leads from the left are in stable position. Status post median sternotomy for aortic valve replacement. Prominent mitral valve calcification. Fracturing of upper sternal wires is chronic. Interstitial coarsening above previous baseline. No effusion or pneumothorax. Stable appearance of the hila. IMPRESSION: 1. COPD with bronchitic  markings accentuated by low volumes and atelectasis. 2. Chronic cardiomegaly. Electronically Signed   By: Marnee Spring M.D.   On: 03/06/2016 06:22   Ct Renal Stone Study  03/16/2016  CLINICAL DATA:  Acute onset of back pain radiating to the abdomen. EXAM: CT ABDOMEN AND PELVIS WITHOUT CONTRAST TECHNIQUE: Multidetector CT imaging of the abdomen and pelvis was  performed following the standard protocol without IV contrast. COMPARISON:  Pelvis CT 02/13/2014 FINDINGS: Lower chest and abdominal wall: Bronchial wall thickening seen at the bases. Trace left effusion. Hepatobiliary: Cirrhotic liver without gross mass lesion. Cholecystectomy. Pancreas: Atrophy without acute finding. Spleen: Small volume fluid around the spleen, likely reactive to the neighboring hemorrhage. Adrenals/Urinary Tract:  Negative adrenals. Large subcapsular hematoma compressing the left renal cortex. The hematoma encompasses the entire kidney and measures up to 5 cm in thickness. There is extension into the perinephric spaces dissecting into the left pelvis, sigmoid mesentery and splenorenal ligament. When allowing for clot layering, no definitive mass lesion is seen. No hydronephrosis. Atrophic bilateral kidney. Partially collapsed bladder. Reproductive:No pathologic findings. Stomach/Bowel: No obstruction. Appendectomy. Distal colonic diverticulosis. Vascular/Lymphatic: Extensive atherosclerosis. Flat IVC which may be from intravascular depletion. No mass or adenopathy. Peritoneal: No ascites or pneumoperitoneum. Musculoskeletal: Osteopenia and extensive spondylosis and disc degeneration. No acute finding. Critical Value/emergent results were called by telephone at the time of interpretation on 03/25/2016 at 6:42 am to Dr. Thayer Jew , who verbally acknowledged these results. IMPRESSION: 1. Large left subcapsular and perinephric hematoma dissecting into the retroperitoneal ligaments and left pelvis. In the absence of trauma, this could be  spontaneous or from obscured renal mass. 2. Intravascular depletion with collapsed IVC. 3. Cirrhosis. Electronically Signed   By: Monte Fantasia M.D.   On: 03/17/2016 06:50    Labs:  CBC:  Recent Labs  10/20/15 1508 03/17/2016 0459 03/03/2016 1057 03/12/2016 1825 2016-03-26 0047 2016/03/26 0606  WBC 5.5 18.5*  --   --   --  27.5*  HGB 12.9 11.9* 11.0* 10.3* 10.3* 10.2*  HCT 39.2 35.6* 33.9* 32.0* 31.8* 31.3*  PLT 112* 120*  --   --   --  173    COAGS:  Recent Labs  03-26-16 0836  INR 1.90*    BMP:  Recent Labs  03/09/2016 0459 02/28/2016 1830 03-26-16 0047 03/26/16 0606  NA 135 133* 133* 134*  K 4.4 5.9* 6.7* 6.6*  CL 101 99* 99* 99*  CO2 25 18* 24 23  GLUCOSE 330* 464* 348* 294*  BUN 30* 39* 44* 49*  CALCIUM 8.7* 8.5* 8.6* 8.8*  CREATININE 1.59* 2.63* 3.14* 3.54*  GFRNONAA 30* 16* 13* 11*  GFRAA 34* 19* 15* 13*    LIVER FUNCTION TESTS:  Recent Labs  10/20/15 1508 03/24/2016 0459 03/18/2016 1830 03-26-2016 0047 03/26/2016 0606  BILITOT 0.8 0.8  --   --  1.0  AST 21 26  --   --  470*  ALT 12 16  --   --  380*  ALKPHOS 94 90  --   --  98  PROT 6.2 6.7  --   --  6.4*  ALBUMIN 3.6 3.5 3.4* 3.6 3.5    TUMOR MARKERS: No results for input(s): AFPTM, CEA, CA199, CHROMGRNA in the last 8760 hours.  Assessment and Plan: 82 y.o. female with significant past medical history including coronary artery disease with prior CABG and AVR, paroxysmal atrial fibrillation on eliquis therapy, left bundle branch block, sick sinus syndrome with pacer present ,CHF, obstructive sleep apnea, Alzheimer's dementia, diabetes, hypertension, anemia, chronic renal failure, morbid obesity, peripheral vascular disease, and depression. She was admitted yesterday with acute onset of severe left flank pain which occured spontaneously and without precipitating or traumatic factors. Subsequent CT of the abdomen and pelvis revealed a large left subcapsular perinephric hematoma dissecting into the  retroperitoneal ligaments and left pelvis. Cirrhosis was also noted. IV contrast  was not administered secondary to poor renal function. Current labs include CBC 27.5, hemoglobin 10.2, platelets 173k, creatinine 3.54 with  GFR of 11,  potassium 6.6, PT 21.7/INR 1.9  and mildly elevated troponins. Patient was evaluated by urology with recommendations to stop anticoagulation and obtain serial hgb measurements. Vital signs include blood pressure 107/32, heart rate 62, respirations 15, oxygen saturation  91% on 3 L O2, temp 98.4. Request now received for possible renal arteriogram with embolization if hemodynamics worsen. Imaging studies and clinical history were reviewed by Dr. Kathlene Cote. Agree with urology regarding continued close monitoring of hemoglobin/renal function, holding anticoagulation and frequent vital sign checks. Current hemoglobin values are stable and patient has not received any transfusions. She has minimal urine output but no hematuria. Correct hyperkalemia. Consider nephrology consult as well.  Patient would be at significant risk for worsening renal function if arteriography performed . We will continue to monitor pt status and if clinical condition worsens can consider arteriography with possible embolization.     Thank you for this interesting consult.  I greatly enjoyed [place] and look forward to participating in their care.  A copy of this report was sent to the requesting provider on this date.  Electronically Signed: D. Rowe Robert 2016/03/21, 9:53 AM  I spent a total of 30 minutes  in face to face in clinical consultation, greater than 50% of which was counseling/coordinating care for renal arteriogram with possible embolization

## 2016-03-27 NOTE — Progress Notes (Signed)
Chaplain paged at [facility] just after [name] died. Family was gathering to say their belated good-byes. Family provided comfort to one another and grieved appropriately. [name] will be remembered for her loving kindnesses, and her unconditional love for all she met. She will be sorely missed. Family pastor was alerted to the death and their faith community will provide spiritual and physical comfort in the days ahead.  [name]. [name], [place]

## 2016-03-27 NOTE — Progress Notes (Signed)
Patient ID: [name], female   DOB: 09-Jun-1933, 82 y.o.   MRN: [id number]    Subjective: Pt's overall clinical situation has deteriorated. She is on amiodorone for arrhythmia and developed hypotension and anuria with worsening renal function.  She has had continued left flank pain as expected although better this afternoon.    Objective: Vital signs in last 24 hours: Temp:  [97.4 F (36.3 C)-98.6 F (37 C)] 98.4 F (36.9 C) (06/09 1600) Pulse Rate:  [58-73] 64 (06/09 1500) Resp:  [12-24] 21 (06/09 1500) BP: (56-138)/(14-107) 138/33 mmHg (06/09 1500) SpO2:  [91 %-100 %] 94 % (06/09 1500)  Intake/Output from previous day: 06/08 0701 - 06/09 0700 In: 1904.6 [I.V.:1154.6; IV Piggyback:250] Out: 100 [Urine:100] Intake/Output this shift: Total I/O In: 546.3 [I.V.:546.3] Out: 5 [Urine:5]  Physical Exam:  Abdomen: Soft, ND   Lab Results:  Recent Labs  03/03/2016 1825 2016-03-20 0047 March 20, 2016 0606  HGB 10.3* 10.3* 10.2*  HCT 32.0* 31.8* 31.3*   BMET  Recent Labs  March 20, 2016 0606 03/20/2016 1301  NA 134* 133*  K 6.6* 6.3*  CL 99* 98*  CO2 23 22  GLUCOSE 294* 290*  BUN 49* 58*  CREATININE 3.54* 3.89*  CALCIUM 8.8* 8.7*     Studies/Results:  Assessment/Plan: 1. Left perinephric hematoma: Her Hgb remains stable.  I would not recommend any intervention for her hematoma considering the stability of her Hgb.  Any intervention such as embolization would not only risk further impairment of her renal function but would be risky with an IV contrast load as well.  Continue conservative management and hold anticoagulation.   LOS: 1 day   Treson Laura,LES 03-20-16, 4:14 PM

## 2016-03-27 NOTE — Consult Note (Signed)
Consultation Note Date: 04/04/16   Patient Name: [name]  DOB: 08/10/1933  MRN: [id number]  Age / Sex: 82 y.o., female  PCP: Wenda Low, MD Referring Physician: Jonetta Osgood, MD  Reason for Consultation: Establishing goals of care  HPI/Patient Profile: 82 y.o. female  with past medical history of CABG, Chronic diastolic heart failure, atrial fibrillation on anticoagulation, bioprosthetic aortic valve, admitted on 03/11/2016 with large left subscapular hematoma, renal failure, and A. Fib.  Palliative consulted for goals of care.   Clinical Assessment and Goals of Care:  I met today with patient's family including her daughter, son-in-law, sister, and granddaughter.  We talked about what is most important to [name]. Her daughter reports that she has always been very involved in her family. 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 She is always been independent and a matriarch to the rest the family.  They report that the physicians have been doing a good job explaining things to them. Her daughter is able to relate clearly her complex situation including hematoma, renal failure, and arrhythmia. She reports seeing a distinct change in her condition from yesterday to today.  Moving forward, the family affirms that the main goal is to ensure the [name] is comfortable if she is approaching end-of-life. They would like to continue with current care to see how she responds over the next 24 hours. If, however, her condition deteriorates, the family is clear that the focus should be on her comfort moving forward. They have discussed and are clear that she would not want to pursue dialysis.  We discussed medications for comfort including pain, shortness of breath, and anxiety. I talked with family about the risk versus benefit of these medications  including hypotension and respiratory depression. They are in agreement that if she becomes symptomatic and requires medication, there should not be hesitation to give medications based upon concern for hypotension or respiratory depression due to the fact that the #1 priority should be on her remaining comfortable.  SUMMARY OF RECOMMENDATIONS   - Family affirms decision to continue with current measures but in the event of any deterioration they would like to ensure that the focus is placed on her comfort. - She is DO NOT RESUSCITATE/DO NOT INTUBATE - We discussed medications for symptom management including pain, shortness of breath, and anxiety.  Specifically discussed that these medications could contribute to hypotension and respiratory depression and family is in agreement that if these stymptoms become an issue, focus should be on her comfort as number one priority and medications should not be held due to concern of possible unintended side effects. - We'll plan to reassess tomorrow.   Code Status/Advance Care Planning:  DNR   Symptom Management:   She currently denies symptoms. Her family is concerned about making sure she does not suffer if her condition worsens.  Pain/shortness of breath: Currently not an issue. If she becomes symptomatic we'll plan for fentanyl as needed  Anxiety:  Originally plan for Ativan. Received call from pharmacy recommending Versed rather than Ativan due to renal failure. Plan for Versed Q2 hr prn.   Palliative Prophylaxis:   Aspiration, Bowel Regimen, Delirium Protocol and Frequent Pain Assessment  Additional Recommendations (Limitations, Scope, Preferences):  Continue with the current therapy. If her condition were to deteriorate, focus should be on comfort  Psycho-social/Spiritual:   Desire for further Chaplaincy support: Did not address this visit  Additional Recommendations: Caregiving  Support/Resources  Prognosis:   Unable to determine,  however, her prognosis remains guarded and she is at high risk for acute decompensation and death  Discharge Planning: To Be Determined     Primary Diagnoses: Present on Admission:  . Perinephric hematoma . Acute renal injury ([place]) . Acute blood loss anemia . Chronic diastolic heart failure ([place]) . Hyperkalemia . OBESITY, MORBID . HYPERTENSION, CONTROLLED . Cardiac pacemaker in situ . PAF- CHADs VASc=6. NSR Jan 2017  I have reviewed the medical record, interviewed the patient and family, and examined the patient. The following aspects are pertinent.  Past Medical History  Diagnosis Date  . Coronary artery disease 09/04/08    single vessel CABG at time of AVR   . Aortic stenosis 09/04/08    s/p AVR by Dr Servando Snare (tissue valve)  . Paroxysmal atrial fibrillation (HCC)     remote history of PAF  . LBBB (left bundle branch block)   . CHF (congestive heart failure) (HCC)     diastolic  . OSA (obstructive sleep apnea)   . Alzheimer's dementia   . Diabetes mellitus     type 2  . Hypertension   . Chronic anemia   . Osteoporosis   . Chronic renal failure   . Morbid obesity with BMI of 50.0-59.9, adult ([place]) 03/09/2012  . Peripheral vascular disease ([place])   . Arthritis   . Chronic diastolic heart failure ([place]) 07/20/2013  . Chronic right ear pain     "nerve damage"  . Atrial flutter ([place])   . Anemia in chronic kidney disease   . Major depressive disorder ([place])   . Alopecia   . Benign hypertensive kidney disease with chronic kidney disease stage I through stage IV, or unspecified   . Type II or unspecified type diabetes mellitus with neurological manifestations, not stated as uncontrolled   . Pure hypercholesterolemia   . Coronary atherosclerosis of native coronary artery   . Obesity   . GI bleed 08/2013  . Sick sinus syndrome [facility])     s/p PPM   Social History   Social History  . Marital Status: Widowed    Spouse Name: N/A  . Number of Children: N/A  . Years of  Education: N/A   Occupational History  . retired     lives with her daughter   Social History Main Topics  . Smoking status: Never Smoker   . Smokeless tobacco: Never Used  . Alcohol Use: No  . Drug Use: No  . Sexual Activity: No   Other Topics Concern  . None   Social History Narrative   Lives in [place] with daughter, Lenna Sciara, who is an Therapist, sports.     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   Widowed 2003   Family History  Problem Relation Age of Onset  . Heart attack Father 81    multiple MIs before death  . Diabetes Father   . CAD Father   . Lung cancer Mother 72  . Colon cancer  Mother   . Hypertension Mother   . Hypertension Sister   . Multiple sclerosis Brother   . Diabetes Sister    Scheduled Meds: . antiseptic oral rinse  7 mL Mouth Rinse q12n4p  . chlorhexidine  15 mL Mouth Rinse BID  . cholecalciferol  2,000 Units Oral Daily  . docusate sodium  100 mg Oral BID  . donepezil  10 mg Oral QHS  . DULoxetine  60 mg Oral Daily  . guaiFENesin  600 mg Oral Daily  . insulin regular  0-10 Units Intravenous TID WC  . lactobacillus  1 g Oral Daily  . loratadine  10 mg Oral Daily  . multivitamin with minerals  1 tablet Oral Daily  . omega-3 acid ethyl esters  1 g Oral Daily  . senna  2 tablet Oral Daily   Continuous Infusions: . sodium chloride    . amiodarone 30 mg/hr (03/07/16 1200)  . dextrose 5 % and 0.45% NaCl    . insulin (NOVOLIN-R) infusion 4.9 Units/hr (2016-03-07 1509)  .  sodium bicarbonate  infusion 1000 mL 125 mL/hr at 03/07/2016 1721   PRN Meds:.acetaminophen, albuterol, dextrose, fentaNYL (SUBLIMAZE) injection, midazolam Medications Prior to Admission:  Prior to Admission medications   Medication Sig Start Date End Date Taking? Authorizing Provider  acetaminophen (TYLENOL) 500 MG tablet Take 500 mg by mouth every 8 (eight) hours as needed for mild pain.   Yes Historical Provider, MD  albuterol (PROVENTIL HFA;VENTOLIN HFA) 108 (90 BASE)  MCG/ACT inhaler Inhale 2 puffs into the lungs every 6 (six) hours as needed for wheezing. 07/26/13  Yes Belkys A Regalado, MD  amiodarone (PACERONE) 200 MG tablet Take 100 mg by mouth 2 (two) times daily.   Yes Historical Provider, MD  amoxicillin (AMOXIL) 500 MG tablet Take 2,000 mg by mouth as directed. Take 1 hour before dental procedures   Yes Historical Provider, MD  apixaban (ELIQUIS) 5 MG TABS tablet Take 5 mg by mouth 2 (two) times daily.   Yes Historical Provider, MD  cetirizine (ZYRTEC) 10 MG tablet Take 10 mg by mouth daily.   Yes Historical Provider, MD  docusate sodium (COLACE) 100 MG capsule Take 100 mg by mouth 2 (two) times daily.   Yes Historical Provider, MD  donepezil (ARICEPT) 10 MG tablet Take 1 tablet (10 mg total) by mouth at bedtime. 08/11/12  Yes Jacquelyn A McGill, MD  DULoxetine (CYMBALTA) 60 MG capsule Take 60 mg by mouth daily.   Yes Historical Provider, MD  ferrous sulfate 325 (65 FE) MG tablet Take 1 tablet (325 mg total) by mouth 2 (two) times daily. 08/11/12  Yes Jacquelyn A McGill, MD  fish oil-omega-3 fatty acids 1000 MG capsule Take 1 g by mouth daily. 08/04/12  Yes Theodis Blaze, MD  furosemide (LASIX) 80 MG tablet Take 80 mg by mouth 2 (two) times daily.    Yes Historical Provider, MD  gabapentin (NEURONTIN) 600 MG tablet Take 600 mg by mouth 3 (three) times daily.  08/11/12  Yes Jacquelyn A McGill, MD  guaiFENesin (MUCINEX) 600 MG 12 hr tablet Take 600 mg by mouth daily.   Yes Historical Provider, MD  insulin aspart (NOVOLOG) 100 UNIT/ML injection Inject 2-9 Units into the skin 3 (three) times daily with meals. Sliding scale 151-200=2  201-250=3 251-300=5 301-350=7 >350=9   Yes Historical Provider, MD  insulin detemir (LEVEMIR) 100 UNIT/ML injection Inject 24 Units into the skin at bedtime.   Yes Historical Provider, MD  lactobacillus (FLORANEX/LACTINEX) PACK Take  1 g by mouth daily.   Yes Historical Provider, MD  Melatonin 5 MG TABS Take 5 mg by mouth at  bedtime as needed (insomnia).   Yes Historical Provider, MD  Multiple Vitamin (MULTIVITAMIN) tablet Take 1 tablet by mouth daily.    Yes Historical Provider, MD  potassium chloride SA (K-DUR,KLOR-CON) 20 MEQ tablet Take 20 mEq by mouth 3 (three) times daily.   Yes Historical Provider, MD  PRESCRIPTION MEDICATION Apply 1 application topically at bedtime. Capsacin Cream- to both feet.   Yes Historical Provider, MD  senna (SENOKOT) 8.6 MG tablet Take 2 tablets by mouth daily.   Yes Historical Provider, MD  simvastatin (ZOCOR) 10 MG tablet Take 10 mg by mouth daily at 6 PM.   Yes Historical Provider, MD  spironolactone (ALDACTONE) 12.5 mg TABS tablet Take 0.5 tablets (12.5 mg total) by mouth 2 (two) times daily. 08/20/13  Yes Jerline Pain, MD  traMADol (ULTRAM-ER) 100 MG 24 hr tablet Take 100 mg by mouth at bedtime as needed.    Yes Historical Provider, MD  Vitamin D, Cholecalciferol, 1000 units TABS Take 2,000 Units by mouth daily.   Yes Historical Provider, MD   Allergies  Allergen Reactions  . Morphine And Related Other (See Comments)    "went crazy"-Hallucinations  . Celebrex [Celecoxib] Other (See Comments)    Sore mouth  . Codeine Other (See Comments)    Altered mental status  . Oxycodone Other (See Comments)    Altered mental status  . Sulfa Antibiotics Itching    Itching all over body  . Wellbutrin [Bupropion] Other (See Comments)    Hallucinations   Review of Systems  Unable to obtain. Patient does not participate when asked questions.  Physical Exam  General: Ill-appearing female. Lying in bed. Face mask in place. Opens eyes to tactile stimulation and occasional 1 word answers but does not really participate in conversation.  HEENT: No bruits, no goiter, no JVD Heart: Regular rate and rhythm. No murmur appreciated. Lungs: Fair air movement, clear with anterior auscultation other than rales in bases Abdomen: Soft, nontender, nondistended, positive bowel sounds.  Ext: Lower  extremity edema Skin: Warm and dry  Vital Signs: BP 138/33 mmHg  Pulse 64  Temp(Src) 98.4 F (36.9 C) (Oral)  Resp 21  Ht 5' 2.5" (1.588 m)  Wt 115.5 kg (254 lb 10.1 oz)  BMI 45.80 kg/m2  SpO2 94% Pain Assessment: Faces   Pain Score: 4    SpO2: SpO2: 94 % O2 Device:SpO2: 94 % O2 Flow Rate: .O2 Flow Rate (L/min): 3 L/min  IO: Intake/output summary:  Intake/Output Summary (Last 24 hours) at 03-24-16 1809 Last data filed at 2016-03-24 1509  Gross per 24 hour  Intake 2450.92 ml  Output      5 ml  Net 2445.92 ml    LBM: Last BM Date: 03/03/16 Baseline Weight: Weight: 115.5 kg (254 lb 10.1 oz) Most recent weight: Weight: 115.5 kg (254 lb 10.1 oz)     Palliative Assessment/Data: PPS 20%   Flowsheet Rows        Most Recent Value   Intake Tab    Referral Department  Hospitalist   Unit at Time of Referral  ICU   Palliative Care Primary Diagnosis  Nephrology   Date Notified  03-24-2016   Palliative Care Type  New Palliative care   Date of Admission  03/11/2016   Date first seen by Palliative Care  03-24-2016   # of days Palliative referral response time  0 Day(s)   # of days IP prior to Palliative referral  1   Clinical Assessment    Palliative Performance Scale Score  20%   Pain Max last 24 hours  Not able to report   Pain Min Last 24 hours  Not able to report   Psychosocial & Spiritual Assessment    Palliative Care Outcomes    Patient/Family meeting held?  Yes   Who was at the meeting?  Daughter, Lenna Sciara, son-in-law, sister, and granddaughter   Palliative Care Outcomes  Clarified goals of care, Improved non-pain symptom therapy, Improved pain interventions   Patient/Family wishes: Interventions discontinued/not started   Hemodialysis, Mechanical Ventilation, Vasopressors, Tube feedings/TPN, NIPPV, Trach, PEG      Time In: 1635 Time Out: 1740 Time Total: 65 Greater than 50%  of this time was spent counseling and coordinating care related to the above assessment and  plan.  Signed by: Micheline Rough, MD   Please contact Palliative Medicine Team phone at [phone] for questions and concerns.  For individual provider: See Shea Evans

## 2016-03-27 DEATH — deceased

## 2016-09-04 ENCOUNTER — Other Ambulatory Visit: Payer: Self-pay | Admitting: Nurse Practitioner

## 2022-04-06 ENCOUNTER — Telehealth: Payer: Self-pay | Admitting: Physician Assistant
